# Patient Record
Sex: Female | Born: 1985 | Race: White | Hispanic: No | Marital: Married | Smoking: Current every day smoker
Health system: Southern US, Community
[De-identification: ages and names within clinical notes are randomized; demographics above are authoritative.]

## PROBLEM LIST (undated history)

## (undated) DIAGNOSIS — K589 Irritable bowel syndrome without diarrhea: Secondary | ICD-10-CM

## (undated) DIAGNOSIS — O4593 Premature separation of placenta, unspecified, third trimester: Secondary | ICD-10-CM

## (undated) DIAGNOSIS — O24419 Gestational diabetes mellitus in pregnancy, unspecified control: Secondary | ICD-10-CM

## (undated) DIAGNOSIS — O99345 Other mental disorders complicating the puerperium: Secondary | ICD-10-CM

## (undated) DIAGNOSIS — I4891 Unspecified atrial fibrillation: Secondary | ICD-10-CM

## (undated) DIAGNOSIS — R609 Edema, unspecified: Secondary | ICD-10-CM

## (undated) DIAGNOSIS — M779 Enthesopathy, unspecified: Secondary | ICD-10-CM

## (undated) DIAGNOSIS — S22009A Unspecified fracture of unspecified thoracic vertebra, initial encounter for closed fracture: Secondary | ICD-10-CM

## (undated) DIAGNOSIS — IMO0002 Reserved for concepts with insufficient information to code with codable children: Secondary | ICD-10-CM

## (undated) DIAGNOSIS — I83892 Varicose veins of left lower extremities with other complications: Secondary | ICD-10-CM

## (undated) DIAGNOSIS — O24410 Gestational diabetes mellitus in pregnancy, diet controlled: Secondary | ICD-10-CM

## (undated) DIAGNOSIS — F419 Anxiety disorder, unspecified: Secondary | ICD-10-CM

## (undated) DIAGNOSIS — G4730 Sleep apnea, unspecified: Secondary | ICD-10-CM

## (undated) DIAGNOSIS — F53 Postpartum depression: Secondary | ICD-10-CM

## (undated) DIAGNOSIS — F410 Panic disorder [episodic paroxysmal anxiety] without agoraphobia: Secondary | ICD-10-CM

## (undated) DIAGNOSIS — G8929 Other chronic pain: Secondary | ICD-10-CM

## (undated) DIAGNOSIS — D3500 Benign neoplasm of unspecified adrenal gland: Secondary | ICD-10-CM

## (undated) DIAGNOSIS — G5603 Carpal tunnel syndrome, bilateral upper limbs: Secondary | ICD-10-CM

## (undated) DIAGNOSIS — R079 Chest pain, unspecified: Secondary | ICD-10-CM

## (undated) DIAGNOSIS — G5600 Carpal tunnel syndrome, unspecified upper limb: Secondary | ICD-10-CM

## (undated) DIAGNOSIS — M549 Dorsalgia, unspecified: Secondary | ICD-10-CM

## (undated) DIAGNOSIS — M479 Spondylosis, unspecified: Secondary | ICD-10-CM

## (undated) DIAGNOSIS — I10 Essential (primary) hypertension: Secondary | ICD-10-CM

## (undated) HISTORY — DX: Other chronic pain: G89.29

## (undated) HISTORY — DX: Panic disorder (episodic paroxysmal anxiety): F41.0

## (undated) HISTORY — DX: Other mental disorders complicating the puerperium: O99.345

## (undated) HISTORY — DX: Unspecified atrial fibrillation: I48.91

## (undated) HISTORY — DX: Benign neoplasm of unspecified adrenal gland: D35.00

## (undated) HISTORY — DX: Sleep apnea, unspecified: G47.30

## (undated) HISTORY — DX: Gestational diabetes mellitus in pregnancy, unspecified control: O24.419

## (undated) HISTORY — DX: Irritable bowel syndrome, unspecified: K58.9

## (undated) HISTORY — PX: CHOLECYSTECTOMY: SHX55

## (undated) HISTORY — DX: Enthesopathy, unspecified: M77.9

## (undated) HISTORY — DX: Carpal tunnel syndrome, bilateral upper limbs: G56.03

## (undated) HISTORY — DX: Postpartum depression: F53.0

## (undated) HISTORY — DX: Carpal tunnel syndrome, unspecified upper limb: G56.00

## (undated) HISTORY — DX: Anxiety disorder, unspecified: F41.9

## (undated) HISTORY — DX: Dorsalgia, unspecified: M54.9

## (undated) HISTORY — DX: Chest pain, unspecified: R07.9

---
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
## 2006-12-27 ENCOUNTER — Emergency Department (HOSPITAL_COMMUNITY): Admission: EM | Admit: 2006-12-27 | Discharge: 2006-12-27 | Payer: Self-pay | Admitting: Emergency Medicine

## 2007-08-07 ENCOUNTER — Emergency Department (HOSPITAL_COMMUNITY): Admission: EM | Admit: 2007-08-07 | Discharge: 2007-08-07 | Payer: Self-pay | Admitting: Emergency Medicine

## 2007-10-11 ENCOUNTER — Emergency Department (HOSPITAL_COMMUNITY): Admission: EM | Admit: 2007-10-11 | Discharge: 2007-10-11 | Payer: Self-pay | Admitting: Emergency Medicine

## 2007-12-11 ENCOUNTER — Emergency Department (HOSPITAL_COMMUNITY): Admission: EM | Admit: 2007-12-11 | Discharge: 2007-12-11 | Payer: Self-pay | Admitting: Emergency Medicine

## 2008-05-02 ENCOUNTER — Emergency Department (HOSPITAL_COMMUNITY): Admission: EM | Admit: 2008-05-02 | Discharge: 2008-05-02 | Payer: Self-pay | Admitting: Emergency Medicine

## 2008-09-03 ENCOUNTER — Emergency Department (HOSPITAL_COMMUNITY): Admission: EM | Admit: 2008-09-03 | Discharge: 2008-09-03 | Payer: Self-pay | Admitting: Emergency Medicine

## 2009-07-08 ENCOUNTER — Emergency Department (HOSPITAL_COMMUNITY): Admission: EM | Admit: 2009-07-08 | Discharge: 2009-07-08 | Payer: Self-pay | Admitting: Emergency Medicine

## 2009-08-03 ENCOUNTER — Ambulatory Visit (HOSPITAL_COMMUNITY): Admission: RE | Admit: 2009-08-03 | Discharge: 2009-08-03 | Payer: Self-pay | Admitting: General Surgery

## 2009-08-03 ENCOUNTER — Encounter (INDEPENDENT_AMBULATORY_CARE_PROVIDER_SITE_OTHER): Payer: Self-pay | Admitting: General Surgery

## 2011-04-05 LAB — BASIC METABOLIC PANEL
BUN: 9 mg/dL (ref 6–23)
CO2: 30 mEq/L (ref 19–32)
GFR calc non Af Amer: >60 mL/min (ref >60)
Glucose, Bld: 96 mg/dL (ref 70–99)
Potassium: 4.2 mEq/L (ref 3.5–5.1)

## 2011-04-05 LAB — CBC
HCT: 38 % (ref 36.0–46.0)
Hemoglobin: 13 g/dL (ref 12.0–15.0)
MCHC: 34.3 g/dL (ref 30.0–36.0)
Platelets: 179 10*3/uL (ref 150–400)
RDW: 13.6 % (ref 11.5–15.5)

## 2011-04-06 LAB — URINALYSIS, ROUTINE W REFLEX MICROSCOPIC
Bilirubin Urine: NEGATIVE
Glucose, UA: NEGATIVE mg/dL
Hgb urine dipstick: NEGATIVE
Ketones, ur: NEGATIVE mg/dL
Protein, ur: NEGATIVE mg/dL
pH: 6 (ref 5.0–8.0)

## 2011-04-06 LAB — DIFFERENTIAL
Basophils Absolute: 0 10*3/uL (ref 0.0–0.1)
Basophils Relative: 1 % (ref 0–1)
Lymphocytes Relative: 25 % (ref 12–46)
Monocytes Absolute: 0.5 10*3/uL (ref 0.1–1.0)
Monocytes Relative: 5 % (ref 3–12)
Neutro Abs: 7.3 10*3/uL (ref 1.7–7.7)
Neutrophils Relative %: 67 % (ref 43–77)

## 2011-04-06 LAB — COMPREHENSIVE METABOLIC PANEL
Albumin: 3.6 g/dL (ref 3.5–5.2)
Alkaline Phosphatase: 76 U/L (ref 39–117)
BUN: 8 mg/dL (ref 6–23)
Creatinine, Ser: 0.67 mg/dL (ref 0.4–1.2)
Glucose, Bld: 91 mg/dL (ref 70–99)
Potassium: 4.3 mEq/L (ref 3.5–5.1)
Total Protein: 6.9 g/dL (ref 6.0–8.3)

## 2011-04-06 LAB — CBC
HCT: 38.7 % (ref 36.0–46.0)
Hemoglobin: 13.2 g/dL (ref 12.0–15.0)
MCHC: 34.2 g/dL (ref 30.0–36.0)
MCV: 85 fL (ref 78.0–100.0)
Platelets: 184 10*3/uL (ref 150–400)
RDW: 13.4 % (ref 11.5–15.5)

## 2011-05-13 NOTE — Op Note (Signed)
NAME:  Heidi Landry, Heidi Landry             ACCOUNT NO.:  [id number]   MEDICAL RECORD NO.:  [id number]          PATIENT TYPE:  AMB   LOCATION:  DAY                           FACILITY:  [facility]   PHYSICIAN:  Chelsea Primus, MD      DATE OF BIRTH:  December 20, 1986   DATE OF PROCEDURE:  08/03/2009  DATE OF DISCHARGE:                               OPERATIVE REPORT   PREOPERATIVE DIAGNOSIS:  Cholelithiasis.   POSTOPERATIVE DIAGNOSES:  Cholelithiasis.   PROCEDURE:  Laparoscopic cholecystectomy.   SURGEON:  Chelsea Primus, MD   ANESTHESIA:  General endotracheal local anesthetic 0.5% Sensorcaine  plain.   SPECIMEN:  Gallbladder.   ESTIMATED BLOOD LOSS:  Minimal.   INDICATIONS:  The patient is a 25 year old female who presents to my  office with history of epigastric abdominal pain.  She had pre-  evaluation workup with a right upper quadrant ultrasound demonstrating  cholelithiasis.  Her signs and symptoms were consistent with a biliary  etiology.  Risks, benefits, and alternatives of a laparoscopic possible  open cholecystectomy were discussed at length with the patient including  but not limited to the risk of bleeding, infection, bile leak, small  bowel injury, common bile duct injury, as well as the possibility of  intraoperative cardiac and pulmonary events.  The patient's questions  and concerns were addressed, and the patient consented for the planned  procedure.   OPERATION:  The patient was taken to the operating room, was placed in  supine position on the operating room table, at which time the general  anesthetic was administered.  Once the patient was asleep, she was  endotracheally intubated by Anesthesia.  At this point, her abdomen was  prepped with DuraPrep solution and draped in a standard fashion.  A stab  incision was created supraumbilically with an 63-FHLKT scalpel.  Additional dissection down to the subcuticular tissue was carried out  using a Kocher clamp which was utilized to  grasp the anterior abdominal  fascia and lifted this anteriorly.  A Veress needle was inserted.  Saline drop test was utilized to confirm intraperitoneal placement, and  then pneumoperitoneum was initiated.  Once sufficient pneumoperitoneum  was obtained, an 11-mm trocar was inserted over the laparoscope allowing  visualization of the trocar entering into the peritoneal cavity.  At  this time, the inner cannula removed.  The laparoscope was reinserted.  There was no evidence of any trocar or Veress needle placement injury.  At this time, the remaining trocars were placed with an 11-mm trocar in  the epigastrium, a 5-mm trocar in the midline between two 11-mm trocars  and a 5-mm trocar in the right lateral abdominal wall.  The patient was  placed into a reverse Trendelenburg left lateral decubitus position.  Fundus of the gallbladder was identified, grasped, and lifted up and  over the right lobe of the liver.  Peritoneal dissection was carried out  using a [place] to strip the peritoneum off the infundibulum  exposing the cystic duct and the cystic arteries and turning to the  infundibulum.  A window was created behind both structures.  Two  EndoClips were placed proximally on the cystic artery, one distally and  on the cystic duct.  Three EndoClips were placed proximally, one  distally in the cystic duct, and cystic artery divided between 2 most  distal clips.  At this time, electrocautery was utilized to dissect the  gallbladder free from the gallbladder fossa.  Hemostasis was obtained  during this process with the electrocautery.  The gallbladder was  dissected free using electrocautery.  During this dissection, a small  cholecystotomy was created with a small amount of bile spillage.  There  was no evidence of any stone spillage.  This was quickly controlled.  Once the gallbladder was free, it was placed into an Endo Catch bag.  The spilled bile was quickly aspirated with a  suction irrigator.  The  surgical field was irrigated with copious amount of sterile saline until  the returning aspirate was clear.  At this time, a piece of Surgicel was  placed into the gallbladder fossa and attention was turned to closure.   The patient was placed back into a supine position.  An Endoclose suture  passing device was utilized to pass a 2-0 Vicryl suture through both the  11-mm trocar sites.  With these sutures in place, the gallbladder was  grasped and was retrieved and was pulled through the umbilical trocar  site in an intact Endo Catch bag.  This was placed on the back table and  sent as a permanent specimen to Pathology.  At this time, the  pneumoperitoneum was evacuated.  The Vicryl sutures were secured.  Local  anesthetic was instilled.  A 4-0 Monocryl was utilized to reapproximate  the skin edges at all 4 trocar sites.  Skin was washed and dried with a  moistened dry towel.  Benzoin was applied around the incision.  Half-  inch Steri-Strips were placed.  Drapes were removed.  The patient was  allowed to come out of general aesthetic.  The patient was transferred  back to a regular hospital bed.  She was transferred to the  postanesthetic care unit in stable condition.  At the conclusion of the  procedure, all instrument, sponge, and needle counts were correct.  The  patient tolerated the procedure extremely well.      Chelsea Primus, MD  Electronically Signed     BZ/MEDQ  D:  08/03/2009  T:  08/03/2009  Job:  [id number]

## 2011-05-13 NOTE — H&P (Signed)
NAME:  Heidi Landry, Heidi Landry             ACCOUNT NO.:  [id number]   MEDICAL RECORD NO.:  [id number]         PATIENT TYPE:  PAMB   LOCATION:  DAY                           FACILITY:  [facility]   PHYSICIAN:  Chelsea Primus, MD      DATE OF BIRTH:  Feb 12, 1986   DATE OF ADMISSION:  DATE OF DISCHARGE:  LH                              HISTORY & PHYSICAL   CHIEF COMPLAINT:  Gallstones.   HISTORY OF PRESENT ILLNESS:  The patient is a 25 year old female who  presented to my office with history of right upper quadrant abdominal  pain.  He had no similar symptomatology in the past.  Pain now is  colicky in nature and is referred to the back.  She said to have some  increased pain with movement.  States improvement with symptomatology  with ambulation, however.  She said that she had a steak the night  before the episode as well as served with cheese, potatoes and increased  in her symptomatology.  She has had a history of indigestion.  She does  have a diet full of fatty greasy foods.  She does have a significant  history of bloating, feels gassy, and has flatus.  This has been  persistent over the last several months to years.  She denies any  current nausea and vomiting.  She has had no jaundice.  No changes in  bowel movements.  No melena.  No hematochezia.  No changes in hearing.  No vaginal discharge.   PAST MEDICAL HISTORY:  Obesity.   PAST SURGICAL HISTORY:  None.   MEDICATIONS:  None.   ALLERGIES:  No known drug allergies.   SOCIAL HISTORY:  The patient denies tobacco or alcohol.  The patient's  occupation is unemployed.   PERTINENT FAMILY HISTORY:  Positive biliary disease in the family.   REVIEW OF SYSTEMS:  CONSTITUTIONAL:  Unremarkable.  EYES:  Unremarkable.  EARS, NOSE, AND THROAT:  Occasional rhinorrhea.  RESPIRATORY:  Occasional cough.  CARDIOVASCULAR:  Unremarkable.  GASTROINTESTINAL:  Nausea occasional as well as abdominal pain as per HPI.  GENITOURINARY:  Unremarkable.   MUSCULOSKELETAL:  Arthralgias to the back.  Skin,  endocrine, and neurologic were all unremarkable.   PHYSICAL EXAMINATION:  GENERAL:  The patient is a morbidly obese female.  She is calm in her appearance.  She is not in acute distress.  She is  alert and oriented x3.  HEENT:  Scalp no deformities.  No masses.  Eyes:  Pupils are equal,  round, and reactive.  Extraocular movements were intact.  No scleral  icterus or conjunctival pallor.  Oral mucosa is pink.  Normal occlusion.  NECK:  Trachea is midline.  No cervical lymphadenopathy.  No thyroid  nodules or goiters noted.  PULMONARY:  Unlabored respiration.  She has no wheezing.  No crackles.  She is clear to auscultation in the left and right lungs fields.  CARDIOVASCULAR:  Regular rate and rhythm.  She has 2+ radial and femoral  pulses bilaterally, 2+ dorsalis pedis pulses bilaterally.  ABDOMEN:  Positive bowel sounds.  Abdomen is soft, nontender.  No  hernias.  No masses are noted, although the exam is somewhat limited due  to the patient's obesity.  SKIN:  Warm and dry.   PERTINENT LABORATORY AND RADIOGRAPHIC STUDIES:  Right upper quadrant  ultrasound obtained at [facility] did demonstrate positive  gallstones.  There is no gallbladder wall thickening.  No  pericholecystic fluid.   ASSESSMENT/PLAN:  Cholelithiasis.  At this time, I did discuss with the  patient the pathophysiology of cholelithiasis.  In addition, her  symptomatology clinically appear to be related to her gallbladder  disease.  Risks, benefits, and alternatives of laparoscopic possible  open cholecystectomy are discussed with the patient including, but not  limited risk of bleeding, infection, bile leak, small-bowel injury,  common bile duct injury as well as the possibility of intraoperative  cardiac and pulmonary events.  The patient's questions and concerns were  addressed and the patient will be consented for planned procedure at her  earliest  convenience.  In addition, I did discuss this with the patient  the possibility of some of the symptomatology persisting postoperatively  and in that case, we will proceed with upper endoscopy; however,  additional etiologies of her abdominal pain are very low in suspicion at  this point and would only proceed in the postoperative period should she  continue to have any symptomatology.  She does understand this and does  wish to proceed at this time with the planned cholecystectomy.      Chelsea Primus, MD  Electronically Signed     BZ/MEDQ  D:  08/02/2009  T:  08/03/2009  Job:  [id number]   cc:   Short-Stay Surgery

## 2011-10-06 LAB — URINALYSIS, ROUTINE W REFLEX MICROSCOPIC
Ketones, ur: NEGATIVE
Nitrite: NEGATIVE
Protein, ur: NEGATIVE
pH: 6.5

## 2011-10-06 LAB — CBC
Platelets: 222
RDW: 13.4
WBC: 11.1 — ABNORMAL HIGH

## 2011-10-06 LAB — PREGNANCY, URINE: Preg Test, Ur: NEGATIVE

## 2011-10-13 LAB — URINALYSIS, ROUTINE W REFLEX MICROSCOPIC
Nitrite: NEGATIVE
Protein, ur: NEGATIVE
Specific Gravity, Urine: 1.015
Urobilinogen, UA: 0.2

## 2012-02-20 ENCOUNTER — Emergency Department (HOSPITAL_COMMUNITY)
Admission: EM | Admit: 2012-02-20 | Discharge: 2012-02-20 | Disposition: A | Discharge status: Home / Self Care | Payer: Self-pay | Attending: Emergency Medicine | Admitting: Emergency Medicine

## 2012-02-20 ENCOUNTER — Encounter (HOSPITAL_COMMUNITY): Payer: Self-pay | Admitting: *Deleted

## 2012-02-20 ENCOUNTER — Emergency Department (HOSPITAL_COMMUNITY): Payer: Self-pay

## 2012-02-20 DIAGNOSIS — F17200 Nicotine dependence, unspecified, uncomplicated: Secondary | ICD-10-CM | POA: Insufficient documentation

## 2012-02-20 DIAGNOSIS — X58XXXA Exposure to other specified factors, initial encounter: Secondary | ICD-10-CM | POA: Insufficient documentation

## 2012-02-20 DIAGNOSIS — S86819A Strain of other muscle(s) and tendon(s) at lower leg level, unspecified leg, initial encounter: Secondary | ICD-10-CM | POA: Insufficient documentation

## 2012-02-20 DIAGNOSIS — T148XXA Other injury of unspecified body region, initial encounter: Secondary | ICD-10-CM

## 2012-02-20 DIAGNOSIS — S838X9A Sprain of other specified parts of unspecified knee, initial encounter: Secondary | ICD-10-CM | POA: Insufficient documentation

## 2012-02-20 DIAGNOSIS — I10 Essential (primary) hypertension: Secondary | ICD-10-CM | POA: Insufficient documentation

## 2012-02-20 HISTORY — DX: Essential (primary) hypertension: I10

## 2012-02-20 MED ORDER — IBUPROFEN 800 MG PO TABS
800.0000 mg | ORAL_TABLET | Freq: Once | ORAL | Status: AC
  Administered 2012-02-20: 800 mg via ORAL
  Filled 2012-02-20: qty 1

## 2012-02-20 NOTE — ED Provider Notes (Signed)
Medical screening examination/treatment/procedure(s) were performed by non-physician practitioner and as supervising physician I was immediately available for consultation/collaboration.   Carmin Muskrat, MD 02/20/12 2256

## 2012-02-20 NOTE — ED Notes (Signed)
Pain in left calf

## 2012-02-20 NOTE — ED Notes (Signed)
PT DC TO HOME WITH STEADY GAIT.

## 2012-02-20 NOTE — Discharge Instructions (Signed)
Cryotherapy Cryotherapy means treatment with cold. Ice or gel packs can be used to reduce both pain and swelling. Ice is the most helpful within the first 24 to 48 hours after an injury or flareup from overusing a muscle or joint. Sprains, strains, spasms, burning pain, shooting pain, and aches can all be eased with ice. Ice can also be used when recovering from surgery. Ice is effective, has very few side effects, and is safe for most people to use. PRECAUTIONS  Ice is not a safe treatment option for people with:  Raynaud's phenomenon. This is a condition affecting small blood vessels in the extremities. Exposure to cold may cause your problems to return.   Cold hypersensitivity. There are many forms of cold hypersensitivity, including:   Cold urticaria. Red, itchy hives appear on the skin when the tissues begin to warm after being iced.   Cold erythema. This is a red, itchy rash caused by exposure to cold.   Cold hemoglobinuria. Red blood cells break down when the tissues begin to warm after being iced. The hemoglobin that carry oxygen are passed into the urine because they cannot combine with blood proteins fast enough.   Numbness or altered sensitivity in the area being iced.  If you have any of the following conditions, do not use ice until you have discussed cryotherapy with your caregiver:  Heart conditions, such as arrhythmia, angina, or chronic heart disease.   High blood pressure.   Healing wounds or open skin in the area being iced.   Current infections.   Rheumatoid arthritis.   Poor circulation.   Diabetes.  Ice slows the blood flow in the region it is applied. This is beneficial when trying to stop inflamed tissues from spreading irritating chemicals to surrounding tissues. However, if you expose your skin to cold temperatures for too long or without the proper protection, you can damage your skin or nerves. Watch for signs of skin damage due to cold. HOME CARE  INSTRUCTIONS Follow these tips to use ice and cold packs safely.  Place a dry or damp towel between the ice and skin. A damp towel will cool the skin more quickly, so you may need to shorten the time that the ice is used.   For a more rapid response, add gentle compression to the ice.   Ice for no more than 10 to 20 minutes at a time. The bonier the area you are icing, the less time it will take to get the benefits of ice.   Check your skin after 5 minutes to make sure there are no signs of a poor response to cold or skin damage.   Rest 20 minutes or more in between uses.   Once your skin is numb, you can end your treatment. You can test numbness by very lightly touching your skin. The touch should be so light that you do not see the skin dimple from the pressure of your fingertip. When using ice, most people will feel these normal sensations in this order: cold, burning, aching, and numbness.   Do not use ice on someone who cannot communicate their responses to pain, such as small children or people with dementia.  HOW TO MAKE AN ICE PACK Ice packs are the most common way to use ice therapy. Other methods include ice massage, ice baths, and cryo-sprays. Muscle creams that cause a cold, tingly feeling do not offer the same benefits that ice offers and should not be used as a substitute  unless recommended by your caregiver. To make an ice pack, do one of the following:  Place crushed ice or a bag of frozen vegetables in a sealable plastic bag. Squeeze out the excess air. Place this bag inside another plastic bag. Slide the bag into a pillowcase or place a damp towel between your skin and the bag.   Mix 3 parts water with 1 part rubbing alcohol. Freeze the mixture in a sealable plastic bag. When you remove the mixture from the freezer, it will be slushy. Squeeze out the excess air. Place this bag inside another plastic bag. Slide the bag into a pillowcase or place a damp towel between your skin  and the bag.  SEEK MEDICAL CARE IF:  You develop white spots on your skin. This may give the skin a blotchy (mottled) appearance.   Your skin turns blue or pale.   Your skin becomes waxy or hard.   Your swelling gets worse.  MAKE SURE YOU:   Understand these instructions.   Will watch your condition.   Will get help right away if you are not doing well or get worse.  Document Released: 08/11/2011 Document Reviewed: 08/07/2011 [facility] Patient Information 2012 [place].   You most likely have a small muscle tear in the calf muscle.  Apply ice 10-15 min several times daily.  During the day use the ace wrap for comfort.  Take ibuprofen 800 mg every 8 hrs with food.  Follow up with one of the orthopedists as needed.

## 2012-02-20 NOTE — ED Provider Notes (Signed)
History     CSN: [id number]  Arrival date & time 02/20/12  1707   First MD Initiated Contact with Patient 02/20/12 1822      Chief Complaint  Patient presents with  . Leg Pain    (Consider location/radiation/quality/duration/timing/severity/associated sxs/prior treatment) HPI Comments: States she felt a "pop" in her L proximal calf while putting her socks on and has had pain since then. She also states she hasn't worked in a couple years and started a new job in January that requires her being on her feet most all day.  Patient is a 26 y.o. female presenting with leg pain. The history is provided by the patient. No language interpreter was used.  Leg Pain  Incident onset: this AM. The incident occurred at home. There was no injury mechanism. The pain is present in the left knee. The pain is moderate. The pain has been constant since onset. Associated symptoms include inability to bear weight. She reports no foreign bodies present. The symptoms are aggravated by bearing weight. She has tried nothing for the symptoms.    Past Medical History  Diagnosis Date  . Hypertension     Past Surgical History  Procedure Date  . Cholecystectomy     No family history on file.  History  Substance Use Topics  . Smoking status: Current Everyday Smoker -- 1.0 packs/day  . Smokeless tobacco: Not on file  . Alcohol Use: No    OB History    Grav Para Term Preterm Abortions TAB SAB Ect Mult Living                  Review of Systems  Musculoskeletal:       Leg pain   All other systems reviewed and are negative.    Allergies  Review of patient's allergies indicates no known allergies.  Home Medications   Current Outpatient Rx  Name Route Sig Dispense Refill  . HYDROCHLOROTHIAZIDE 25 MG PO TABS Oral Take 25 mg by mouth daily.    Marland Kitchen METOPROLOL SUCCINATE ER 25 MG PO TB24 Oral Take 25 mg by mouth 2 (two) times daily.      BP 129/60  Pulse 98  Temp(Src) 97.3 F (36.3 C) (Oral)   Resp 20  Ht 5' 2"  (1.575 m)  Wt 340 lb (154.223 kg)  BMI 62.19 kg/m2  SpO2 98%  LMP 01/25/2012  Physical Exam  Nursing note and vitals reviewed. Constitutional: She is oriented to person, place, and time. She appears well-developed and well-nourished. No distress.  HENT:  Head: Normocephalic and atraumatic.  Eyes: EOM are normal.  Neck: Normal range of motion.  Cardiovascular: Normal rate, regular rhythm and normal heart sounds.   Pulmonary/Chest: Effort normal and breath sounds normal.  Abdominal: Soft. She exhibits no distension. There is no tenderness.  Musculoskeletal: Normal range of motion.       Legs: Neurological: She is alert and oriented to person, place, and time.  Skin: Skin is warm and dry.  Psychiatric: She has a normal mood and affect. Judgment normal.    ED Course  Procedures (including critical care time)  Labs Reviewed - No data to display No results found.   No diagnosis found.    MDM     Results for orders placed during the hospital encounter of 09/32/35  BASIC METABOLIC PANEL      Component Value Range   Sodium 140  135 - 145 (mEq/L)   Potassium 4.2  3.5 - 5.1 (mEq/L)  Chloride 106  96 - 112 (mEq/L)   CO2 30  19 - 32 (mEq/L)   Glucose, Bld 96  70 - 99 (mg/dL)   BUN 9  6 - 23 (mg/dL)   Creatinine, Ser 0.67  0.4 - 1.2 (mg/dL)   Calcium 9.0  8.4 - 10.5 (mg/dL)   GFR calc non Af Amer >60  >60 (mL/min)   GFR calc Af Amer    >60 (mL/min)   Value: >60            The eGFR has been calculated     using the MDRD equation.     This calculation has not been     validated in all clinical     situations.     eGFR's persistently     <60 mL/min signify     possible Chronic Kidney Disease.  CBC      Component Value Range   WBC 9.8  4.0 - 10.5 (K/uL)   RBC 4.50  3.87 - 5.11 (MIL/uL)   Hemoglobin 13.0  12.0 - 15.0 (g/dL)   HCT 38.0  36.0 - 46.0 (%)   MCV 84.4  78.0 - 100.0 (fL)   MCHC 34.3  30.0 - 36.0 (g/dL)   RDW 13.6  11.5 - 15.5 (%)    Platelets 179  150 - 400 (K/uL)  HCG, QUANTITATIVE, PREGNANCY      Component Value Range   hCG, Beta Chain, Quant, S    <5 (mIU/mL)   Value: <2              GEST. AGE      CONC.  (mIU/mL)       <=1 WEEK        5 - 50         2 WEEKS       50 - 500         3 WEEKS       100 - 10,000         4 WEEKS     1,000 - 30,000         5 WEEKS     3,500 - 115,000       6-8 WEEKS     12,000 - 270,000        12 WEEKS     15,000 - 220,000                FEMALE AND NON-PREGNANT FEMALE:         LESS THAN 5 mIU/mL   Dg Knee Complete 4 Views Left  02/20/2012  *RADIOLOGY REPORT*  Clinical Data: Lateral and posterior knee pain and upper calf pain after audible pop earlier today.  LEFT KNEE - COMPLETE 4+ VIEW 1013:  Comparison: None.  Findings: No evidence of acute, subacute, or healed fractures. Well-preserved joint spaces.  No intrinsic osseous abnormalities. No evidence of a significant joint effusion.  IMPRESSION: Normal examination.  Original Report Authenticated By: Deniece Portela, M.D.        Duaine Dredge, PA 02/20/12 2015

## 2012-04-08 ENCOUNTER — Emergency Department (HOSPITAL_COMMUNITY): Payer: Self-pay

## 2012-04-08 ENCOUNTER — Encounter (HOSPITAL_COMMUNITY): Payer: Self-pay | Admitting: Emergency Medicine

## 2012-04-08 ENCOUNTER — Emergency Department (HOSPITAL_COMMUNITY)
Admission: EM | Admit: 2012-04-08 | Discharge: 2012-04-08 | Disposition: A | Discharge status: Home / Self Care | Payer: Self-pay | Attending: Emergency Medicine | Admitting: Emergency Medicine

## 2012-04-08 DIAGNOSIS — I10 Essential (primary) hypertension: Secondary | ICD-10-CM | POA: Insufficient documentation

## 2012-04-08 DIAGNOSIS — S22000A Wedge compression fracture of unspecified thoracic vertebra, initial encounter for closed fracture: Secondary | ICD-10-CM

## 2012-04-08 DIAGNOSIS — Z79899 Other long term (current) drug therapy: Secondary | ICD-10-CM | POA: Insufficient documentation

## 2012-04-08 DIAGNOSIS — M546 Pain in thoracic spine: Secondary | ICD-10-CM | POA: Insufficient documentation

## 2012-04-08 DIAGNOSIS — W1789XA Other fall from one level to another, initial encounter: Secondary | ICD-10-CM | POA: Insufficient documentation

## 2012-04-08 HISTORY — DX: Edema, unspecified: R60.9

## 2012-04-08 MED ORDER — HYDROCODONE-ACETAMINOPHEN 5-325 MG PO TABS
2.0000 | ORAL_TABLET | Freq: Once | ORAL | Status: AC
  Administered 2012-04-08: 2 via ORAL
  Filled 2012-04-08: qty 2

## 2012-04-08 MED ORDER — IBUPROFEN 800 MG PO TABS
800.0000 mg | ORAL_TABLET | Freq: Once | ORAL | Status: AC
  Administered 2012-04-08: 800 mg via ORAL
  Filled 2012-04-08: qty 1

## 2012-04-08 MED ORDER — ONDANSETRON 4 MG PO TBDP
4.0000 mg | ORAL_TABLET | Freq: Once | ORAL | Status: AC
  Administered 2012-04-08: 4 mg via ORAL
  Filled 2012-04-08: qty 1

## 2012-04-08 MED ORDER — SODIUM CHLORIDE 0.9 % IV SOLN
Freq: Once | INTRAVENOUS | Status: AC
  Administered 2012-04-08: 11:00:00 via INTRAVENOUS

## 2012-04-08 MED ORDER — HYDROCODONE-ACETAMINOPHEN 7.5-325 MG PO TABS: 1.0000 | ORAL_TABLET | ORAL | Status: DC | PRN

## 2012-04-08 MED ORDER — METHOCARBAMOL 500 MG PO TABS
1000.0000 mg | ORAL_TABLET | Freq: Once | ORAL | Status: AC
  Administered 2012-04-08: 1000 mg via ORAL
  Filled 2012-04-08: qty 2

## 2012-04-08 MED ORDER — DIAZEPAM 5 MG PO TABS
5.0000 mg | ORAL_TABLET | Freq: Once | ORAL | Status: AC
  Administered 2012-04-08: 5 mg via ORAL
  Filled 2012-04-08: qty 1

## 2012-04-08 MED ORDER — HYDROMORPHONE HCL PF 1 MG/ML IJ SOLN
1.0000 mg | Freq: Once | INTRAMUSCULAR | Status: AC
  Administered 2012-04-08: 1 mg via INTRAVENOUS
  Filled 2012-04-08: qty 1

## 2012-04-08 MED ORDER — BACLOFEN 10 MG PO TABS: 10.0000 mg | ORAL_TABLET | Freq: Three times a day (TID) | ORAL | Status: DC

## 2012-04-08 NOTE — ED Provider Notes (Signed)
History     CSN: [id number]  Arrival date & time 04/08/12  6578   First MD Initiated Contact with Patient 04/08/12 [phone]      Chief Complaint  Patient presents with  . Fall  . Back Pain    (Consider location/radiation/quality/duration/timing/severity/associated sxs/prior treatment) Patient is a 26 y.o. female presenting with fall and back pain. The history is provided by the patient.  Fall The accident occurred 1 to 2 hours ago.  Back Pain     Past Medical History  Diagnosis Date  . Hypertension   . Edema     Past Surgical History  Procedure Date  . Cholecystectomy     Family History  Problem Relation Age of Onset  . Cancer Other   . Hypertension Other   . Thyroid disease Other   . Hyperlipidemia Other     History  Substance Use Topics  . Smoking status: Current Everyday Smoker -- 0.5 packs/day for 4 years    Types: Cigarettes  . Smokeless tobacco: Never Used  . Alcohol Use: No    OB History    Grav Para Term Preterm Abortions TAB SAB Ect Mult Living            0      Review of Systems  Musculoskeletal: Positive for back pain.    Allergies  Review of patient's allergies indicates no known allergies.  Home Medications   Current Outpatient Rx  Name Route Sig Dispense Refill  . HYDROCHLOROTHIAZIDE 25 MG PO TABS Oral Take 25 mg by mouth daily.    Marland Kitchen METOPROLOL SUCCINATE ER 25 MG PO TB24 Oral Take 25 mg by mouth 2 (two) times daily.      BP 132/77  Pulse 103  Temp(Src) 98.1 F (36.7 C) (Oral)  Resp 22  Ht 5' 2"  (1.575 m)  Wt 350 lb (158.759 kg)  BMI 64.02 kg/m2  SpO2 94%  LMP 02/13/2012  Physical Exam  Constitutional: She appears well-developed and well-nourished.       Patient on long spine board  Neck:       C-spine immobilized, trachea is in the midline. No stridor noted.  Cardiovascular: Tachycardia present.   Pulmonary/Chest:       No right rib area deformity, bruising, or significant pain to palpation. No sternal tenderness.  Chaperone present during exam.  Musculoskeletal:       Right shoulder: She exhibits normal range of motion and no tenderness.       Right hip: She exhibits normal range of motion, no tenderness and no deformity.       Left hip: She exhibits normal range of motion, no tenderness and no deformity.       Thoracic back: She exhibits tenderness. She exhibits no deformity.       Back:    ED Course  Procedures (including critical care time)  Labs Reviewed - No data to display No results found.   No diagnosis found.    MDM  I have reviewed nursing notes, vital signs, and all appropriate lab and imaging results for this patient. Pt continues to have pain after returning from xray. Valium and norco given. C-Spine, chest, and pelvis xrays are negative to limited evaluation. There is a 25% T12 compression fracture present. Test results given to patient. Pt strongly advised to stop smoking, and to see Dr Aline Brochure for follow up of the compression fracture.       Lenox Ahr, [place] 04/08/12 1157

## 2012-04-08 NOTE — ED Notes (Addendum)
Patient brought in via EMS. Alert and oriented. Airway patent. Patient c/o mid to lower back pain with numbness. Per EMS patient fell from deck approx 5 feet. Patient landed on back. Denies hitting head or LOC. Per patient hurts to take deep breath. Patient placed on oxygen via [place] by EMS. Room air sat 82, O2 sat with 3 liters via [place] 94%. Patient given 32m dilaudid IM by EMS.

## 2012-04-08 NOTE — ED Notes (Signed)
Pt in xray at current.

## 2012-04-08 NOTE — Discharge Instructions (Signed)
Your test reveal a compression fracture of the #12 Thoracic spine. Please apply ice today and tonight, then apply heat. Use tylenol or motrin for mild pain use Norco for more severe pain. Baclofen daily for spasm around the affected area. These medications may cause drowsiness. Please see Dr Aline Brochure for additionalBack, Compression Fracture A compression fracture happens when a force is put upon the length of your spine. Slipping and falling on your bottom are examples of such a force. When this happens, sometimes the force is great enough to compress the building blocks (vertebral bodies) of your spine. Although this causes a lot of pain, this can usually be treated at home, unless your caregiver feels hospitalization is needed for pain control. Your backbone (spinal column) is made up of 24 main vertebral bodies in addition to the sacrum and coccyx (see illustration). These are held together by tough fibrous tissues (ligaments) and by support of your muscles. Nerve roots pass through the openings between the vertebrae. A sudden wrenching move, injury, or a fall may cause a compression fracture of one of the vertebral bodies. This may result in back pain or spread of pain into the belly (abdomen), the buttocks, and down the leg into the foot. Pain may also be created by muscle spasm alone. Large studies have been undertaken to determine the best possible course of action to help your back following injury and also to prevent future problems. The recommendations are as follows. FOLLOWING A COMPRESSION FRACTURE: Do the following only if advised by your caregiver.   If a back brace has been suggested or provided, wear it as directed.   DO NOT stop wearing the back brace unless instructed by your caregiver.   When allowed to return to regular activities, avoid a sedentary life style. Actively exercise. Sporadic weekend binges of tennis, racquetball, water skiing, may actually aggravate or create problems,  especially if you are not in condition for that activity.   Avoid sports requiring sudden body movements until you are in condition for them. Swimming and walking are safer activities.   Maintain good posture.   Avoid obesity.   If not already done, you should have a DEXA scan. Based on the results, be treated for osteoporosis.  FOLLOWING ACUTE (SUDDEN) INJURY:  Only take over-the-counter or prescription medicines for pain, discomfort, or fever as directed by your caregiver.   Use bed rest for only the most extreme acute episode. Prolonged bed rest may aggravate your condition. Ice used for acute conditions is effective. Use a large plastic bag filled with ice. Wrap it in a towel. This also provides excellent pain relief. This may be continuous. Or use it for 30 minutes every 2 hours during acute phase, then as needed. Heat for 30 minutes prior to activities is helpful.   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 Back injuries can cause potentially marked changes in lifestyle. So it is important to attack these problems aggressively.   See your caregiver for continued problems. He or she can help or refer you for appropriate exercises, physical therapy and work hardening if needed.   If you are given narcotic medications for your condition, for the next 24 hours DO NOT:   Drive   Operate machinery or power tools.   Sign legal documents.   DO NOT drink alcohol, take sleeping pills or  other medications that may interfere with treatment.  If your caregiver has given you a follow-up appointment, it is very important to keep that appointment. Not keeping the appointment could result in a chronic or permanent injury, pain, and disability. If there is any problem keeping the appointment, you must call back to this facility for assistance.  SEEK IMMEDIATE  MEDICAL CARE IF:  You develop numbness, tingling, weakness, or problems with the use of your arms or legs.   You develop severe back pain not relieved with medications.   You have changes in bowel or bladder control.   You have increasing pain in any areas of the body.  Document Released: 12/15/2005 Document Revised: 12/04/2011 Document Reviewed: 07/19/2008 [facility] Patient Information 2012 [place], [place]. evaluation of this fracture.

## 2012-04-13 ENCOUNTER — Encounter: Payer: Self-pay | Admitting: Orthopedic Surgery

## 2012-04-13 ENCOUNTER — Ambulatory Visit (INDEPENDENT_AMBULATORY_CARE_PROVIDER_SITE_OTHER): Payer: Self-pay | Admitting: Orthopedic Surgery

## 2012-04-13 VITALS — BP 104/62 | Ht 62.0 in | Wt 350.0 lb

## 2012-04-13 DIAGNOSIS — S22009A Unspecified fracture of unspecified thoracic vertebra, initial encounter for closed fracture: Secondary | ICD-10-CM

## 2012-04-13 HISTORY — DX: Unspecified fracture of unspecified thoracic vertebra, initial encounter for closed fracture: S22.009A

## 2012-04-13 MED ORDER — HYDROCODONE-ACETAMINOPHEN 7.5-325 MG PO TABS: 1.0000 | ORAL_TABLET | ORAL | Status: AC | PRN

## 2012-04-13 NOTE — Progress Notes (Signed)
  Subjective:    Heidi Landry is a 26 y.o. female with a chief complaint of crushed vertebrae and back  The patient was admitted on April 11. She fell off of a deck. She was evaluated in the emergency room and found to have a compression fracture of thoracic vertebrae. Vertebra, #12, has 25% age-indeterminate compression deformity in the area of tenderness.  Symptoms include sharp throbbing, burning, constant pain, morning and night, worse with standing, associated with some tingling and numbness in the LEFT upper extremity and she reports catching, though she didn't say where she reports that her back is swollen.  Review of systems she reports weight gain, chest pain, palpitations, shortness of breath, and pain on inspiration, worse since the injury. She also reports numbness and tingling in the LEFT upper extremity and swelling of her back.  Her past family, social history have been recorded and reviewed.  BP 104/62  Ht 5' 2"  (1.575 m)  Wt 350 lb (158.759 kg)  BMI 64.02 kg/m2  LMP 02/13/2012  Vital signs are stable as recorded  General appearance is normal, with this. She is definitely obese  The patient is alert and oriented x3  The patient's mood and affect are normal  Gait assessment: normal but labored The cardiovascular exam reveals normal pulses and temperature without edema swelling.  The lymphatic system is negative for palpable lymph nodes  The sensory exam is normal.  There are no pathologic reflexes.  Balance is normal.   Exam of the vertebrae shows tenderness in the state and the T12 region. There is kyphotic deformity. Overall, unrelated to the fracture. She has decreased range of motion in the lumbar spine.  Her upper extremities show normal range of motion, strength, and stability, and alignment Skin normal  X-rays were reviewed from the hospital include chest film and thoracic spine film. She has a thoracic compression fracture.  Plan she is not  brace able without custom-made brace, which she can afford. She is uninsured.  She will be treated with pain medication and recumbent. Activity. She is advised to go to the emergency room. She has any current trouble with raising. She is advised several times during the visit.

## 2012-04-13 NOTE — Patient Instructions (Signed)
Out of work 6 weeks   F/u appt 6 weeks   X-rays at the hospital day before   Go to ER if you are having breathing problems

## 2012-04-19 ENCOUNTER — Telehealth: Payer: Self-pay | Admitting: Orthopedic Surgery

## 2012-04-19 NOTE — Telephone Encounter (Signed)
Patient called to relay that she is hurting more, pain seems to be radiating from back around to chest.  States she has been moving a little more, and may have twisted a little to the side.  States her family members that have been helping her are now needing to help her grandmother, therefore, she has had to do a little more for herself.  She states  is getting some relief with her pain medication however is worried.  Her next scheduled appointment is 05/26/12.  If any recommendations other than limiting activity and following pain medication regimen, please advise.  Ph# is [phone].

## 2012-04-20 NOTE — Telephone Encounter (Signed)
Called back to patient, relayed.

## 2012-04-20 NOTE — Telephone Encounter (Signed)
IF CONCERNED RETURN TO er

## 2012-04-22 ENCOUNTER — Other Ambulatory Visit: Payer: Self-pay | Admitting: *Deleted

## 2012-04-22 MED ORDER — BACLOFEN 10 MG PO TABS: 10.0000 mg | ORAL_TABLET | Freq: Three times a day (TID) | ORAL | Status: AC

## 2012-04-24 NOTE — ED Provider Notes (Signed)
Evaluation and management procedures were performed by the PA/NP/resident physician under my supervision/collaboration.   Charlena Cross, MD 04/24/12 [phone]

## 2012-05-12 ENCOUNTER — Encounter (HOSPITAL_COMMUNITY): Payer: Self-pay | Admitting: *Deleted

## 2012-05-12 ENCOUNTER — Emergency Department (HOSPITAL_COMMUNITY)
Admission: EM | Admit: 2012-05-12 | Discharge: 2012-05-12 | Disposition: A | Discharge status: Home / Self Care | Payer: Self-pay | Attending: Emergency Medicine | Admitting: Emergency Medicine

## 2012-05-12 ENCOUNTER — Emergency Department (HOSPITAL_COMMUNITY): Payer: Self-pay

## 2012-05-12 DIAGNOSIS — F17200 Nicotine dependence, unspecified, uncomplicated: Secondary | ICD-10-CM | POA: Insufficient documentation

## 2012-05-12 DIAGNOSIS — W1789XA Other fall from one level to another, initial encounter: Secondary | ICD-10-CM | POA: Insufficient documentation

## 2012-05-12 DIAGNOSIS — S2232XA Fracture of one rib, left side, initial encounter for closed fracture: Secondary | ICD-10-CM

## 2012-05-12 DIAGNOSIS — S2239XA Fracture of one rib, unspecified side, initial encounter for closed fracture: Secondary | ICD-10-CM | POA: Insufficient documentation

## 2012-05-12 DIAGNOSIS — I10 Essential (primary) hypertension: Secondary | ICD-10-CM | POA: Insufficient documentation

## 2012-05-12 DIAGNOSIS — R609 Edema, unspecified: Secondary | ICD-10-CM | POA: Insufficient documentation

## 2012-05-12 DIAGNOSIS — R079 Chest pain, unspecified: Secondary | ICD-10-CM | POA: Insufficient documentation

## 2012-05-12 MED ORDER — OXYCODONE-ACETAMINOPHEN 7.5-325 MG PO TABS: 1.0000 | ORAL_TABLET | ORAL | Status: AC | PRN

## 2012-05-12 NOTE — ED Notes (Signed)
Pt c/o pain in her left ribs since April 16 th. States that it got better but has gotten worse again. C/o pain with deep breathing and coughing.

## 2012-05-12 NOTE — Discharge Instructions (Signed)
Rib Fracture Your caregiver has diagnosed you as having a rib fracture (a break). This can occur by a blow to the chest, by a fall against a hard object, or by violent coughing or sneezing. There may be one or many breaks. Rib fractures may heal on their own within 3 to 8 weeks. The longer healing period is usually associated with a continued cough or other aggravating activities. HOME CARE INSTRUCTIONS   Avoid strenuous activity. Be careful during activities and avoid bumping the injured rib. Activities that cause pain pull on the fracture site(s) and are best avoided if possible.   Eat a normal, well-balanced diet. Drink plenty of fluids to avoid constipation.   Take deep breaths several times a day to keep lungs free of infection. Try to cough several times a day, splinting the injured area with a pillow. This will help prevent pneumonia.   Do not wear a rib belt or binder. These restrict breathing which can lead to pneumonia.   Only take over-the-counter or prescription medicines for pain, discomfort, or fever as directed by your caregiver.  SEEK MEDICAL CARE IF:  You develop a continual cough, associated with thick or bloody sputum. SEEK IMMEDIATE MEDICAL CARE IF:   You have a fever.   You have difficulty breathing.   You have nausea (feeling sick to your stomach), vomiting, or abdominal (belly) pain.   You have worsening pain, not controlled with medications.  Document Released: 12/15/2005 Document Revised: 12/04/2011 Document Reviewed: 05/19/2007 [facility] Patient Information 2012 [place], [place].  Acetaminophen; Oxycodone tablets What is this medicine? ACETAMINOPHEN; OXYCODONE (a set a MEE noe fen; ox i KOE done) is a pain reliever. It is used to treat mild to moderate pain. This medicine may be used for other purposes; ask your health care provider or pharmacist if you have questions. What should I tell my health care provider before I take this medicine? They need to know if  you have any of these conditions: -brain tumor -Crohn's disease, inflammatory bowel disease, or ulcerative colitis -drink more than 3 alcohol containing drinks per day -drug abuse or addiction -head injury -heart or circulation problems -kidney disease or problems going to the bathroom -liver disease -lung disease, asthma, or breathing problems -an unusual or allergic reaction to acetaminophen, oxycodone, other opioid analgesics, other medicines, foods, dyes, or preservatives -pregnant or trying to get pregnant -breast-feeding How should I use this medicine? Take this medicine by mouth with a full glass of water. Follow the directions on the prescription label. Take your medicine at regular intervals. Do not take your medicine more often than directed. Talk to your pediatrician regarding the use of this medicine in children. Special care may be needed. Patients over 43 years old may have a stronger reaction and need a smaller dose. Overdosage: If you think you have taken too much of this medicine contact a poison control center or emergency room at once. NOTE: This medicine is only for you. Do not share this medicine with others. What if I miss a dose? If you miss a dose, take it as soon as you can. If it is almost time for your next dose, take only that dose. Do not take double or extra doses. What may interact with this medicine? -alcohol or medicines that contain alcohol -antihistamines -barbiturates like amobarbital, butalbital, butabarbital, methohexital, pentobarbital, phenobarbital, thiopental, and secobarbital -benztropine -drugs for bladder problems like solifenacin, trospium, oxybutynin, tolterodine, hyoscyamine, and methscopolamine -drugs for breathing problems like ipratropium and tiotropium -drugs for  certain stomach or intestine problems like propantheline, homatropine methylbromide, glycopyrrolate, atropine, belladonna, and dicyclomine -general anesthetics like etomidate,  ketamine, nitrous oxide, propofol, desflurane, enflurane, halothane, isoflurane, and sevoflurane -medicines for depression, anxiety, or psychotic disturbances -medicines for pain like codeine, morphine, pentazocine, buprenorphine, butorphanol, nalbuphine, tramadol, and propoxyphene -medicines for sleep -muscle relaxants -naltrexone -phenothiazines like perphenazine, thioridazine, chlorpromazine, mesoridazine, fluphenazine, prochlorperazine, promazine, and trifluoperazine -scopolamine -trihexyphenidyl This list may not describe all possible interactions. Give your health care provider a list of all the medicines, herbs, non-prescription drugs, or dietary supplements you use. Also tell them if you smoke, drink alcohol, or use illegal drugs. Some items may interact with your medicine. What should I watch for while using this medicine? Tell your doctor or health care professional if your pain does not go away, if it gets worse, or if you have new or a different type of pain. You may develop tolerance to the medicine. Tolerance means that you will need a higher dose of the medication for pain relief. Tolerance is normal and is expected if you take this medicine for a long time. Do not suddenly stop taking your medicine because you may develop a severe reaction. Your body becomes used to the medicine. This does NOT mean you are addicted. Addiction is a behavior related to getting and using a drug for a nonmedical reason. If you have pain, you have a medical reason to take pain medicine. Your doctor will tell you how much medicine to take. If your doctor wants you to stop the medicine, the dose will be slowly lowered over time to avoid any side effects. You may get drowsy or dizzy. Do not drive, use machinery, or do anything that needs mental alertness until you know how this medicine affects you. Do not stand or sit up quickly, especially if you are an older patient. This reduces the risk of dizzy or fainting  spells. Alcohol may interfere with the effect of this medicine. Avoid alcoholic drinks. The medicine will cause constipation. Try to have a bowel movement at least every 2 to 3 days. If you do not have a bowel movement for 3 days, call your doctor or health care professional. Do not take Tylenol (acetaminophen) or medicines that have acetaminophen with this medicine. Too much acetaminophen can be very dangerous. Many nonprescription medicines contain acetaminophen. Always read the labels carefully to avoid taking more acetaminophen. What side effects may I notice from receiving this medicine? Side effects that you should report to your doctor or health care professional as soon as possible: -allergic reactions like skin rash, itching or hives, swelling of the face, lips, or tongue -breathing difficulties, wheezing -confusion -light headedness or fainting spells -severe stomach pain -yellowing of the skin or the whites of the eyes Side effects that usually do not require medical attention (report to your doctor or health care professional if they continue or are bothersome): -dizziness -drowsiness -nausea -vomiting This list may not describe all possible side effects. Call your doctor for medical advice about side effects. You may report side effects to FDA at 1-800-FDA-1088. Where should I keep my medicine? Keep out of the reach of children. This medicine can be abused. Keep your medicine in a safe place to protect it from theft. Do not share this medicine with anyone. Selling or giving away this medicine is dangerous and against the law. Store at room temperature between 20 and 25 degrees C (68 and 77 degrees F). Keep container tightly closed. Protect from light. Flush  any unused medicines down the toilet. Do not use the medicine after the expiration date. NOTE: This sheet is a summary. It may not cover all possible information. If you have questions about this medicine, talk to your doctor,  pharmacist, or health care provider.  2012, [employer]. (11/13/2008 10:01:21 AM)

## 2012-05-12 NOTE — ED Provider Notes (Signed)
History   This chart was scribed for Delora Fuel, MD scribed by Mitchell Heir. The patient was seen in room APA09/APA09 seen at  19:04   CSN: [id number]  Arrival date & time 05/12/12  1839   First MD Initiated Contact with Patient 05/12/12 1854      Chief Complaint  Patient presents with  . Rib Injury    (Consider location/radiation/quality/duration/timing/severity/associated sxs/prior treatment) HPI Heidi Landry is a 26 y.o. female who presents to the Emergency Department complaining of throbbing, achy, and sharp left rib pain resulting from prior injury. She says she was seen one month ago after she fell off the porch. She states that she was given medication to treat her compression fracture. Says that the pain had subsided, but that the pain came back 2 weeks ago. Pt explains that pain is aggravated when she lays down on her right side, furthering that her "left side feels like it is sqooshed." Patient also says pain is currently a 6/10, and a 10/10 at its worse. She's reportedly taken Norco 7.5 with no relief and a 600 mg ibuprofen also with no relief. Denies fever, chills, and sweats. Patient states that she smokes a pack every two days. PCP: None, [facility]es [facility] Past Medical History  Diagnosis Date  . Hypertension   . Edema     Past Surgical History  Procedure Date  . Cholecystectomy     Family History  Problem Relation Age of Onset  . Cancer Other   . Hypertension Other   . Thyroid disease Other   . Hyperlipidemia Other   . Diabetes    . Lung disease    . Arthritis      History  Substance Use Topics  . Smoking status: Current Everyday Smoker -- 0.5 packs/day for 4 years    Types: Cigarettes  . Smokeless tobacco: Never Used  . Alcohol Use: No   Review of Systems  All other systems reviewed and are negative.   10 Systems reviewed and are negative for acute change except as noted in the HPI. Allergies  Review of patient's allergies indicates no  known allergies.  Home Medications   Current Outpatient Rx  Name Route Sig Dispense Refill  . BACLOFEN 10 MG PO TABS Oral Take 1 tablet (10 mg total) by mouth 3 (three) times daily. 30 each 0  . CETIRIZINE HCL 10 MG PO TABS Oral Take 10 mg by mouth daily.    Marland Kitchen HYDROCHLOROTHIAZIDE 25 MG PO TABS Oral Take 25 mg by mouth daily.    Marland Kitchen METOPROLOL SUCCINATE ER 50 MG PO TB24 Oral Take 50 mg by mouth daily. Take with or immediately following a meal.    . VITAMIN C 500 MG PO TABS Oral Take 500 mg by mouth daily.      BP 136/72  Pulse 91  Temp(Src) 98.1 F (36.7 C) (Oral)  Resp 20  Ht 5' 2"  (1.575 m)  Wt 354 lb (160.573 kg)  BMI 64.75 kg/m2  SpO2 97%  LMP 02/13/2012  Physical Exam  Nursing note and vitals reviewed. Constitutional: She is oriented to person, place, and time. She appears well-developed and well-nourished. No distress.       Obese  HENT:  Head: Normocephalic and atraumatic.  Eyes: EOM are normal. Pupils are equal, round, and reactive to light.  Neck: Neck supple. No tracheal deviation present.  Cardiovascular: Normal rate.   Pulmonary/Chest: Effort normal. No respiratory distress. She exhibits tenderness.  Mild tenderness to left posterior rib cage that is poorly localized   Abdominal: Soft. She exhibits no distension.  Musculoskeletal: Normal range of motion. She exhibits no edema.       2+ pitting edema  Neurological: She is alert and oriented to person, place, and time. No sensory deficit.  Skin: Skin is warm and dry.  Psychiatric: She has a normal mood and affect. Her behavior is normal.    ED Course  Procedures (including critical care time) DIAGNOSTIC STUDIES: Oxygen Saturation is 97% on room air, normal by my interpretation.    COORDINATION OF CARE:  Results for orders placed during the hospital encounter of 05/12/12  POCT PREGNANCY, URINE      Component Value Range   Preg Test, Ur NEGATIVE  NEGATIVE    Dg Ribs Unilateral W/chest Left  05/12/2012   *RADIOLOGY REPORT*  Clinical Data: Fall.  Left chest pain.  LEFT RIBS AND CHEST - 3+ VIEW  Comparison: 03/29/2012  Findings: Technical factors related to patient body habitus reduce diagnostic sensitivity and specificity.  No pneumothorax or pleural effusion noted.  Cardiomegaly is present.  Low lung volumes noted.  Equivocal nondisplaced fracture of the left sixth rib noted.  IMPRESSION:  1.  Equivocal appearance for nondisplaced fracture the left sixth rib. 2.  Cardiomegaly. 3.  Low lung volumes. 4.  No pneumothorax or pleural effusion observed.  Original Report Authenticated By: Carron Curie, M.D.     1. Left rib fracture       MDM  Persistent rib cage pain which may be related to a fracture. She will be sent for x-ray. Prior records were reviewed and she did have a compression fracture of T12 from the same fall.  X-ray does appear to show a nondisplaced rib fracture. She is to get relief with hydrocodone 7.5 mg. Will try switching to oxycodone. She's given a prescription for Percocet 7.5.  I personally performed the services described in this documentation, which was scribed in my presence. The recorded information has been reviewed and considered.           Delora Fuel, MD 12/50/87 1994

## 2012-05-13 ENCOUNTER — Telehealth: Payer: Self-pay | Admitting: Orthopedic Surgery

## 2012-05-13 NOTE — Telephone Encounter (Signed)
Heidi Landry called this afternoon, wanted you to know she had been to the ER and has fractured ribs.  I told her you do not treat this and she should follow-up with her PCP.  She is scheduled to see you 05/26/12 for a follow-up for her  Compression fracture of  Thoracic vertebrae.She just wanted you to know this.

## 2012-05-25 ENCOUNTER — Ambulatory Visit (HOSPITAL_COMMUNITY)
Admission: RE | Admit: 2012-05-25 | Discharge: 2012-05-25 | Disposition: A | Discharge status: Home / Self Care | Payer: Self-pay | Source: Ambulatory Visit | Attending: Orthopedic Surgery | Admitting: Orthopedic Surgery

## 2012-05-25 DIAGNOSIS — S22009A Unspecified fracture of unspecified thoracic vertebra, initial encounter for closed fracture: Secondary | ICD-10-CM

## 2012-05-25 DIAGNOSIS — X58XXXA Exposure to other specified factors, initial encounter: Secondary | ICD-10-CM | POA: Insufficient documentation

## 2012-05-26 ENCOUNTER — Ambulatory Visit (INDEPENDENT_AMBULATORY_CARE_PROVIDER_SITE_OTHER): Payer: Self-pay | Admitting: Orthopedic Surgery

## 2012-05-26 ENCOUNTER — Encounter: Payer: Self-pay | Admitting: Orthopedic Surgery

## 2012-05-26 VITALS — BP 106/62 | Ht 62.0 in | Wt 354.0 lb

## 2012-05-26 DIAGNOSIS — S22009A Unspecified fracture of unspecified thoracic vertebra, initial encounter for closed fracture: Secondary | ICD-10-CM

## 2012-05-26 NOTE — Patient Instructions (Signed)
You should expect complete recovery   Resume activities slowly   No heavy lifting until after July 11

## 2012-05-26 NOTE — Progress Notes (Signed)
Subjective:     Patient ID: Heidi Landry, female   DOB: 1986-01-28, 26 y.o.   MRN: [id number] Chief Complaint  Patient presents with  . Follow-up    6 week recheck back    HPI Heidi Landry is a 26 y.o. female with a chief complaint of crushed vertebrae and back  The patient was admitted on April 11. She fell off of a deck. She was evaluated in the emergency room and found to have a compression fracture of thoracic vertebrae. Vertebra, #12, has 25% age-indeterminate compression deformity in the area of tenderness   Review of Systems Reports some numbness and tingling in the vertebrae that is fractured as palpated    Objective:   Physical Exam Physical Exam(6) GENERAL: normal development   CDV: pulses are normal   Skin: normal  Psychiatric: awake, alert and oriented severe kyphosis of the thoracic spine, chronic, and also of the thoracic or lumbar spine, chronic T11-L1 area is tender.  Ambulation is normal    Assessment:     Xray no change in fracture ht suggests stable and healed   Healed th vert fracture     Plan:     F/u prn  OOW X THRU July 11

## 2012-06-24 NOTE — Telephone Encounter (Signed)
No note from doctor, was seen for an appointment 05/26/12

## 2012-06-30 ENCOUNTER — Other Ambulatory Visit: Payer: Self-pay | Admitting: Orthopedic Surgery

## 2012-07-07 ENCOUNTER — Telehealth: Payer: Self-pay | Admitting: Orthopedic Surgery

## 2012-07-07 NOTE — Telephone Encounter (Signed)
You last saw Heidi Landry 05/26/12 for follow-up for  fractured vertebrae.  She is requesting another appointment here because her back is still hurting, says she hurts so bad she cannot sleep at night.  Last time here she had an XR prior to her appointment at [facility].  Does she  Need to get an XR before coming here?

## 2012-07-07 NOTE — Telephone Encounter (Signed)
No

## 2012-07-13 ENCOUNTER — Ambulatory Visit (INDEPENDENT_AMBULATORY_CARE_PROVIDER_SITE_OTHER): Payer: Self-pay | Admitting: Orthopedic Surgery

## 2012-07-13 ENCOUNTER — Encounter: Payer: Self-pay | Admitting: Orthopedic Surgery

## 2012-07-13 VITALS — BP 130/84 | Ht 63.0 in | Wt 354.0 lb

## 2012-07-13 DIAGNOSIS — S22009A Unspecified fracture of unspecified thoracic vertebra, initial encounter for closed fracture: Secondary | ICD-10-CM

## 2012-07-13 NOTE — Progress Notes (Signed)
Patient ID: Heidi Landry, female   DOB: [birth year], 26 y.o.   MRN: [id number] Chief Complaint  Patient presents with  . Follow-up    Recheck on T-spine, still having pain.    Ht 5' 3"  (1.6 m)  Wt 354 lb (160.573 kg)  BMI 62.71 kg/m2  Compression fracture of thoracic vertebrae. Vertebra, #12, has 25% age-indeterminate compression deformity   ROS numbness around the trunk    The patient continues to complain of pain in the thoracic spine. She indicates she cannot do any lifting at work.  Recommend MRI to evaluate previous compression fracture T12.  RTW NO LIFTING

## 2012-07-13 NOTE — Patient Instructions (Addendum)
You have been scheduled for an MRI scan.  Your insurance company requires a precertification prior to scheduling the MRI.  If the MRI scan is not approved we will let you know and make further treatment recommendations according to your insurance's guidelines.  We will call you with the results  [place]   RTW WITH NO LIFTING RESTRICTIONS

## 2012-07-13 NOTE — Telephone Encounter (Signed)
Patient scheduled and seen in office 07-13-12

## 2012-07-20 ENCOUNTER — Telehealth: Payer: Self-pay | Admitting: Radiology

## 2012-07-20 NOTE — Telephone Encounter (Signed)
Patient is aware of her MRI appointment at [facility] on 07-21-12 at 11:45. Patient has the [facility] discount. Patient will come back for her results.

## 2012-07-21 ENCOUNTER — Other Ambulatory Visit: Payer: Self-pay | Admitting: *Deleted

## 2012-07-21 ENCOUNTER — Ambulatory Visit (HOSPITAL_COMMUNITY)
Admission: RE | Admit: 2012-07-21 | Discharge: 2012-07-21 | Disposition: A | Discharge status: Home / Self Care | Payer: Self-pay | Source: Ambulatory Visit | Attending: Orthopedic Surgery | Admitting: Orthopedic Surgery

## 2012-07-21 DIAGNOSIS — S22009A Unspecified fracture of unspecified thoracic vertebra, initial encounter for closed fracture: Secondary | ICD-10-CM

## 2012-07-31 ENCOUNTER — Ambulatory Visit
Admission: RE | Admit: 2012-07-31 | Discharge: 2012-07-31 | Disposition: A | Discharge status: Home / Self Care | Payer: No Typology Code available for payment source | Source: Ambulatory Visit | Attending: Orthopedic Surgery | Admitting: Orthopedic Surgery

## 2012-07-31 DIAGNOSIS — S22009A Unspecified fracture of unspecified thoracic vertebra, initial encounter for closed fracture: Secondary | ICD-10-CM

## 2012-08-03 ENCOUNTER — Other Ambulatory Visit: Payer: Self-pay | Admitting: *Deleted

## 2012-08-03 ENCOUNTER — Telehealth: Payer: Self-pay | Admitting: *Deleted

## 2012-08-03 DIAGNOSIS — IMO0002 Reserved for concepts with insufficient information to code with codable children: Secondary | ICD-10-CM

## 2012-08-03 DIAGNOSIS — S22009A Unspecified fracture of unspecified thoracic vertebra, initial encounter for closed fracture: Secondary | ICD-10-CM

## 2012-08-12 ENCOUNTER — Ambulatory Visit: Payer: Self-pay | Admitting: Orthopedic Surgery

## 2012-08-12 ENCOUNTER — Telehealth: Payer: Self-pay | Admitting: *Deleted

## 2012-08-12 ENCOUNTER — Other Ambulatory Visit: Payer: Self-pay | Admitting: *Deleted

## 2012-08-12 DIAGNOSIS — IMO0002 Reserved for concepts with insufficient information to code with codable children: Secondary | ICD-10-CM

## 2012-08-12 NOTE — Telephone Encounter (Signed)
Will send referral to [facility] Neurosurgery

## 2012-08-13 ENCOUNTER — Telehealth: Payer: Self-pay | Admitting: Orthopedic Surgery

## 2012-08-13 NOTE — Telephone Encounter (Signed)
Received fax from [facility] Neurosurgery Department (fax [phone], ph [phone], stating that appointment has been declined; states not accepting new patient referrals at this time, and to check back after November 28, 2012 for further updates on the status of their clinic.  Also states that if you have a special circumstance or would like to discuss the matter with the program director, to contact Dr. Rozanna Box at [phone].

## 2012-08-16 ENCOUNTER — Encounter (HOSPITAL_COMMUNITY): Payer: Self-pay | Admitting: *Deleted

## 2012-08-16 ENCOUNTER — Inpatient Hospital Stay (HOSPITAL_COMMUNITY)
Admission: EM | Admit: 2012-08-16 | Discharge: 2012-08-18 | DRG: 603 | Disposition: A | Discharge status: Home / Self Care | Payer: MEDICAID | Attending: Internal Medicine | Admitting: Internal Medicine

## 2012-08-16 ENCOUNTER — Emergency Department (HOSPITAL_COMMUNITY): Payer: Self-pay

## 2012-08-16 ENCOUNTER — Other Ambulatory Visit: Payer: Self-pay | Admitting: *Deleted

## 2012-08-16 DIAGNOSIS — N83209 Unspecified ovarian cyst, unspecified side: Secondary | ICD-10-CM | POA: Diagnosis present

## 2012-08-16 DIAGNOSIS — R109 Unspecified abdominal pain: Secondary | ICD-10-CM

## 2012-08-16 DIAGNOSIS — L03311 Cellulitis of abdominal wall: Secondary | ICD-10-CM | POA: Diagnosis present

## 2012-08-16 DIAGNOSIS — Z79899 Other long term (current) drug therapy: Secondary | ICD-10-CM

## 2012-08-16 DIAGNOSIS — I10 Essential (primary) hypertension: Secondary | ICD-10-CM | POA: Diagnosis present

## 2012-08-16 DIAGNOSIS — S22009A Unspecified fracture of unspecified thoracic vertebra, initial encounter for closed fracture: Secondary | ICD-10-CM

## 2012-08-16 DIAGNOSIS — N83202 Unspecified ovarian cyst, left side: Secondary | ICD-10-CM | POA: Diagnosis present

## 2012-08-16 DIAGNOSIS — F17200 Nicotine dependence, unspecified, uncomplicated: Secondary | ICD-10-CM | POA: Diagnosis present

## 2012-08-16 DIAGNOSIS — L0390 Cellulitis, unspecified: Secondary | ICD-10-CM

## 2012-08-16 DIAGNOSIS — Z6841 Body Mass Index (BMI) 40.0 and over, adult: Secondary | ICD-10-CM

## 2012-08-16 DIAGNOSIS — L02219 Cutaneous abscess of trunk, unspecified: Principal | ICD-10-CM | POA: Diagnosis present

## 2012-08-16 DIAGNOSIS — IMO0002 Reserved for concepts with insufficient information to code with codable children: Secondary | ICD-10-CM

## 2012-08-16 DIAGNOSIS — E669 Obesity, unspecified: Secondary | ICD-10-CM

## 2012-08-16 DIAGNOSIS — Z23 Encounter for immunization: Secondary | ICD-10-CM

## 2012-08-16 DIAGNOSIS — R609 Edema, unspecified: Secondary | ICD-10-CM | POA: Diagnosis present

## 2012-08-16 LAB — PREGNANCY, URINE: Preg Test, Ur: NEGATIVE

## 2012-08-16 LAB — URINALYSIS, ROUTINE W REFLEX MICROSCOPIC
Nitrite: NEGATIVE
Specific Gravity, Urine: 1.015 (ref 1.005–1.030)
Urobilinogen, UA: 0.2 mg/dL (ref 0.0–1.0)
pH: 6 (ref 5.0–8.0)

## 2012-08-16 MED ORDER — VANCOMYCIN HCL IN DEXTROSE 1-5 GM/200ML-% IV SOLN
1000.0000 mg | Freq: Once | INTRAVENOUS | Status: AC
  Administered 2012-08-17: 1000 mg via INTRAVENOUS
  Filled 2012-08-16: qty 200

## 2012-08-16 MED ORDER — HYDROCODONE-ACETAMINOPHEN 5-325 MG PO TABS: 1.0000 | ORAL_TABLET | Freq: Four times a day (QID) | ORAL | Status: AC | PRN

## 2012-08-16 MED ORDER — SODIUM CHLORIDE 0.9 % IV SOLN
Freq: Once | INTRAVENOUS | Status: AC
  Administered 2012-08-17: 20 mL/h via INTRAVENOUS

## 2012-08-16 MED ORDER — CEFTRIAXONE SODIUM 1 G IJ SOLR: 1.0000 g | Freq: Once | INTRAMUSCULAR | Status: DC

## 2012-08-16 MED ORDER — KETOROLAC TROMETHAMINE 30 MG/ML IJ SOLN
30.0000 mg | Freq: Once | INTRAMUSCULAR | Status: AC
  Administered 2012-08-17: 30 mg via INTRAVENOUS
  Filled 2012-08-16: qty 1

## 2012-08-16 MED ORDER — HYDROMORPHONE HCL PF 1 MG/ML IJ SOLN
1.0000 mg | Freq: Once | INTRAMUSCULAR | Status: AC
  Administered 2012-08-17: 1 mg via INTRAVENOUS
  Filled 2012-08-16: qty 1

## 2012-08-16 MED ORDER — ONDANSETRON HCL 4 MG/2ML IJ SOLN
4.0000 mg | Freq: Once | INTRAMUSCULAR | Status: AC
  Administered 2012-08-17: 4 mg via INTRAVENOUS
  Filled 2012-08-16: qty 2

## 2012-08-16 NOTE — ED Notes (Signed)
Lt flank pain,for 2 days,  Pain , redness low abd. For 3-4 mos  No NVD

## 2012-08-16 NOTE — Telephone Encounter (Signed)
Noted will try [facility]

## 2012-08-16 NOTE — ED Provider Notes (Cosign Needed)
History     CSN: [id number]  Arrival date & time 08/16/12  2005   First MD Initiated Contact with Patient 08/16/12 2043      Chief Complaint  Patient presents with  . Abdominal Pain    (Consider location/radiation/quality/duration/timing/severity/associated sxs/prior treatment) Patient is a 26 y.o. female presenting with abdominal pain. The history is provided by the patient (pt complains of llq pain). No language interpreter was used.  Abdominal Pain The primary symptoms of the illness include abdominal pain. The primary symptoms of the illness do not include fever, fatigue or diarrhea. The current episode started more than 2 days ago. The onset of the illness was gradual. The problem has not changed since onset. Associated with: nothing. The patient states that she believes she is currently not pregnant. The patient has not had a change in bowel habit. Symptoms associated with the illness do not include chills, hematuria, frequency or back pain. Significant associated medical issues do not include PUD.    Past Medical History  Diagnosis Date  . Hypertension   . Edema     Past Surgical History  Procedure Date  . Cholecystectomy     Family History  Problem Relation Age of Onset  . Cancer Other   . Hypertension Other   . Thyroid disease Other   . Hyperlipidemia Other   . Diabetes    . Lung disease    . Arthritis      History  Substance Use Topics  . Smoking status: Current Everyday Smoker -- 0.5 packs/day for 4 years    Types: Cigarettes  . Smokeless tobacco: Never Used  . Alcohol Use: No    OB History    Grav Para Term Preterm Abortions TAB SAB Ect Mult Living            0      Review of Systems  Constitutional: Negative for fever, chills and fatigue.  HENT: Negative for congestion, sinus pressure and ear discharge.   Eyes: Negative for discharge.  Respiratory: Negative for cough.   Cardiovascular: Negative for chest pain.  Gastrointestinal: Positive for  abdominal pain. Negative for diarrhea.  Genitourinary: Negative for frequency and hematuria.  Musculoskeletal: Negative for back pain.  Skin: Negative for rash.  Neurological: Negative for seizures and headaches.  Hematological: Negative.   Psychiatric/Behavioral: Negative for hallucinations.    Allergies  Review of patient's allergies indicates no known allergies.  Home Medications   Current Outpatient Rx  Name Route Sig Dispense Refill  . CETIRIZINE HCL 10 MG PO TABS Oral Take 10 mg by mouth daily.    Marland Kitchen DOCUSATE SODIUM 100 MG PO CAPS Oral Take 100 mg by mouth every other day.    . FUROSEMIDE 40 MG PO TABS Oral Take 40 mg by mouth every morning.     Marland Kitchen METOPROLOL TARTRATE 50 MG PO TABS Oral Take 50 mg by mouth 2 (two) times daily.    Marland Kitchen VITAMIN C 500 MG PO TABS Oral Take 500 mg by mouth daily.      BP 164/78  Pulse 102  Temp 98.2 F (36.8 C) (Oral)  Resp 20  Ht 5' 2"  (1.575 m)  Wt 370 lb (167.831 kg)  BMI 67.67 kg/m2  SpO2 97%  LMP 01/17/2012  Physical Exam  Constitutional: She is oriented to person, place, and time. She appears well-developed.  HENT:  Head: Normocephalic and atraumatic.  Eyes: Conjunctivae and EOM are normal. No scleral icterus.  Neck: Neck supple. No thyromegaly present.  Cardiovascular: Normal rate and regular rhythm.  Exam reveals no gallop and no friction rub.   No murmur heard. Pulmonary/Chest: No stridor. She has no wheezes. She has no rales. She exhibits no tenderness.  Abdominal: There is tenderness. There is no rebound.       Mild tenderness throughout abd with edema  Musculoskeletal: Normal range of motion. She exhibits no edema.  Lymphadenopathy:    She has no cervical adenopathy.  Neurological: She is oriented to person, place, and time. Coordination normal.  Skin: No rash noted. No erythema.  Psychiatric: She has a normal mood and affect. Her behavior is normal.    ED Course  Procedures (including critical care time)  Labs Reviewed   URINALYSIS, ROUTINE W REFLEX MICROSCOPIC - Abnormal; Notable for the following:    Color, Urine AMBER (*)  BIOCHEMICALS MAY BE AFFECTED BY COLOR   APPearance HAZY (*)     All other components within normal limits  PREGNANCY, URINE   No results found.   No diagnosis found.    MDM          Maudry Diego, MD 08/20/12 [phone]

## 2012-08-16 NOTE — ED Provider Notes (Signed)
History  This chart was scribed for [employer]. Olin Hauser, MD by Kevan Rosebush. This patient was seen in room APA06/APA06 and the patient's care was started at 2313  CSN: [id number]  Arrival date & time 08/16/12  2005   First MD Initiated Contact with Patient 08/16/12 2315    Chief Complaint  Patient presents with  . Abdominal Pain    (Consider location/radiation/quality/duration/timing/severity/associated sxs/prior treatment) HPI Heidi Landry is a 26 y.o. female who presents to the Emergency Department complaining of left flank pain and erythema below the abdomen for the past 2 days. Pt denies any nausea, emesis, and diarrhea.  Abdominal Pain  The primary symptoms of the illness include abdominal pain. The primary symptoms of the illness do not include fever, fatigue or diarrhea. The current episode started more than 2 days ago. The onset of the illness was gradual. The problem has not changed since onset.  Associated with: nothing. The patient states that she believes she is currently not pregnant. The patient has not had a change in bowel habit. Symptoms associated with the illness do not include chills, hematuria, frequency or back pain. Significant associated medical issues do not include PUD.   Pt has NKDA.   Past Medical History  Diagnosis Date  . Hypertension   . Edema     Past Surgical History  Procedure Date  . Cholecystectomy     Family History  Problem Relation Age of Onset  . Cancer Other   . Hypertension Other   . Thyroid disease Other   . Hyperlipidemia Other   . Diabetes    . Lung disease    . Arthritis      History  Substance Use Topics  . Smoking status: Current Everyday Smoker -- 0.5 packs/day for 4 years    Types: Cigarettes  . Smokeless tobacco: Never Used  . Alcohol Use: No    OB History    Grav Para Term Preterm Abortions TAB SAB Ect Mult Living            0      Review of Systems  Constitutional: Negative for fever.       10 Systems  reviewed and are negative for acute change except as noted in the HPI.  HENT: Negative for congestion.   Eyes: Negative for discharge and redness.  Respiratory: Negative for cough and shortness of breath.   Cardiovascular: Negative for chest pain.  Gastrointestinal: Negative for vomiting and abdominal pain.  Genitourinary: Positive for flank pain.  Musculoskeletal: Negative for back pain.  Skin: Negative for rash.       Skin redness to lower abdomen  Neurological: Negative for syncope, numbness and headaches.  Psychiatric/Behavioral:       No behavior change.     Allergies  Review of patient's allergies indicates no known allergies.  Home Medications   Current Outpatient Rx  Name Route Sig Dispense Refill  . CETIRIZINE HCL 10 MG PO TABS Oral Take 10 mg by mouth daily.    Marland Kitchen DOCUSATE SODIUM 100 MG PO CAPS Oral Take 100 mg by mouth every other day.    . FUROSEMIDE 40 MG PO TABS Oral Take 40 mg by mouth every morning.     Marland Kitchen METOPROLOL TARTRATE 50 MG PO TABS Oral Take 50 mg by mouth 2 (two) times daily.    Marland Kitchen VITAMIN C 500 MG PO TABS Oral Take 500 mg by mouth daily.    Marland Kitchen HYDROCODONE-ACETAMINOPHEN 5-325 MG PO TABS Oral Take 1  tablet by mouth every 6 (six) hours as needed for pain. 10 tablet 0    Triage Vitals: BP 164/78  Pulse 102  Temp 98.2 F (36.8 C) (Oral)  Resp 20  Ht 5' 2"  (1.575 m)  Wt 370 lb (167.831 kg)  BMI 67.67 kg/m2  SpO2 97%  LMP 01/17/2012  Physical Exam  Nursing note and vitals reviewed. Constitutional: She is oriented to person, place, and time. She appears well-developed and well-nourished.       Morbidly obese.  HENT:  Head: Normocephalic and atraumatic.  Eyes: EOM are normal.  Neck: Neck supple. No tracheal deviation present.  Cardiovascular: Normal rate.   Pulmonary/Chest: Effort normal. No respiratory distress.  Abdominal: Soft. She exhibits no distension.       No CVA tenderness to percussion. Diffuse pan panicular cellulitis that extends across  the entire lower abdomen and mons but not to the upper thigh with a peau d'orange appearance to the lower abdomen.    Musculoskeletal: Normal range of motion. She exhibits no edema.  Neurological: She is alert and oriented to person, place, and time.  Skin: Skin is warm and dry.  Psychiatric: She has a normal mood and affect.    ED Course  Procedures (including critical care time) DIAGNOSTIC STUDIES: Oxygen Saturation is 97% on room air, adequate by my interpretation.    COORDINATION OF CARE:  2313 Patient was seen by me. I was not aware she had been seen and evaluated previously tonight by Dr. Roderic Palau. Examined patient, initiated treatment, reviewed CT scan, will arrange for admission to the hospital. 23:33--I evaluated the patient and we discussed a treatment plan including blood work, pain medication, nausea medication, and admission to which the pt agreed.  I informed the pt of the details of her infection, abdominal cellulitis.     Labs Reviewed  URINALYSIS, ROUTINE W REFLEX MICROSCOPIC - Abnormal; Notable for the following:    Color, Urine AMBER (*)  BIOCHEMICALS MAY BE AFFECTED BY COLOR   APPearance HAZY (*)     All other components within normal limits  PREGNANCY, URINE   Ct Abdomen Pelvis Wo Contrast  08/16/2012  *RADIOLOGY REPORT*  Clinical Data: Right lower quadrant and left flank pain.  CT ABDOMEN AND PELVIS WITHOUT CONTRAST  Technique:  Multidetector CT imaging of the abdomen and pelvis was performed following the standard protocol without intravenous contrast.  Comparison: None.  Findings: The lung bases are clear.  The kidneys appear symmetrical in size and shape.  No pyelocaliectasis or ureterectasis.  No renal, ureteral, or bladder stones identified.  The bladder is decompressed and cannot be evaluated for wall thickness.  The surgical absence of the gallbladder.  Low attenuation change in the liver consistent with diffuse fatty infiltration.  The unenhanced appearance of  the liver, spleen, pancreas, adrenal glands, abdominal aorta, and retroperitoneal lymph nodes is otherwise unremarkable.  Prominent visceral adipose tissues.  No free fluid or free air in the abdomen.  The stomach, small bowel, and colon are not abnormally distended.  Abdominal wall musculature appears intact.  Pelvis:  There is a hypodense left adnexal mass measuring 6.5 cm diameter.  Density measurements suggest a cystic lesion.  If clinically indicated, this could be further characterized at ultrasound.  No right adnexal masses.  Uterus is not enlarged.  No free or loculated pelvic fluid collections.  No significant pelvic lymphadenopathy.  The appendix is normal.  No evidence of diverticulitis.  Degenerative changes in the thoracolumbar spine.  IMPRESSION: No renal  or ureteral stone or obstruction.  No inflammatory changes demonstrated.  Diffuse fatty infiltration of the liver.  Probable cystic left ovarian mass measuring 6.5 cm diameter.   Original Report Authenticated By: Neale Burly, M.D.     2:17 AM:  T/C to Dr. Shanon Brow, hospitalist, case discussed, including:  HPI, pertinent PM/SHx, VS/PE, dx testing, ED course and treatment.  Agreeable to admission.  Requests to write temporary orders, med surg bed to team 1      MDM  Patient with LLQ pain, flank pain and erythema and swelling to the abdominal wall. CT scan with an ovarian cystic mass in the left ovary. Abdominal wall with a cellulitis. Initiated antibiotic therapy. Spoke with Dr. Shanon Brow, hospitalist who will admit the patient.Pt stable in ED with no significant deterioration in condition.The patient appears reasonably stabilized for admission considering the current resources, flow, and capabilities available in the ED at this time, and I doubt any other [facility] requiring further screening and/or treatment in the ED prior to admission.  I personally performed the services described in this documentation, which was scribed in my presence. The  recorded information has been reviewed and considered.  MDM Reviewed: nursing note and vitals Interpretation: labs and CT scan         Gypsy Balsam. Olin Hauser, MD 08/17/12 5747

## 2012-08-17 ENCOUNTER — Encounter (HOSPITAL_COMMUNITY): Payer: Self-pay | Admitting: Intensive Care

## 2012-08-17 DIAGNOSIS — R609 Edema, unspecified: Secondary | ICD-10-CM | POA: Diagnosis present

## 2012-08-17 DIAGNOSIS — L03311 Cellulitis of abdominal wall: Secondary | ICD-10-CM | POA: Diagnosis present

## 2012-08-17 DIAGNOSIS — E669 Obesity, unspecified: Secondary | ICD-10-CM

## 2012-08-17 DIAGNOSIS — L0390 Cellulitis, unspecified: Secondary | ICD-10-CM

## 2012-08-17 DIAGNOSIS — N83202 Unspecified ovarian cyst, left side: Secondary | ICD-10-CM | POA: Diagnosis present

## 2012-08-17 DIAGNOSIS — N83209 Unspecified ovarian cyst, unspecified side: Secondary | ICD-10-CM

## 2012-08-17 DIAGNOSIS — R109 Unspecified abdominal pain: Secondary | ICD-10-CM

## 2012-08-17 DIAGNOSIS — I10 Essential (primary) hypertension: Secondary | ICD-10-CM | POA: Diagnosis present

## 2012-08-17 LAB — CBC WITH DIFFERENTIAL/PLATELET
Basophils Absolute: 0 10*3/uL (ref 0.0–0.1)
Lymphocytes Relative: 28 % (ref 12–46)
Lymphs Abs: 3.4 10*3/uL (ref 0.7–4.0)
Neutro Abs: 7.8 10*3/uL — ABNORMAL HIGH (ref 1.7–7.7)
Neutrophils Relative %: 65 % (ref 43–77)
Platelets: 167 10*3/uL (ref 150–400)
RBC: 4.95 MIL/uL (ref 3.87–5.11)
RDW: 15.6 % — ABNORMAL HIGH (ref 11.5–15.5)
WBC: 12.1 10*3/uL — ABNORMAL HIGH (ref 4.0–10.5)

## 2012-08-17 LAB — COMPREHENSIVE METABOLIC PANEL
ALT: 80 U/L — ABNORMAL HIGH (ref 0–35)
AST: 47 U/L — ABNORMAL HIGH (ref 0–37)
Alkaline Phosphatase: 100 U/L (ref 39–117)
CO2: 31 mEq/L (ref 19–32)
Calcium: 9.7 mg/dL (ref 8.4–10.5)
Chloride: 97 mEq/L (ref 96–112)
GFR calc non Af Amer: >90 mL/min (ref >90)
Potassium: 3.5 mEq/L (ref 3.5–5.1)
Sodium: 138 mEq/L (ref 135–145)

## 2012-08-17 LAB — MRSA PCR SCREENING: MRSA by PCR: NEGATIVE

## 2012-08-17 MED ORDER — VANCOMYCIN HCL 1000 MG IV SOLR
1500.0000 mg | Freq: Two times a day (BID) | INTRAVENOUS | Status: DC
  Administered 2012-08-17 – 2012-08-18 (×2): 1500 mg via INTRAVENOUS
  Filled 2012-08-17 (×2): qty 1500

## 2012-08-17 MED ORDER — METOPROLOL TARTRATE 50 MG PO TABS
50.0000 mg | ORAL_TABLET | Freq: Two times a day (BID) | ORAL | Status: DC
  Administered 2012-08-17 – 2012-08-18 (×3): 50 mg via ORAL
  Filled 2012-08-17 (×3): qty 1

## 2012-08-17 MED ORDER — PNEUMOCOCCAL VAC POLYVALENT 25 MCG/0.5ML IJ INJ
0.5000 mL | INJECTION | INTRAMUSCULAR | Status: AC
  Administered 2012-08-18: 0.5 mL via INTRAMUSCULAR
  Filled 2012-08-17: qty 0.5

## 2012-08-17 MED ORDER — SODIUM CHLORIDE 0.9 % IJ SOLN
3.0000 mL | Freq: Two times a day (BID) | INTRAMUSCULAR | Status: DC
  Administered 2012-08-17 (×2): 3 mL via INTRAVENOUS
  Filled 2012-08-17 (×2): qty 3

## 2012-08-17 MED ORDER — DEXTROSE 5 % IV SOLN
1.0000 g | Freq: Once | INTRAVENOUS | Status: AC
  Administered 2012-08-17: 1 g via INTRAVENOUS
  Filled 2012-08-17: qty 10

## 2012-08-17 MED ORDER — ACETAMINOPHEN 325 MG PO TABS
ORAL_TABLET | ORAL | Status: AC
  Filled 2012-08-17: qty 2

## 2012-08-17 MED ORDER — VANCOMYCIN HCL IN DEXTROSE 1-5 GM/200ML-% IV SOLN
1000.0000 mg | Freq: Once | INTRAVENOUS | Status: AC
  Administered 2012-08-17: 1000 mg via INTRAVENOUS

## 2012-08-17 MED ORDER — SODIUM CHLORIDE 0.9 % IV SOLN: INTRAVENOUS | Status: DC

## 2012-08-17 MED ORDER — VANCOMYCIN HCL IN DEXTROSE 1-5 GM/200ML-% IV SOLN
INTRAVENOUS | Status: AC
  Filled 2012-08-17: qty 200

## 2012-08-17 MED ORDER — SODIUM CHLORIDE 0.9 % IV SOLN
250.0000 mL | INTRAVENOUS | Status: DC | PRN
  Administered 2012-08-17 (×2): 250 mL via INTRAVENOUS

## 2012-08-17 MED ORDER — FUROSEMIDE 40 MG PO TABS
40.0000 mg | ORAL_TABLET | Freq: Two times a day (BID) | ORAL | Status: DC
  Administered 2012-08-17 – 2012-08-18 (×2): 40 mg via ORAL
  Filled 2012-08-17 (×2): qty 1

## 2012-08-17 MED ORDER — FUROSEMIDE 40 MG PO TABS
40.0000 mg | ORAL_TABLET | ORAL | Status: DC
  Administered 2012-08-17: 40 mg via ORAL
  Filled 2012-08-17: qty 1

## 2012-08-17 MED ORDER — ACETAMINOPHEN 325 MG PO TABS
650.0000 mg | ORAL_TABLET | Freq: Four times a day (QID) | ORAL | Status: DC | PRN
  Administered 2012-08-17 – 2012-08-18 (×2): 650 mg via ORAL
  Filled 2012-08-17: qty 2

## 2012-08-17 MED ORDER — SODIUM CHLORIDE 0.9 % IJ SOLN
3.0000 mL | INTRAMUSCULAR | Status: DC | PRN
  Administered 2012-08-18: 3 mL via INTRAVENOUS
  Filled 2012-08-17 (×2): qty 3

## 2012-08-17 NOTE — Progress Notes (Signed)
Patient admitted after midnight. Chart reviewed. Patient examined. Would benefit from increasing diuretics. Continue vancomycin. Outpatient gynecology followup. Likely has sleep apnea.

## 2012-08-17 NOTE — Progress Notes (Signed)
Utilization Review Complete  

## 2012-08-17 NOTE — Progress Notes (Signed)
ANTIBIOTIC CONSULT NOTE - INITIAL  Pharmacy Consult for vancomycin Indication: abdominal wall cellulitis  No Known Allergies  Patient Measurements: Height: 5' 2"  (157.5 cm) Weight: 368 lb 13.3 oz (167.3 kg) IBW/kg (Calculated) : 50.1   Vital Signs: Temp: 98.3 F (36.8 C) (08/20 0800) Temp src: Oral (08/20 0800) BP: 108/69 mmHg (08/20 0800) Pulse Rate: 83  (08/20 0800) Intake/Output from previous day: 08/19 0701 - 08/20 0700 In: 200 [IV Piggyback:200] Out: -  Intake/Output from this shift:    Labs:  Basename 08/16/12 2358  WBC 12.1*  HGB 14.0  PLT 167  LABCREA --  CREATININE 0.86   Estimated Creatinine Clearance: 150.5 ml/min (by C-G formula based on Cr of 0.86).   Microbiology: Recent Results (from the past 720 hour(s))  MRSA PCR SCREENING     Status: Normal   Collection Time   08/17/12  5:53 AM      Component Value Range Status Comment   MRSA by PCR NEGATIVE  NEGATIVE Final     Medical History: Past Medical History  Diagnosis Date  . Hypertension   . Edema     Medications:  Scheduled:     . sodium chloride   Intravenous Once  . acetaminophen      . cefTRIAXone (ROCEPHIN) IVPB 1 gram/50 mL D5W  1 g Intravenous Once  . furosemide  40 mg Oral BH-q7a  .  HYDROmorphone (DILAUDID) injection  1 mg Intravenous Once  . ketorolac  30 mg Intravenous Once  . metoprolol  50 mg Oral BID  . ondansetron  4 mg Intravenous Once  . pneumococcal 23 valent vaccine  0.5 mL Intramuscular Tomorrow-1000  . sodium chloride  3 mL Intravenous Q12H  . vancomycin  1,000 mg Intravenous Once  . vancomycin  1,000 mg Intravenous Once  . DISCONTD: sodium chloride   Intravenous STAT  . DISCONTD: cefTRIAXone  1 g Intramuscular Once   Assessment: Morbidly obese pt with abd wall cellulitis. Received Vancomycin 2gm load this morning.  Renal function at baseline.   Goal of Therapy:  Vancomcyin trough 10-54mg/ml  Plan:  1.  Vancomycin 15026mIV Q12h 2. Check Vancomycin trough  at steady state 3. Monitor renal function and cx data   LiBiagio Borg/20/2013,9:59 AM

## 2012-08-17 NOTE — ED Notes (Signed)
Up to bathroom with assistance.  Requesting additional water to drink.

## 2012-08-17 NOTE — ED Notes (Signed)
Dr. Shanon Brow in to speak with patient

## 2012-08-17 NOTE — Progress Notes (Signed)
INITIAL ADULT NUTRITION ASSESSMENT Date: 08/17/2012   Time: 3:25 PM Reason for Assessment: Consult due to morbid obesity  ASSESSMENT: Female 26 y.o.  Dx: Cellulitis of abdominal wall   Past Medical History  Diagnosis Date  . Hypertension   . Edema     Scheduled Meds:   . sodium chloride   Intravenous Once  . acetaminophen      . cefTRIAXone (ROCEPHIN) IVPB 1 gram/50 mL D5W  1 g Intravenous Once  . furosemide  40 mg Oral BID  .  HYDROmorphone (DILAUDID) injection  1 mg Intravenous Once  . ketorolac  30 mg Intravenous Once  . metoprolol  50 mg Oral BID  . ondansetron  4 mg Intravenous Once  . pneumococcal 23 valent vaccine  0.5 mL Intramuscular Tomorrow-1000  . sodium chloride  3 mL Intravenous Q12H  . vancomycin  1,500 mg Intravenous Q12H  . vancomycin  1,000 mg Intravenous Once  . vancomycin  1,000 mg Intravenous Once  . DISCONTD: sodium chloride   Intravenous STAT  . DISCONTD: cefTRIAXone  1 g Intramuscular Once  . DISCONTD: furosemide  40 mg Oral BH-q7a   Continuous Infusions:  PRN Meds:.sodium chloride, acetaminophen, sodium chloride  Ht: 5' 2"  (157.5 cm)  Wt: 368 lb 13.3 oz (167.3 kg)--daily wt monitoring  Ideal Wt: 50.1 kg  % Ideal Wt: 335%  Usual Wt: unknown   Body mass index is 67.46 kg/(m^2).Meets criteria for Obesity Class III  Food/Nutrition Related EK:CMKLKJ to arouse pt to provide diet hx. Chart reviewed. Left education materials from [employer] r/t General Healthful diet. Will return tomorrow to attempt edu and see if pt has questions.   CMP     Component Value Date/Time   NA 138 08/16/2012 2358   K 3.5 08/16/2012 2358   CL 97 08/16/2012 2358   CO2 31 08/16/2012 2358   GLUCOSE 107* 08/16/2012 2358   BUN 14 08/16/2012 2358   CREATININE 0.86 08/16/2012 2358   CALCIUM 9.7 08/16/2012 2358   PROT 7.8 08/16/2012 2358   ALBUMIN 3.7 08/16/2012 2358   AST 47* 08/16/2012 2358   ALT 80* 08/16/2012 2358   ALKPHOS 100 08/16/2012 2358   BILITOT 0.7 08/16/2012 2358   GFRNONAA >90 08/16/2012 2358   GFRAA >90 08/16/2012 2358    Intake/Output Summary (Last 24 hours) at 08/17/12 1530 Last data filed at 08/17/12 1300  Gross per 24 hour  Intake    203 ml  Output    450 ml  Net   -247 ml     Diet Order: Cardiac  Supplements/Tube Feeding:none at this time  IVF:    Estimated Nutritional Needs:   Kcal:1400-1600 kcal daily Protein:85-95 gr protein Fluid:>1500 ml/day  NUTRITION DIAGNOSIS: -Food and nutrition related knowledge deficit (NB-1.1).  Status: Ongoing  RELATED ZP:HXTAVWP Healthful diet  AS EVIDENCE BY: BMI=67  MONITORING/EVALUATION(Goals): Monitor po intake, labs and wt changes  EDUCATION NEEDS: -Education needs being addressed. Pt provided edu materials as noted above will return to review and answer pt questions.  INTERVENTION: -Provide edu for General Healthful diet  -Rec pt be follow-up by outpatient RD for additional nutrition counseling and diet modification monitoring and support if pt is agreeable.  Dietitian [phone]  DOCUMENTATION CODES Per approved criteria  -Morbid Obesity    Frederik Schmidt 08/17/2012, 3:25 PM

## 2012-08-17 NOTE — Plan of Care (Signed)
Problem: Food- and Nutrition-Related Knowledge Deficit (NB-1.1) Goal: Nutrition education Formal process to instruct or train a patient/client in a skill or to impart knowledge to help patients/clients voluntarily manage or modify food choices and eating behavior to maintain or improve health.  Outcome: Adequate for Discharge Nutrition dx:  Nutrition-related knowledge deficit r/t diet therapy AEB MD/nursing request  Assessment: Returned to pt this afternoon and completed diet edu. Very pleasant young lady. Review of diet hx shows excessive energy intake.  Receptive to education and follow-up with RD after d/c but sites a number of barriers such as caring for sick grandmother and dad also has illness.   Intervention:  Brief education; General Healthful diet  Provided.  Goals of nutrition therapy discussed.  Understanding confirmed.  RD contact information provided. Expect fair compliance.  Monitoring:  Knowledge; for questions.  Please consult RD if new questions present.  Pager:# [phone]

## 2012-08-17 NOTE — Progress Notes (Signed)
ANTIBIOTIC CONSULT NOTE - INITIAL  Pharmacy Consult for vancomycin Indication: abdominal wall cellulitis  No Known Allergies  Patient Measurements: Height: 5' 2"  (157.5 cm) Weight: 370 lb (167.831 kg) IBW/kg (Calculated) : 50.1  Adjusted Body Weight: 89.4 kg  Vital Signs: Temp: 98.2 F (36.8 C) (08/19 2021) Temp src: Oral (08/19 2021) BP: 109/45 mmHg (08/20 0314) Pulse Rate: 90  (08/20 0314) Intake/Output from previous day:   Intake/Output from this shift:    Labs:  Basename 08/16/12 2358  WBC 12.1*  HGB 14.0  PLT 167  LABCREA --  CREATININE 0.86   Estimated Creatinine Clearance: 150.8 ml/min (by C-G formula based on Cr of 0.86).   Microbiology: No results found for this or any previous visit (from the past 720 hour(s)).  Medical History: Past Medical History  Diagnosis Date  . Hypertension   . Edema     Medications:  Scheduled:    . sodium chloride   Intravenous Once  . furosemide  40 mg Oral BH-q7a  .  HYDROmorphone (DILAUDID) injection  1 mg Intravenous Once  . ketorolac  30 mg Intravenous Once  . metoprolol  50 mg Oral BID  . ondansetron  4 mg Intravenous Once  . sodium chloride  3 mL Intravenous Q12H  . DISCONTD: sodium chloride   Intravenous STAT  . DISCONTD: cefTRIAXone  1 g Intramuscular Once   Assessment:    Morbidly obese pt with abd wall cellulitis, on diuretic for edema, with hx of yeast infection to pannus tx'd with nystatin, but no recent use of antibiotics.  Goal of Therapy:  Protocol for obese patients calls for 2gm loading dose vancomycin followed by 1000 - 1500 mg q8h with CrCl >100 ml/min  Plan:  1.  Will give an additional dose of 1gm IV vancomycin (already given previous 1gm dose) 2.  Clinical pharmacist on day shift to assess for maintenance regimen this morning  Donya Tomaro, Nada Boozer 08/17/2012,4:51 AM

## 2012-08-17 NOTE — Care Management Note (Unsigned)
    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 Currently her Elenor Legato is there with G'mother. Pt admitted with cellulitis of the abdomin and on IV antibiotics     Action/Plan:   Spoke to pt at bedside. She anticipates being in the hospital until Thursday getting IV meds and then DC on PO antibiotics. No HH needs identified.   Anticipated DC Date:  08/19/2012   Anticipated DC Plan:  [place]  CM consult      Choice offered to / List presented to:             Status of service:  In process, will continue to follow Medicare Important Message given?   (If response is "NO", the following Medicare IM given date fields will be blank) Date Medicare IM given:   Date Additional Medicare IM given:    Discharge Disposition:    Per UR Regulation:    If discussed at Long Length of Stay Meetings, dates discussed:    Comments:  08/17/12 [facility]

## 2012-08-17 NOTE — H&P (Signed)
PCP:   Rory Percy, MD   Chief Complaint:  abd pain  HPI: 26 yo female comes in with several days of llq abd pain sharp in nature no n/v/fevers/diarrhea.  Has h/o ovarian cyst in past.  Found to have cellulitis on abd wall.  She has had redness to pannus for 2 months has seen health dept several times dx with yeast infection tx with nystatin and has gotten much better with itch and rash to pannus but the erythema persists.  Also recently started on lasix which has really helped with the dependent edema.  Pt has gained a lot of wt the last year due to stressors with family illnesses.  Now 370lbs.  No recent abx use.  Review of Systems:  O/w neg  Past Medical History: Past Medical History  Diagnosis Date  . Hypertension   . Edema    Past Surgical History  Procedure Date  . Cholecystectomy     Medications: Prior to Admission medications   Medication Sig Start Date End Date Taking? Authorizing Provider  cetirizine (ZYRTEC) 10 MG tablet Take 10 mg by mouth daily.   Yes Historical Provider, MD  docusate sodium (STOOL SOFTENER) 100 MG capsule Take 100 mg by mouth every other day.   Yes Historical Provider, MD  furosemide (LASIX) 40 MG tablet Take 40 mg by mouth every morning.    Yes Historical Provider, MD  metoprolol (LOPRESSOR) 50 MG tablet Take 50 mg by mouth 2 (two) times daily.   Yes Historical Provider, MD  vitamin C (ASCORBIC ACID) 500 MG tablet Take 500 mg by mouth daily.   Yes Historical Provider, MD  HYDROcodone-acetaminophen (NORCO/VICODIN) 5-325 MG per tablet Take 1 tablet by mouth every 6 (six) hours as needed for pain. 08/16/12 08/26/12  Maudry Diego, MD    Allergies:  No Known Allergies  Social History:  reports that she has been smoking Cigarettes.  She has a 2 pack-year smoking history. She has never used smokeless tobacco. She reports that she does not drink alcohol or use illicit drugs.  Family History: Family History  Problem Relation Age of Onset  .  Cancer Other   . Hypertension Other   . Thyroid disease Other   . Hyperlipidemia Other   . Diabetes    . Lung disease    . Arthritis      Physical Exam: Filed Vitals:   08/16/12 2021  BP: 164/78  Pulse: 102  Temp: 98.2 F (36.8 C)  TempSrc: Oral  Resp: 20  Height: 5' 2"  (1.575 m)  Weight: 167.831 kg (370 lb)  SpO2: 97%   General appearance: alert, cooperative and no distress Lungs: clear to auscultation bilaterally Heart: regular rate and rhythm, S1, S2 normal, no murmur, click, rub or gallop Abdomen: soft, non-tender; bowel sounds normal; no masses,  no organomegaly pannus with edema and erythema across abd wall c/w cellulitis no absess noted no drainage, pubic mons no absesses Extremities: extremities normal, atraumatic, no cyanosis or edema Pulses: 2+ and symmetric Skin: Skin color, texture, turgor normal. No rashes or lesions Neurologic: Grossly normal    Labs on Admission:   [facility] 08/16/12 2358  NA 138  K 3.5  CL 97  CO2 31  GLUCOSE 107*  BUN 14  CREATININE 0.86  CALCIUM 9.7  MG --  PHOS --    Basename 08/16/12 2358  AST 47*  ALT 80*  ALKPHOS 100  BILITOT 0.7  PROT 7.8  ALBUMIN 3.7    Basename 08/16/12 2358  WBC 12.1*  NEUTROABS 7.8*  HGB 14.0  HCT 42.8  MCV 86.5  PLT 167   Radiological Exams on Admission: Ct Abdomen Pelvis Wo Contrast  08/16/2012  *RADIOLOGY REPORT*  Clinical Data: Right lower quadrant and left flank pain.  CT ABDOMEN AND PELVIS WITHOUT CONTRAST  Technique:  Multidetector CT imaging of the abdomen and pelvis was performed following the standard protocol without intravenous contrast.  Comparison: None.  Findings: The lung bases are clear.  The kidneys appear symmetrical in size and shape.  No pyelocaliectasis or ureterectasis.  No renal, ureteral, or bladder stones identified.  The bladder is decompressed and cannot be evaluated for wall thickness.  The surgical absence of the gallbladder.  Low attenuation change in the liver  consistent with diffuse fatty infiltration.  The unenhanced appearance of the liver, spleen, pancreas, adrenal glands, abdominal aorta, and retroperitoneal lymph nodes is otherwise unremarkable.  Prominent visceral adipose tissues.  No free fluid or free air in the abdomen.  The stomach, small bowel, and colon are not abnormally distended.  Abdominal wall musculature appears intact.  Pelvis:  There is a hypodense left adnexal mass measuring 6.5 cm diameter.  Density measurements suggest a cystic lesion.  If clinically indicated, this could be further characterized at ultrasound.  No right adnexal masses.  Uterus is not enlarged.  No free or loculated pelvic fluid collections.  No significant pelvic lymphadenopathy.  The appendix is normal.  No evidence of diverticulitis.  Degenerative changes in the thoracolumbar spine.  IMPRESSION: No renal or ureteral stone or obstruction.  No inflammatory changes demonstrated.  Diffuse fatty infiltration of the liver.  Probable cystic left ovarian mass measuring 6.5 cm diameter.   Original Report Authenticated By: Neale Burly, M.D.      Assessment/Plan Present on Admission:  26 yo female with ovarian cyst and abd wall cellulitis .Abdominal pain .Cellulitis of abdominal wall .Ovarian cyst, left .Obese .Edema .Hypertension  vanco for cellulitis, marked out.  Ovarian cyst needs outpt gyn f/u.  Likely switch to po abx in the next 24 to 48 hours.  Cont nystatin for underlying yeast infxn.  Yahye Siebert [phone] 08/17/2012, 2:21 AM

## 2012-08-18 DIAGNOSIS — L02219 Cutaneous abscess of trunk, unspecified: Principal | ICD-10-CM

## 2012-08-18 MED ORDER — FUROSEMIDE 40 MG PO TABS: 40.0000 mg | ORAL_TABLET | Freq: Two times a day (BID) | ORAL | Status: DC

## 2012-08-18 MED ORDER — DOXYCYCLINE HYCLATE 100 MG PO TABS: 100.0000 mg | ORAL_TABLET | Freq: Two times a day (BID) | ORAL | Status: AC

## 2012-08-18 MED ORDER — HYDROCODONE-ACETAMINOPHEN 5-325 MG PO TABS: 1.0000 | ORAL_TABLET | Freq: Three times a day (TID) | ORAL | Status: AC | PRN

## 2012-08-18 MED ORDER — POTASSIUM CHLORIDE ER 10 MEQ PO TBCR: 20.0000 meq | EXTENDED_RELEASE_TABLET | Freq: Every day | ORAL | Status: DC

## 2012-08-18 MED ORDER — DOXYCYCLINE HYCLATE 100 MG PO TABS
100.0000 mg | ORAL_TABLET | Freq: Once | ORAL | Status: AC
  Administered 2012-08-18: 100 mg via ORAL
  Filled 2012-08-18: qty 1

## 2012-08-18 NOTE — Discharge Summary (Signed)
Physician Discharge Summary  Patient ID: Heidi Landry MRN: [id number] DOB/AGE: 1986-11-29 26 y.o.  Admit date: 08/16/2012 Discharge date: 08/18/2012  Discharge Diagnoses:  Principal Problem:  *Cellulitis of abdominal wall Active Problems:  Hypertension  Edema  Ovarian cyst, left  Morbid obesity   Medication List  As of 08/18/2012  9:14 AM   TAKE these medications         cetirizine 10 MG tablet   Commonly known as: ZYRTEC   Take 10 mg by mouth daily.      doxycycline 100 MG tablet   Commonly known as: VIBRA-TABS   Take 1 tablet (100 mg total) by mouth 2 (two) times daily.      furosemide 40 MG tablet   Commonly known as: LASIX   Take 1 tablet (40 mg total) by mouth 2 (two) times daily.      HYDROcodone-acetaminophen 5-325 MG per tablet   Commonly known as: NORCO/VICODIN   Take 1 tablet by mouth every 6 (six) hours as needed for pain.      HYDROcodone-acetaminophen 5-325 MG per tablet   Commonly known as: NORCO/VICODIN   Take 1 tablet by mouth every 8 (eight) hours as needed (severe pain).      metoprolol 50 MG tablet   Commonly known as: LOPRESSOR   Take 50 mg by mouth 2 (two) times daily.      potassium chloride 10 MEQ tablet   Commonly known as: K-DUR   Take 2 tablets (20 mEq total) by mouth daily.      STOOL SOFTENER 100 MG capsule   Generic drug: docusate sodium   Take 100 mg by mouth every other day.      vitamin C 500 MG tablet   Commonly known as: ASCORBIC ACID   Take 500 mg by mouth daily.            Discharge Orders    Future Appointments: Provider: Department: Dept Phone: Center:   08/24/2012 10:45 AM Carole Civil, MD [facility] [phone] [facility]     Future Orders Please Complete By Expires   Activity as tolerated - No restrictions      Discharge instructions      Comments:   Eat low calorie, small portion meals and snacks.  Increase aerobic activity (exercise/walk) to a goal of at least 20 minutes 5 times per week       Follow-up Information    Follow up with [facility]. (for ovarian cyst)    Contact information:   [place] [phone]          Disposition: 01-Home or Self Care  Discharged Condition: stable  Consults:  Dietician  Labs:   Results for orders placed during the hospital encounter of 08/16/12 (from the past 48 hour(s))  URINALYSIS, ROUTINE W REFLEX MICROSCOPIC     Status: Abnormal   Collection Time   08/16/12  9:09 PM      Component Value Range Comment   Color, Urine AMBER (*) YELLOW BIOCHEMICALS MAY BE AFFECTED BY COLOR   APPearance HAZY (*) CLEAR    Specific Gravity, Urine 1.015  1.005 - 1.030    pH 6.0  5.0 - 8.0    Glucose, UA NEGATIVE  NEGATIVE mg/dL    Hgb urine dipstick NEGATIVE  NEGATIVE    Bilirubin Urine NEGATIVE  NEGATIVE    Ketones, ur NEGATIVE  NEGATIVE mg/dL    Protein, ur NEGATIVE  NEGATIVE mg/dL  Urobilinogen, UA 0.2  0.0 - 1.0 mg/dL    Nitrite NEGATIVE  NEGATIVE    Leukocytes, UA NEGATIVE  NEGATIVE MICROSCOPIC NOT DONE ON URINES WITH NEGATIVE PROTEIN, BLOOD, LEUKOCYTES, NITRITE, OR GLUCOSE <1000 mg/dL.  PREGNANCY, URINE     Status: Normal   Collection Time   08/16/12 10:08 PM      Component Value Range Comment   Preg Test, Ur NEGATIVE  NEGATIVE   CBC WITH DIFFERENTIAL     Status: Abnormal   Collection Time   08/16/12 11:58 PM      Component Value Range Comment   WBC 12.1 (*) 4.0 - 10.5 K/uL    RBC 4.95  3.87 - 5.11 MIL/uL    Hemoglobin 14.0  12.0 - 15.0 g/dL    HCT 42.8  36.0 - 46.0 %    MCV 86.5  78.0 - 100.0 fL    MCH 28.3  26.0 - 34.0 pg    MCHC 32.7  30.0 - 36.0 g/dL    RDW 15.6 (*) 11.5 - 15.5 %    Platelets 167  150 - 400 K/uL    Neutrophils Relative 65  43 - 77 %    Neutro Abs 7.8 (*) 1.7 - 7.7 K/uL    Lymphocytes Relative 28  12 - 46 %    Lymphs Abs 3.4  0.7 - 4.0 K/uL    Monocytes Relative 5  3 - 12 %    Monocytes Absolute 0.6  0.1 - 1.0 K/uL    Eosinophils Relative 2  0 - 5 %     Eosinophils Absolute 0.3  0.0 - 0.7 K/uL    Basophils Relative 0  0 - 1 %    Basophils Absolute 0.0  0.0 - 0.1 K/uL   COMPREHENSIVE METABOLIC PANEL     Status: Abnormal   Collection Time   08/16/12 11:58 PM      Component Value Range Comment   Sodium 138  135 - 145 mEq/L    Potassium 3.5  3.5 - 5.1 mEq/L    Chloride 97  96 - 112 mEq/L    CO2 31  19 - 32 mEq/L    Glucose, Bld 107 (*) 70 - 99 mg/dL    BUN 14  6 - 23 mg/dL    Creatinine, Ser 0.86  0.50 - 1.10 mg/dL    Calcium 9.7  8.4 - 10.5 mg/dL    Total Protein 7.8  6.0 - 8.3 g/dL    Albumin 3.7  3.5 - 5.2 g/dL    AST 47 (*) 0 - 37 U/L    ALT 80 (*) 0 - 35 U/L    Alkaline Phosphatase 100  39 - 117 U/L    Total Bilirubin 0.7  0.3 - 1.2 mg/dL    GFR calc non Af Amer >90  >90 mL/min    GFR calc Af Amer >90  >90 mL/min   MRSA PCR SCREENING     Status: Normal   Collection Time   08/17/12  5:53 AM      Component Value Range Comment   MRSA by PCR NEGATIVE  NEGATIVE     Diagnostics:  Ct Abdomen Pelvis Wo Contrast  08/16/2012  *RADIOLOGY REPORT*  Clinical Data: Right lower quadrant and left flank pain.  CT ABDOMEN AND PELVIS WITHOUT CONTRAST  Technique:  Multidetector CT imaging of the abdomen and pelvis was performed following the standard protocol without intravenous contrast.  Comparison: None.  Findings: The lung bases are clear.  The kidneys appear symmetrical in size and shape.  No pyelocaliectasis or ureterectasis.  No renal, ureteral, or bladder stones identified.  The bladder is decompressed and cannot be evaluated for wall thickness.  The surgical absence of the gallbladder.  Low attenuation change in the liver consistent with diffuse fatty infiltration.  The unenhanced appearance of the liver, spleen, pancreas, adrenal glands, abdominal aorta, and retroperitoneal lymph nodes is otherwise unremarkable.  Prominent visceral adipose tissues.  No free fluid or free air in the abdomen.  The stomach, small bowel, and colon are not abnormally  distended.  Abdominal wall musculature appears intact.  Pelvis:  There is a hypodense left adnexal mass measuring 6.5 cm diameter.  Density measurements suggest a cystic lesion.  If clinically indicated, this could be further characterized at ultrasound.  No right adnexal masses.  Uterus is not enlarged.  No free or loculated pelvic fluid collections.  No significant pelvic lymphadenopathy.  The appendix is normal.  No evidence of diverticulitis.  Degenerative changes in the thoracolumbar spine.  IMPRESSION: No renal or ureteral stone or obstruction.  No inflammatory changes demonstrated.  Diffuse fatty infiltration of the liver.  Probable cystic left ovarian mass measuring 6.5 cm diameter.   Original Report Authenticated By: Neale Burly, M.D.    Mr Thoracic Spine Wo Contrast  07/31/2012  *RADIOLOGY REPORT*  Clinical Data:  Right-sided back pain.  Fall.  Crushed vertebra 4 months ago.  MRI THORACIC AND LUMBAR SPINE WITHOUT CONTRAST  Technique:  Multiplanar and multiecho pulse sequences of the thoracic and lumbar spine were obtained without intravenous contrast.  Comparison:  05/25/2012.  MRI THORACIC SPINE  Findings: There is a dextroconvex thoracic curvature with the apex at T7.  Paraspinal soft tissues demonstrate mediastinal lipomatosis.  Epidural lipomatosis is also present without thoracic cord flattening.  There is no bone marrow edema.  Benign vertebral body hemangioma present at C7.  The chronic T11 and T12 compression fractures are present with 25% loss of anterior vertebral body height at both levels.  Otherwise vertebral body height is preserved.  Thoracic intervertebral discs show mild age advanced degenerative change.  Small T8-T9 right paracentral disc protrusion is present without significant stenosis or cord deformity. Protrusion just contacts the ventral aspect of the thoracic cord on the right.  Neural foramina appear patent.  Tiny left T5 paracentral to posterolateral disc protrusion  without stenosis.  On inversion recovery images, there is mild paraspinal edema at T8- T9 and T9-T10.  On the axial images, there are changes compatible with disc and osteophyte formation in the left anterolateral region.  These changes may be secondary to traumatic disc protrusions or developing thoracic spine DISH.  IMPRESSION: 1.  Chronic T11 and T12 compression fractures with 25% loss of vertebral body height.  No bone marrow edema to suggest acute or subacute injury. 2.  Mild paravertebral phlegmon at T8-T9 and T9-T10, suggestive of developing DISH or small anterolateral protrusions. 3.  No significant thoracic spinal or foraminal stenosis.  MRI LUMBAR SPINE  Findings: Lumbar spinal alignment is anatomic.  Spinal cord terminates posterior to T12-L1.  Lumbar vertebral body height is preserved.  L1-L2:  Negative.  L2-L3:  Negative.  L3-L4:  Negative.  L4-L5:  Age advanced disc degeneration with disc desiccation. Central canal on lateral recesses patent.  There is a small left foraminal and extraforaminal protrusion that potentially irritates the exited left L4 nerve.  Left foramen is adequately patent.  L5-S1:  Negative.  In the anatomic pelvis, the urinary  bladder is distended.  There is a cystic lesion that is partially visualized in the left anatomic pelvis.  Follow-up pelvic ultrasound recommended.  IMPRESSION: 1.  L4-L5 age advanced degenerative disc disease with disc desiccation and shallow left foraminal and extraforaminal protrusion that potentially irritates the exiting left L4 nerve. 2.  Cystic lesion partially visualized in the left anatomic pelvis which appears distinct from the bladder.  Follow-up pelvic ultrasound recommended for further assessment. 3.  No lumbar spine compression fractures or bone marrow edema.  Original Report Authenticated By: Dereck Ligas, M.D.   Mr Lumbar Spine Wo Contrast  08/02/2012  *RADIOLOGY REPORT*  Clinical Data:  Right-sided back pain.  Fall.  Crushed vertebra 4  months ago.  MRI THORACIC AND LUMBAR SPINE WITHOUT CONTRAST  Technique:  Multiplanar and multiecho pulse sequences of the thoracic and lumbar spine were obtained without intravenous contrast.  Comparison:  05/25/2012.  MRI THORACIC SPINE  Findings: There is a dextroconvex thoracic curvature with the apex at T7.  Paraspinal soft tissues demonstrate mediastinal lipomatosis.  Epidural lipomatosis is also present without thoracic cord flattening.  There is no bone marrow edema.  Benign vertebral body hemangioma present at C7.  The chronic T11 and T12 compression fractures are present with 25% loss of anterior vertebral body height at both levels.  Otherwise vertebral body height is preserved.  Thoracic intervertebral discs show mild age advanced degenerative change.  Small T8-T9 right paracentral disc protrusion is present without significant stenosis or cord deformity. Protrusion just contacts the ventral aspect of the thoracic cord on the right.  Neural foramina appear patent.  Tiny left T5 paracentral to posterolateral disc protrusion without stenosis.  On inversion recovery images, there is mild paraspinal edema at T8- T9 and T9-T10.  On the axial images, there are changes compatible with disc and osteophyte formation in the left anterolateral region.  These changes may be secondary to traumatic disc protrusions or developing thoracic spine DISH.  IMPRESSION: 1.  Chronic T11 and T12 compression fractures with 25% loss of vertebral body height.  No bone marrow edema to suggest acute or subacute injury. 2.  Mild paravertebral phlegmon at T8-T9 and T9-T10, suggestive of developing DISH or small anterolateral protrusions. 3.  No significant thoracic spinal or foraminal stenosis.  MRI LUMBAR SPINE  Findings: Lumbar spinal alignment is anatomic.  Spinal cord terminates posterior to T12-L1.  Lumbar vertebral body height is preserved.  L1-L2:  Negative.  L2-L3:  Negative.  L3-L4:  Negative.  L4-L5:  Age advanced disc  degeneration with disc desiccation. Central canal on lateral recesses patent.  There is a small left foraminal and extraforaminal protrusion that potentially irritates the exited left L4 nerve.  Left foramen is adequately patent.  L5-S1:  Negative.  In the anatomic pelvis, the urinary bladder is distended.  There is a cystic lesion that is partially visualized in the left anatomic pelvis.  Follow-up pelvic ultrasound recommended.  IMPRESSION: 1.  L4-L5 age advanced degenerative disc disease with disc desiccation and shallow left foraminal and extraforaminal protrusion that potentially irritates the exiting left L4 nerve. 2.  Cystic lesion partially visualized in the left anatomic pelvis which appears distinct from the bladder.  Follow-up pelvic ultrasound recommended for further assessment. 3.  No lumbar spine compression fractures or bone marrow edema.  Original Report Authenticated By: Dereck Ligas, M.D.   Full Code   Hospital Course: See H&P for complete admission details. The patient is a 26 year old morbidly obese white female weighing over her 370 pounds  who presented with left sided flank and abdominal pain. Also lower abdominal wall pain. She has a history of painful ovarian cysts. CT of the abdomen and pelvis showed cystic lesion of the left anatomic pelvis. Also degenerative disc disease. She also had cellulitis involving her pannus on physical examination. Patient was started on IV vancomycin. She takes Lasix once a day as an outpatient. It showed a lot of induration and swelling of the area. Her Lasix was increased to twice daily. I suspect she has undiagnosed sleep apnea, and may have right heart strain from this as well as morbid obesity. Her cellulitis has improved but not completely resolved. She is stable to complete oral antibiotic therapy at home. She has not had a pelvic exam for several years. She may followup with gynecology as an outpatient for her ovarian cyst. She was counseled on  weight loss strategies. I also recommended she have an outpatient polysomnogram which can hopefully be set up by the health department. Because she is uninsured, she has difficulty having tests done in sitting specialist due to cost.  Discharge Exam:  Blood pressure 101/66, pulse 79, temperature 98 F (36.7 C), temperature source Oral, resp. rate 18, height 5' 2"  (1.575 m), weight 171.686 kg (378 lb 8 oz), last menstrual period 01/17/2012, SpO2 99.00%.  Pannus with less peau d'orange, less edema, less erythema  Signed: Khandi Kernes L 08/18/2012, 9:14 AM

## 2012-08-18 NOTE — Progress Notes (Addendum)
Patient discharged home with family on PO antibiotics.  Patient instructed on new medications and changes  To previous ones.  In[facility]ed [facility] and has appt scheduled to foolow up with OB/GYN in place.  Instructed to double Lasix dose for next 3-4 days.  Instructed to exercise with walking 20 minutes daily and to eat 6 lower calorie small meals throughout the day.  Patient has no questions at this time. Stable for discharge.

## 2012-08-24 ENCOUNTER — Ambulatory Visit: Payer: Self-pay | Admitting: Orthopedic Surgery

## 2012-09-06 ENCOUNTER — Telehealth: Payer: Self-pay | Admitting: Orthopedic Surgery

## 2012-09-06 ENCOUNTER — Encounter: Payer: Self-pay | Admitting: Orthopedic Surgery

## 2012-09-06 NOTE — Telephone Encounter (Signed)
Patient called to check on status of referral to [facility], (due to first 2 specialists' offices declining appointment.)   Also, patient requests updated work note -- note has been entered, printed, and patient aware for pick up.

## 2012-09-07 NOTE — Telephone Encounter (Signed)
Patient is aware that [facility] will call when opening is available

## 2012-09-23 ENCOUNTER — Telehealth: Payer: Self-pay | Admitting: Orthopedic Surgery

## 2012-09-23 NOTE — Telephone Encounter (Signed)
New phone # for Heidi Landry.  She has not heard from referral to [facility]. Her new # [phone]

## 2012-09-27 NOTE — Telephone Encounter (Signed)
Called patient, left message.

## 2012-09-28 NOTE — Telephone Encounter (Signed)
Spoke with patient and advised she is on waiting list, gave her contact # to call and follow up and provide # change

## 2012-10-29 ENCOUNTER — Encounter: Payer: Self-pay | Admitting: Orthopedic Surgery

## 2012-12-24 ENCOUNTER — Other Ambulatory Visit: Payer: Self-pay | Admitting: Obstetrics and Gynecology

## 2012-12-24 DIAGNOSIS — N83202 Unspecified ovarian cyst, left side: Secondary | ICD-10-CM

## 2013-01-14 ENCOUNTER — Ambulatory Visit (HOSPITAL_COMMUNITY): Payer: Self-pay

## 2013-01-14 ENCOUNTER — Ambulatory Visit (HOSPITAL_COMMUNITY)
Admission: RE | Admit: 2013-01-14 | Discharge: 2013-01-14 | Disposition: A | Discharge status: Home / Self Care | Payer: Self-pay | Source: Ambulatory Visit | Attending: Obstetrics and Gynecology | Admitting: Obstetrics and Gynecology

## 2013-01-14 DIAGNOSIS — R9389 Abnormal findings on diagnostic imaging of other specified body structures: Secondary | ICD-10-CM | POA: Insufficient documentation

## 2013-01-14 DIAGNOSIS — N83202 Unspecified ovarian cyst, left side: Secondary | ICD-10-CM

## 2013-01-14 DIAGNOSIS — R1032 Left lower quadrant pain: Secondary | ICD-10-CM | POA: Insufficient documentation

## 2013-08-22 ENCOUNTER — Other Ambulatory Visit: Payer: Self-pay | Admitting: Obstetrics and Gynecology

## 2013-08-22 ENCOUNTER — Encounter: Payer: Self-pay | Admitting: *Deleted

## 2014-03-19 ENCOUNTER — Emergency Department (HOSPITAL_COMMUNITY)
Admission: EM | Admit: 2014-03-19 | Discharge: 2014-03-19 | Disposition: A | Discharge status: Home / Self Care | Payer: Self-pay | Attending: Emergency Medicine | Admitting: Emergency Medicine

## 2014-03-19 ENCOUNTER — Encounter (HOSPITAL_COMMUNITY): Payer: Self-pay | Admitting: Emergency Medicine

## 2014-03-19 DIAGNOSIS — IMO0002 Reserved for concepts with insufficient information to code with codable children: Secondary | ICD-10-CM | POA: Insufficient documentation

## 2014-03-19 DIAGNOSIS — Z791 Long term (current) use of non-steroidal anti-inflammatories (NSAID): Secondary | ICD-10-CM | POA: Insufficient documentation

## 2014-03-19 DIAGNOSIS — R209 Unspecified disturbances of skin sensation: Secondary | ICD-10-CM | POA: Insufficient documentation

## 2014-03-19 DIAGNOSIS — I10 Essential (primary) hypertension: Secondary | ICD-10-CM | POA: Insufficient documentation

## 2014-03-19 DIAGNOSIS — E669 Obesity, unspecified: Secondary | ICD-10-CM | POA: Insufficient documentation

## 2014-03-19 DIAGNOSIS — M479 Spondylosis, unspecified: Secondary | ICD-10-CM | POA: Insufficient documentation

## 2014-03-19 DIAGNOSIS — Z79899 Other long term (current) drug therapy: Secondary | ICD-10-CM | POA: Insufficient documentation

## 2014-03-19 DIAGNOSIS — Z87828 Personal history of other (healed) physical injury and trauma: Secondary | ICD-10-CM | POA: Insufficient documentation

## 2014-03-19 DIAGNOSIS — G8929 Other chronic pain: Secondary | ICD-10-CM | POA: Insufficient documentation

## 2014-03-19 DIAGNOSIS — F17200 Nicotine dependence, unspecified, uncomplicated: Secondary | ICD-10-CM | POA: Insufficient documentation

## 2014-03-19 DIAGNOSIS — R202 Paresthesia of skin: Secondary | ICD-10-CM

## 2014-03-19 DIAGNOSIS — M549 Dorsalgia, unspecified: Secondary | ICD-10-CM | POA: Insufficient documentation

## 2014-03-19 HISTORY — DX: Spondylosis, unspecified: M47.9

## 2014-03-19 HISTORY — DX: Reserved for concepts with insufficient information to code with codable children: IMO0002

## 2014-03-19 MED ORDER — BACLOFEN 10 MG PO TABS: 10.0000 mg | ORAL_TABLET | Freq: Three times a day (TID) | ORAL | Status: AC

## 2014-03-19 MED ORDER — DICLOFENAC SODIUM 75 MG PO TBEC: 75.0000 mg | DELAYED_RELEASE_TABLET | Freq: Two times a day (BID) | ORAL | Status: DC

## 2014-03-19 MED ORDER — KETOROLAC TROMETHAMINE 10 MG PO TABS
10.0000 mg | ORAL_TABLET | Freq: Once | ORAL | Status: AC
  Administered 2014-03-19: 10 mg via ORAL
  Filled 2014-03-19: qty 1

## 2014-03-19 MED ORDER — DIAZEPAM 5 MG PO TABS
5.0000 mg | ORAL_TABLET | Freq: Once | ORAL | Status: AC
  Administered 2014-03-19: 5 mg via ORAL
  Filled 2014-03-19: qty 1
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
## 2014-03-19 NOTE — Discharge Instructions (Signed)
Please call the hospital to speak with the medical social worker on tomorrow. It is important that you speak with an orthopedist or spine specialist concerning her condition. Please apply heat to your back. Please use baclofen and diclofenac for the discomfort. Baclofen may cause drowsiness, please use with caution. Paresthesia Paresthesia is an abnormal burning or prickling sensation. This sensation is generally felt in the hands, arms, legs, or feet. However, it may occur in any part of the body. It is usually not painful. The feeling may be described as:  Tingling or numbness.  "Pins and needles."  Skin crawling.  Buzzing.  Limbs "falling asleep."  Itching. Most people experience temporary (transient) paresthesia at some time in their lives. CAUSES  Paresthesia may occur when you breathe too quickly (hyperventilation). It can also occur without any apparent cause. Commonly, paresthesia occurs when pressure is placed on a nerve. The feeling quickly goes away once the pressure is removed. For some people, however, paresthesia is a long-lasting (chronic) condition caused by an underlying disorder. The underlying disorder may be:  A traumatic, direct injury to nerves. Examples include a:  Broken (fractured) neck.  Fractured skull.  A disorder affecting the brain and spinal cord (central nervous system). Examples include:  Transverse myelitis.  Encephalitis.  Transient ischemic attack.  Multiple sclerosis.  Stroke.  Tumor or blood vessel problems, such as an arteriovenous malformation pressing against the brain or spinal cord.  A condition that damages the peripheral nerves (peripheral neuropathy). Peripheral nerves are not part of the brain and spinal cord. These conditions include:  Diabetes.  Peripheral vascular disease.  Nerve entrapment syndromes, such as carpal tunnel syndrome.  Shingles.  Hypothyroidism.  Vitamin B12 deficiencies.  Alcoholism.  Heavy metal  poisoning (lead, arsenic).  Rheumatoid arthritis.  Systemic lupus erythematosus. DIAGNOSIS  Your caregiver will attempt to find the underlying cause of your paresthesia. Your caregiver may:  Take your medical history.  Perform a physical exam.  Order various lab tests.  Order imaging tests. TREATMENT  Treatment for paresthesia depends on the underlying cause. HOME CARE INSTRUCTIONS  Avoid drinking alcohol.  You may consider massage or acupuncture to help relieve your symptoms.  Keep all follow-up appointments as directed by your caregiver. SEEK IMMEDIATE MEDICAL CARE IF:   You feel weak.  You have trouble walking or moving.  You have problems with speech or vision.  You feel confused.  You cannot control your bladder or bowel movements.  You feel numbness after an injury.  You faint.  Your burning or prickling feeling gets worse when walking.  You have pain, cramps, or dizziness.  You develop a rash. MAKE SURE YOU:  Understand these instructions.  Will watch your condition.  Will get help right away if you are not doing well or get worse. Document Released: 12/05/2002 Document Revised: 03/08/2012 Document Reviewed: 09/05/2011 [facility] Patient Information 2014 [place].

## 2014-03-19 NOTE — ED Notes (Signed)
Patient complaining of back pain with bilateral upper extremity numbness x 2 weeks. Has history of chronic back pain.

## 2014-03-20 NOTE — ED Provider Notes (Signed)
Medical screening examination/treatment/procedure(s) were performed by non-physician practitioner and as supervising physician I was immediately available for consultation/collaboration.   EKG Interpretation None        Merryl Hacker, MD 03/20/14 1438

## 2014-07-11 ENCOUNTER — Encounter (HOSPITAL_COMMUNITY): Payer: Self-pay | Admitting: Emergency Medicine

## 2014-07-11 ENCOUNTER — Emergency Department (HOSPITAL_COMMUNITY): Payer: Worker's Compensation

## 2014-07-11 ENCOUNTER — Emergency Department (HOSPITAL_COMMUNITY)
Admission: EM | Admit: 2014-07-11 | Discharge: 2014-07-11 | Disposition: A | Discharge status: Home / Self Care | Payer: Worker's Compensation | Attending: Emergency Medicine | Admitting: Emergency Medicine

## 2014-07-11 DIAGNOSIS — F17200 Nicotine dependence, unspecified, uncomplicated: Secondary | ICD-10-CM | POA: Diagnosis not present

## 2014-07-11 DIAGNOSIS — M65849 Other synovitis and tenosynovitis, unspecified hand: Secondary | ICD-10-CM | POA: Diagnosis not present

## 2014-07-11 DIAGNOSIS — Z79899 Other long term (current) drug therapy: Secondary | ICD-10-CM | POA: Diagnosis not present

## 2014-07-11 DIAGNOSIS — M65839 Other synovitis and tenosynovitis, unspecified forearm: Secondary | ICD-10-CM | POA: Diagnosis not present

## 2014-07-11 DIAGNOSIS — IMO0002 Reserved for concepts with insufficient information to code with codable children: Secondary | ICD-10-CM | POA: Diagnosis not present

## 2014-07-11 DIAGNOSIS — I10 Essential (primary) hypertension: Secondary | ICD-10-CM | POA: Insufficient documentation

## 2014-07-11 DIAGNOSIS — Z791 Long term (current) use of non-steroidal anti-inflammatories (NSAID): Secondary | ICD-10-CM | POA: Diagnosis not present

## 2014-07-11 DIAGNOSIS — M25539 Pain in unspecified wrist: Secondary | ICD-10-CM | POA: Diagnosis present

## 2014-07-11 DIAGNOSIS — M779 Enthesopathy, unspecified: Secondary | ICD-10-CM

## 2014-07-11 MED ORDER — HYDROCODONE-ACETAMINOPHEN 5-325 MG PO TABS
2.0000 | ORAL_TABLET | Freq: Once | ORAL | Status: AC
  Administered 2014-07-11: 2 via ORAL
  Filled 2014-07-11: qty 2

## 2014-07-11 MED ORDER — TRAMADOL HCL 50 MG PO TABS: 50.0000 mg | ORAL_TABLET | Freq: Four times a day (QID) | ORAL | Status: DC | PRN

## 2014-07-11 NOTE — ED Notes (Signed)
Patient c/o left wrist pain into thumb. Patient states "i can feel nerves running" talking about the pain in her wrist. Patient has + radial pulse and capillary refill is brisk to affected extremity.

## 2014-07-11 NOTE — ED Notes (Signed)
Patient in a position of comfort. Family member at bedside. Soda given to patient and family member at request. No other needs voiced at this time.

## 2014-07-11 NOTE — Discharge Instructions (Signed)
De Quervain's Disease Harriet Pho disease is a condition often seen in racquet sports where there is a soreness (inflammation) in the cord like structures (tendons) which attach muscle to bone on the thumb side of the wrist. There may be a tightening of the tissuesaround the tendons. This condition is often helped by giving up or modifying the activity which caused it. When conservative treatment does not help, surgery may be required. Conservative treatment could include changes in the activity which brought about the problem or made it worse. Anti-inflammatory medications and injections may be used to help decrease the inflammation and help with pain control. Your caregiver will help you determine which is best for you. DIAGNOSIS  Often the diagnosis (learning what is wrong) can be made by examination. Sometimes x-rays are required. HOME CARE INSTRUCTIONS   Apply ice to the sore area for 15-20 minutes, 03-04 times per day while awake. Put the ice in a plastic bag and place a towel between the bag of ice and your skin. This is especially helpful if it can be done after all activities involving the sore wrist.  Temporary splinting may help.  Only take over-the-counter or prescription medicines for pain, discomfort or fever as directed by your caregiver. SEEK MEDICAL CARE IF:   Pain relief is not obtained with medications, or if you have increasing pain and seem to be getting worse rather than better. MAKE SURE YOU:   Understand these instructions.  Will watch your condition.  Will get help right away if you are not doing well or get worse. Document Released: 09/09/2001 Document Revised: 03/08/2012 Document Reviewed: 12/15/2005 [facility] Patient Information 2015 [place], [place]. This information is not intended to replace advice given to you by your health care provider. Make sure you discuss any questions you have with your health care provider.

## 2014-07-11 NOTE — ED Provider Notes (Signed)
CSN: [id number]     Arrival date & time 07/11/14  2132 History  This chart was scribed for Orpah Greek, * by Jeanell Sparrow, ED Scribe. This patient was seen in room APA04/APA04 and the patient's care was started at 9:49 PM.   Chief Complaint  Patient presents with  . Wrist Pain   HPI HPI Comments: Heidi Landry is a 28 y.o. female who presents to the Emergency Department complaining of constant moderate left wrist pain that started two days ago. She states that she attempted to pick up a heavy trash bag and felt a pain onset. She states that the pain radiates to her forearm and thumb. She reports that ice relieves the pain and movement exacerbates it.   Past Medical History  Diagnosis Date  . Hypertension   . Edema   . Degenerative disc disease   . Spondylosis    Past Surgical History  Procedure Laterality Date  . Cholecystectomy     Family History  Problem Relation Age of Onset  . Cancer Other   . Hypertension Other   . Thyroid disease Other   . Hyperlipidemia Other   . Diabetes    . Lung disease    . Arthritis     History  Substance Use Topics  . Smoking status: Current Every Day Smoker -- 1.00 packs/day for 4 years    Types: Cigarettes  . Smokeless tobacco: Never Used  . Alcohol Use: No   OB History   Grav Para Term Preterm Abortions TAB SAB Ect Mult Living            0     Review of Systems  Musculoskeletal: Positive for arthralgias.    Allergies  Review of patient's allergies indicates no known allergies.  Home Medications   Prior to Admission medications   Medication Sig Start Date End Date Taking? Authorizing Provider  fluconazole (DIFLUCAN) 150 MG tablet Take 150 mg by mouth daily.   Yes Historical Provider, MD  ibuprofen (ADVIL,MOTRIN) 200 MG tablet Take 800 mg by mouth every 6 (six) hours as needed.   Yes Historical Provider, MD  naproxen (NAPROSYN) 500 MG tablet Take 500 mg by mouth 2 (two) times daily with a meal.   Yes Historical  Provider, MD  potassium chloride (K-DUR) 10 MEQ tablet Take 2 tablets (20 mEq total) by mouth daily. 08/18/12 08/18/13  Delfina Redwood, MD   BP 153/87  Pulse 93  Temp(Src) 97.9 F (36.6 C) (Oral)  Resp 18  Ht 5' 2"  (1.575 m)  Wt 290 lb (131.543 kg)  BMI 53.03 kg/m2  SpO2 98%  LMP 06/27/2014 Physical Exam  Constitutional: She is oriented to person, place, and time.  Musculoskeletal: She exhibits tenderness.       Right wrist: She exhibits decreased range of motion, tenderness and swelling. She exhibits no deformity.       Arms: Positive Finkelstein test  Neurological: She is alert and oriented to person, place, and time. No cranial nerve deficit.  Skin: Skin is warm and dry. No rash noted.    ED Course  Procedures (including critical care time) DIAGNOSTIC STUDIES: Oxygen Saturation is 98% on RA, normal by my interpretation.    COORDINATION OF CARE: 9:53 PM- Pt advised of plan for treatment which includes radiology and pt agrees.  Labs Review Labs Reviewed - No data to display  Imaging Review Dg Wrist Complete Left  07/11/2014   CLINICAL DATA:  Wrist pain  EXAM: LEFT WRIST -  COMPLETE 3+ VIEW  COMPARISON:  None.  FINDINGS: There is no evidence of fracture or dislocation. There is no evidence of arthropathy or other focal bone abnormality. Soft tissues are unremarkable.  IMPRESSION: Negative.   Electronically Signed   By: Curlene Dolphin M.D.   On: 07/11/2014 22:31     EKG Interpretation None      MDM   Final diagnoses:  None   tendinitis  Presents to ER for evaluation of pain in the right wrist area after picking up a heavy object. X-ray is negative. Examination reveals soft tissue tenderness over the radial aspect of the wrist and a positive Finkelstein test. This is consistent with tendinitis. Treated with rest and analgesia, and sweats.  I personally performed the services described in this documentation, which was scribed in my presence. The recorded information  has been reviewed and is accurate.     Orpah Greek, MD 07/11/14 2308

## 2014-07-11 NOTE — ED Notes (Signed)
Patient verbalizes understanding of discharge instructions, prescription medications, splint care and follow up information. Patient ambulatory out of department at this time with family member

## 2014-07-11 NOTE — ED Notes (Signed)
Pt now complaining of  Intermittent numbness to arm.

## 2014-07-11 NOTE — ED Notes (Signed)
On Saturday lifted a large bag of trash at work, since then have pain in left wrist. When to [facility] yesterday, put in arm brace, did not xray. Pain is no better

## 2014-10-12 ENCOUNTER — Emergency Department (HOSPITAL_COMMUNITY)
Admission: EM | Admit: 2014-10-12 | Discharge: 2014-10-12 | Disposition: A | Discharge status: Home / Self Care | Payer: Self-pay | Attending: Emergency Medicine | Admitting: Emergency Medicine

## 2014-10-12 ENCOUNTER — Encounter (HOSPITAL_COMMUNITY): Payer: Self-pay | Admitting: Emergency Medicine

## 2014-10-12 ENCOUNTER — Emergency Department (HOSPITAL_COMMUNITY): Payer: Self-pay

## 2014-10-12 DIAGNOSIS — Z79899 Other long term (current) drug therapy: Secondary | ICD-10-CM | POA: Insufficient documentation

## 2014-10-12 DIAGNOSIS — I10 Essential (primary) hypertension: Secondary | ICD-10-CM | POA: Insufficient documentation

## 2014-10-12 DIAGNOSIS — R0789 Other chest pain: Secondary | ICD-10-CM | POA: Insufficient documentation

## 2014-10-12 DIAGNOSIS — J9801 Acute bronchospasm: Secondary | ICD-10-CM | POA: Insufficient documentation

## 2014-10-12 DIAGNOSIS — Z792 Long term (current) use of antibiotics: Secondary | ICD-10-CM | POA: Insufficient documentation

## 2014-10-12 DIAGNOSIS — Z8739 Personal history of other diseases of the musculoskeletal system and connective tissue: Secondary | ICD-10-CM | POA: Insufficient documentation

## 2014-10-12 DIAGNOSIS — Z7952 Long term (current) use of systemic steroids: Secondary | ICD-10-CM | POA: Insufficient documentation

## 2014-10-12 MED ORDER — PREDNISONE 50 MG PO TABS
60.0000 mg | ORAL_TABLET | Freq: Once | ORAL | Status: AC
  Administered 2014-10-12: 60 mg via ORAL
  Filled 2014-10-12 (×2): qty 1

## 2014-10-12 MED ORDER — IPRATROPIUM-ALBUTEROL 0.5-2.5 (3) MG/3ML IN SOLN
3.0000 mL | Freq: Once | RESPIRATORY_TRACT | Status: AC
  Administered 2014-10-12: 3 mL via RESPIRATORY_TRACT
  Filled 2014-10-12: qty 3

## 2014-10-12 MED ORDER — AZITHROMYCIN 250 MG PO TABS: 250.0000 mg | ORAL_TABLET | Freq: Every day | ORAL | Status: DC

## 2014-10-12 MED ORDER — ALBUTEROL SULFATE (2.5 MG/3ML) 0.083% IN NEBU
2.5000 mg | INHALATION_SOLUTION | Freq: Once | RESPIRATORY_TRACT | Status: AC
  Administered 2014-10-12: 2.5 mg via RESPIRATORY_TRACT
  Filled 2014-10-12: qty 3

## 2014-10-12 MED ORDER — ALBUTEROL SULFATE HFA 108 (90 BASE) MCG/ACT IN AERS
2.0000 | INHALATION_SPRAY | Freq: Once | RESPIRATORY_TRACT | Status: AC
  Administered 2014-10-12: 2 via RESPIRATORY_TRACT
  Filled 2014-10-12: qty 6.7

## 2014-10-12 MED ORDER — PREDNISONE 10 MG PO TABS: ORAL_TABLET | ORAL | Status: DC

## 2014-10-12 MED ORDER — GUAIFENESIN-CODEINE 100-10 MG/5ML PO SYRP: 10.0000 mL | ORAL_SOLUTION | Freq: Three times a day (TID) | ORAL | Status: DC | PRN

## 2014-10-12 NOTE — ED Notes (Addendum)
Cough, nonproductive. Back and chest "sore"  wheeze

## 2014-10-12 NOTE — Discharge Instructions (Signed)
Bronchospasm A bronchospasm is when the tubes that carry air in and out of your lungs (airways) spasm or tighten. During a bronchospasm it is hard to breathe. This is because the airways get smaller. A bronchospasm can be triggered by:  Allergies. These may be to animals, pollen, food, or mold.  Infection. This is a common cause of bronchospasm.  Exercise.  Irritants. These include pollution, cigarette smoke, strong odors, aerosol sprays, and paint fumes.  Weather changes.  Stress.  Being emotional. HOME CARE   Always have a plan for getting help. Know when to call your doctor and local emergency services (911 in the [place] Know where you can get emergency care.  Only take medicines as told by your doctor.  If you were prescribed an inhaler or nebulizer machine, ask your doctor how to use it correctly. Always use a spacer with your inhaler if you were given one.  Stay calm during an attack. Try to relax and breathe more slowly.  Control your home environment:  Change your heating and air conditioning filter at least once a month.  Limit your use of fireplaces and wood stoves.  Do not  smoke. Do not  allow smoking in your home.  Avoid perfumes and fragrances.  Get rid of pests (such as roaches and mice) and their droppings.  Throw away plants if you see mold on them.  Keep your house clean and dust free.  Replace carpet with wood, tile, or vinyl flooring. Carpet can trap dander and dust.  Use allergy-proof pillows, mattress covers, and box spring covers.  Wash bed sheets and blankets every week in hot water. Dry them in a dryer.  Use blankets that are made of polyester or cotton.  Wash hands frequently. GET HELP IF:  You have muscle aches.  You have chest pain.  The thick spit you spit or cough up (sputum) changes from clear or white to yellow, green, gray, or bloody.  The thick spit you spit or cough up gets thicker.  There are problems that may be related  to the medicine you are given such as:  A rash.  Itching.  Swelling.  Trouble breathing. GET HELP RIGHT AWAY IF:  You feel you cannot breathe or catch your breath.  You cannot stop coughing.  Your treatment is not helping you breathe better.  You have very bad chest pain. MAKE SURE YOU:   Understand these instructions.  Will watch your condition.  Will get help right away if you are not doing well or get worse. Document Released: 10/12/2009 Document Revised: 12/20/2013 Document Reviewed: 06/07/2013 [employer] Patient Information 2015 [employer]. This information is not intended to replace advice given to you by your health care provider. Make sure you discuss any questions you have with your health care provider.  

## 2014-10-14 NOTE — ED Provider Notes (Signed)
CSN: [id number]     Arrival date & time 10/12/14  1846 History   First MD Initiated Contact with Patient 10/12/14 2009     Chief Complaint  Patient presents with  . Cough     (Consider location/radiation/quality/duration/timing/severity/associated sxs/prior Treatment) HPI Heidi Landry is a 28 y.o. female who presents to the Emergency Department complaining of cough and chest congestion for one week.  States the cough has been productive at times and she also notes wheezing and "soreness" to her upper chest and back. She denies fever, vomiting,  Shortness of breath or abdominal pain.  She has tried OTC cold medications w/o relief.   Past Medical History  Diagnosis Date  . Hypertension   . Edema   . Degenerative disc disease   . Spondylosis    Past Surgical History  Procedure Laterality Date  . Cholecystectomy     Family History  Problem Relation Age of Onset  . Cancer Other   . Hypertension Other   . Thyroid disease Other   . Hyperlipidemia Other   . Diabetes    . Lung disease    . Arthritis     History  Substance Use Topics  . Smoking status: Current Every Day Smoker -- 1.00 packs/day for 4 years    Types: Cigarettes  . Smokeless tobacco: Never Used  . Alcohol Use: No   OB History   Grav Para Term Preterm Abortions TAB SAB Ect Mult Living            0     Review of Systems  Constitutional: Negative for fever, chills, activity change and appetite change.  HENT: Positive for congestion, rhinorrhea and sore throat. Negative for facial swelling and trouble swallowing.   Eyes: Negative for visual disturbance.  Respiratory: Positive for cough, chest tightness and wheezing. Negative for shortness of breath and stridor.   Gastrointestinal: Negative for nausea, vomiting and abdominal pain.  Genitourinary: Negative for flank pain.  Musculoskeletal: Negative for neck pain and neck stiffness.  Skin: Negative.  Negative for rash.  Neurological: Negative for dizziness,  weakness, numbness and headaches.  Hematological: Negative for adenopathy.  Psychiatric/Behavioral: Negative for confusion.  All other systems reviewed and are negative.     Allergies  Review of patient's allergies indicates no known allergies.  Home Medications   Prior to Admission medications   Medication Sig Start Date End Date Taking? Authorizing Provider  DM-Phenylephrine-Acetaminophen (ALKA-SELTZER PLS SINUS & COUGH PO) Take 2 tablets by mouth every 6 (six) hours as needed (sinus sx).   Yes Historical Provider, MD  ibuprofen (ADVIL,MOTRIN) 200 MG tablet Take 800 mg by mouth every 6 (six) hours as needed (pain).    Yes Historical Provider, MD  traMADol (ULTRAM) 50 MG tablet Take 50 mg by mouth every 6 (six) hours as needed (pain).   Yes Historical Provider, MD  azithromycin (ZITHROMAX) 250 MG tablet Take 1 tablet (250 mg total) by mouth daily. Take first 2 tablets together, then 1 every day until finished. 10/12/14   Janaiya Beauchesne L. Yasmine Kilbourne, PA-C  guaiFENesin-codeine (ROBITUSSIN AC) 100-10 MG/5ML syrup Take 10 mLs by mouth 3 (three) times daily as needed. 10/12/14   Shaaron Golliday L. Lai Hendriks, PA-C  potassium chloride (K-DUR) 10 MEQ tablet Take 2 tablets (20 mEq total) by mouth daily. 08/18/12 08/18/13  Delfina Redwood, MD  predniSONE (DELTASONE) 10 MG tablet Take 6 tablets day one, 5 tablets day two, 4 tablets day three, 3 tablets day four, 2 tablets day five, then 1  tablet day six 10/12/14   Alea Ryer L. Janelis Stelzer, PA-C   BP 133/62  Pulse 82  Temp(Src) 98.2 F (36.8 C) (Oral)  Resp 20  Ht 5' 2"  (1.575 m)  Wt 304 lb (137.893 kg)  BMI 55.59 kg/m2  SpO2 95%  LMP 09/16/2014 Physical Exam  Nursing note and vitals reviewed. Constitutional: She is oriented to person, place, and time. She appears well-developed and well-nourished. No distress.  HENT:  Head: Normocephalic and atraumatic.  Right Ear: Tympanic membrane and ear canal normal.  Left Ear: Tympanic membrane and ear canal normal.  Nose:  Mucosal edema present.  Mouth/Throat: Uvula is midline and mucous membranes are normal. Posterior oropharyngeal erythema present. No oropharyngeal exudate or posterior oropharyngeal edema.  Eyes: EOM are normal. Pupils are equal, round, and reactive to light.  Neck: Normal range of motion, full passive range of motion without pain and phonation normal. Neck supple.  Cardiovascular: Normal rate, regular rhythm, normal heart sounds and intact distal pulses.   No murmur heard. Pulmonary/Chest: Effort normal. No stridor. No respiratory distress. She has wheezes. She has no rales. She exhibits no tenderness.  Coarse lungs sounds bilaterally.  Inspiratory and expiratory wheezes.    Abdominal: Soft. She exhibits no distension. There is no tenderness. There is no rebound and no guarding.  Musculoskeletal: She exhibits no edema.  Lymphadenopathy:    She has no cervical adenopathy.  Neurological: She is alert and oriented to person, place, and time. She exhibits normal muscle tone. Coordination normal.  Skin: Skin is warm and dry.    ED Course  Procedures (including critical care time) Labs Review Labs Reviewed - No data to display  Imaging Review Dg Chest 2 View  10/12/2014   CLINICAL DATA:  Cough, wheezing, shortness of breath  EXAM: CHEST  2 VIEW  COMPARISON:  05/12/2012  FINDINGS: Lungs are clear.  No pleural effusion or pneumothorax.  Cardiomegaly. Stable prominence of the upper mediastinum/ right paratracheal stripe, chronic.  Mild degenerative changes of the visualized thoracolumbar spine.  IMPRESSION: No evidence of acute cardiopulmonary disease.   Electronically Signed   By: Julian Hy M.D.   On: 10/12/2014 20:37     EKG Interpretation None      MDM   Final diagnoses:  Cough due to bronchospasm    Pt is feeling better after nebulizer treatment.  Pt is non-toxic appearing VSS.  clinical suspicion for PE is low.  Albuterol MDI dispensed, pt agrees to zpack, prednisone taper  and robitussin AC .  She also agrees to return her if needed.      Hayli Milligan L. Vanessa [place], PA-C 10/14/14 2107

## 2014-10-15 NOTE — ED Provider Notes (Signed)
Medical screening examination/treatment/procedure(s) were performed by non-physician practitioner and as supervising physician I was immediately available for consultation/collaboration.   EKG Interpretation None        Maudry Diego, MD 10/15/14 [phone]

## 2014-10-16 ENCOUNTER — Emergency Department (HOSPITAL_COMMUNITY)
Admission: EM | Admit: 2014-10-16 | Discharge: 2014-10-16 | Disposition: A | Discharge status: Home / Self Care | Payer: Self-pay | Attending: Emergency Medicine | Admitting: Emergency Medicine

## 2014-10-16 ENCOUNTER — Emergency Department (HOSPITAL_COMMUNITY): Payer: Self-pay

## 2014-10-16 ENCOUNTER — Encounter (HOSPITAL_COMMUNITY): Payer: Self-pay | Admitting: Emergency Medicine

## 2014-10-16 DIAGNOSIS — Z720 Tobacco use: Secondary | ICD-10-CM | POA: Insufficient documentation

## 2014-10-16 DIAGNOSIS — Z7952 Long term (current) use of systemic steroids: Secondary | ICD-10-CM | POA: Insufficient documentation

## 2014-10-16 DIAGNOSIS — I10 Essential (primary) hypertension: Secondary | ICD-10-CM | POA: Insufficient documentation

## 2014-10-16 DIAGNOSIS — R42 Dizziness and giddiness: Secondary | ICD-10-CM | POA: Insufficient documentation

## 2014-10-16 DIAGNOSIS — Z79899 Other long term (current) drug therapy: Secondary | ICD-10-CM | POA: Insufficient documentation

## 2014-10-16 DIAGNOSIS — Z3202 Encounter for pregnancy test, result negative: Secondary | ICD-10-CM | POA: Insufficient documentation

## 2014-10-16 DIAGNOSIS — I517 Cardiomegaly: Secondary | ICD-10-CM | POA: Insufficient documentation

## 2014-10-16 DIAGNOSIS — Z792 Long term (current) use of antibiotics: Secondary | ICD-10-CM | POA: Insufficient documentation

## 2014-10-16 LAB — BASIC METABOLIC PANEL
Anion gap: 10 (ref 5–15)
BUN: 14 mg/dL (ref 6–23)
CALCIUM: 8.6 mg/dL (ref 8.4–10.5)
CO2: 31 meq/L (ref 19–32)
Chloride: 102 mEq/L (ref 96–112)
Creatinine, Ser: 0.74 mg/dL (ref 0.50–1.10)
GFR calc Af Amer: >90 mL/min (ref >90)
GFR calc non Af Amer: >90 mL/min (ref >90)
GLUCOSE: 99 mg/dL (ref 70–99)
Potassium: 3.4 mEq/L — ABNORMAL LOW (ref 3.7–5.3)
SODIUM: 143 meq/L (ref 137–147)

## 2014-10-16 LAB — CBC
HEMATOCRIT: 39.9 % (ref 36.0–46.0)
HEMOGLOBIN: 13 g/dL (ref 12.0–15.0)
MCH: 29 pg (ref 26.0–34.0)
MCHC: 32.6 g/dL (ref 30.0–36.0)
MCV: 89.1 fL (ref 78.0–100.0)
Platelets: 210 10*3/uL (ref 150–400)
RBC: 4.48 MIL/uL (ref 3.87–5.11)
RDW: 13.5 % (ref 11.5–15.5)
WBC: 16.7 10*3/uL — ABNORMAL HIGH (ref 4.0–10.5)

## 2014-10-16 LAB — URINALYSIS, ROUTINE W REFLEX MICROSCOPIC
Bilirubin Urine: NEGATIVE
GLUCOSE, UA: NEGATIVE mg/dL
KETONES UR: NEGATIVE mg/dL
Leukocytes, UA: NEGATIVE
Nitrite: NEGATIVE
PH: 6 (ref 5.0–8.0)
Protein, ur: NEGATIVE mg/dL
Specific Gravity, Urine: 1.025 (ref 1.005–1.030)
Urobilinogen, UA: 0.2 mg/dL (ref 0.0–1.0)

## 2014-10-16 LAB — URINE MICROSCOPIC-ADD ON

## 2014-10-16 LAB — PREGNANCY, URINE: PREG TEST UR: NEGATIVE

## 2014-10-16 LAB — PRO B NATRIURETIC PEPTIDE: Pro B Natriuretic peptide (BNP): 153 pg/mL — ABNORMAL HIGH (ref 0–125)

## 2014-10-16 LAB — D-DIMER, QUANTITATIVE: D-Dimer, Quant: 0.4 ug/mL-FEU (ref 0.00–0.48)

## 2014-10-16 NOTE — Discharge Instructions (Signed)
You were testing was all normal except for a very tiny amount of fluid on your lungs, your heart appeared to be on the large side, these tests need to be repeated in the next month after your symptoms totally resolved. Please stop taking prednisone, followup with your family doctor in the next 2 days.  Stay out of work for 2 days, drink plenty of fluids.  Please call your doctor for a followup appointment within 24-48 hours. When you talk to your doctor please let them know that you were seen in the emergency department and have them acquire all of your records so that they can discuss the findings with you and formulate a treatment plan to fully care for your new and ongoing problems.

## 2014-10-16 NOTE — ED Provider Notes (Signed)
CSN: [id number]     Arrival date & time 10/16/14  1041 History   First MD Initiated Contact with Patient 10/16/14 1254     Chief Complaint  Patient presents with  . Dizziness     (Consider location/radiation/quality/duration/timing/severity/associated sxs/prior Treatment) HPI Comments: Pt is a 28 y/o female recently seen for respiratory illness, presents with light headedness for several weeks - feels this only when she stands or gets up quickly - never happens when supine or sitting and resting - she has had normal appetite, no n/v/d/ and no dysuria, no f/c and normal vision.  No numbn / weakness.  She has had recent abx use with Z pak and prednisone but has no filled her cough med rx.  Her cough is gradually improving though she feels she is still wehezing.  Sx are intermittent, better with sitting.  On menses at this time.    Patient is a 28 y.o. female presenting with dizziness. The history is provided by the patient and medical records.  Dizziness   Past Medical History  Diagnosis Date  . Hypertension   . Edema   . Degenerative disc disease   . Spondylosis    Past Surgical History  Procedure Laterality Date  . Cholecystectomy     Family History  Problem Relation Age of Onset  . Cancer Other   . Hypertension Other   . Thyroid disease Other   . Hyperlipidemia Other   . Diabetes    . Lung disease    . Arthritis     History  Substance Use Topics  . Smoking status: Current Every Day Smoker -- 1.00 packs/day for 4 years    Types: Cigarettes  . Smokeless tobacco: Never Used  . Alcohol Use: No   OB History   Grav Para Term Preterm Abortions TAB SAB Ect Mult Living            0     Review of Systems  Neurological: Positive for dizziness.  All other systems reviewed and are negative.     Allergies  Review of patient's allergies indicates no known allergies.  Home Medications   Prior to Admission medications   Medication Sig Start Date End Date Taking?  Authorizing Provider  azithromycin (ZITHROMAX) 250 MG tablet Take 1 tablet (250 mg total) by mouth daily. Take first 2 tablets together, then 1 every day until finished. 10/12/14  Yes Tammy L. Triplett, PA-C  guaiFENesin-codeine (ROBITUSSIN AC) 100-10 MG/5ML syrup Take 10 mLs by mouth 3 (three) times daily as needed. 10/12/14  Yes Tammy L. Triplett, PA-C  ibuprofen (ADVIL,MOTRIN) 200 MG tablet Take 800 mg by mouth every 6 (six) hours as needed (pain).    Yes Historical Provider, MD  predniSONE (DELTASONE) 10 MG tablet Take 6 tablets day one, 5 tablets day two, 4 tablets day three, 3 tablets day four, 2 tablets day five, then 1 tablet day six 10/12/14  Yes Tammy L. Triplett, PA-C  potassium chloride (K-DUR) 10 MEQ tablet Take 2 tablets (20 mEq total) by mouth daily. 08/18/12 08/18/13  Delfina Redwood, MD   BP 115/70  Pulse 84  Temp(Src) 98 F (36.7 C) (Oral)  Resp 20  Ht 5' 2"  (1.575 m)  Wt 305 lb (138.347 kg)  BMI 55.77 kg/m2  SpO2 90%  LMP 10/14/2014 Physical Exam  Nursing note and vitals reviewed. Constitutional: She appears well-developed and well-nourished. No distress.  HENT:  Head: Normocephalic and atraumatic.  Mouth/Throat: Oropharynx is clear and moist. No oropharyngeal exudate.  Eyes: Conjunctivae and EOM are normal. Pupils are equal, round, and reactive to light. Right eye exhibits no discharge. Left eye exhibits no discharge. No scleral icterus.  Neck: Normal range of motion. Neck supple. No JVD present. No thyromegaly present.  Cardiovascular: Normal rate, regular rhythm, normal heart sounds and intact distal pulses.  Exam reveals no gallop and no friction rub.   No murmur heard. Pulmonary/Chest: Effort normal. No respiratory distress. She has wheezes. She has no rales.  No distress, speaks in full sentences, mild exp wheezing in all lung fields, ronchi scattered.  Abdominal: Soft. Bowel sounds are normal. She exhibits no distension and no mass. There is no tenderness.   Soft, NT and morbidly obese.  Musculoskeletal: Normal range of motion. She exhibits no edema and no tenderness.  Lymphadenopathy:    She has no cervical adenopathy.  Neurological: She is alert. Coordination normal.  Skin: Skin is warm and dry. No rash noted. No erythema.  Psychiatric: She has a normal mood and affect. Her behavior is normal.    ED Course  Procedures (including critical care time) Labs Review Labs Reviewed  CBC - Abnormal; Notable for the following:    WBC 16.7 (*)    All other components within normal limits  BASIC METABOLIC PANEL - Abnormal; Notable for the following:    Potassium 3.4 (*)    All other components within normal limits  URINALYSIS, ROUTINE W REFLEX MICROSCOPIC - Abnormal; Notable for the following:    Hgb urine dipstick MODERATE (*)    All other components within normal limits  URINE MICROSCOPIC-ADD ON - Abnormal; Notable for the following:    Squamous Epithelial / LPF FEW (*)    All other components within normal limits  PRO B NATRIURETIC PEPTIDE - Abnormal; Notable for the following:    Pro B Natriuretic peptide (BNP) 153.0 (*)    All other components within normal limits  PREGNANCY, URINE  D-DIMER, QUANTITATIVE    Imaging Review Dg Chest 2 View  10/16/2014   CLINICAL DATA:  Subsequent encounter for 2-3 week history of dizziness and palpitations. New onset chest pain last night.  EXAM: CHEST  2 VIEW  COMPARISON:  10/12/2014.  09/03/08  FINDINGS: The lungs are clear without focal infiltrate, edema, pneumothorax or pleural effusion. There is pulmonary vascular congestion without overt pulmonary edema. Prominence of the right atrium is stable since 2009. Imaged bony structures of the thorax are intact. Apparent inferior subluxation of the left humeral head is probably projectional. Telemetry leads overlie the chest.  IMPRESSION: Cardiomegaly with vascular congestion. No acute cardiopulmonary findings.   Electronically Signed   By: Misty Stanley M.D.    On: 10/16/2014 14:02     EKG Interpretation   Date/Time:  Monday October 16 2014 11:26:30 EDT Ventricular Rate:  91 PR Interval:  141 QRS Duration: 115 QT Interval:  373 QTC Calculation: 459 R Axis:   99 Text Interpretation:  Sinus rhythm Incomplete right bundle branch block  Borderline ECG Since last tracing QRS has widened slightly, no other  changes Confirmed by Oneta Sigman  MD, Irvine Glorioso ([id number]) on 10/16/2014 12:55:53 PM      MDM   Final diagnoses:  Light headedness  Cardiomegaly    Pt is in no distress, she has some orthostatic change with standing, leukocytosis may be related to prednisones.  Labs, orthostatics, d dimer.  Labs are unremarkable, chest x-ray shows mild cardiomegaly and pulmonary venous congestion, this was discussed with the patient, her BNP is normal, no change  in orthostatic vital signs, patient appears stable for discharge and has been informed of her findings. She will followup as outpatient.  Johnna Acosta, MD 10/16/14 [phone]

## 2014-10-16 NOTE — ED Notes (Signed)
Patient complaining of dizziness x 2-3 weeks. Patient also complaining of palpitations x 3 weeks ago and chest pain starting last night.

## 2014-12-10 ENCOUNTER — Emergency Department (HOSPITAL_COMMUNITY)
Admission: EM | Admit: 2014-12-10 | Discharge: 2014-12-11 | Disposition: A | Discharge status: Home / Self Care | Payer: Self-pay | Attending: Emergency Medicine | Admitting: Emergency Medicine

## 2014-12-10 ENCOUNTER — Encounter (HOSPITAL_COMMUNITY): Payer: Self-pay | Admitting: *Deleted

## 2014-12-10 DIAGNOSIS — Z9049 Acquired absence of other specified parts of digestive tract: Secondary | ICD-10-CM | POA: Insufficient documentation

## 2014-12-10 DIAGNOSIS — Z792 Long term (current) use of antibiotics: Secondary | ICD-10-CM | POA: Insufficient documentation

## 2014-12-10 DIAGNOSIS — Z3202 Encounter for pregnancy test, result negative: Secondary | ICD-10-CM | POA: Insufficient documentation

## 2014-12-10 DIAGNOSIS — N760 Acute vaginitis: Secondary | ICD-10-CM

## 2014-12-10 DIAGNOSIS — Z7952 Long term (current) use of systemic steroids: Secondary | ICD-10-CM | POA: Insufficient documentation

## 2014-12-10 DIAGNOSIS — Z8739 Personal history of other diseases of the musculoskeletal system and connective tissue: Secondary | ICD-10-CM | POA: Insufficient documentation

## 2014-12-10 DIAGNOSIS — Z720 Tobacco use: Secondary | ICD-10-CM | POA: Insufficient documentation

## 2014-12-10 DIAGNOSIS — R609 Edema, unspecified: Secondary | ICD-10-CM | POA: Insufficient documentation

## 2014-12-10 DIAGNOSIS — B9689 Other specified bacterial agents as the cause of diseases classified elsewhere: Secondary | ICD-10-CM

## 2014-12-10 DIAGNOSIS — Z79899 Other long term (current) drug therapy: Secondary | ICD-10-CM | POA: Insufficient documentation

## 2014-12-10 DIAGNOSIS — I10 Essential (primary) hypertension: Secondary | ICD-10-CM | POA: Insufficient documentation

## 2014-12-10 DIAGNOSIS — N938 Other specified abnormal uterine and vaginal bleeding: Secondary | ICD-10-CM

## 2014-12-10 NOTE — ED Notes (Addendum)
Pt c/o light vaginal bleeding x 3-4 weeks. Pt states today it got heavier. Periods have been abnormal. Pt states she has a "gooey, pink tissue stuff" coming out of her vagina. Pt also c/o right sided hip pain that radiates down right leg.

## 2014-12-10 NOTE — ED Provider Notes (Signed)
CSN: [id number]     Arrival date & time 12/10/14  2257 History  This chart was scribed for Delora Fuel, MD by Lowella Petties, ED Scribe. The patient was seen in room APA03/AP[facility]03. Patient's care was started at 11:47 PM.     Chief Complaint  Patient presents with  . Vaginal Bleeding   The history is provided by the patient. No language interpreter was used.   HPI Comments: Heidi Landry is a 28 y.o. female who presents to the Emergency Department complaining of intermittent, light, vaginal bleeding lasting 2-3 days per episode, for the past 3-4 weeks. She had a significantly heavier episode earlier tonight which prompted her to come into the ED. She reports that the blood has been light brown. She reports associated nausea, mild abdominal pain, and menstrual cramping today which she rates as a 5-6/10. She states that she has not been taking Birth Control for the past 6 months. She denies vomiting. She denies any history of pregnancy, miscarriages, or abortions. She reports a history of genital warts. She reports taking negative pregnancy tests at home. She smokes 1 PPD.   PCP: health clinic   Past Medical History  Diagnosis Date  . Hypertension   . Edema   . Degenerative disc disease   . Spondylosis    Past Surgical History  Procedure Laterality Date  . Cholecystectomy     Family History  Problem Relation Age of Onset  . Cancer Other   . Hypertension Other   . Thyroid disease Other   . Hyperlipidemia Other   . Diabetes    . Lung disease    . Arthritis     History  Substance Use Topics  . Smoking status: Current Every Day Smoker -- 1.00 packs/day for 4 years    Types: Cigarettes  . Smokeless tobacco: Never Used  . Alcohol Use: No   OB History    Gravida Para Term Preterm AB TAB SAB Ectopic Multiple Living            0     Review of Systems  Gastrointestinal: Positive for nausea and abdominal pain (mild).  Genitourinary: Positive for vaginal bleeding.  All other  systems reviewed and are negative.  Allergies  Review of patient's allergies indicates no known allergies.  Home Medications   Prior to Admission medications   Medication Sig Start Date End Date Taking? Authorizing Provider  azithromycin (ZITHROMAX) 250 MG tablet Take 1 tablet (250 mg total) by mouth daily. Take first 2 tablets together, then 1 every day until finished. 10/12/14   Tammy L. Triplett, PA-C  guaiFENesin-codeine (ROBITUSSIN AC) 100-10 MG/5ML syrup Take 10 mLs by mouth 3 (three) times daily as needed. 10/12/14   Tammy L. Triplett, PA-C  ibuprofen (ADVIL,MOTRIN) 200 MG tablet Take 800 mg by mouth every 6 (six) hours as needed (pain).     Historical Provider, MD  potassium chloride (K-DUR) 10 MEQ tablet Take 2 tablets (20 mEq total) by mouth daily. 08/18/12 08/18/13  Delfina Redwood, MD  predniSONE (DELTASONE) 10 MG tablet Take 6 tablets day one, 5 tablets day two, 4 tablets day three, 3 tablets day four, 2 tablets day five, then 1 tablet day six 10/12/14   Tammy L. Triplett, PA-C   Triage Vitals: BP 123/46 mmHg  Pulse 87  Temp(Src) 98.7 F (37.1 C) (Oral)  Resp 16  Ht 5' 2"  (1.575 m)  Wt 314 lb 8 oz (142.656 kg)  BMI 57.51 kg/m2  SpO2 100% Physical  Exam  Constitutional: She is oriented to person, place, and time. She appears well-developed and well-nourished. No distress.  Morbidly obese.   HENT:  Head: Normocephalic and atraumatic.  Eyes: Conjunctivae and EOM are normal. Pupils are equal, round, and reactive to light.  Neck: Normal range of motion. Neck supple. No JVD present. No tracheal deviation present.  Cardiovascular: Normal rate, regular rhythm and normal heart sounds.   No murmur heard. Pulmonary/Chest: Effort normal and breath sounds normal. No respiratory distress. She has no wheezes. She has no rales.  Abdominal: Soft. Bowel sounds are normal. She exhibits no mass. There is tenderness. There is no rebound and no guarding.  Mild suprapubic tenderness.   Genitourinary:  Normal external female genitalia. Small amount of blood present in the vaginal vault but cervix is closed. No adnexal masses or tenderness. Fundal size is difficult to appreciate because of body habitus.  Musculoskeletal: Normal range of motion. She exhibits edema.  Trace edema.  Lymphadenopathy:    She has no cervical adenopathy.  Neurological: She is alert and oriented to person, place, and time. She has normal reflexes. No cranial nerve deficit. Coordination normal.  Skin: Skin is warm and dry. No rash noted.  Psychiatric: She has a normal mood and affect. Her behavior is normal. Thought content normal.  Nursing note and vitals reviewed.   ED Course  Procedures (including critical care time) DIAGNOSTIC STUDIES: Oxygen Saturation is 100% on room air, normal by my interpretation.    COORDINATION OF CARE: 11:54 PM-Discussed treatment plan which includes UA, pelvic exam, and pregnancy test with pt at bedside and pt agreed to plan.   Labs Review Results for orders placed or performed during the hospital encounter of 12/10/14  Wet prep, genital  Result Value Ref Range   Yeast Wet Prep HPF POC NONE SEEN NONE SEEN   Trich, Wet Prep NONE SEEN NONE SEEN   Clue Cells Wet Prep HPF POC MANY (A) NONE SEEN   WBC, Wet Prep HPF POC MANY (A) NONE SEEN  Pregnancy, urine  Result Value Ref Range   Preg Test, Ur NEGATIVE NEGATIVE  Urinalysis, Routine w reflex microscopic  Result Value Ref Range   Color, Urine YELLOW YELLOW   APPearance CLEAR CLEAR   Specific Gravity, Urine >1.030 (H) 1.005 - 1.030   pH 5.5 5.0 - 8.0   Glucose, UA NEGATIVE NEGATIVE mg/dL   Hgb urine dipstick LARGE (A) NEGATIVE   Bilirubin Urine NEGATIVE NEGATIVE   Ketones, ur NEGATIVE NEGATIVE mg/dL   Protein, ur NEGATIVE NEGATIVE mg/dL   Urobilinogen, UA 0.2 0.0 - 1.0 mg/dL   Nitrite NEGATIVE NEGATIVE   Leukocytes, UA NEGATIVE NEGATIVE  Urine microscopic-add on  Result Value Ref Range   Squamous  Epithelial / LPF RARE RARE   WBC, UA 0-2 <3 WBC/hpf   RBC / HPF 0-2 <3 RBC/hpf   MDM   Final diagnoses:  Dysfunctional uterine bleeding  BV (bacterial vaginosis)    Abnormal bleeding. Pregnancy test is obtained and is negative. Wet prep is significant for large number of clue cells. I doubt this is related to her bleeding but probably should be treated. She is discharged with prescriptions for medroxyprogesterone for 5 days and metronidazole for 7 days. She is referred back to gynecology for follow-up.   I personally performed the services described in this documentation, which was scribed in my presence. The recorded information has been reviewed and is accurate.      Delora Fuel, MD 93/57/01 [id number]

## 2014-12-11 LAB — URINALYSIS, ROUTINE W REFLEX MICROSCOPIC
Bilirubin Urine: NEGATIVE
GLUCOSE, UA: NEGATIVE mg/dL
Ketones, ur: NEGATIVE mg/dL
Leukocytes, UA: NEGATIVE
Nitrite: NEGATIVE
PROTEIN: NEGATIVE mg/dL
Specific Gravity, Urine: >1.03 — ABNORMAL HIGH (ref 1.005–1.030)
Urobilinogen, UA: 0.2 mg/dL (ref 0.0–1.0)
pH: 5.5 (ref 5.0–8.0)

## 2014-12-11 LAB — HIV ANTIBODY (ROUTINE TESTING W REFLEX): HIV: NONREACTIVE

## 2014-12-11 LAB — WET PREP, GENITAL
Trich, Wet Prep: NONE SEEN
Yeast Wet Prep HPF POC: NONE SEEN

## 2014-12-11 LAB — URINE MICROSCOPIC-ADD ON

## 2014-12-11 LAB — RPR

## 2014-12-11 LAB — PREGNANCY, URINE: Preg Test, Ur: NEGATIVE

## 2014-12-11 MED ORDER — MEDROXYPROGESTERONE ACETATE 5 MG PO TABS: 5.0000 mg | ORAL_TABLET | Freq: Every day | ORAL | Status: DC

## 2014-12-11 MED ORDER — METRONIDAZOLE 500 MG PO TABS: 500.0000 mg | ORAL_TABLET | Freq: Three times a day (TID) | ORAL | Status: DC

## 2014-12-11 NOTE — Discharge Instructions (Signed)
Abnormal Uterine Bleeding Abnormal uterine bleeding can affect women at various stages in life, including teenagers, women in their reproductive years, pregnant women, and women who have reached menopause. Several kinds of uterine bleeding are considered abnormal, including:  Bleeding or spotting between periods.   Bleeding after sexual intercourse.   Bleeding that is heavier or more than normal.   Periods that last longer than usual.  Bleeding after menopause.  Many cases of abnormal uterine bleeding are minor and simple to treat, while others are more serious. Any type of abnormal bleeding should be evaluated by your health care provider. Treatment will depend on the cause of the bleeding. HOME CARE INSTRUCTIONS Monitor your condition for any changes. The following actions may help to alleviate any discomfort you are experiencing:  Avoid the use of tampons and douches as directed by your health care provider.  Change your pads frequently. You should get regular pelvic exams and Pap tests. Keep all follow-up appointments for diagnostic tests as directed by your health care provider.  SEEK MEDICAL CARE IF:   Your bleeding lasts more than 1 week.   You feel dizzy at times.  SEEK IMMEDIATE MEDICAL CARE IF:   You pass out.   You are changing pads every 15 to 30 minutes.   You have abdominal pain.  You have a fever.   You become sweaty or weak.   You are passing large blood clots from the vagina.   You start to feel nauseous and vomit. MAKE SURE YOU:   Understand these instructions.  Will watch your condition.  Will get help right away if you are not doing well or get worse. Document Released: 12/15/2005 Document Revised: 12/20/2013 Document Reviewed: 07/14/2013 [facility] Patient Information 2015 [place], [place]. This information is not intended to replace advice given to you by your health care provider. Make sure you discuss any questions you have with your  health care provider.  Bacterial Vaginosis Bacterial vaginosis is a vaginal infection that occurs when the normal balance of bacteria in the vagina is disrupted. It results from an overgrowth of certain bacteria. This is the most common vaginal infection in women of childbearing age. Treatment is important to prevent complications, especially in pregnant women, as it can cause a premature delivery. CAUSES  Bacterial vaginosis is caused by an increase in harmful bacteria that are normally present in smaller amounts in the vagina. Several different kinds of bacteria can cause bacterial vaginosis. However, the reason that the condition develops is not fully understood. RISK FACTORS Certain activities or behaviors can put you at an increased risk of developing bacterial vaginosis, including:  Having a new sex partner or multiple sex partners.  Douching.  Using an intrauterine device (IUD) for contraception. Women do not get bacterial vaginosis from toilet seats, bedding, swimming pools, or contact with objects around them. SIGNS AND SYMPTOMS  Some women with bacterial vaginosis have no signs or symptoms. Common symptoms include:  Grey vaginal discharge.  A fishlike odor with discharge, especially after sexual intercourse.  Itching or burning of the vagina and vulva.  Burning or pain with urination. DIAGNOSIS  Your health care provider will take a medical history and examine the vagina for signs of bacterial vaginosis. A sample of vaginal fluid may be taken. Your health care provider will look at this sample under a microscope to check for bacteria and abnormal cells. A vaginal pH test may also be done.  TREATMENT  Bacterial vaginosis may be treated with antibiotic medicines. These  may be given in the form of a pill or a vaginal cream. A second round of antibiotics may be prescribed if the condition comes back after treatment.  HOME CARE INSTRUCTIONS   Only take over-the-counter or  prescription medicines as directed by your health care provider.  If antibiotic medicine was prescribed, take it as directed. Make sure you finish it even if you start to feel better.  Do not have sex until treatment is completed.  Tell all sexual partners that you have a vaginal infection. They should see their health care provider and be treated if they have problems, such as a mild rash or itching.  Practice safe sex by using condoms and only having one sex partner. SEEK MEDICAL CARE IF:   Your symptoms are not improving after 3 days of treatment.  You have increased discharge or pain.  You have a fever. MAKE SURE YOU:   Understand these instructions.  Will watch your condition.  Will get help right away if you are not doing well or get worse. FOR MORE INFORMATION  Centers for Disease Control and Prevention, Division of STD Prevention: [employer] American Sexual Health Association (ASHA): www.ashastd.org  Document Released: 12/15/2005 Document Revised: 10/05/2013 Document Reviewed: 07/27/2013 [facility] Patient Information 2015 [place], [place]. This information is not intended to replace advice given to you by your health care provider. Make sure you discuss any questions you have with your health care provider.  Medroxyprogesterone tablets What is this medicine? MEDROXYPROGESTERONE (me DROX ee proe JES te rone) is a hormone in a class called progestins. It is commonly used to prevent the uterine lining from overgrowth in women taking an estrogen after menopause. It is also used to treat irregular menstrual bleeding or a lack of menstrual bleeding in women. This medicine may be used for other purposes; ask your health care provider or pharmacist if you have questions. COMMON BRAND NAME(S): Amen, Provera What should I tell my health care provider before I take this medicine? They need to know if you have any of these conditions: -blood vessel disease or a history of a blood  clot in the lungs or legs -breast, cervical or vaginal cancer -heart disease -kidney disease -liver disease -migraine -recent miscarriage or abortion -mental depression -migraine -seizures (convulsions) -stroke -vaginal bleeding that has not been evaluated -an unusual or allergic reaction to medroxyprogesterone, other medicines, foods, dyes, or preservatives -pregnant or trying to get pregnant -breast-feeding How should I use this medicine? Take this medicine by mouth with a glass of water. Follow the directions on the prescription label. Take your doses at regular intervals. Do not take your medicine more often than directed. Talk to your pediatrician regarding the use of this medicine in children. Special care may be needed. While this drug may be prescribed for children as young as 13 years for selected conditions, precautions do apply. Overdosage: If you think you have taken too much of this medicine contact a poison control center or emergency room at once. NOTE: This medicine is only for you. Do not share this medicine with others. What if I miss a dose? If you miss a dose, take it as soon as you can. If it is almost time for your next dose, take only that dose. Do not take double or extra doses. What may interact with this medicine? -barbiturate medicines for inducing sleep or treating seizures (convulsions) -bosentan -carbamazepine -phenytoin -rifampin -St. John's Wort This list may not describe all possible interactions. Give your health care provider  a list of all the medicines, herbs, non-prescription drugs, or dietary supplements you use. Also tell them if you smoke, drink alcohol, or use illegal drugs. Some items may interact with your medicine. What should I watch for while using this medicine? Visit your health care professional for regular checks on your progress. You will need a regular breast and pelvic exam. If you have any reason to think you are pregnant, stop  taking this medicine at once and contact your doctor or health care professional. What side effects may I notice from receiving this medicine? Side effects that you should report to your doctor or health care professional as soon as possible: -breast tenderness or discharge -changes in mood or emotions, such as depression -changes in vision or speech -pain in the abdomen, chest, groin, or leg -severe headache -skin rash, itching, or hives -sudden shortness of breath -unusually weak or tired -yellowing of skin or eyes Side effects that usually do not require medical attention (report to your doctor or health care professional if they continue or are bothersome): -acne -change in menstrual bleeding pattern or flow -changes in sexual desire -facial hair growth -fluid retention and swelling -headache -upset stomach -weight gain or loss This list may not describe all possible side effects. Call your doctor for medical advice about side effects. You may report side effects to FDA at 1-800-FDA-1088. Where should I keep my medicine? Keep out of the reach of children. Store at room temperature between 20 and 25 degrees C (68 and 77 degrees F). Throw away any unused medicine after the expiration date. NOTE: This sheet is a summary. It may not cover all possible information. If you have questions about this medicine, talk to your doctor, pharmacist, or health care provider.  2015, Elsevier/Gold Standard. (2008-12-14 11:26:12)  Metronidazole tablets or capsules What is this medicine? METRONIDAZOLE (me troe NI da zole) is an antiinfective. It is used to treat certain kinds of bacterial and protozoal infections. It will not work for colds, flu, or other viral infections. This medicine may be used for other purposes; ask your health care provider or pharmacist if you have questions. COMMON BRAND NAME(S): Flagyl What should I tell my health care provider before I take this medicine? They need to  know if you have any of these conditions: -anemia or other blood disorders -disease of the nervous system -fungal or yeast infection -if you drink alcohol containing drinks -liver disease -seizures -an unusual or allergic reaction to metronidazole, or other medicines, foods, dyes, or preservatives -pregnant or trying to get pregnant -breast-feeding How should I use this medicine? Take this medicine by mouth with a full glass of water. Follow the directions on the prescription label. Take your medicine at regular intervals. Do not take your medicine more often than directed. Take all of your medicine as directed even if you think you are better. Do not skip doses or stop your medicine early. Talk to your pediatrician regarding the use of this medicine in children. Special care may be needed. Overdosage: If you think you have taken too much of this medicine contact a poison control center or emergency room at once. NOTE: This medicine is only for you. Do not share this medicine with others. What if I miss a dose? If you miss a dose, take it as soon as you can. If it is almost time for your next dose, take only that dose. Do not take double or extra doses. What may interact with this medicine? Do  not take this medicine with any of the following medications: -alcohol or any product that contains alcohol -amprenavir oral solution -cisapride -disulfiram -dofetilide -dronedarone -paclitaxel injection -pimozide -ritonavir oral solution -sertraline oral solution -sulfamethoxazole-trimethoprim injection -thioridazine -ziprasidone This medicine may also interact with the following medications: -birth control pills -cimetidine -lithium -other medicines that prolong the QT interval (cause an abnormal heart rhythm) -phenobarbital -phenytoin -warfarin This list may not describe all possible interactions. Give your health care provider a list of all the medicines, herbs, non-prescription  drugs, or dietary supplements you use. Also tell them if you smoke, drink alcohol, or use illegal drugs. Some items may interact with your medicine. What should I watch for while using this medicine? Tell your doctor or health care professional if your symptoms do not improve or if they get worse. You may get drowsy or dizzy. Do not drive, use machinery, or do anything that needs mental alertness until you know how this medicine affects you. Do not stand or sit up quickly, especially if you are an older patient. This reduces the risk of dizzy or fainting spells. Avoid alcoholic drinks while you are taking this medicine and for three days afterward. Alcohol may make you feel dizzy, sick, or flushed. If you are being treated for a sexually transmitted disease, avoid sexual contact until you have finished your treatment. Your sexual partner may also need treatment. What side effects may I notice from receiving this medicine? Side effects that you should report to your doctor or health care professional as soon as possible: -allergic reactions like skin rash or hives, swelling of the face, lips, or tongue -confusion, clumsiness -difficulty speaking -discolored or sore mouth -dizziness -fever, infection -numbness, tingling, pain or weakness in the hands or feet -trouble passing urine or change in the amount of urine -redness, blistering, peeling or loosening of the skin, including inside the mouth -seizures -unusually weak or tired -vaginal irritation, dryness, or discharge Side effects that usually do not require medical attention (report to your doctor or health care professional if they continue or are bothersome): -diarrhea -headache -irritability -metallic taste -nausea -stomach pain or cramps -trouble sleeping This list may not describe all possible side effects. Call your doctor for medical advice about side effects. You may report side effects to FDA at 1-800-FDA-1088. Where should I  keep my medicine? Keep out of the reach of children. Store at room temperature below 25 degrees C (77 degrees F). Protect from light. Keep container tightly closed. Throw away any unused medicine after the expiration date. NOTE: This sheet is a summary. It may not cover all possible information. If you have questions about this medicine, talk to your doctor, pharmacist, or health care provider.  2015, Elsevier/Gold Standard. (2013-07-22 14:08:39)

## 2014-12-12 LAB — GC/CHLAMYDIA PROBE AMP
CT PROBE, AMP APTIMA: NEGATIVE
GC Probe RNA: NEGATIVE

## 2014-12-13 ENCOUNTER — Telehealth (HOSPITAL_COMMUNITY): Payer: Self-pay

## 2014-12-20 ENCOUNTER — Ambulatory Visit: Payer: Self-pay | Admitting: Obstetrics and Gynecology

## 2014-12-25 ENCOUNTER — Encounter: Payer: Self-pay | Admitting: Obstetrics and Gynecology

## 2014-12-25 ENCOUNTER — Ambulatory Visit (INDEPENDENT_AMBULATORY_CARE_PROVIDER_SITE_OTHER): Payer: Self-pay | Admitting: Obstetrics and Gynecology

## 2014-12-25 VITALS — BP 138/76 | Ht 61.0 in | Wt 311.6 lb

## 2014-12-25 DIAGNOSIS — A499 Bacterial infection, unspecified: Secondary | ICD-10-CM

## 2014-12-25 DIAGNOSIS — N760 Acute vaginitis: Secondary | ICD-10-CM

## 2014-12-25 DIAGNOSIS — Z6841 Body Mass Index (BMI) 40.0 and over, adult: Secondary | ICD-10-CM

## 2014-12-25 DIAGNOSIS — N912 Amenorrhea, unspecified: Secondary | ICD-10-CM

## 2014-12-25 DIAGNOSIS — B9689 Other specified bacterial agents as the cause of diseases classified elsewhere: Secondary | ICD-10-CM | POA: Insufficient documentation

## 2014-12-25 MED ORDER — METRONIDAZOLE 500 MG PO TABS: 500.0000 mg | ORAL_TABLET | Freq: Two times a day (BID) | ORAL | Status: DC

## 2014-12-25 NOTE — Progress Notes (Signed)
Patient ID: [name], female   DOB: [birth year], 28 y.o.   MRN: [id number]   [facility] Visit  Patient name: Heidi Landry MRN [id number]  Date of birth: 27-Jun-1986  CC & HPI:  Heidi Landry is a 28 y.o. female presenting today for irreg menses. X last month, no menses this month. Seen in ED placed on provera.  BC off x 6 months.   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  Pertinent History Reviewed:   Reviewed: Significant for morbid obesity weight loss x 80 l  wiith return of wt. Medical         Past Medical History  Diagnosis Date  . Hypertension   . Edema   . Degenerative disc disease   . Spondylosis                               Surgical Hx:    Past Surgical History  Procedure Laterality Date  . Cholecystectomy     Medications: Reviewed & Updated - see associated section                      Current outpatient prescriptions: cetirizine (ZYRTEC) 10 MG tablet, Take 10 mg by mouth as needed for allergies., Disp: , Rfl: ;  ibuprofen (ADVIL,MOTRIN) 200 MG tablet, Take 800 mg by mouth every 6 (six) hours as needed (pain). , Disp: , Rfl:  azithromycin (ZITHROMAX) 250 MG tablet, Take 1 tablet (250 mg total) by mouth daily. Take first 2 tablets together, then 1 every day until finished. (Patient not taking: Reported on 12/25/2014), Disp: 6 tablet, Rfl: 0;  guaiFENesin-codeine (ROBITUSSIN AC) 100-10 MG/5ML syrup, Take 10 mLs by mouth 3 (three) times daily as needed. (Patient not taking: Reported on 12/25/2014), Disp: 120 mL, Rfl: 0 medroxyPROGESTERone (PROVERA) 5 MG tablet, Take 1 tablet (5 mg total) by mouth daily. (Patient not taking: Reported on 12/25/2014), Disp: 5 tablet, Rfl: 0;  metroNIDAZOLE (FLAGYL) 500 MG tablet, Take 1 tablet (500 mg total) by mouth 3 (three) times daily. (Patient not taking: Reported on 12/25/2014), Disp: 30 tablet, Rfl: 0;  potassium chloride (K-DUR) 10 MEQ tablet, Take 2 tablets (20 mEq total) by mouth daily., Disp: 60  tablet, Rfl: 0 predniSONE (DELTASONE) 10 MG tablet, Take 6 tablets day one, 5 tablets day two, 4 tablets day three, 3 tablets day four, 2 tablets day five, then 1 tablet day six (Patient not taking: Reported on 12/25/2014), Disp: 21 tablet, Rfl: 0   Social History: Reviewed -  reports that she has been smoking Cigarettes.  She has a 4 pack-year smoking history. She has never used smokeless tobacco.  Objective Findings:  Vitals: Blood pressure 138/76, height 5' 1"  (1.549 m), weight 141.341 kg (311 lb 9.6 oz), last menstrual period 11/19/2014. Body mass index is 58.91 kg/(m^2).   Physical Examination: General appearance - alert, well appearing, and in no distress and overweight Abdomen - soft, nontender, nondistended, no masses or organomegaly Pelvic - normal external genitalia, vulva, vagina, cervix, uterus and adnexa, VULVA: normal appearing vulva with no masses, tenderness or lesions, CERVIX: normal appearing cervix without discharge or lesions, UTERUS: uterus is normal size, shape, consistency and nontender   Assessment & Plan:   A:  1. AUB 2  Anovulation 3. Morbid obesity 4. BV recurrent  P:  1. Weight watchers 2. Pt to consider resuming ocp's  3 refil metronidazole

## 2014-12-27 ENCOUNTER — Telehealth: Payer: Self-pay | Admitting: Obstetrics and Gynecology

## 2014-12-27 NOTE — Telephone Encounter (Signed)
I spoke with Dr. Glo Herring and he advised, stop medication for 24 hours and see [place] tomorrow afternoon.

## 2014-12-27 NOTE — Telephone Encounter (Signed)
Pt states that she has already done the one day monistat treatment. Pt states that flagyl always gives her a yeast infection. Pt was advised that DR.Glo Herring wanted to see the discharge before giving her the medication. Pt verbalized understanding and the call was switched to the front office and an appointment was made.

## 2014-12-28 ENCOUNTER — Ambulatory Visit: Payer: Self-pay | Admitting: Obstetrics and Gynecology

## 2015-01-04 ENCOUNTER — Telehealth: Payer: Self-pay | Admitting: Obstetrics and Gynecology

## 2015-01-21 NOTE — Telephone Encounter (Signed)
Pt called , and asked to reschedule due to a conflict with cesarean section on 1/25

## 2015-01-22 ENCOUNTER — Ambulatory Visit: Payer: Self-pay | Admitting: Obstetrics and Gynecology

## 2015-02-26 ENCOUNTER — Emergency Department (HOSPITAL_COMMUNITY): Payer: Self-pay

## 2015-02-26 ENCOUNTER — Emergency Department (HOSPITAL_COMMUNITY)
Admission: EM | Admit: 2015-02-26 | Discharge: 2015-02-26 | Disposition: A | Discharge status: Home / Self Care | Payer: Self-pay | Attending: Emergency Medicine | Admitting: Emergency Medicine

## 2015-02-26 ENCOUNTER — Encounter (HOSPITAL_COMMUNITY): Payer: Self-pay

## 2015-02-26 DIAGNOSIS — R42 Dizziness and giddiness: Secondary | ICD-10-CM | POA: Insufficient documentation

## 2015-02-26 DIAGNOSIS — Z792 Long term (current) use of antibiotics: Secondary | ICD-10-CM | POA: Insufficient documentation

## 2015-02-26 DIAGNOSIS — Z7952 Long term (current) use of systemic steroids: Secondary | ICD-10-CM | POA: Insufficient documentation

## 2015-02-26 DIAGNOSIS — Z720 Tobacco use: Secondary | ICD-10-CM | POA: Insufficient documentation

## 2015-02-26 DIAGNOSIS — Z8739 Personal history of other diseases of the musculoskeletal system and connective tissue: Secondary | ICD-10-CM | POA: Insufficient documentation

## 2015-02-26 DIAGNOSIS — Z3202 Encounter for pregnancy test, result negative: Secondary | ICD-10-CM | POA: Insufficient documentation

## 2015-02-26 DIAGNOSIS — Z79899 Other long term (current) drug therapy: Secondary | ICD-10-CM | POA: Insufficient documentation

## 2015-02-26 DIAGNOSIS — I10 Essential (primary) hypertension: Secondary | ICD-10-CM | POA: Insufficient documentation

## 2015-02-26 LAB — CBC WITH DIFFERENTIAL/PLATELET
BASOS ABS: 0 10*3/uL (ref 0.0–0.1)
BASOS PCT: 0 % (ref 0–1)
EOS PCT: 2 % (ref 0–5)
Eosinophils Absolute: 0.3 10*3/uL (ref 0.0–0.7)
HCT: 45 % (ref 36.0–46.0)
Hemoglobin: 14.8 g/dL (ref 12.0–15.0)
LYMPHS PCT: 27 % (ref 12–46)
Lymphs Abs: 4.2 10*3/uL — ABNORMAL HIGH (ref 0.7–4.0)
MCH: 29.4 pg (ref 26.0–34.0)
MCHC: 32.9 g/dL (ref 30.0–36.0)
MCV: 89.5 fL (ref 78.0–100.0)
Monocytes Absolute: 0.8 10*3/uL (ref 0.1–1.0)
Monocytes Relative: 5 % (ref 3–12)
NEUTROS ABS: 10.1 10*3/uL — AB (ref 1.7–7.7)
Neutrophils Relative %: 66 % (ref 43–77)
PLATELETS: 215 10*3/uL (ref 150–400)
RBC: 5.03 MIL/uL (ref 3.87–5.11)
RDW: 13.5 % (ref 11.5–15.5)
WBC: 15.4 10*3/uL — AB (ref 4.0–10.5)

## 2015-02-26 LAB — BASIC METABOLIC PANEL
ANION GAP: 5 (ref 5–15)
BUN: 9 mg/dL (ref 6–23)
CALCIUM: 8.8 mg/dL (ref 8.4–10.5)
CHLORIDE: 103 mmol/L (ref 96–112)
CO2: 30 mmol/L (ref 19–32)
CREATININE: 0.68 mg/dL (ref 0.50–1.10)
GFR calc non Af Amer: >90 mL/min (ref >90)
Glucose, Bld: 96 mg/dL (ref 70–99)
Potassium: 4.6 mmol/L (ref 3.5–5.1)
Sodium: 138 mmol/L (ref 135–145)

## 2015-02-26 LAB — URINALYSIS, ROUTINE W REFLEX MICROSCOPIC
BILIRUBIN URINE: NEGATIVE
Glucose, UA: NEGATIVE mg/dL
Hgb urine dipstick: NEGATIVE
KETONES UR: NEGATIVE mg/dL
Leukocytes, UA: NEGATIVE
NITRITE: NEGATIVE
PH: 6 (ref 5.0–8.0)
PROTEIN: NEGATIVE mg/dL
Specific Gravity, Urine: 1.02 (ref 1.005–1.030)
Urobilinogen, UA: 0.2 mg/dL (ref 0.0–1.0)

## 2015-02-26 LAB — PREGNANCY, URINE: Preg Test, Ur: NEGATIVE

## 2015-02-26 MED ORDER — SODIUM CHLORIDE 0.9 % IV BOLUS (SEPSIS)
1000.0000 mL | Freq: Once | INTRAVENOUS | Status: AC
  Administered 2015-02-26: 1000 mL via INTRAVENOUS

## 2015-02-26 NOTE — ED Notes (Signed)
Pt reports has history of htn but came off of her bp medications after she lost weight.  Reports has put her weight back on recently and bp has been high again.  C/O feeling dizzy and heart fluttering.  Pt says has started a low NA diet a few days ago.

## 2015-02-26 NOTE — ED Notes (Signed)
Transported to xray 

## 2015-02-26 NOTE — ED Provider Notes (Signed)
CSN: [id number]     Arrival date & time 02/26/15  1114 History  This chart was scribed for Tanna Furry, MD by Stephania Fragmin, ED Scribe. This patient was seen in room APA10/APA10 and the patient's care was started at 1:25 PM.    Chief Complaint  Patient presents with  . Hypertension   The history is provided by the patient. No language interpreter was used.     HPI Comments: Heidi Landry is a 29 y.o. female who presents to the Emergency Department complaining of a multiple symptoms that she attributes to hypertension, with onset today. She states that, 3 weeks ago, she started getting severe headaches in her frontal and occipital head, palpitations that felt like her heart was pounding fast and hard, and room-spinning dizziness; these symptoms would happen every day for the past 3 weeks. She states that she feels these symptoms now while in the room. She reports that last night, she had a severe headache that was not alleviated by ibuprofen. Sitting alleviates her dizziness, but doesn't affect her palpitations.   She reports in the past that she had similar headaches when she had high blood pressure. Patient used to taken hypertension medication, but she had lost about 100 pounds and was taken off hypertension medication for 5-6 years; however, she recently gained a large amount of it back.  Patient had gone to the health department and had a systolic pressure reading of 140. She was recommended a low sodium diet, which she has been on for 4-5 days because patient was trying to avoid going back on hypertension medication. She also cut back on her soda intake, and notes some decreased fluid intake as a result. However, patient's symptoms had appeared before she changed her diet. Patient was also started on 300 mg neurontin BID for her back pain when she went to the health department.   Patient reports normal menstrual periods, with LMP last week. She denies heavy bleeding or anemia.  She also denies  sinus pressure, tinnitus, hearing changes, or decreased urine output.  She notes her friend had taken her blood sugar at home, which was in the normal range.    Past Medical History  Diagnosis Date  . Hypertension   . Edema   . Degenerative disc disease   . Spondylosis    Past Surgical History  Procedure Laterality Date  . Cholecystectomy     Family History  Problem Relation Age of Onset  . Cancer Other   . Hypertension Other   . Thyroid disease Other   . Hyperlipidemia Other   . Diabetes    . Lung disease    . Arthritis     History  Substance Use Topics  . Smoking status: Current Every Day Smoker -- 1.00 packs/day for 4 years    Types: Cigarettes  . Smokeless tobacco: Never Used  . Alcohol Use: No   OB History    Gravida Para Term Preterm AB TAB SAB Ectopic Multiple Living            0     Review of Systems  Constitutional: Negative for fever, chills, diaphoresis, appetite change and fatigue.  HENT: Negative for mouth sores, sinus pressure, sore throat and trouble swallowing.   Eyes: Negative for visual disturbance.  Respiratory: Negative for cough, chest tightness, shortness of breath and wheezing.   Cardiovascular: Negative for chest pain.  Gastrointestinal: Negative for nausea, vomiting, abdominal pain, diarrhea and abdominal distention.  Endocrine: Negative for polydipsia, polyphagia and polyuria.  Genitourinary: Negative for dysuria, frequency, hematuria, decreased urine volume and menstrual problem.  Musculoskeletal: Negative for gait problem.  Skin: Negative for color change, pallor and rash.  Neurological: Negative for dizziness, syncope, light-headedness and headaches.  Hematological: Does not bruise/bleed easily.  Psychiatric/Behavioral: Negative for behavioral problems and confusion.      Allergies  Review of patient's allergies indicates no known allergies.  Home Medications   Prior to Admission medications   Medication Sig Start Date End  Date Taking? Authorizing Provider  gabapentin (NEURONTIN) 300 MG capsule Take 300 mg by mouth 2 (two) times daily.   Yes Historical Provider, MD  ibuprofen (ADVIL,MOTRIN) 200 MG tablet Take 800 mg by mouth every 6 (six) hours as needed (pain).    Yes Historical Provider, MD  azithromycin (ZITHROMAX) 250 MG tablet Take 1 tablet (250 mg total) by mouth daily. Take first 2 tablets together, then 1 every day until finished. Patient not taking: Reported on 12/25/2014 10/12/14   Tammy L. Triplett, PA-C  guaiFENesin-codeine (ROBITUSSIN AC) 100-10 MG/5ML syrup Take 10 mLs by mouth 3 (three) times daily as needed. Patient not taking: Reported on 12/25/2014 10/12/14   Tammy L. Triplett, PA-C  medroxyPROGESTERone (PROVERA) 5 MG tablet Take 1 tablet (5 mg total) by mouth daily. Patient not taking: Reported on 12/25/2014 69/79/48   Delora Fuel, MD  metroNIDAZOLE (FLAGYL) 500 MG tablet Take 1 tablet (500 mg total) by mouth 2 (two) times daily. Patient not taking: Reported on 02/26/2015 12/25/14   Jonnie Kind, MD  potassium chloride (K-DUR) 10 MEQ tablet Take 2 tablets (20 mEq total) by mouth daily. 08/18/12 08/18/13  Delfina Redwood, MD  predniSONE (DELTASONE) 10 MG tablet Take 6 tablets day one, 5 tablets day two, 4 tablets day three, 3 tablets day four, 2 tablets day five, then 1 tablet day six Patient not taking: Reported on 12/25/2014 10/12/14   Tammy L. Triplett, PA-C   BP 109/51 mmHg  Pulse 88  Temp(Src) 98 F (36.7 C) (Oral)  Resp 24  Ht 5' 2"  (1.575 m)  Wt 314 lb (142.429 kg)  BMI 57.42 kg/m2  SpO2 93%  LMP 02/12/2015 Physical Exam  Constitutional: She is oriented to person, place, and time. She appears well-developed and well-nourished. No distress.  NAD. Morbid obesity.  HENT:  Head: Normocephalic.  Tender in left ear, but with normal appearance.  Eyes: Conjunctivae are normal. Pupils are equal, round, and reactive to light. No scleral icterus.  No nystagmus.   Neck: Normal range of  motion. Neck supple. No thyromegaly present.  Cardiovascular: Normal rate and regular rhythm.  Exam reveals no gallop and no friction rub.   No murmur heard. Pulmonary/Chest: Effort normal and breath sounds normal. No respiratory distress. She has no wheezes. She has no rales.  Heart rate increased 86 to 110 standing. Minimally symptomatic.  Abdominal: Soft. Bowel sounds are normal. She exhibits no distension. There is no tenderness. There is no rebound.  Musculoskeletal: Normal range of motion.  Neurological: She is alert and oriented to person, place, and time.  Skin: Skin is warm and dry. No rash noted.  Psychiatric: She has a normal mood and affect. Her behavior is normal.  Nursing note and vitals reviewed.   ED Course  Procedures (including critical care time)  DIAGNOSTIC STUDIES: Oxygen Saturation is 92% on room air, adequate by my interpretation.    COORDINATION OF CARE: 1:35 PM - Discussed treatment plan with pt at bedside which includes blood tests and CT scan, and  pt agreed to plan.   Labs Review Labs Reviewed  CBC WITH DIFFERENTIAL/PLATELET - Abnormal; Notable for the following:    WBC 15.4 (*)    Neutro Abs 10.1 (*)    Lymphs Abs 4.2 (*)    All other components within normal limits  BASIC METABOLIC PANEL  URINALYSIS, ROUTINE W REFLEX MICROSCOPIC  PREGNANCY, URINE    Imaging Review Dg Chest 2 View  02/26/2015   CLINICAL DATA:  Hypertension  EXAM: CHEST  2 VIEW  COMPARISON:  10/16/2014  FINDINGS: Cardiomediastinal silhouette is stable. No acute infiltrate or pleural effusion. No pulmonary edema. Mild degenerative changes lower thoracic spine.  IMPRESSION: No active cardiopulmonary disease. Mild degenerative changes lower thoracic spine.   Electronically Signed   By: Lahoma Crocker M.D.   On: 02/26/2015 16:16     EKG Interpretation None      MDM   Final diagnoses:  Dizzy    She hydrated here. Essentially asymptomatic. No vertigo. No frank episodes of  hypotension. However, she was low as 147 systolic. I asked her not to limit her salt or fluid intake. Given her cardiology regarding follow-up secondary to her palpitations, and erratic blood pressures. No cardiac megaly on chest x-ray. Leukocytosis of 15,000. However, no left shift, and no fever here.   Tanna Furry, MD 02/26/15 [phone]

## 2015-02-26 NOTE — Discharge Instructions (Signed)
Do not limit your salt intake.  Stay hydrated, 4 L of water per day.  Cardiology follow-up. Call the number above to arrange an outpatient appointment

## 2015-02-28 ENCOUNTER — Other Ambulatory Visit: Payer: Self-pay | Admitting: *Deleted

## 2015-02-28 ENCOUNTER — Encounter: Payer: Self-pay | Admitting: *Deleted

## 2015-02-28 ENCOUNTER — Encounter: Payer: Self-pay | Admitting: Cardiology

## 2015-02-28 ENCOUNTER — Ambulatory Visit (INDEPENDENT_AMBULATORY_CARE_PROVIDER_SITE_OTHER): Payer: Self-pay | Admitting: Cardiology

## 2015-02-28 VITALS — BP 152/81 | HR 89 | Ht 61.0 in | Wt 309.0 lb

## 2015-02-28 DIAGNOSIS — R002 Palpitations: Secondary | ICD-10-CM

## 2015-02-28 NOTE — Progress Notes (Signed)
Clinical Summary [name] is a 29 y.o.female seen today as a new patient for the following medical problems.  1. Palpitations - started approx 2-3 months ago. Feeling of heart racing. Can have some headaches, can feel lightheaded. Can have SOB. - occurs daily, typically approx 3 times a day. Can last up to 2 hours.  - drinks coffee occasionally, no tea, cut out sodas. No Energy drinks, no EtoH.   2. HTN - on low sodium diet, not currently on meds   Past Medical History  Diagnosis Date  . Hypertension   . Edema   . Degenerative disc disease   . Spondylosis      No Known Allergies   Current Outpatient Prescriptions  Medication Sig Dispense Refill  . azithromycin (ZITHROMAX) 250 MG tablet Take 1 tablet (250 mg total) by mouth daily. Take first 2 tablets together, then 1 every day until finished. (Patient not taking: Reported on 12/25/2014) 6 tablet 0  . gabapentin (NEURONTIN) 300 MG capsule Take 300 mg by mouth 2 (two) times daily.    Marland Kitchen guaiFENesin-codeine (ROBITUSSIN AC) 100-10 MG/5ML syrup Take 10 mLs by mouth 3 (three) times daily as needed. (Patient not taking: Reported on 12/25/2014) 120 mL 0  . ibuprofen (ADVIL,MOTRIN) 200 MG tablet Take 800 mg by mouth every 6 (six) hours as needed (pain).     . medroxyPROGESTERone (PROVERA) 5 MG tablet Take 1 tablet (5 mg total) by mouth daily. (Patient not taking: Reported on 12/25/2014) 5 tablet 0  . metroNIDAZOLE (FLAGYL) 500 MG tablet Take 1 tablet (500 mg total) by mouth 2 (two) times daily. (Patient not taking: Reported on 02/26/2015) 14 tablet 5  . potassium chloride (K-DUR) 10 MEQ tablet Take 2 tablets (20 mEq total) by mouth daily. 60 tablet 0  . predniSONE (DELTASONE) 10 MG tablet Take 6 tablets day one, 5 tablets day two, 4 tablets day three, 3 tablets day four, 2 tablets day five, then 1 tablet day six (Patient not taking: Reported on 12/25/2014) 21 tablet 0   No current facility-administered medications for this visit.      Past Surgical History  Procedure Laterality Date  . Cholecystectomy       No Known Allergies    Family History  Problem Relation Age of Onset  . Cancer Other   . Hypertension Other   . Thyroid disease Other   . Hyperlipidemia Other   . Diabetes    . Lung disease    . Arthritis       Social History [name] reports that she has been smoking Cigarettes.  She has a 4 pack-year smoking history. She has never used smokeless tobacco. [name] reports that she does not drink alcohol.   Review of Systems CONSTITUTIONAL: No weight loss, fever, chills, weakness or fatigue.  HEENT: Eyes: No visual loss, blurred vision, double vision or yellow sclerae.No hearing loss, sneezing, congestion, runny nose or sore throat.  SKIN: No rash or itching.  CARDIOVASCULAR: per HPI RESPIRATORY: No shortness of breath, cough or sputum.  GASTROINTESTINAL: No anorexia, nausea, vomiting or diarrhea. No abdominal pain or blood.  GENITOURINARY: No burning on urination, no polyuria NEUROLOGICAL: No headache, dizziness, syncope, paralysis, ataxia, numbness or tingling in the extremities. No change in bowel or bladder control.  MUSCULOSKELETAL: No muscle, back pain, joint pain or stiffness.  LYMPHATICS: No enlarged nodes. No history of splenectomy.  PSYCHIATRIC: No history of depression or anxiety.  ENDOCRINOLOGIC: No reports of sweating, cold or heat  intolerance. No polyuria or polydipsia.  Marland Kitchen   Physical Examination p 89 bp 152/81 p 89 Wt 309 BMI 58 Gen: resting comfortably, no acute distress HEENT: no scleral icterus, pupils equal round and reactive, no palptable cervical adenopathy,  CV: RRR, no m/r/g, no JVD, no carotid bruits Resp: Clear to auscultation bilaterally GI: abdomen is soft, non-tender, non-distended, normal bowel sounds, no hepatosplenomegaly MSK: extremities are warm, no edema.  Skin: warm, no rash Neuro:  no focal deficits Psych: appropriate  affect     Assessment and Plan   1. Palpitations - will obtain 48 hr holter monitor - counseled to cut down on caffeine intake - work note provided for the this week and weekend - add TSH on previous labs in ER   F/u 3 weeks     Arnoldo Lenis, M.D.

## 2015-02-28 NOTE — Patient Instructions (Signed)
Your physician recommends that you schedule a follow-up appointment in: 3 weeks with Dr. Harl Bowie  Your physician has recommended that you wear a holter monitor for 48 hours. Holter monitors are medical devices that record the heart's electrical activity. Doctors most often use these monitors to diagnose arrhythmias. Arrhythmias are problems with the speed or rhythm of the heartbeat. The monitor is a small, portable device. You can wear one while you do your normal daily activities. This is usually used to diagnose what is causing palpitations/syncope (passing out).  Your physician has requested that you regularly monitor and record your blood pressure readings at home. Please use the same machine at the same time of day to check your readings and record them to bring to your follow-up visit.  WE WILL ADD ON TO YOUR RECENT LABS TSH/MAG  WE HAVE GIVEN YOU A WORK EXCUSE LETTER THROUGH THE WEEKEND  Your physician recommends that you continue on your current medications as directed. Please refer to the Current Medication list given to you today.  Thank you for choosing [facility]!!

## 2015-03-01 DIAGNOSIS — R002 Palpitations: Secondary | ICD-10-CM

## 2015-03-05 ENCOUNTER — Telehealth: Payer: Self-pay | Admitting: Cardiology

## 2015-03-05 NOTE — Telephone Encounter (Signed)
Will forward to Dr. Branch. 

## 2015-03-05 NOTE — Telephone Encounter (Signed)
Has she returned he holter, do we have the print out? Can you send the bp's to me   Zandra Abts MD

## 2015-03-05 NOTE — Telephone Encounter (Signed)
[name] comes into today with BP Readings.. She has been out of work since 02/22/15. She needs to know if she can return to work this Wednesday 03/07/15.

## 2015-03-05 NOTE — Telephone Encounter (Signed)
Pt returned monitor today will be available tomorrow with results. Will fax BP log to [facility]

## 2015-03-06 ENCOUNTER — Telehealth: Payer: Self-pay | Admitting: *Deleted

## 2015-03-06 ENCOUNTER — Encounter: Payer: Self-pay | Admitting: Cardiology

## 2015-03-06 NOTE — Telephone Encounter (Signed)
-----   Message from Arnoldo Lenis, MD sent at 03/06/2015 12:42 PM EST ----- She can return Wednesday if she is feeling up to it. Her blood pressures looked good, we will have the monitor results soon as well.  Zandra Abts MD ----- Message -----    From: Massie Maroon, CMA    Sent: 03/06/2015   8:59 AM      To: Arnoldo Lenis, MD  Can pt return to work on Wednesday or after you review monitor results when they are uploaded?  Francess Mullen ----- Message -----    From: Arnoldo Lenis, MD    Sent: 03/05/2015   2:30 PM      To: Jhania Etherington T Talaysia Pinheiro, CMA  Blood pressures look good. Any updates on her heart monitor?   Zandra Abts MD

## 2015-03-06 NOTE — Telephone Encounter (Signed)
Pt made aware. Will return to work tomorrow.

## 2015-03-07 ENCOUNTER — Telehealth: Payer: Self-pay | Admitting: *Deleted

## 2015-03-07 NOTE — Telephone Encounter (Signed)
Await Holter monitor upload

## 2015-03-12 ENCOUNTER — Telehealth: Payer: Self-pay | Admitting: *Deleted

## 2015-03-12 NOTE — Telephone Encounter (Signed)
-----   Message from Arnoldo Lenis, MD sent at 03/12/2015  4:18 PM EDT ----- Thanks, please let her know that her holter results were normal. There were no abnormal rhythms  Zandra Abts MD ----- Message -----    From: Massie Maroon, CMA    Sent: 03/12/2015   2:05 PM      To: Arnoldo Lenis, MD  Faxed Holter results to [facility].  Takeela Peil

## 2015-03-12 NOTE — Telephone Encounter (Signed)
Pt made aware. Will forward to pcp

## 2015-03-23 ENCOUNTER — Ambulatory Visit (INDEPENDENT_AMBULATORY_CARE_PROVIDER_SITE_OTHER): Payer: Self-pay | Admitting: Cardiology

## 2015-03-23 ENCOUNTER — Encounter: Payer: Self-pay | Admitting: Cardiology

## 2015-03-23 VITALS — BP 128/83 | HR 87 | Ht 61.0 in | Wt 319.0 lb

## 2015-03-23 DIAGNOSIS — I10 Essential (primary) hypertension: Secondary | ICD-10-CM

## 2015-03-23 DIAGNOSIS — R002 Palpitations: Secondary | ICD-10-CM

## 2015-03-23 NOTE — Patient Instructions (Signed)
Your physician wants you to follow-up in: 6 months with Dr. Bryna Colander will receive a reminder letter in the mail two months in advance. If you don't receive a letter, please call our office to schedule the follow-up appointment.  Your physician recommends that you continue on your current medications as directed. Please refer to the Current Medication list given to you today.  Your physician recommends that you return for lab work [facility]  Your physician has requested that you regularly monitor and record your blood pressure readings at home [place] [facility]. Please use the same machine at the same time of day to check your readings and record them to bring to your follow-up visit.  Thank you for choosing [facility]!!

## 2015-03-23 NOTE — Progress Notes (Signed)
Clinical Summary [name] is a 29 y.o.female seen today for follow up of the following medical problems.   1. Palpitations - started approx 2-3 months ago.  - 48 hr holter with no symptoms reported and no arrhythmias - started on lopressor 12.77m bid, doing well. Symptoms have resolved.   2. HTN - on low sodium diet, not currently on meds  3. OSA screen - + apneic episodes, + snoring, some daytime somnolence   Past Medical History  Diagnosis Date  . Hypertension   . Edema   . Degenerative disc disease   . Spondylosis      No Known Allergies   Current Outpatient Prescriptions  Medication Sig Dispense Refill  . gabapentin (NEURONTIN) 300 MG capsule Take 300 mg by mouth 2 (two) times daily.    .Marland Kitchenibuprofen (ADVIL,MOTRIN) 200 MG tablet Take 800 mg by mouth every 6 (six) hours as needed (pain).     . metroNIDAZOLE (FLAGYL) 500 MG tablet Take 500 mg by mouth 2 (two) times daily as needed.     No current facility-administered medications for this visit.     Past Surgical History  Procedure Laterality Date  . Cholecystectomy       No Known Allergies    Family History  Problem Relation Age of Onset  . Cancer Other   . Hypertension Other   . Thyroid disease Other   . Hyperlipidemia Other   . Diabetes    . Lung disease    . Arthritis       Social History Ms. Heidi Sillsreports that she has been smoking Cigarettes.  She started smoking about 8 years ago. She has a 9 pack-year smoking history. She has never used smokeless tobacco. Ms. Heidi Sillsreports that she does not drink alcohol.   Review of Systems CONSTITUTIONAL: No weight loss, fever, chills, weakness or fatigue.  HEENT: Eyes: No visual loss, blurred vision, double vision or yellow sclerae.No hearing loss, sneezing, congestion, runny nose or sore throat.  SKIN: No rash or itching.  CARDIOVASCULAR: per HPI RESPIRATORY: No shortness of breath, cough or sputum.  GASTROINTESTINAL: No anorexia,  nausea, vomiting or diarrhea. No abdominal pain or blood.  GENITOURINARY: No burning on urination, no polyuria NEUROLOGICAL: No headache, dizziness, syncope, paralysis, ataxia, numbness or tingling in the extremities. No change in bowel or bladder control.  MUSCULOSKELETAL: No muscle, back pain, joint pain or stiffness.  LYMPHATICS: No enlarged nodes. No history of splenectomy.  PSYCHIATRIC: No history of depression or anxiety.  ENDOCRINOLOGIC: No reports of sweating, cold or heat intolerance. No polyuria or polydipsia.  .Marland Kitchen  Physical Examination p 87 bp 128/83 Wt 319 lbs BMI 60 Gen: resting comfortably, no acute distress HEENT: no scleral icterus, pupils equal round and reactive, no palptable cervical adenopathy,  CV: RRR, no m/r/g, no JVD, no carotid bruits Resp: Clear to auscultation bilaterally GI: abdomen is soft, non-tender, non-distended, normal bowel sounds, no hepatosplenomegaly MSK: extremities are warm, no edema.  Skin: warm, no rash Neuro:  no focal deficits Psych: appropriate affect    Assessment and Plan  1. Palpitations - no significant arrhythmias on holter - symptoms improved on low dose lopressor - will check TSH. Continue current meds.  2. HTN - at goal, continue lopressor and dietary modifcations - she will keep bp log until her next visit  3. OSA screen - several signs and symptoms, she is not interested in sleep testing at this time    F/u 6 months  Arnoldo Lenis, M.D.

## 2015-04-04 ENCOUNTER — Telehealth: Payer: Self-pay | Admitting: *Deleted

## 2015-04-04 NOTE — Telephone Encounter (Signed)
Pt wanting Dr. Harl Bowie to know that she will get labs TSH at her upcoming appt on April 19th at the health clinic in [place]. Will forward to Dr. Harl Bowie as Juluis Rainier

## 2015-04-24 ENCOUNTER — Emergency Department (HOSPITAL_COMMUNITY)
Admission: EM | Admit: 2015-04-24 | Discharge: 2015-04-24 | Disposition: A | Discharge status: Home / Self Care | Payer: Self-pay | Attending: Emergency Medicine | Admitting: Emergency Medicine

## 2015-04-24 ENCOUNTER — Encounter (HOSPITAL_COMMUNITY): Payer: Self-pay | Admitting: Cardiology

## 2015-04-24 DIAGNOSIS — I10 Essential (primary) hypertension: Secondary | ICD-10-CM | POA: Insufficient documentation

## 2015-04-24 DIAGNOSIS — Z720 Tobacco use: Secondary | ICD-10-CM | POA: Insufficient documentation

## 2015-04-24 DIAGNOSIS — M5136 Other intervertebral disc degeneration, lumbar region: Secondary | ICD-10-CM | POA: Insufficient documentation

## 2015-04-24 DIAGNOSIS — Z79899 Other long term (current) drug therapy: Secondary | ICD-10-CM | POA: Insufficient documentation

## 2015-04-24 DIAGNOSIS — M5442 Lumbago with sciatica, left side: Secondary | ICD-10-CM | POA: Insufficient documentation

## 2015-04-24 MED ORDER — DEXAMETHASONE 4 MG PO TABS: 4.0000 mg | ORAL_TABLET | Freq: Two times a day (BID) | ORAL | Status: DC

## 2015-04-24 MED ORDER — DIAZEPAM 5 MG PO TABS
5.0000 mg | ORAL_TABLET | Freq: Once | ORAL | Status: AC
  Administered 2015-04-24: 5 mg via ORAL
  Filled 2015-04-24: qty 1

## 2015-04-24 MED ORDER — BACLOFEN 10 MG PO TABS: 10.0000 mg | ORAL_TABLET | Freq: Three times a day (TID) | ORAL | Status: AC

## 2015-04-24 MED ORDER — DICLOFENAC SODIUM 75 MG PO TBEC: 75.0000 mg | DELAYED_RELEASE_TABLET | Freq: Two times a day (BID) | ORAL | Status: DC

## 2015-04-24 MED ORDER — KETOROLAC TROMETHAMINE 10 MG PO TABS
10.0000 mg | ORAL_TABLET | Freq: Once | ORAL | Status: AC
  Administered 2015-04-24: 10 mg via ORAL
  Filled 2015-04-24: qty 1

## 2015-04-24 MED ORDER — PREDNISONE 50 MG PO TABS
60.0000 mg | ORAL_TABLET | Freq: Once | ORAL | Status: AC
  Administered 2015-04-24: 60 mg via ORAL
  Filled 2015-04-24 (×2): qty 1

## 2015-04-24 NOTE — Discharge Instructions (Signed)
Please schedule an appointment with Dr. Edwinna Areola as soon as possible.  You may want to discuss pain management at that time. Please use diclofenac, Decadron, and baclofen daily. Baclofen may cause drowsiness, please use with caution. Back Pain, Adult Back pain is very common. The pain often gets better over time. The cause of back pain is usually not dangerous. Most people can learn to manage their back pain on their own.  HOME CARE   Stay active. Start with short walks on flat ground if you can. Try to walk farther each day.  Do not sit, drive, or stand in one place for more than 30 minutes. Do not stay in bed.  Do not avoid exercise or work. Activity can help your back heal faster.  Be careful when you bend or lift an object. Bend at your knees, keep the object close to you, and do not twist.  Sleep on a firm mattress. Lie on your side, and bend your knees. If you lie on your back, put a pillow under your knees.  Only take medicines as told by your doctor.  Put ice on the injured area.  Put ice in a plastic bag.  Place a towel between your skin and the bag.  Leave the ice on for 15-20 minutes, 03-04 times a day for the first 2 to 3 days. After that, you can switch between ice and heat packs.  Ask your doctor about back exercises or massage.  Avoid feeling anxious or stressed. Find good ways to deal with stress, such as exercise. GET HELP RIGHT AWAY IF:   Your pain does not go away with rest or medicine.  Your pain does not go away in 1 week.  You have new problems.  You do not feel well.  The pain spreads into your legs.  You cannot control when you poop (bowel movement) or pee (urinate).  Your arms or legs feel weak or lose feeling (numbness).  You feel sick to your stomach (nauseous) or throw up (vomit).  You have belly (abdominal) pain.  You feel like you may pass out (faint). MAKE SURE YOU:   Understand these instructions.  Will watch your condition.  Will  get help right away if you are not doing well or get worse. Document Released: 06/02/2008 Document Revised: 03/08/2012 Document Reviewed: 04/18/2014 [facility] Patient Information 2015 [place], [place]. This information is not intended to replace advice given to you by your health care provider. Make sure you discuss any questions you have with your health care provider.

## 2015-04-24 NOTE — ED Notes (Signed)
MD at bedside. 

## 2015-04-24 NOTE — ED Notes (Signed)
Chronic back pain that is getting worse.

## 2015-04-24 NOTE — ED Provider Notes (Signed)
CSN: [id number]     Arrival date & time 04/24/15  1009 History   First MD Initiated Contact with Patient 04/24/15 1035     Chief Complaint  Patient presents with  . Back Pain     (Consider location/radiation/quality/duration/timing/severity/associated sxs/prior Treatment) Patient is a 29 y.o. female presenting with back pain. The history is provided by the patient.  Back Pain Location:  Lumbar spine Quality:  Aching Pain severity:  Moderate Pain is:  Same all the time Onset quality:  Gradual Timing:  Intermittent Progression:  Worsening Chronicity:  Chronic Context: not jumping from heights, not lifting heavy objects and not recent injury   Context comment:  DDD, chronic back pain Relieved by:  Nothing Worsened by:  Movement Ineffective treatments:  None tried Associated symptoms: no bladder incontinence, no bowel incontinence, no paresthesias and no perianal numbness     Past Medical History  Diagnosis Date  . Hypertension   . Edema   . Degenerative disc disease   . Spondylosis    Past Surgical History  Procedure Laterality Date  . Cholecystectomy     Family History  Problem Relation Age of Onset  . Cancer Other   . Hypertension Other   . Thyroid disease Other   . Hyperlipidemia Other   . Diabetes    . Lung disease    . Arthritis     History  Substance Use Topics  . Smoking status: Current Every Day Smoker -- 1.00 packs/day for 9 years    Types: Cigarettes    Start date: 05/25/2006  . Smokeless tobacco: Never Used  . Alcohol Use: No   OB History    Gravida Para Term Preterm AB TAB SAB Ectopic Multiple Living            0     Review of Systems  Gastrointestinal: Negative for bowel incontinence.  Genitourinary: Negative for bladder incontinence.  Musculoskeletal: Positive for back pain.  Neurological: Negative for paresthesias.  All other systems reviewed and are negative.     Allergies  Review of patient's allergies indicates no known  allergies.  Home Medications   Prior to Admission medications   Medication Sig Start Date End Date Taking? Authorizing Provider  cetirizine (ZYRTEC) 10 MG tablet Take 10 mg by mouth daily.   Yes Historical Provider, MD  gabapentin (NEURONTIN) 300 MG capsule Take 300 mg by mouth at bedtime.    Yes Historical Provider, MD  ibuprofen (ADVIL,MOTRIN) 200 MG tablet Take 800 mg by mouth every 6 (six) hours as needed (pain).    Yes Historical Provider, MD  metoprolol tartrate (LOPRESSOR) 25 MG tablet Take 12.5 mg by mouth 2 (two) times daily.   Yes Historical Provider, MD   BP 130/53 mmHg  Pulse 92  Temp(Src) 98.3 F (36.8 C) (Oral)  Resp 18  Ht 5' 2"  (1.575 m)  Wt 326 lb (147.873 kg)  BMI 59.61 kg/m2  SpO2 95%  LMP 04/17/2015 Physical Exam  Constitutional: She is oriented to person, place, and time. She appears well-developed and well-nourished.  Non-toxic appearance.  HENT:  Head: Normocephalic.  Right Ear: Tympanic membrane and external ear normal.  Left Ear: Tympanic membrane and external ear normal.  Eyes: EOM and lids are normal. Pupils are equal, round, and reactive to light.  Neck: Normal range of motion. Neck supple. Carotid bruit is not present.  Cardiovascular: Normal rate, regular rhythm, normal heart sounds, intact distal pulses and normal pulses.   Pulmonary/Chest: Breath sounds normal. No respiratory distress.  Abdominal: Soft. Bowel sounds are normal. There is no tenderness. There is no guarding.  Musculoskeletal:       Lumbar back: She exhibits decreased range of motion, pain and spasm.       Back:  Lymphadenopathy:       Head (right side): No submandibular adenopathy present.       Head (left side): No submandibular adenopathy present.    She has no cervical adenopathy.  Neurological: She is alert and oriented to person, place, and time. She has normal strength. No cranial nerve deficit or sensory deficit.  Skin: Skin is warm and dry.  Psychiatric: She has a normal  mood and affect. Her speech is normal.  Depressed affect. Tearful. Denies suicidal or homicidal ideations.  Nursing note and vitals reviewed.   ED Course  Procedures (including critical care time) Labs Review Labs Reviewed - No data to display  Imaging Review No results found.   EKG Interpretation None      MDM  Vital signs are well within normal limits. No gross neurologic deficits appreciated. No evidence for cauda equina noted..  The examination supports an exacerbation of chronic back pain. Patient is treated with baclofen, diclofenac, and Decadron. Patient is strongly encouraged to see her primary physician for additional evaluation and management.    Final diagnoses:  Left-sided low back pain with left-sided sciatica  DDD (degenerative disc disease), lumbar    **I have reviewed nursing notes, vital signs, and all appropriate lab and imaging results for this patient.Lily Kocher, PA-C 04/25/15 1594  Elnora Morrison, MD 04/25/15 [phone]

## 2015-06-19 ENCOUNTER — Ambulatory Visit (INDEPENDENT_AMBULATORY_CARE_PROVIDER_SITE_OTHER): Payer: Medicaid Other | Admitting: Obstetrics and Gynecology

## 2015-06-19 ENCOUNTER — Encounter: Payer: Self-pay | Admitting: Obstetrics and Gynecology

## 2015-06-19 VITALS — BP 120/70 | Ht 62.0 in | Wt 338.0 lb

## 2015-06-19 DIAGNOSIS — A630 Anogenital (venereal) warts: Secondary | ICD-10-CM | POA: Diagnosis not present

## 2015-06-19 MED ORDER — PODOFILOX 0.5 % EX GEL: Freq: Two times a day (BID) | CUTANEOUS | Status: DC

## 2015-06-19 MED ORDER — TRICHLOROACETIC ACID 80 % EX LIQD: 1.0000 | Freq: Once | CUTANEOUS | Status: DC

## 2015-06-19 NOTE — Progress Notes (Signed)
Patient ID: Heidi Landry, female   DOB: [birth year], 29 y.o.   MRN: [id number]   [facility] Visit  Patient name: Heidi Landry MRN [id number]  Date of birth: November 21, 1986  CC & HPI:  Heidi Landry is a 29 y.o. female presenting today for diagnosis and planning for internal condyloma. Has concerns if vagina affected.   ROS:  Partner has several warts, will tx himself  Pertinent History Reviewed:   Reviewed: Significant for morbid obesity Medical         Past Medical History  Diagnosis Date  . Hypertension   . Edema   . Degenerative disc disease   . Spondylosis   . Chronic back pain   . Tendonitis   . IBS (irritable bowel syndrome)                               Surgical Hx:    Past Surgical History  Procedure Laterality Date  . Cholecystectomy     Medications: Reviewed & Updated - see associated section                       Current outpatient prescriptions:  .  cetirizine (ZYRTEC) 10 MG tablet, Take 10 mg by mouth daily., Disp: , Rfl:  .  gabapentin (NEURONTIN) 300 MG capsule, Take 300 mg by mouth at bedtime. , Disp: , Rfl:  .  ibuprofen (ADVIL,MOTRIN) 200 MG tablet, Take 800 mg by mouth every 6 (six) hours as needed (pain). , Disp: , Rfl:  .  metoprolol tartrate (LOPRESSOR) 25 MG tablet, Take 12.5 mg by mouth 2 (two) times daily., Disp: , Rfl:  .  podofilox (CONDYLOX) 0.5 % gel, Apply topically 2 (two) times daily. To accessible warts., Disp: 3.5 g, Rfl: 0 .  trichloroacetic acid 80 % LIQD, Apply 1 application topically once. Bring to office for application at appointment, Disp: 15 mL, Rfl: 0   Social History: Reviewed -  reports that she has been smoking Cigarettes.  She started smoking about 9 years ago. She has a 13.5 pack-year smoking history. She has never used smokeless tobacco.  Objective Findings:  Vitals: Blood pressure 120/70, height 5' 2"  (1.575 m), weight 338 lb (153.316 kg), last menstrual period 05/16/2015.  Physical Examination: General  appearance - alert, well appearing, and in no distress, oriented to person, place, and time and overweight Mental status - alert, oriented to person, place, and time, normal mood, behavior, speech, dress, motor activity, and thought processes Abdomen - soft, nontender, nondistended, no masses or organomegaly Massive obesity BMI > 60 Pelvic - VULVA: normal appearing vulva with no masses, tenderness or lesions, EXCEPT  for 1 cm cluster of warts just outside hymen at 7 oclock normal appearing vagina with normal color and discharge, no lesions, CERVIX: normal appearing cervix without discharge or lesions, difficult to see due to obesity , exam chaperoned by AMT   Assessment & Plan:   A:  1. RIGHT LAbial condyloma 2 partner with warts  P:  1. Tx TCA, order condylox for external use. (pt partner not seeking tx)

## 2015-06-19 NOTE — Progress Notes (Signed)
Patient ID: Heidi Landry, female   DOB: 1986/06/06, 29 y.o.   MRN: [id number] Pt here today for an internal vaginal wart. Pt also states that her fiance has this as well. Pt states that her fiance has a number of warts and she would like to discuss this with Dr. Glo Herring. Pt wants to know if she could use the cream. Pt states that she has also had some cramping for about 3 days, periods are usually regular, and cramping/pain starts in the middle of her stomach and radiates to the right side.

## 2015-06-26 ENCOUNTER — Ambulatory Visit: Payer: Self-pay | Admitting: Adult Health

## 2015-06-27 ENCOUNTER — Ambulatory Visit (INDEPENDENT_AMBULATORY_CARE_PROVIDER_SITE_OTHER): Payer: Medicaid Other | Admitting: Obstetrics & Gynecology

## 2015-06-27 ENCOUNTER — Encounter: Payer: Self-pay | Admitting: Obstetrics & Gynecology

## 2015-06-27 VITALS — BP 130/70 | HR 96 | Ht 62.0 in | Wt 333.4 lb

## 2015-06-27 DIAGNOSIS — O0991 Supervision of high risk pregnancy, unspecified, first trimester: Secondary | ICD-10-CM

## 2015-06-27 DIAGNOSIS — O0992 Supervision of high risk pregnancy, unspecified, second trimester: Secondary | ICD-10-CM

## 2015-06-27 DIAGNOSIS — O10912 Unspecified pre-existing hypertension complicating pregnancy, second trimester: Secondary | ICD-10-CM

## 2015-06-27 MED ORDER — LABETALOL HCL 200 MG PO TABS: ORAL_TABLET | ORAL | Status: DC

## 2015-06-27 NOTE — Progress Notes (Signed)
Patient ID: Heidi Landry, female   DOB: [birth year], 29 y.o.   MRN: [id number] Chief Complaint  Patient presents with  . Referral    [redacted] week pregnant/ bp check, sleep apnea.stop taking b/p medicine on Saturday.   Early pregnant Was on ACE, switch to labetalol 100 BID and titrate from there  Needs to use CPAP  Follow up as scheduled for her sonogram evaluation     Face to face time:  10 minutes  Greater than 50% of the visit time was spent in counseling and coordination of care with the patient.  The summary and outline of the counseling and care coordination is summarized in the note above.   All questions were answered.

## 2015-06-28 ENCOUNTER — Telehealth: Payer: Self-pay | Admitting: *Deleted

## 2015-06-28 NOTE — Telephone Encounter (Signed)
Pt states was started on Labetalol 100 mg yesterday, took first dose this am. C/o tightness in chest and discomfort, concerned may be related to Labetalol. Pt informed to go to ER for evaluation. Pt verbalized understanding.

## 2015-07-11 ENCOUNTER — Other Ambulatory Visit: Payer: Self-pay | Admitting: Obstetrics & Gynecology

## 2015-07-11 ENCOUNTER — Ambulatory Visit: Payer: Self-pay | Admitting: Obstetrics and Gynecology

## 2015-07-11 DIAGNOSIS — O3680X0 Pregnancy with inconclusive fetal viability, not applicable or unspecified: Secondary | ICD-10-CM

## 2015-07-12 ENCOUNTER — Ambulatory Visit (INDEPENDENT_AMBULATORY_CARE_PROVIDER_SITE_OTHER): Payer: Medicaid Other

## 2015-07-12 DIAGNOSIS — O3680X0 Pregnancy with inconclusive fetal viability, not applicable or unspecified: Secondary | ICD-10-CM | POA: Diagnosis not present

## 2015-07-12 NOTE — Progress Notes (Signed)
US TV:  42w5dsingle IUP w/ys,pos FHT 176bpm,normal lt ov,unable to see rt ov,crl 13.860m EDD 023/22/2017,limited us[place] of pt body habitus

## 2015-07-17 ENCOUNTER — Encounter: Payer: Self-pay | Admitting: Women's Health

## 2015-07-17 ENCOUNTER — Ambulatory Visit (INDEPENDENT_AMBULATORY_CARE_PROVIDER_SITE_OTHER): Payer: Medicaid Other | Admitting: Women's Health

## 2015-07-17 VITALS — BP 130/68 | HR 84 | Wt 330.0 lb

## 2015-07-17 DIAGNOSIS — O10919 Unspecified pre-existing hypertension complicating pregnancy, unspecified trimester: Secondary | ICD-10-CM | POA: Insufficient documentation

## 2015-07-17 DIAGNOSIS — O99210 Obesity complicating pregnancy, unspecified trimester: Secondary | ICD-10-CM

## 2015-07-17 DIAGNOSIS — Z3A08 8 weeks gestation of pregnancy: Secondary | ICD-10-CM

## 2015-07-17 DIAGNOSIS — B3731 Acute candidiasis of vulva and vagina: Secondary | ICD-10-CM

## 2015-07-17 DIAGNOSIS — O26891 Other specified pregnancy related conditions, first trimester: Secondary | ICD-10-CM

## 2015-07-17 DIAGNOSIS — O09891 Supervision of other high risk pregnancies, first trimester: Secondary | ICD-10-CM | POA: Diagnosis not present

## 2015-07-17 DIAGNOSIS — O09899 Supervision of other high risk pregnancies, unspecified trimester: Secondary | ICD-10-CM | POA: Insufficient documentation

## 2015-07-17 DIAGNOSIS — O99211 Obesity complicating pregnancy, first trimester: Secondary | ICD-10-CM | POA: Diagnosis not present

## 2015-07-17 DIAGNOSIS — F17200 Nicotine dependence, unspecified, uncomplicated: Secondary | ICD-10-CM | POA: Diagnosis not present

## 2015-07-17 DIAGNOSIS — O99331 Smoking (tobacco) complicating pregnancy, first trimester: Secondary | ICD-10-CM | POA: Diagnosis not present

## 2015-07-17 DIAGNOSIS — N898 Other specified noninflammatory disorders of vagina: Secondary | ICD-10-CM

## 2015-07-17 DIAGNOSIS — Z3682 Encounter for antenatal screening for nuchal translucency: Secondary | ICD-10-CM

## 2015-07-17 DIAGNOSIS — Z1389 Encounter for screening for other disorder: Secondary | ICD-10-CM

## 2015-07-17 DIAGNOSIS — O10912 Unspecified pre-existing hypertension complicating pregnancy, second trimester: Secondary | ICD-10-CM

## 2015-07-17 DIAGNOSIS — Z0283 Encounter for blood-alcohol and blood-drug test: Secondary | ICD-10-CM

## 2015-07-17 DIAGNOSIS — Z331 Pregnant state, incidental: Secondary | ICD-10-CM

## 2015-07-17 DIAGNOSIS — O23591 Infection of other part of genital tract in pregnancy, first trimester: Secondary | ICD-10-CM

## 2015-07-17 DIAGNOSIS — B373 Candidiasis of vulva and vagina: Secondary | ICD-10-CM

## 2015-07-17 DIAGNOSIS — Z369 Encounter for antenatal screening, unspecified: Secondary | ICD-10-CM

## 2015-07-17 LAB — POCT URINALYSIS DIPSTICK
Blood, UA: NEGATIVE
Glucose, UA: NEGATIVE
Ketones, UA: NEGATIVE
LEUKOCYTES UA: NEGATIVE
NITRITE UA: NEGATIVE
Protein, UA: NEGATIVE

## 2015-07-17 LAB — POCT WET PREP (WET MOUNT): Clue Cells Wet Prep Whiff POC: NEGATIVE

## 2015-07-17 NOTE — Progress Notes (Signed)
Subjective:  Heidi Landry is a 29 y.o. G40P0 Caucasian female at 34w6dby LMP c/w 7wk u/s, being seen today for her first obstetrical visit.  Her obstetrical history is significant for primigravida, morbid obesity w/ CHTN, smoker: 1.5ppd- wants to quit. Had been on metroprolol prior to pregnancy, [facility] switched her to labetalol 2027mbid- states it dropped bp to 70s/50s- so she decreased herself to 10047m am and bp has been well controlled- she has bp cuff at home.  Pregnancy history fully reviewed.  Patient reports thick vag d/c w/ itching- started using otc monistat and is helping. constipation. . Denies vb, cramping, uti s/s, abnormal/malodorous vag d/c, or vulvovaginal itching/irritation.  BP 130/68 mmHg  Pulse 84  Wt 330 lb (149.687 kg)  LMP 05/16/2015 (Exact Date)  HISTORY: OB History  Gravida Para Term Preterm AB SAB TAB Ectopic Multiple Living  1         0    # Outcome Date GA Lbr Len/2nd Weight Sex Delivery Anes PTL Lv  1 Current              Past Medical History  Diagnosis Date  . Hypertension   . Edema   . Chronic back pain   . Tendonitis   . IBS (irritable bowel syndrome)   . Degenerative disc disease   . Spondylosis    Past Surgical History  Procedure Laterality Date  . Cholecystectomy     Family History  Problem Relation Age of Onset  . Fibromyalgia Mother   . Cancer Father     lung, colon   . Thyroid disease Paternal Aunt   . Diabetes Maternal Grandmother   . Hypertension Maternal Grandmother     Exam   System:     General: Well developed & nourished, no acute distress   Skin: Warm & dry, normal coloration and turgor, no rashes   Neurologic: Alert & oriented, normal mood   Cardiovascular: Regular rate & rhythm   Respiratory: Effort & rate normal, LCTAB, acyanotic   Abdomen: Soft, non tender   Extremities: normal strength, tone   Pelvic Exam:    Perineum: Normal perineum   Vulva: Normal, no lesions   Vagina:  Normal mucosa, thick white d/c c/w  yeast   Cervix: Normal, bulbous, appears closed   Uterus: Normal size/shape/contour for GA   [facility]   Assessment:   Pregnancy: G1P0 Patient Active Problem List   Diagnosis Date Noted  . Supervision of other high-risk pregnancy 07/17/2015    Priority: High  . Chronic hypertension during pregnancy, antepartum 07/17/2015    Priority: High  . Anovulatory amenorrhea 12/25/2014  . Morbid obesity with BMI of 50.0-59.9, adult 12/25/2014  . BV (bacterial vaginosis) 12/25/2014  . Cellulitis of abdominal wall 08/17/2012  . Ovarian cyst, left 08/17/2012  . Morbid obesity 08/17/2012  . Hypertension   . Edema   . Thoracic spine fracture 04/13/2012   Results for orders placed or performed in visit on 07/17/15 (from the past 24 hour(s))  POCT urinalysis dipstick     Status: None   Collection Time: 07/17/15 10:55 AM  Result Value Ref Range   Color, UA     Clarity, UA     Glucose, UA neg    Bilirubin, UA     Ketones, UA neg    Spec Grav, UA     Blood, UA neg    pH, UA  Protein, UA neg    Urobilinogen, UA     Nitrite, UA neg    Leukocytes, UA Negative Negative  POCT Wet Prep [facility] [place])     Status: Abnormal   Collection Time: 07/17/15 11:39 AM  Result Value Ref Range   Source Wet Prep POC vaginal    WBC, Wet Prep HPF POC none    Bacteria Wet Prep HPF POC None None, Few   BACTERIA WET PREP MORPHOLOGY POC     Clue Cells Wet Prep HPF POC None None   Clue Cells Wet Prep Whiff POC Negative Whiff    Yeast Wet Prep HPF POC Few    KOH Wet Prep POC     Trichomonas Wet Prep HPF POC none     55w6dG1P0 New OB visit Smoker Morbid obesity CHTN Vulvovaginal candida Constipation  Plan:  Initial labs drawn including 24hr urine protein Continue prenatal vitamins Problem list reviewed and updated Reviewed n/v relief measures and warning s/s to report Reviewed recommended weight gain based on pre-gravid BMI- ok not to gain any weight.  Has started walking, drinking water, eating better and has already lost a few lbs Encouraged well-balanced diet Genetic Screening discussed Integrated Screen: requested Cystic fibrosis screening discussed requested Ultrasound discussed; fetal survey: requested Follow up in 3 weeks for 1st it/nt, early 2hr gtt, [place] Continue otc mon[place]at 7 Smokes 1.5pp/day, advised cessation, discussed risks to fetus while pregnant, to infant pp, and to herself. Offered [place], accepted, referral sent.    Continue labetalol 1089mq am, if bp creeps up at night, at 10052m pm Baby ASA daily, already taking per visit w/ [facility]  BooTawnya CrookM, WHNPinnacle Cataract And Laser Institute LLC19/2016 11:18 AM

## 2015-07-17 NOTE — Patient Instructions (Addendum)
You will have your sugar test next visit.  Please do not eat or drink anything after midnight the night before you come, not even water.  You will be here for at least two hours.     Nausea & Vomiting  Have saltine crackers or pretzels by your bed and eat a few bites before you raise your head out of bed in the morning  Eat small frequent meals throughout the day instead of large meals  Drink plenty of fluids throughout the day to stay hydrated, just don't drink a lot of fluids with your meals.  This can make your stomach fill up faster making you feel sick  Do not brush your teeth right after you eat  Products with real ginger are good for nausea, like ginger ale and ginger hard candy Make sure it says made with real ginger!  Sucking on sour candy like lemon heads is also good for nausea  If your prenatal vitamins make you nauseated, take them at night so you will sleep through the nausea  Sea Bands  If you feel like you need medicine for the nausea & vomiting please let us know  If you are unable to keep any fluids or food down please let us know   Constipation  Drink plenty of fluid, preferably water, throughout the day  Eat foods high in fiber such as fruits, vegetables, and grains  Exercise, such as walking, is a good way to keep your bowels regular  Drink warm fluids, especially warm prune juice, or decaf coffee  Eat a 1/2 cup of real oatmeal (not instant), 1/2 cup applesauce, and 1/2-1 cup warm prune juice every day  If needed, you may take Colace (docusate sodium) stool softener once or twice a day to help keep the stool soft. If you are pregnant, wait until you are out of your first trimester (12-14 weeks of pregnancy)  If you still are having problems with constipation, you may take Miralax once daily as needed to help keep your bowels regular.  If you are pregnant, wait until you are out of your first trimester (12-14 weeks of pregnancy)   First Trimester of  Pregnancy The first trimester of pregnancy is from week 1 until the end of week 12 (months 1 through 3). A week after a sperm fertilizes an egg, the egg will implant on the wall of the uterus. This embryo will begin to develop into a baby. Genes from you and your partner are forming the baby. The female genes determine whether the baby is a boy or a girl. At 6-8 weeks, the eyes and face are formed, and the heartbeat can be seen on ultrasound. At the end of 12 weeks, all the baby's organs are formed.  Now that you are pregnant, you will want to do everything you can to have a healthy baby. Two of the most important things are to get good prenatal care and to follow your health care provider's instructions. Prenatal care is all the medical care you receive before the baby's birth. This care will help prevent, find, and treat any problems during the pregnancy and childbirth. BODY CHANGES Your body goes through many changes during pregnancy. The changes vary from woman to woman.   You may gain or lose a couple of pounds at first.  You may feel sick to your stomach (nauseous) and throw up (vomit). If the vomiting is uncontrollable, call your health care provider.  You may tire easily.  You may  develop headaches that can be relieved by medicines approved by your health care provider.  You may urinate more often. Painful urination may mean you have a bladder infection.  You may develop heartburn as a result of your pregnancy.  You may develop constipation because certain hormones are causing the muscles that push waste through your intestines to slow down.  You may develop hemorrhoids or swollen, bulging veins (varicose veins).  Your breasts may begin to grow larger and become tender. Your nipples may stick out more, and the tissue that surrounds them (areola) may become darker.  Your gums may bleed and may be sensitive to brushing and flossing.  Dark spots or blotches (chloasma, mask of pregnancy)  may develop on your face. This will likely fade after the baby is born.  Your menstrual periods will stop.  You may have a loss of appetite.  You may develop cravings for certain kinds of food.  You may have changes in your emotions from day to day, such as being excited to be pregnant or being concerned that something may go wrong with the pregnancy and baby.  You may have more vivid and strange dreams.  You may have changes in your hair. These can include thickening of your hair, rapid growth, and changes in texture. Some women also have hair loss during or after pregnancy, or hair that feels dry or thin. Your hair will most likely return to normal after your baby is born. WHAT TO EXPECT AT YOUR PRENATAL VISITS During a routine prenatal visit:  You will be weighed to make sure you and the baby are growing normally.  Your blood pressure will be taken.  Your abdomen will be measured to track your baby's growth.  The fetal heartbeat will be listened to starting around week 10 or 12 of your pregnancy.  Test results from any previous visits will be discussed. Your health care provider may ask you:  How you are feeling.  If you are feeling the baby move.  If you have had any abnormal symptoms, such as leaking fluid, bleeding, severe headaches, or abdominal cramping.  If you have any questions. Other tests that may be performed during your first trimester include:  Blood tests to find your blood type and to check for the presence of any previous infections. They will also be used to check for low iron levels (anemia) and Rh antibodies. Later in the pregnancy, blood tests for diabetes will be done along with other tests if problems develop.  Urine tests to check for infections, diabetes, or protein in the urine.  An ultrasound to confirm the proper growth and development of the baby.  An amniocentesis to check for possible genetic problems.  Fetal screens for spina bifida and  Down syndrome.  You may need other tests to make sure you and the baby are doing well. HOME CARE INSTRUCTIONS  Medicines  Follow your health care provider's instructions regarding medicine use. Specific medicines may be either safe or unsafe to take during pregnancy.  Take your prenatal vitamins as directed.  If you develop constipation, try taking a stool softener if your health care provider approves. Diet  Eat regular, well-balanced meals. Choose a variety of foods, such as meat or vegetable-based protein, fish, milk and low-fat dairy products, vegetables, fruits, and whole grain breads and cereals. Your health care provider will help you determine the amount of weight gain that is right for you.  Avoid raw meat and uncooked cheese. These carry germs  that can cause birth defects in the baby.  Eating four or five small meals rather than three large meals a day may help relieve nausea and vomiting. If you start to feel nauseous, eating a few soda crackers can be helpful. Drinking liquids between meals instead of during meals also seems to help nausea and vomiting.  If you develop constipation, eat more high-fiber foods, such as fresh vegetables or fruit and whole grains. Drink enough fluids to keep your urine clear or pale yellow. Activity and Exercise  Exercise only as directed by your health care provider. Exercising will help you:  Control your weight.  Stay in shape.  Be prepared for labor and delivery.  Experiencing pain or cramping in the lower abdomen or low back is a good sign that you should stop exercising. Check with your health care provider before continuing normal exercises.  Try to avoid standing for long periods of time. Move your legs often if you must stand in one place for a long time.  Avoid heavy lifting.  Wear low-heeled shoes, and practice good posture.  You may continue to have sex unless your health care provider directs you otherwise. Relief of Pain  or Discomfort  Wear a good support bra for breast tenderness.   Take warm sitz baths to soothe any pain or discomfort caused by hemorrhoids. Use hemorrhoid cream if your health care provider approves.   Rest with your legs elevated if you have leg cramps or low back pain.  If you develop varicose veins in your legs, wear support hose. Elevate your feet for 15 minutes, 3-4 times a day. Limit salt in your diet. Prenatal Care  Schedule your prenatal visits by the twelfth week of pregnancy. They are usually scheduled monthly at first, then more often in the last 2 months before delivery.  Write down your questions. Take them to your prenatal visits.  Keep all your prenatal visits as directed by your health care provider. Safety  Wear your seat belt at all times when driving.  Make a list of emergency phone numbers, including numbers for family, friends, the hospital, and police and fire departments. General Tips  Ask your health care provider for a referral to a local prenatal education class. Begin classes no later than at the beginning of month 6 of your pregnancy.  Ask for help if you have counseling or nutritional needs during pregnancy. Your health care provider can offer advice or refer you to specialists for help with various needs.  Do not use hot tubs, steam rooms, or saunas.  Do not douche or use tampons or scented sanitary pads.  Do not cross your legs for long periods of time.  Avoid cat litter boxes and soil used by cats. These carry germs that can cause birth defects in the baby and possibly loss of the fetus by miscarriage or stillbirth.  Avoid all smoking, herbs, alcohol, and medicines not prescribed by your health care provider. Chemicals in these affect the formation and growth of the baby.  Schedule a dentist appointment. At home, brush your teeth with a soft toothbrush and be gentle when you floss. SEEK MEDICAL CARE IF:   You have dizziness.  You have mild  pelvic cramps, pelvic pressure, or nagging pain in the abdominal area.  You have persistent nausea, vomiting, or diarrhea.  You have a bad smelling vaginal discharge.  You have pain with urination.  You notice increased swelling in your face, hands, legs, or ankles. [place]  CARE IF:   You have a fever.  You are leaking fluid from your vagina.  You have spotting or bleeding from your vagina.  You have severe abdominal cramping or pain.  You have rapid weight gain or loss.  You vomit blood or material that looks like coffee grounds.  You are exposed to [place] measles and have never had them.  You are exposed to fifth disease or chickenpox.  You develop a severe headache.  You have shortness of breath.  You have any kind of trauma, such as from a fall or a car accident. Document Released: 12/09/2001 Document Revised: 05/01/2014 Document Reviewed: 10/25/2013 [facility] Patient Information 2015 [place], [place]. This information is not intended to replace advice given to you by your health care provider. Make sure you discuss any questions you have with your health care provider.

## 2015-07-18 ENCOUNTER — Encounter: Payer: Self-pay | Admitting: Women's Health

## 2015-07-18 DIAGNOSIS — Z283 Underimmunization status: Secondary | ICD-10-CM | POA: Insufficient documentation

## 2015-07-18 DIAGNOSIS — O9989 Other specified diseases and conditions complicating pregnancy, childbirth and the puerperium: Secondary | ICD-10-CM

## 2015-07-18 DIAGNOSIS — O09899 Supervision of other high risk pregnancies, unspecified trimester: Secondary | ICD-10-CM | POA: Insufficient documentation

## 2015-07-18 LAB — GC/CHLAMYDIA PROBE AMP
Chlamydia trachomatis, NAA: NEGATIVE
NEISSERIA GONORRHOEAE BY PCR: NEGATIVE

## 2015-07-19 LAB — PMP SCREEN PROFILE (10S), URINE
AMPHETAMINE SCRN UR: NEGATIVE ng/mL
BARBITURATE SCRN UR: NEGATIVE ng/mL
Benzodiazepine Screen, Urine: NEGATIVE ng/mL
Cannabinoids Ur Ql Scn: NEGATIVE ng/mL
Cocaine(Metab.)Screen, Urine: NEGATIVE ng/mL
Creatinine(Crt), U: 11.2 mg/dL — ABNORMAL LOW (ref 20.0–300.0)
METHADONE SCREEN, URINE: NEGATIVE ng/mL
OPIATE SCRN UR: NEGATIVE ng/mL
Oxycodone+Oxymorphone Ur Ql Scn: NEGATIVE ng/mL
PCP Scrn, Ur: NEGATIVE ng/mL
Ph of Urine: 6.3 (ref 4.5–8.9)
Propoxyphene, Screen: NEGATIVE ng/mL

## 2015-07-19 LAB — CBC
HEMATOCRIT: 41.3 % (ref 34.0–46.6)
HEMOGLOBIN: 13.7 g/dL (ref 11.1–15.9)
MCH: 27.8 pg (ref 26.6–33.0)
MCHC: 33.2 g/dL (ref 31.5–35.7)
MCV: 84 fL (ref 79–97)
Platelets: 203 10*3/uL (ref 150–379)
RBC: 4.92 x10E6/uL (ref 3.77–5.28)
RDW: 14.9 % (ref 12.3–15.4)
WBC: 11.1 10*3/uL — AB (ref 3.4–10.8)

## 2015-07-19 LAB — SPECIFIC GRAVITY (REFLEXED): SPECIFIC GRAVITY: 1.0018

## 2015-07-19 LAB — URINALYSIS, ROUTINE W REFLEX MICROSCOPIC
Bilirubin, UA: NEGATIVE
Glucose, UA: NEGATIVE
KETONES UA: NEGATIVE
Leukocytes, UA: NEGATIVE
Nitrite, UA: NEGATIVE
PROTEIN UA: NEGATIVE
RBC, UA: NEGATIVE
Specific Gravity, UA: 1.005 (ref 1.005–1.030)
UUROB: 0.2 mg/dL (ref 0.2–1.0)
pH, UA: 7 (ref 5.0–7.5)

## 2015-07-19 LAB — RUBELLA SCREEN

## 2015-07-19 LAB — HIV ANTIBODY (ROUTINE TESTING W REFLEX): HIV Screen 4th Generation wRfx: NONREACTIVE

## 2015-07-19 LAB — URINE CULTURE: ORGANISM ID, BACTERIA: NO GROWTH

## 2015-07-19 LAB — SYPHILIS: RPR W/REFLEX TO RPR TITER AND TREPONEMAL ANTIBODIES, TRADITIONAL SCREENING AND DIAGNOSIS ALGORITHM: RPR Ser Ql: NONREACTIVE

## 2015-07-19 LAB — ABO/RH: Rh Factor: POSITIVE

## 2015-07-19 LAB — VARICELLA ZOSTER ANTIBODY, IGG: Varicella zoster IgG: 2159 index (ref >165)

## 2015-07-19 LAB — HEPATITIS B SURFACE ANTIGEN: Hepatitis B Surface Ag: NEGATIVE

## 2015-07-19 LAB — ANTIBODY SCREEN: Antibody Screen: NEGATIVE

## 2015-07-24 LAB — CYSTIC FIBROSIS MUTATION 97: GENE DIS ANAL CARRIER INTERP BLD/T-IMP: NOT DETECTED

## 2015-08-07 ENCOUNTER — Ambulatory Visit (INDEPENDENT_AMBULATORY_CARE_PROVIDER_SITE_OTHER): Payer: Medicaid Other

## 2015-08-07 ENCOUNTER — Encounter: Payer: Self-pay | Admitting: Obstetrics & Gynecology

## 2015-08-07 ENCOUNTER — Encounter: Payer: Medicaid Other | Admitting: Obstetrics & Gynecology

## 2015-08-07 ENCOUNTER — Ambulatory Visit (INDEPENDENT_AMBULATORY_CARE_PROVIDER_SITE_OTHER): Payer: Medicaid Other | Admitting: Obstetrics & Gynecology

## 2015-08-07 ENCOUNTER — Other Ambulatory Visit: Payer: Medicaid Other

## 2015-08-07 VITALS — BP 110/60 | HR 80 | Wt 326.0 lb

## 2015-08-07 DIAGNOSIS — Z36 Encounter for antenatal screening of mother: Secondary | ICD-10-CM | POA: Diagnosis not present

## 2015-08-07 DIAGNOSIS — O09891 Supervision of other high risk pregnancies, first trimester: Secondary | ICD-10-CM | POA: Diagnosis not present

## 2015-08-07 DIAGNOSIS — Z131 Encounter for screening for diabetes mellitus: Secondary | ICD-10-CM

## 2015-08-07 DIAGNOSIS — O99211 Obesity complicating pregnancy, first trimester: Secondary | ICD-10-CM | POA: Diagnosis not present

## 2015-08-07 DIAGNOSIS — Z1389 Encounter for screening for other disorder: Secondary | ICD-10-CM | POA: Diagnosis not present

## 2015-08-07 DIAGNOSIS — O10912 Unspecified pre-existing hypertension complicating pregnancy, second trimester: Secondary | ICD-10-CM

## 2015-08-07 DIAGNOSIS — Z331 Pregnant state, incidental: Secondary | ICD-10-CM | POA: Diagnosis not present

## 2015-08-07 DIAGNOSIS — F17200 Nicotine dependence, unspecified, uncomplicated: Secondary | ICD-10-CM

## 2015-08-07 DIAGNOSIS — O10919 Unspecified pre-existing hypertension complicating pregnancy, unspecified trimester: Secondary | ICD-10-CM

## 2015-08-07 DIAGNOSIS — Z3682 Encounter for antenatal screening for nuchal translucency: Secondary | ICD-10-CM

## 2015-08-07 LAB — POCT URINALYSIS DIPSTICK
Blood, UA: NEGATIVE
GLUCOSE UA: NEGATIVE
Ketones, UA: NEGATIVE
Leukocytes, UA: NEGATIVE
NITRITE UA: NEGATIVE

## 2015-08-07 MED ORDER — PRENATAL PLUS 27-1 MG PO TABS: 1.0000 | ORAL_TABLET | Freq: Every day | ORAL | Status: DC

## 2015-08-07 NOTE — Progress Notes (Signed)
[place] 11+6wks single IUP, pos fht 160bpm,crl 59.43m,measurement c/w dates,normal ov's bilat,unable to obtain NT because of pt body habitus

## 2015-08-07 NOTE — Addendum Note (Signed)
Addended by: Florian Buff on: 08/07/2015 02:38 PM   Modules accepted: Orders

## 2015-08-07 NOTE — Progress Notes (Signed)
Fetal Surveillance Testing today:  Sonogram for NT could not see   High Risk Pregnancy Diagnosis(es):   CHTN on labetalol  G1P0 70w6dEstimated Date of Delivery: 02/20/16  Blood pressure 110/60, pulse 80, weight 326 lb (147.873 kg), last menstrual period 05/16/2015.  Urinalysis: Negative   HPI: The patient is being seen today for ongoing management of CHTN . Today she reports no complaints   BP weight and urine results all reviewed and noted. Patient reports good fetal movement, denies any bleeding and no rupture of membranes symptoms or regular contractions.  Fundal Height:  na Fetal Heart rate:  160 Edema:  trace  Patient is without complaints other than noted in her HPI. All questions were answered.  All lab and sonogram results have been reviewed. Comments: normal   Assessment:  1.  Pregnancy at 139w6d Estimated Date of Delivery: 02/20/16 :                          2.  CHT                        3.  Morbid obesity  Medication(s) Plans:  Continue labetalol  Treatment Plan:  Quad screen 2 weeks  Follow up in 2 weeks for appointment for high risk OB care, quad

## 2015-08-07 NOTE — Addendum Note (Signed)
Addended by: Diona Fanti A on: 08/07/2015 10:30 AM   Modules accepted: Orders

## 2015-08-08 ENCOUNTER — Telehealth: Payer: Self-pay | Admitting: Women's Health

## 2015-08-08 ENCOUNTER — Encounter: Payer: Self-pay | Admitting: Women's Health

## 2015-08-08 DIAGNOSIS — O24410 Gestational diabetes mellitus in pregnancy, diet controlled: Secondary | ICD-10-CM

## 2015-08-08 HISTORY — DX: Gestational diabetes mellitus in pregnancy, diet controlled: O24.410

## 2015-08-08 LAB — PROTEIN, URINE, 24 HOUR
Protein, 24H Urine: 312 mg/24 hr — ABNORMAL HIGH (ref 30.0–150.0)
Protein, Ur: 5.2 mg/dL

## 2015-08-08 LAB — GLUCOSE TOLERANCE, 2 HOURS W/ 1HR
GLUCOSE, 1 HOUR: 186 mg/dL — AB (ref 65–179)
GLUCOSE, 2 HOUR: 117 mg/dL (ref 65–152)
Glucose, Fasting: 84 mg/dL (ref 65–91)

## 2015-08-08 NOTE — Telephone Encounter (Signed)
LM for pt to return call, need to notify of elevated early 2hr gtt, dx of A1/B DM, referral to dietician placed.  Roma Schanz, CNM, [facility] 08/08/2015 8:51 AM

## 2015-08-09 ENCOUNTER — Telehealth: Payer: Self-pay | Admitting: *Deleted

## 2015-08-09 NOTE — Telephone Encounter (Signed)
Pt informed of elevated GTT, Dx A1/B DM. Pt aware referral for Nutritionist complete will hear from them for appt date and time.   Pt requesting results from Protein urine 24 hour.

## 2015-08-16 NOTE — Telephone Encounter (Signed)
Pt informed of slightly elevated Protein 24 hour urine per Dr. Elonda Husky, related to undiagnosed diabetic for a period of time. Pt state does have her appt scheduled for next week with Nutritionist.

## 2015-08-16 NOTE — Telephone Encounter (Signed)
That is just a baseline The elevated (slightly elevated) is consistent with her being an undiagnosed diabetic for at least a period of time

## 2015-08-21 ENCOUNTER — Encounter: Payer: Self-pay | Admitting: Obstetrics & Gynecology

## 2015-08-21 ENCOUNTER — Ambulatory Visit (INDEPENDENT_AMBULATORY_CARE_PROVIDER_SITE_OTHER): Payer: Medicaid Other | Admitting: Obstetrics & Gynecology

## 2015-08-21 VITALS — BP 110/60 | HR 72 | Wt 327.4 lb

## 2015-08-21 DIAGNOSIS — O10912 Unspecified pre-existing hypertension complicating pregnancy, second trimester: Secondary | ICD-10-CM

## 2015-08-21 DIAGNOSIS — O10919 Unspecified pre-existing hypertension complicating pregnancy, unspecified trimester: Secondary | ICD-10-CM | POA: Insufficient documentation

## 2015-08-21 DIAGNOSIS — I10 Essential (primary) hypertension: Secondary | ICD-10-CM

## 2015-08-21 DIAGNOSIS — Z1389 Encounter for screening for other disorder: Secondary | ICD-10-CM | POA: Diagnosis not present

## 2015-08-21 DIAGNOSIS — O24410 Gestational diabetes mellitus in pregnancy, diet controlled: Secondary | ICD-10-CM | POA: Diagnosis not present

## 2015-08-21 DIAGNOSIS — O0992 Supervision of high risk pregnancy, unspecified, second trimester: Secondary | ICD-10-CM

## 2015-08-21 DIAGNOSIS — Z331 Pregnant state, incidental: Secondary | ICD-10-CM | POA: Diagnosis not present

## 2015-08-21 DIAGNOSIS — O0990 Supervision of high risk pregnancy, unspecified, unspecified trimester: Secondary | ICD-10-CM | POA: Insufficient documentation

## 2015-08-21 DIAGNOSIS — Z369 Encounter for antenatal screening, unspecified: Secondary | ICD-10-CM

## 2015-08-21 LAB — POCT URINALYSIS DIPSTICK
Blood, UA: NEGATIVE
GLUCOSE UA: NEGATIVE
KETONES UA: NEGATIVE
Leukocytes, UA: NEGATIVE
Nitrite, UA: NEGATIVE
Protein, UA: NEGATIVE

## 2015-08-21 MED ORDER — OMEPRAZOLE 20 MG PO CPDR: 20.0000 mg | DELAYED_RELEASE_CAPSULE | Freq: Every day | ORAL | Status: DC

## 2015-08-21 NOTE — Progress Notes (Signed)
Fetal Surveillance Testing today:  none   High Risk Pregnancy Diagnosis(es):   CHYN, gest diabetes  G1P0 38w6dEstimated Date of Delivery: 02/20/16  Blood pressure 110/60, pulse 72, weight 327 lb 6.4 oz (148.508 kg), last menstrual period 05/16/2015.  Urinalysis: Negative   HPI: The patient is being seen today for ongoing management of as above. Today she reports no problems, BP and CBG are doing well   BP weight and urine results all reviewed and noted. Patient reports good fetal movement, denies any bleeding and no rupture of membranes symptoms or regular contractions.  Fundal Height:   Fetal Heart rate:  160 Edema:    Patient is without complaints other than noted in her HPI. All questions were answered.  All lab and sonogram results have been reviewed. Comments: abnormal:    Assessment:  1.  Pregnancy at [redacted]w[redacted]d Estimated Date of Delivery: 02/20/16 :                          2.  Chronic Hypertension                        3.  A1 DM  Medication(s) Plans:  Labetalol 100 qd, baby ASA, prilosec  Treatment Plan:    Follow up in 3 weeks for appointment for high risk OB care, quad screen

## 2015-08-23 ENCOUNTER — Telehealth: Payer: Self-pay | Admitting: Advanced Practice Midwife

## 2015-08-23 NOTE — Telephone Encounter (Signed)
No it is positional tachycardia, should resolve after being up for a few minutes

## 2015-08-23 NOTE — Telephone Encounter (Signed)
Spoke with Heidi Landry letting her know she doesn't need to be seen and it's positional tachycardia and it should resolve after being up for a few minutes. Heidi Landry was concerned that a high pulse may harm her baby. I spoke with Dr. Glo Herring and he said it should not harm baby. He did advise checking a TSH at Heidi Landry's next appt. [place]

## 2015-08-23 NOTE — Telephone Encounter (Signed)
Spoke with pt. Pt is [redacted] weeks pregnant. BP is good. She is taking Labetalol 100 mg once daily. Pt states her pulse increases during sleep. She got up last night and felt like she was having palpitations. She checked her pulse and it was 119. Please advise. Thanks!! [place]

## 2015-08-29 ENCOUNTER — Encounter: Payer: Medicaid Other | Attending: Certified Nurse Midwife

## 2015-08-29 ENCOUNTER — Telehealth: Payer: Self-pay | Admitting: Women's Health

## 2015-08-29 VITALS — Ht 62.0 in | Wt 329.1 lb

## 2015-08-29 DIAGNOSIS — Z713 Dietary counseling and surveillance: Secondary | ICD-10-CM | POA: Insufficient documentation

## 2015-08-29 DIAGNOSIS — Z3A00 Weeks of gestation of pregnancy not specified: Secondary | ICD-10-CM | POA: Insufficient documentation

## 2015-08-29 DIAGNOSIS — O24410 Gestational diabetes mellitus in pregnancy, diet controlled: Secondary | ICD-10-CM | POA: Insufficient documentation

## 2015-08-29 DIAGNOSIS — O24419 Gestational diabetes mellitus in pregnancy, unspecified control: Secondary | ICD-10-CM

## 2015-08-29 NOTE — Telephone Encounter (Signed)
Pt states she is needing test strips and lancets for the Accu Check Aviva Connect monitor and the fast click lancing device.  Called prescriptions to [facility] in [place] for quantity sufficient for 4X day testing and PRN refills.  Pt informed.

## 2015-09-06 NOTE — Progress Notes (Signed)
  Patient was seen on 08/29/15 for Gestational Diabetes self-management . The following learning objectives were met by the patient :   States the definition of Gestational Diabetes  States why dietary management is important in controlling blood glucose  Describes the effects of carbohydrates on blood glucose levels  Demonstrates ability to create a balanced meal plan  Demonstrates carbohydrate counting   States when to check blood glucose levels  Demonstrates proper blood glucose monitoring techniques  States the effect of stress and exercise on blood glucose levels  States the importance of limiting caffeine and abstaining from alcohol and smoking  Plan:  Aim for 2 Carb Choices per meal (30 grams) +/- 1 either way for breakfast Aim for 3 Carb Choices per meal (45 grams) +/- 1 either way from lunch and dinner Aim for 1-2 Carbs per snack Begin reading food labels for Total Carbohydrate and sugar grams of foods Consider  increasing your activity level by walking daily as tolerated Begin checking BG before breakfast and 2 hours after first bit of breakfast, lunch and dinner after  as directed by MD  Take medication  as directed by MD  Blood glucose monitor given: Glucometer readings at home are running Accu-chek aviva connect Lot # [id number] Exp: 05/28/16 Blood glucose reading: 164m/dl  Patient instructed to monitor glucose levels: FBS: 60 - <90 2 hour: <120  Patient received the following handouts:  Nutrition Diabetes and Pregnancy  Carbohydrate Counting List  Meal Planning worksheet  Patient will be seen for follow-up as needed.

## 2015-09-11 ENCOUNTER — Encounter: Payer: Self-pay | Admitting: Obstetrics & Gynecology

## 2015-09-11 ENCOUNTER — Telehealth: Payer: Self-pay | Admitting: Obstetrics & Gynecology

## 2015-09-11 ENCOUNTER — Ambulatory Visit (INDEPENDENT_AMBULATORY_CARE_PROVIDER_SITE_OTHER): Payer: Medicaid Other | Admitting: Obstetrics & Gynecology

## 2015-09-11 ENCOUNTER — Other Ambulatory Visit: Payer: Medicaid Other

## 2015-09-11 VITALS — BP 110/64 | HR 84 | Temp 98.2°F | Wt 326.3 lb

## 2015-09-11 DIAGNOSIS — Z3201 Encounter for pregnancy test, result positive: Secondary | ICD-10-CM

## 2015-09-11 DIAGNOSIS — Z1389 Encounter for screening for other disorder: Secondary | ICD-10-CM | POA: Diagnosis not present

## 2015-09-11 DIAGNOSIS — Z331 Pregnant state, incidental: Secondary | ICD-10-CM | POA: Diagnosis not present

## 2015-09-11 DIAGNOSIS — O0992 Supervision of high risk pregnancy, unspecified, second trimester: Secondary | ICD-10-CM | POA: Diagnosis not present

## 2015-09-11 DIAGNOSIS — O24410 Gestational diabetes mellitus in pregnancy, diet controlled: Secondary | ICD-10-CM | POA: Diagnosis not present

## 2015-09-11 DIAGNOSIS — O10912 Unspecified pre-existing hypertension complicating pregnancy, second trimester: Secondary | ICD-10-CM | POA: Diagnosis not present

## 2015-09-11 LAB — POCT URINALYSIS DIPSTICK
Blood, UA: NEGATIVE
Glucose, UA: NEGATIVE
Ketones, UA: NEGATIVE
Leukocytes, UA: NEGATIVE
Nitrite, UA: NEGATIVE
Protein, UA: NEGATIVE

## 2015-09-11 MED ORDER — METRONIDAZOLE 500 MG PO TABS: 500.0000 mg | ORAL_TABLET | Freq: Two times a day (BID) | ORAL | Status: DC

## 2015-09-11 MED ORDER — PRENATAL PLUS 27-1 MG PO TABS: ORAL_TABLET | ORAL | Status: DC

## 2015-09-11 MED ORDER — DOCOSAHEXAENOIC ACID 300 MG PO CAPS: 1.0000 | ORAL_CAPSULE | Freq: Every day | ORAL | Status: DC

## 2015-09-11 NOTE — Telephone Encounter (Addendum)
Pt states [facility] did not have the PNV in stock and would be several weeks before they would get them. Spoke with Pharmacist at [facility] and was informed they did fill the Rx for today but it was with an alternative PNV they had in stock.

## 2015-09-11 NOTE — Progress Notes (Signed)
Fetal Surveillance Testing today:  FHR   High Risk Pregnancy Diagnosis(es):   Class A1 DM, chronic hypertension  G1P0 39w6dEstimated Date of Delivery: 02/20/16  Blood pressure 110/64, pulse 84, temperature 98.2 F (36.8 C), weight 326 lb 4.8 oz (148.009 kg), last menstrual period 05/16/2015.  Urinalysis: Negative   HPI: The patient is being seen today for ongoing management of Class A1 DM, chronic hypertension. Today she reports CBG are OK probably will need oral hypoglycemics eventually   BP weight and urine results all reviewed and noted. Patient reports good fetal movement, denies any bleeding and no rupture of membranes symptoms or regular contractions.  Fundal Height:  ? Fetal Heart rate:  163 Edema:  none  Patient is without complaints other than noted in her HPI. All questions were answered.  All lab and sonogram results have been reviewed. Comments:    Assessment:  1.  Pregnancy at 135w6d Estimated Date of Delivery: 02/20/16 :                          2.  Class A1 DM                        3.  Chronic hypertension  Medication(s) Plans:  No changes  Treatment Plan:  Labetalol 100  Return in about 3 weeks (around 10/02/2015). for appointment for high risk OB care  Meds ordered this encounter  Medications  . metroNIDAZOLE (FLAGYL) 500 MG tablet    Sig: Take 1 tablet (500 mg total) by mouth 2 (two) times daily.    Dispense:  14 tablet    Refill:  0  . Docosahexaenoic Acid 300 MG CAPS    Sig: Take 1 tablet by mouth daily.    Dispense:  100 capsule    Refill:  11   Orders Placed This Encounter  Procedures  . [place] OB COMP + 14 WK  . POCT urinalysis dipstick

## 2015-09-12 LAB — AFP TUMOR MARKER: AFP-Tumor Marker: 83.4 ng/mL — ABNORMAL HIGH (ref 0.0–8.3)

## 2015-09-13 ENCOUNTER — Encounter: Payer: Self-pay | Admitting: Cardiology

## 2015-09-13 ENCOUNTER — Ambulatory Visit (INDEPENDENT_AMBULATORY_CARE_PROVIDER_SITE_OTHER): Payer: Medicaid Other | Admitting: Cardiology

## 2015-09-13 VITALS — BP 114/64 | HR 97 | Ht 61.0 in | Wt 326.0 lb

## 2015-09-13 DIAGNOSIS — R002 Palpitations: Secondary | ICD-10-CM | POA: Diagnosis not present

## 2015-09-13 DIAGNOSIS — G4733 Obstructive sleep apnea (adult) (pediatric): Secondary | ICD-10-CM | POA: Diagnosis not present

## 2015-09-13 NOTE — Progress Notes (Signed)
Patient ID: Eltha Tingley, female   DOB: 06-27-1986, 29 y.o.   MRN: [id number]     Clinical Summary Ms. Ouida Sills is a 29 y.o.female seen today for follow up of the following medical problems.   1. Palpitations - 48 hr holter with no symptoms reported and no arrhythmias - started on lopressor 12.27m bid which resolved symptoms - beta blocker changed to labetalol due to her pregnancy by ob/gyn. She is on labetalol 1061mqday - notes some palpitations when drinking caffeine, but otherwise doing well. .    2. OSA screen - + apneic episodes, + snoring, some daytime somnolence - she was not interested in a sleep study but has changed her mind since last visit.    Past Medical History  Diagnosis Date  . Hypertension   . Edema   . Chronic back pain   . Tendonitis   . IBS (irritable bowel syndrome)   . Degenerative disc disease   . Spondylosis   . Gestational diabetes mellitus, antepartum      No Known Allergies   Current Outpatient Prescriptions  Medication Sig Dispense Refill  . acetaminophen (TYLENOL) 325 MG suppository Place 325 mg rectally every 4 (four) hours as needed.    . Marland Kitchenspirin 81 MG tablet Take 81 mg by mouth daily.    . cetirizine (ZYRTEC) 10 MG tablet Take 10 mg by mouth daily.    . Docosahexaenoic Acid 300 MG CAPS Take 1 tablet by mouth daily. 100 capsule 11  . gabapentin (NEURONTIN) 300 MG capsule Take 300 mg by mouth at bedtime.     . Marland Kitchenbuprofen (ADVIL,MOTRIN) 200 MG tablet Take 800 mg by mouth every 6 (six) hours as needed (pain).     . Marland Kitchenabetalol (NORMODYNE) 200 MG tablet Take 1 tablet twice daily (Patient taking differently: 100 mg once. Take 1 tablet twice daily) 60 tablet 3  . metoprolol tartrate (LOPRESSOR) 25 MG tablet Take 12.5 mg by mouth 2 (two) times daily.    . metroNIDAZOLE (FLAGYL) 500 MG tablet Take 1 tablet (500 mg total) by mouth 2 (two) times daily. 14 tablet 0  . Miconazole Nitrate (MONISTAT 7 COMBO PACK APP VA) Place vaginally.    . Marland Kitchenmeprazole  (PRILOSEC) 20 MG capsule Take 1 capsule (20 mg total) by mouth daily. 1 tablet a day 30 capsule 6  . podofilox (CONDYLOX) 0.5 % gel Apply topically 2 (two) times daily. To accessible warts. (Patient not taking: Reported on 09/11/2015) 3.5 g 0  . prenatal vitamin w/FE, FA (PRENATAL 1 + 1) 27-1 MG TABS tablet Take 1 tablet by mouth daily at 12 noon. 100 each 11  . prenatal vitamin w/FE, FA (PRENATAL 1 + 1) 27-1 MG TABS tablet One tablet daily 30 each 11  . trichloroacetic acid 80 % LIQD Apply 1 application topically once. Bring to office for application at appointment (Patient not taking: Reported on 09/11/2015) 15 mL 0   No current facility-administered medications for this visit.     Past Surgical History  Procedure Laterality Date  . Cholecystectomy       No Known Allergies    Family History  Problem Relation Age of Onset  . Fibromyalgia Mother   . Cancer Father     lung, colon   . Thyroid disease Paternal Aunt   . Diabetes Maternal Grandmother   . Hypertension Maternal Grandmother   . Cancer Paternal Uncle   . Brain cancer Paternal Uncle   . Throat cancer Paternal Uncle  Social History Ms. Ouida Sills reports that she has been smoking Cigarettes.  She started smoking about 9 years ago. She has a 13.5 pack-year smoking history. She has never used smokeless tobacco. Ms. Ouida Sills reports that she does not drink alcohol.   Review of Systems CONSTITUTIONAL: No weight loss, fever, chills, weakness or fatigue.  HEENT: Eyes: No visual loss, blurred vision, double vision or yellow sclerae.No hearing loss, sneezing, congestion, runny nose or sore throat.  SKIN: No rash or itching.  CARDIOVASCULAR: per HPI RESPIRATORY: No shortness of breath, cough or sputum.  GASTROINTESTINAL: No anorexia, nausea, vomiting or diarrhea. No abdominal pain or blood.  GENITOURINARY: No burning on urination, no polyuria NEUROLOGICAL: No headache, dizziness, syncope, paralysis, ataxia, numbness or  tingling in the extremities. No change in bowel or bladder control.  MUSCULOSKELETAL: No muscle, back pain, joint pain or stiffness.  LYMPHATICS: No enlarged nodes. No history of splenectomy.  PSYCHIATRIC: No history of depression or anxiety.  ENDOCRINOLOGIC: No reports of sweating, cold or heat intolerance. No polyuria or polydipsia.  Marland Kitchen   Physical Examination Filed Vitals:   09/13/15 1036  BP: 114/64  Pulse: 97   Filed Vitals:   09/13/15 1036  Height: 5' 1"  (1.549 m)  Weight: 326 lb (147.873 kg)    Gen: resting comfortably, no acute distress HEENT: no scleral icterus, pupils equal round and reactive, no palptable cervical adenopathy,  CV: RRR, no m/r/g, no JVD Resp: Clear to auscultation bilaterally GI: abdomen is soft, non-tender, non-distended, normal bowel sounds, no hepatosplenomegaly MSK: extremities are warm, no edema.  Skin: warm, no rash Neuro:  no focal deficits Psych: appropriate affect     Assessment and Plan  1. Palpitations - no significant arrhythmias on holter - symptoms improved on beta blocker, continue labetalol in setting of current pregnancy this is the preferred beta blocker.   2. OSA screen - several signs and symptoms, she is willing to have sleep study. Will refer her to Dr Luan Pulling.      F/u 6 months Arnoldo Lenis, M.D.

## 2015-09-13 NOTE — Patient Instructions (Signed)
   Referral to Dr. Luan Pulling for sleep apnea evaluation. Continue all current medications. Your physician wants you to follow up in: 6 months.  You will receive a reminder letter in the mail one-two months in advance.  If you don't receive a letter, please call our office to schedule the follow up appointment

## 2015-09-14 LAB — STATUS REPORT

## 2015-09-14 LAB — SPECIMEN STATUS REPORT

## 2015-09-24 ENCOUNTER — Encounter: Payer: Self-pay | Admitting: Obstetrics and Gynecology

## 2015-09-24 ENCOUNTER — Ambulatory Visit (INDEPENDENT_AMBULATORY_CARE_PROVIDER_SITE_OTHER): Payer: Medicaid Other | Admitting: Obstetrics and Gynecology

## 2015-09-24 ENCOUNTER — Telehealth: Payer: Self-pay | Admitting: *Deleted

## 2015-09-24 VITALS — BP 120/60 | HR 93 | Temp 98.3°F | Wt 330.0 lb

## 2015-09-24 DIAGNOSIS — O09891 Supervision of other high risk pregnancies, first trimester: Secondary | ICD-10-CM

## 2015-09-24 DIAGNOSIS — O10912 Unspecified pre-existing hypertension complicating pregnancy, second trimester: Secondary | ICD-10-CM | POA: Diagnosis not present

## 2015-09-24 DIAGNOSIS — Z1389 Encounter for screening for other disorder: Secondary | ICD-10-CM | POA: Diagnosis not present

## 2015-09-24 DIAGNOSIS — Z331 Pregnant state, incidental: Secondary | ICD-10-CM

## 2015-09-24 DIAGNOSIS — O0992 Supervision of high risk pregnancy, unspecified, second trimester: Secondary | ICD-10-CM

## 2015-09-24 DIAGNOSIS — O24410 Gestational diabetes mellitus in pregnancy, diet controlled: Secondary | ICD-10-CM

## 2015-09-24 DIAGNOSIS — O10919 Unspecified pre-existing hypertension complicating pregnancy, unspecified trimester: Secondary | ICD-10-CM

## 2015-09-24 LAB — POCT URINALYSIS DIPSTICK
Blood, UA: NEGATIVE
Glucose, UA: NEGATIVE
KETONES UA: NEGATIVE
LEUKOCYTES UA: NEGATIVE
Nitrite, UA: NEGATIVE
PROTEIN UA: NEGATIVE

## 2015-09-24 NOTE — Progress Notes (Signed)
Patient ID: Heidi Landry, female   DOB: [birth year], 29 y.o.   MRN: [id number]   High Risk Pregnancy Diagnosis(es):   Chronic HTN  G1P0 65w5dEstimated Date of Delivery: 02/20/16  Blood pressure 120/60, pulse 93, temperature 98.3 F (36.8 C), weight 330 lb (149.687 kg), last menstrual period 05/16/2015.  Urinalysis: Negative   HPI: Pt denies any complaints at this time. She notes fluid accumulation in her abdomen. Pt denies ankle swelling.   BP weight and urine results all reviewed and noted. Patient reports good fetal movement, denies any bleeding and no rupture of membranes symptoms or regular contractions.  Fundal Height:  U-2 Fetal Heart rate:  132 bpm Edema:  minimal  Patient is without complaints. All questions were answered.  Assessment:  152w5d  G1P0 Advised regular exercise, including water activity   Medication(s) Plans:  Labetalol-200 mg  Treatment Plan:  Pt to continue Labetalol as prescribed, increase activity-level  Follow up in 4 weeks for OB appt, routine care   This chart was scribed for JoJonnie KindMD by CaTula NakayamaMedical Scribe. This patient was seen in room 1 and the patient's care was started at 4:36 PM.   I personally performed the services described in this documentation, which was SCRIBED in my presence. The recorded information has been reviewed and considered accurate. It has been edited as necessary during review. FEJonnie KindMD

## 2015-09-24 NOTE — Telephone Encounter (Signed)
Pt states that about a week ago the bottom of her stomach, she has found a pocket of fluid. Pt denies any fever or redness but states that if she puts pressure there that it leaves an indention. Pt advised to be safe we would need to see her to just check things out. Pt verbalized understanding. Phone call was switched to front office and appointment was given.

## 2015-09-24 NOTE — Progress Notes (Signed)
Pt worked in today for swelling in the bottom of her stomach. Pt has a history cellulitis and states that she is afraid that the swelling is starting that again. Pt states that she has been having palpitations today and headaches as well.

## 2015-09-26 LAB — AFP, QUAD SCREEN
DIA VALUE (EIA): 289.5 pg/mL
DSR (By Age)    1 IN: 639
MATERNAL AGE AT EDD: 30.7 a
MSAFP: 57.5 ng/mL
MSHCG: 37187 m[IU]/mL
T18 (By Age): 1:2489 {titer}
uE3 Value: 0.62 ng/mL

## 2015-09-26 LAB — SPECIMEN STATUS REPORT

## 2015-10-02 ENCOUNTER — Ambulatory Visit (INDEPENDENT_AMBULATORY_CARE_PROVIDER_SITE_OTHER): Payer: Medicaid Other | Admitting: Obstetrics & Gynecology

## 2015-10-02 ENCOUNTER — Ambulatory Visit (INDEPENDENT_AMBULATORY_CARE_PROVIDER_SITE_OTHER): Payer: Medicaid Other

## 2015-10-02 VITALS — BP 108/50 | HR 80 | Wt 325.5 lb

## 2015-10-02 DIAGNOSIS — O10919 Unspecified pre-existing hypertension complicating pregnancy, unspecified trimester: Secondary | ICD-10-CM

## 2015-10-02 DIAGNOSIS — Z23 Encounter for immunization: Secondary | ICD-10-CM

## 2015-10-02 DIAGNOSIS — O0992 Supervision of high risk pregnancy, unspecified, second trimester: Secondary | ICD-10-CM

## 2015-10-02 DIAGNOSIS — O10912 Unspecified pre-existing hypertension complicating pregnancy, second trimester: Secondary | ICD-10-CM | POA: Diagnosis not present

## 2015-10-02 DIAGNOSIS — Z331 Pregnant state, incidental: Secondary | ICD-10-CM | POA: Diagnosis not present

## 2015-10-02 DIAGNOSIS — O24419 Gestational diabetes mellitus in pregnancy, unspecified control: Secondary | ICD-10-CM

## 2015-10-02 DIAGNOSIS — Z1389 Encounter for screening for other disorder: Secondary | ICD-10-CM

## 2015-10-02 DIAGNOSIS — O24410 Gestational diabetes mellitus in pregnancy, diet controlled: Secondary | ICD-10-CM | POA: Diagnosis not present

## 2015-10-02 DIAGNOSIS — O09892 Supervision of other high risk pregnancies, second trimester: Secondary | ICD-10-CM

## 2015-10-02 LAB — POCT URINALYSIS DIPSTICK
Blood, UA: NEGATIVE
Glucose, UA: NEGATIVE
Ketones, UA: NEGATIVE
Leukocytes, UA: NEGATIVE
Nitrite, UA: NEGATIVE
Protein, UA: NEGATIVE

## 2015-10-02 MED ORDER — GLYBURIDE 2.5 MG PO TABS: ORAL_TABLET | ORAL | Status: DC

## 2015-10-02 NOTE — Progress Notes (Signed)
[place] 19+6wks,measurements c/w dates,efw 373 g,breech,post pl gr 0, complete previa,cx 4.5cm(TV),fhr 145 bpm,limited view of head,face,heart,and CI, because of pt body habitus. Please have pt come back for additional images.

## 2015-10-02 NOTE — Progress Notes (Signed)
Fetal Surveillance Testing today:  sonogram   High Risk Pregnancy Diagnosis(es):   Chronic hypertension, Class A2/B DM, lymphedema of the abdominal wall  G1P0 29w6dEstimated Date of Delivery: 02/20/16  Blood pressure 108/50, pulse 80, weight 325 lb 8 oz (147.646 kg), last menstrual period 05/16/2015.  Urinalysis: Negative   HPI: The patient is being seen today for ongoing management of chronic hypertension, gest diabetes. Today she reports fluid in her belly.  The etiology of this is not third spacing or regular fluid, the reason she has this happening is due to her pannus disrupting lymphatic vessels and causing a peau d'orange type of skin change.  I informed the patient this is not due to fluid overload or third spacing or really related to any other reason except the obstruction of lymphatic vessles in the abdominal wall due to her panniculus.  She understands.  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 In the past, she had a staph infection of the abdominal wall requiring 1 week admission for antibiotics, vanc I am sure   BP weight and urine results all reviewed and noted. Patient reports good fetal movement, denies any bleeding and no rupture of membranes symptoms or regular contractions.  Fundal Height:  Sonogram groing well Fetal Heart rate:  145 Edema:  1+  Patient is without complaints other than noted in her HPI. All questions were answered.  All lab and sonogram results have been reviewed. Comments: abnormal:    Assessment:  1.  Pregnancy at 150w6d Estimated Date of Delivery: 02/20/16 :                          2.  Class A2/B diabetes, starting glyburide 2.5 mg qhs                        3.  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 for appointment for high risk OB care  Meds ordered this encounter  Medications  . glyBURIDE (DIABETA) 2.5 MG tablet    Sig: Take 1 tablet at bedtime    Dispense:  30 tablet    Refill:  3   Orders Placed This Encounter  Procedures  . Flu Vaccine QUAD 36+ mos IM  . POCT urinalysis dipstick

## 2015-10-16 ENCOUNTER — Ambulatory Visit (INDEPENDENT_AMBULATORY_CARE_PROVIDER_SITE_OTHER): Payer: Medicaid Other | Admitting: Obstetrics & Gynecology

## 2015-10-16 VITALS — BP 100/60 | HR 92 | Wt 323.0 lb

## 2015-10-16 DIAGNOSIS — Z331 Pregnant state, incidental: Secondary | ICD-10-CM | POA: Diagnosis not present

## 2015-10-16 DIAGNOSIS — O10912 Unspecified pre-existing hypertension complicating pregnancy, second trimester: Secondary | ICD-10-CM

## 2015-10-16 DIAGNOSIS — O24419 Gestational diabetes mellitus in pregnancy, unspecified control: Secondary | ICD-10-CM | POA: Diagnosis not present

## 2015-10-16 DIAGNOSIS — Z1389 Encounter for screening for other disorder: Secondary | ICD-10-CM

## 2015-10-16 DIAGNOSIS — O0992 Supervision of high risk pregnancy, unspecified, second trimester: Secondary | ICD-10-CM | POA: Diagnosis not present

## 2015-10-16 LAB — POCT URINALYSIS DIPSTICK
GLUCOSE UA: NEGATIVE
KETONES UA: NEGATIVE
Leukocytes, UA: NEGATIVE
Nitrite, UA: NEGATIVE
PROTEIN UA: NEGATIVE
RBC UA: NEGATIVE

## 2015-10-16 NOTE — Progress Notes (Signed)
Fetal Surveillance Testing today:  none   High Risk Pregnancy Diagnosis(es):   Chronic hypertension, Class A2/B DM, morbid obesity  G1P0 27w6dEstimated Date of Delivery: 02/20/16  Blood pressure 100/60, pulse 92, weight 323 lb (146.512 kg), last menstrual period 05/16/2015.  Urinalysis: Negative   HPI: The patient is being seen today for ongoing management of as above. Today she reports CBG are much better, no other complaints   BP weight and urine results all reviewed and noted. Patient reports good fetal movement, denies any bleeding and no rupture of membranes symptoms or regular contractions.  Fundal Height:  30, difficult with her abdominal  Fetal Heart rate:  145 Edema:  none  Patient is without complaints other than noted in her HPI. All questions were answered.  All lab and sonogram results have been reviewed. Comments: abnormal: 2 hour   Assessment:  1.  Pregnancy at 276w6d Estimated Date of Delivery: 02/20/16 :                          2.  CHTN                        3.  Class A2/B DM  Medication(s) Plans:  Continue labetalol and glyburide  Treatment Plan:  Per protocol and as clinically indicated  No Follow-up on file. for appointment for high risk OB care  No orders of the defined types were placed in this encounter.   Orders Placed This Encounter  Procedures  . POCT urinalysis dipstick

## 2015-11-06 ENCOUNTER — Encounter: Payer: Self-pay | Admitting: Obstetrics & Gynecology

## 2015-11-06 ENCOUNTER — Ambulatory Visit (INDEPENDENT_AMBULATORY_CARE_PROVIDER_SITE_OTHER): Payer: Medicaid Other | Admitting: Obstetrics & Gynecology

## 2015-11-06 VITALS — BP 120/80 | HR 72 | Wt 325.0 lb

## 2015-11-06 DIAGNOSIS — Z331 Pregnant state, incidental: Secondary | ICD-10-CM | POA: Diagnosis not present

## 2015-11-06 DIAGNOSIS — O24419 Gestational diabetes mellitus in pregnancy, unspecified control: Secondary | ICD-10-CM | POA: Diagnosis not present

## 2015-11-06 DIAGNOSIS — O0992 Supervision of high risk pregnancy, unspecified, second trimester: Secondary | ICD-10-CM

## 2015-11-06 DIAGNOSIS — Z1389 Encounter for screening for other disorder: Secondary | ICD-10-CM

## 2015-11-06 DIAGNOSIS — O10912 Unspecified pre-existing hypertension complicating pregnancy, second trimester: Secondary | ICD-10-CM

## 2015-11-06 LAB — POCT URINALYSIS DIPSTICK
Glucose, UA: NEGATIVE
KETONES UA: NEGATIVE
Leukocytes, UA: NEGATIVE
Nitrite, UA: NEGATIVE
PROTEIN UA: NEGATIVE
RBC UA: NEGATIVE

## 2015-11-06 NOTE — Progress Notes (Signed)
Fetal Surveillance Testing today:  FHR   High Risk Pregnancy Diagnosis(es):   Chronic hypertension, Class A2/B DM, morbid obesity  G1P0 39w6dEstimated Date of Delivery: 02/20/16  Blood pressure 120/80, pulse 72, weight 325 lb (147.419 kg), last menstrual period 05/16/2015.  Urinalysis: Negative   HPI: The patient is being seen today for ongoing management of as above. Today she reports mucousy discharge   BP weight and urine results all reviewed and noted. Patient reports good fetal movement, denies any bleeding and no rupture of membranes symptoms or regular contractions.  Fundal Height:  10 cm  Above = umbilicus + 10 Fetal Heart rate:  145 Edema:  stable  Patient is without complaints other than noted in her HPI. All questions were answered.  All lab and sonogram results have been reviewed. Comments: abnormal 2 hour  Assessment:  1.  Pregnancy at 264w6d Estimated Date of Delivery: 02/20/16 :                          2.  Class A2/B diabetes                        3.  Chronic hypertension, stable  Medication(s) Plans:  No changes labetalol 100 daily and glybr=uride 2.5 mg qhs  Treatment Plan:  Sonogram in 3 weeks  Return in about 2 weeks (around 11/20/2015) for HROB. for appointment for high risk OB care  No orders of the defined types were placed in this encounter.   Orders Placed This Encounter  Procedures  . POCT urinalysis dipstick

## 2015-11-12 ENCOUNTER — Other Ambulatory Visit (HOSPITAL_COMMUNITY): Payer: Self-pay | Admitting: Respiratory Therapy

## 2015-11-12 DIAGNOSIS — G4730 Sleep apnea, unspecified: Secondary | ICD-10-CM

## 2015-11-20 ENCOUNTER — Ambulatory Visit (INDEPENDENT_AMBULATORY_CARE_PROVIDER_SITE_OTHER): Payer: Medicaid Other

## 2015-11-20 ENCOUNTER — Other Ambulatory Visit: Payer: Self-pay | Admitting: Obstetrics & Gynecology

## 2015-11-20 ENCOUNTER — Ambulatory Visit (INDEPENDENT_AMBULATORY_CARE_PROVIDER_SITE_OTHER): Payer: Medicaid Other | Admitting: Obstetrics & Gynecology

## 2015-11-20 VITALS — BP 126/68 | HR 72 | Wt 328.0 lb

## 2015-11-20 DIAGNOSIS — Z331 Pregnant state, incidental: Secondary | ICD-10-CM | POA: Diagnosis not present

## 2015-11-20 DIAGNOSIS — O0992 Supervision of high risk pregnancy, unspecified, second trimester: Secondary | ICD-10-CM

## 2015-11-20 DIAGNOSIS — Z3A27 27 weeks gestation of pregnancy: Secondary | ICD-10-CM | POA: Diagnosis not present

## 2015-11-20 DIAGNOSIS — O24419 Gestational diabetes mellitus in pregnancy, unspecified control: Secondary | ICD-10-CM

## 2015-11-20 DIAGNOSIS — O10919 Unspecified pre-existing hypertension complicating pregnancy, unspecified trimester: Secondary | ICD-10-CM

## 2015-11-20 DIAGNOSIS — Z1389 Encounter for screening for other disorder: Secondary | ICD-10-CM

## 2015-11-20 DIAGNOSIS — O10912 Unspecified pre-existing hypertension complicating pregnancy, second trimester: Secondary | ICD-10-CM

## 2015-11-20 DIAGNOSIS — O09893 Supervision of other high risk pregnancies, third trimester: Secondary | ICD-10-CM | POA: Diagnosis not present

## 2015-11-20 LAB — POCT URINALYSIS DIPSTICK
Blood, UA: NEGATIVE
Glucose, UA: NEGATIVE
Ketones, UA: NEGATIVE
LEUKOCYTES UA: NEGATIVE
NITRITE UA: NEGATIVE
PROTEIN UA: NEGATIVE

## 2015-11-20 NOTE — Progress Notes (Signed)
[place] W/TV:  26+6wks,measurements c/w dates,efw 1096g 62%,afi 22cm,post pl gr 1,complete placenta previa,cx 3.7cm,bilat adnexa's wnl,breech pos,fhr 146 bpm,anatomy complete

## 2015-11-20 NOTE — Progress Notes (Signed)
Fetal Surveillance Testing today:  Sonogram reassuring see report   High Risk Pregnancy Diagnosis(es):   CHTN, A2 DM  G1P0 72w6dEstimated Date of Delivery: 02/20/16  Blood pressure 126/68, pulse 72, weight 328 lb (148.78 kg), last menstrual period 05/16/2015.  Urinalysis: Negative   HPI: The patient is being seen today for ongoing management of as above. Today she reports CBG excellent   BP weight and urine results all reviewed and noted. Patient reports good fetal movement, denies any bleeding and no rupture of membranes symptoms or regular contractions.  Fundal Height:  U+22cm Fetal Heart rate:  145 Edema:  3+  Patient is without complaints other than noted in her HPI. All questions were answered.  All lab and sonogram results have been reviewed. Comments: abnormal:    Assessment:  1.  Pregnancy at 278w6d Estimated Date of Delivery: 02/20/16 :                          2.  CHTN                        3.  A2 DM                        4.  Morbid obesity                          Medication(s) Plans:  No changes  Treatment Plan:  Per protocol  No Follow-up on file. for appointment for high risk OB care  No orders of the defined types were placed in this encounter.   Orders Placed This Encounter  Procedures  . POCT urinalysis dipstick

## 2015-11-26 ENCOUNTER — Ambulatory Visit: Payer: Medicaid Other | Attending: Pulmonary Disease | Admitting: Sleep Medicine

## 2015-11-26 DIAGNOSIS — G4730 Sleep apnea, unspecified: Secondary | ICD-10-CM

## 2015-11-26 DIAGNOSIS — G4733 Obstructive sleep apnea (adult) (pediatric): Secondary | ICD-10-CM | POA: Insufficient documentation

## 2015-11-30 ENCOUNTER — Inpatient Hospital Stay (HOSPITAL_COMMUNITY)
Admission: AD | Admit: 2015-11-30 | Discharge: 2015-12-02 | DRG: 781 | Disposition: A | Discharge status: Home / Self Care | Payer: Medicaid Other | Source: Ambulatory Visit | Attending: Obstetrics & Gynecology | Admitting: Obstetrics & Gynecology

## 2015-11-30 ENCOUNTER — Encounter (HOSPITAL_COMMUNITY): Payer: Self-pay

## 2015-11-30 ENCOUNTER — Inpatient Hospital Stay (HOSPITAL_COMMUNITY): Payer: Medicaid Other

## 2015-11-30 DIAGNOSIS — Z8249 Family history of ischemic heart disease and other diseases of the circulatory system: Secondary | ICD-10-CM | POA: Diagnosis not present

## 2015-11-30 DIAGNOSIS — O26893 Other specified pregnancy related conditions, third trimester: Principal | ICD-10-CM | POA: Diagnosis present

## 2015-11-30 DIAGNOSIS — O26873 Cervical shortening, third trimester: Secondary | ICD-10-CM

## 2015-11-30 DIAGNOSIS — F17210 Nicotine dependence, cigarettes, uncomplicated: Secondary | ICD-10-CM

## 2015-11-30 DIAGNOSIS — O4693 Antepartum hemorrhage, unspecified, third trimester: Secondary | ICD-10-CM

## 2015-11-30 DIAGNOSIS — O4413 Placenta previa with hemorrhage, third trimester: Secondary | ICD-10-CM

## 2015-11-30 DIAGNOSIS — N930 Postcoital and contact bleeding: Secondary | ICD-10-CM | POA: Diagnosis present

## 2015-11-30 DIAGNOSIS — Z6841 Body Mass Index (BMI) 40.0 and over, adult: Secondary | ICD-10-CM | POA: Diagnosis not present

## 2015-11-30 DIAGNOSIS — O99213 Obesity complicating pregnancy, third trimester: Secondary | ICD-10-CM | POA: Diagnosis present

## 2015-11-30 DIAGNOSIS — Z833 Family history of diabetes mellitus: Secondary | ICD-10-CM | POA: Diagnosis not present

## 2015-11-30 DIAGNOSIS — O24410 Gestational diabetes mellitus in pregnancy, diet controlled: Secondary | ICD-10-CM

## 2015-11-30 DIAGNOSIS — Z3A28 28 weeks gestation of pregnancy: Secondary | ICD-10-CM

## 2015-11-30 DIAGNOSIS — O4690 Antepartum hemorrhage, unspecified, unspecified trimester: Secondary | ICD-10-CM | POA: Diagnosis present

## 2015-11-30 DIAGNOSIS — O10913 Unspecified pre-existing hypertension complicating pregnancy, third trimester: Secondary | ICD-10-CM | POA: Diagnosis not present

## 2015-11-30 DIAGNOSIS — O10919 Unspecified pre-existing hypertension complicating pregnancy, unspecified trimester: Secondary | ICD-10-CM

## 2015-11-30 DIAGNOSIS — O99333 Smoking (tobacco) complicating pregnancy, third trimester: Secondary | ICD-10-CM | POA: Diagnosis present

## 2015-11-30 DIAGNOSIS — O24415 Gestational diabetes mellitus in pregnancy, controlled by oral hypoglycemic drugs: Secondary | ICD-10-CM | POA: Diagnosis present

## 2015-11-30 DIAGNOSIS — O368132 Decreased fetal movements, third trimester, fetus 2: Secondary | ICD-10-CM

## 2015-11-30 LAB — CBC
HEMATOCRIT: 38 % (ref 36.0–46.0)
Hemoglobin: 12.8 g/dL (ref 12.0–15.0)
MCH: 29.3 pg (ref 26.0–34.0)
MCHC: 33.7 g/dL (ref 30.0–36.0)
MCV: 87 fL (ref 78.0–100.0)
Platelets: 148 10*3/uL — ABNORMAL LOW (ref 150–400)
RBC: 4.37 MIL/uL (ref 3.87–5.11)
RDW: 14.5 % (ref 11.5–15.5)
WBC: 12.6 10*3/uL — ABNORMAL HIGH (ref 4.0–10.5)

## 2015-11-30 LAB — URINALYSIS, ROUTINE W REFLEX MICROSCOPIC
BILIRUBIN URINE: NEGATIVE
Glucose, UA: NEGATIVE mg/dL
KETONES UR: NEGATIVE mg/dL
Leukocytes, UA: NEGATIVE
Nitrite: NEGATIVE
PROTEIN: NEGATIVE mg/dL
Specific Gravity, Urine: 1.01 (ref 1.005–1.030)
pH: 6.5 (ref 5.0–8.0)

## 2015-11-30 LAB — URINE MICROSCOPIC-ADD ON: WBC, UA: NONE SEEN WBC/hpf (ref 0–5)

## 2015-11-30 LAB — TYPE AND SCREEN
ABO/RH(D): O POS
Antibody Screen: NEGATIVE

## 2015-11-30 LAB — ABO/RH: ABO/RH(D): O POS

## 2015-11-30 LAB — GLUCOSE, CAPILLARY
Glucose-Capillary: 86 mg/dL (ref 65–99)
Glucose-Capillary: 143 mg/dL — ABNORMAL HIGH (ref 65–99)
Glucose-Capillary: 123 mg/dL — ABNORMAL HIGH (ref 65–99)

## 2015-11-30 MED ORDER — LACTATED RINGERS IV SOLN
INTRAVENOUS | Status: DC
  Administered 2015-11-30 – 2015-12-01 (×2): via INTRAVENOUS

## 2015-11-30 MED ORDER — ASPIRIN 81 MG PO CHEW
81.0000 mg | CHEWABLE_TABLET | Freq: Every day | ORAL | Status: DC
  Administered 2015-11-30 – 2015-12-02 (×3): 81 mg via ORAL
  Filled 2015-11-30 (×4): qty 1

## 2015-11-30 MED ORDER — ASPIRIN 81 MG PO TABS
81.0000 mg | ORAL_TABLET | Freq: Every day | ORAL | Status: DC
  Filled 2015-11-30 (×2): qty 1

## 2015-11-30 MED ORDER — PRENATAL MULTIVITAMIN CH: 1.0000 | ORAL_TABLET | Freq: Every day | ORAL | Status: DC

## 2015-11-30 MED ORDER — ACETAMINOPHEN 325 MG PO TABS: 650.0000 mg | ORAL_TABLET | ORAL | Status: DC | PRN

## 2015-11-30 MED ORDER — BETAMETHASONE SOD PHOS & ACET 6 (3-3) MG/ML IJ SUSP
12.0000 mg | INTRAMUSCULAR | Status: AC
  Administered 2015-11-30 – 2015-12-01 (×2): 12 mg via INTRAMUSCULAR
  Filled 2015-11-30 (×2): qty 2

## 2015-11-30 MED ORDER — PANTOPRAZOLE SODIUM 40 MG PO TBEC
40.0000 mg | DELAYED_RELEASE_TABLET | Freq: Every day | ORAL | Status: DC
  Administered 2015-11-30 – 2015-12-02 (×3): 40 mg via ORAL
  Filled 2015-11-30 (×3): qty 1

## 2015-11-30 MED ORDER — LACTATED RINGERS IV BOLUS (SEPSIS)
1000.0000 mL | Freq: Once | INTRAVENOUS | Status: AC
  Administered 2015-11-30 (×2): 1000 mL via INTRAVENOUS

## 2015-11-30 MED ORDER — LABETALOL HCL 100 MG PO TABS
100.0000 mg | ORAL_TABLET | Freq: Every day | ORAL | Status: DC
Start: 2015-11-30 — End: 2015-12-02
  Administered 2015-11-30 – 2015-12-02 (×3): 100 mg via ORAL
  Filled 2015-11-30 (×3): qty 1

## 2015-11-30 MED ORDER — DOCUSATE SODIUM 100 MG PO CAPS
100.0000 mg | ORAL_CAPSULE | Freq: Every day | ORAL | Status: DC
  Administered 2015-11-30 – 2015-12-02 (×3): 100 mg via ORAL
  Filled 2015-11-30 (×3): qty 1

## 2015-11-30 MED ORDER — GLYBURIDE 2.5 MG PO TABS
2.5000 mg | ORAL_TABLET | Freq: Every day | ORAL | Status: DC
  Administered 2015-11-30 – 2015-12-01 (×2): 2.5 mg via ORAL
  Filled 2015-11-30 (×2): qty 1

## 2015-11-30 MED ORDER — ZOLPIDEM TARTRATE 5 MG PO TABS: 5.0000 mg | ORAL_TABLET | Freq: Every evening | ORAL | Status: DC | PRN

## 2015-11-30 MED ORDER — NIFEDIPINE 10 MG PO CAPS
10.0000 mg | ORAL_CAPSULE | ORAL | Status: AC
  Administered 2015-11-30 (×3): 10 mg via ORAL
  Filled 2015-11-30 (×3): qty 1

## 2015-11-30 MED ORDER — PRENATAL PLUS 27-1 MG PO TABS
1.0000 | ORAL_TABLET | Freq: Every day | ORAL | Status: DC
  Administered 2015-11-30 – 2015-12-02 (×3): 1 via ORAL
  Filled 2015-11-30 (×3): qty 1

## 2015-11-30 MED ORDER — CALCIUM CARBONATE ANTACID 500 MG PO CHEW: 2.0000 | CHEWABLE_TABLET | ORAL | Status: DC | PRN

## 2015-11-30 NOTE — MAU Provider Note (Signed)
History     CSN: [id number]  Arrival date and time: 11/30/15 5053   First Provider Initiated Contact with Patient 11/30/15 [phone]      Chief Complaint  Patient presents with  . Decreased Fetal Movement  . Vaginal Bleeding   HPI Comments: Heidi Landry is a 29 y.o. G1P0 at 28w2dwho presents today with vaginal bleeding. She states that when she woke up this morning she had a large spot of blood in her underwear. She states that she continued to have pink and brown spotting. She states that she has had intercourse in the last 2-3 days. She was not told that she could not have intercourse at this time. Noted to have complete previa on [place] 11/20/15. She is also having some intermittent cramping. She states that prior to arrival she was not feeling the baby move, but she the baby is moving normally now that she has been here.   She has GDM, and is on glyburide. She also has CHTN on labetalol 1049mQday.   Vaginal Bleeding The patient's primary symptoms include vaginal bleeding. This is a new problem. The current episode started today. The problem occurs intermittently. The problem has been unchanged. The pain is mild (1/10). The problem affects both sides. She is pregnant. The vaginal discharge was bloody and brown. The vaginal bleeding is spotting. She has not been passing clots. She has not been passing tissue. Nothing aggravates the symptoms. She has tried nothing for the symptoms. She is sexually active (last intercourse about 3 days ). She uses nothing for contraception.    Past Medical History  Diagnosis Date  . Hypertension   . Edema   . Chronic back pain   . Tendonitis   . IBS (irritable bowel syndrome)   . Degenerative disc disease   . Spondylosis   . Gestational diabetes mellitus, antepartum     Past Surgical History  Procedure Laterality Date  . Cholecystectomy      Family History  Problem Relation Age of Onset  . Fibromyalgia Mother   . Cancer Father     lung,  colon   . Thyroid disease Paternal Aunt   . Diabetes Maternal Grandmother   . Hypertension Maternal Grandmother   . Cancer Paternal Uncle   . Brain cancer Paternal Uncle   . Throat cancer Paternal Uncle     Social History  Substance Use Topics  . Smoking status: Current Every Day Smoker -- 1.50 packs/day for 9 years    Types: Cigarettes    Start date: 05/25/2006  . Smokeless tobacco: Never Used  . Alcohol Use: No    Allergies: No Known Allergies  Prescriptions prior to admission  Medication Sig Dispense Refill Last Dose  . acetaminophen (TYLENOL) 325 MG suppository Place 325 mg rectally every 4 (four) hours as needed.   11/29/2015 at 1300  . aspirin 81 MG tablet Take 81 mg by mouth daily.   11/29/2015 at 0900  . cetirizine (ZYRTEC) 10 MG tablet Take 10 mg by mouth daily.   11/29/2015 at 0900  . glyBURIDE (DIABETA) 2.5 MG tablet Take 1 tablet at bedtime 30 tablet 3 11/29/2015 at Unknown time  . labetalol (NORMODYNE) 200 MG tablet Take 100 mg by mouth daily.   11/29/2015 at 0900  . omeprazole (PRILOSEC) 20 MG capsule Take 1 capsule (20 mg total) by mouth daily. 1 tablet a day 30 capsule 6 11/29/2015 at Unknown time  . prenatal vitamin w/FE, FA (PRENATAL 1 + 1) 27-1 MG  TABS tablet Take 1 tablet by mouth daily at 12 noon. 100 each 11 11/29/2015 at Unknown time  . Docosahexaenoic Acid 300 MG CAPS Take 1 tablet by mouth daily. (Patient not taking: Reported on 10/02/2015) 100 capsule 11 Not Taking  . Miconazole Nitrate (MONISTAT 7 COMBO PACK APP VA) Place vaginally.   Taking  . podofilox (CONDYLOX) 0.5 % gel Apply topically 2 (two) times daily. To accessible warts. (Patient not taking: Reported on 10/02/2015) 3.5 g 0 Not Taking  . trichloroacetic acid 80 % LIQD Apply 1 application topically once. Bring to office for application at appointment (Patient not taking: Reported on 10/02/2015) 15 mL 0 Not Taking    Review of Systems  Genitourinary: Positive for vaginal bleeding.   Physical Exam    Blood pressure 109/51, pulse 107, temperature 98.3 F (36.8 C), temperature source Oral, resp. rate 20, height 5' 2"  (1.575 m), weight 148.871 kg (328 lb 3.2 oz), last menstrual period 05/16/2015, SpO2 98 %.  Physical Exam  Nursing note and vitals reviewed. Constitutional: She is oriented to person, place, and time. She appears well-developed and well-nourished. No distress.  HENT:  Head: Normocephalic.  Eyes: Pupils are equal, round, and reactive to light.  Cardiovascular: Normal rate.   Respiratory: Effort normal.  GI: Soft. There is no tenderness. There is no rebound.  Neurological: She is alert and oriented to person, place, and time.  Skin: Skin is warm and dry.  Psychiatric: She has a normal mood and affect.   FHT: 140, moderate, no accels, no decels. Difficult to trace due to body habitus  Toco: q 2-3 min contractions  MAU Course  Procedures  MDM LR bolus, procardia, [place] ordered  0800: Care turned over to [facility], CNM   Mathis Bud  11/30/2015 8:06 AM   Assessment and Plan   Contractions resolved after Procardia doses x 3.  BMZ injection first dose given.  Plan for admission related to bleeding with low-lying placenta/previa.  [place] today with indication that previa likely resolved.  Will evaluate for length of stay needed tomorrow but second dose of BMZ ordered in 24 hours.  1. Vaginal bleeding in pregnancy, third trimester   2. Decreased fetal movement in pregnancy, antepartum, third trimester, fetus 2   4. [redacted] weeks gestation of pregnancy   5. Obesity complicating pregnancy in third trimester   6. Hypertension in pregnancy, pre-existing   7. Tobacco use in pregnancy, third trimester   8. Diet controlled gestational diabetes mellitus in third trimester   9. Partial previa, third trimester    Admit to antepartum EFM Q shift IV at [facility] Diabetic diet BMZ tomorrow am for second dose See H&P  LEFTW[place]-KIRBY, Kaedin Hicklin, CNM 8:05 PM

## 2015-11-30 NOTE — MAU Note (Signed)
Pt woke up to go to bathroom and noticed pink blood on underwear. Noticed more when wiping. Known placenta previa. Denies pain. Also having DFM. Last felt movement yesterday.

## 2015-11-30 NOTE — H&P (Signed)
Heidi Landry is a 29 y.o. female G1P0 @[redacted]w[redacted]d  pt of [facility] presenting for vaginal bleeding in third trimester of pregnancy. She had recent US on 8/22 with concern for partial vs complete previa, but pt not aware of diagnosis or restrictions.  Pt had intercourse 2 days ago. This morning, she woke up with large spot of blood, ~ 4-5 inches in her underwear and through to her clothes. Since this first episode, she has seen pink when wiping only.  She also reports frequent yeast infections but denies current symptoms. She has hx significant for morbid obesity, A1/A2 GDM, and chronic HTN.  She is taking daily ASA, labatelol 100 mg daily, and glyburide 2.5 mg daily.     [place]  Initiated Care at  [place], [place], 2nd baby, married recently  Dating By LMP c/w 7wk u/s  Pap RCHD normal  GC/CT Initial:    -/-            36+wks:  Genetic Screen Nt/it: unable to get NT   AFP:  CF screen neg  Anatomic [place]   Flu vaccine   Tdap Recommended ~ 28wks  Glucose Screen  Early 2 hr abnormal: 84/186/117=A1/B DM  GBS   Feed Preference   Contraception   Circumcision   Childbirth Classes   Pediatrician      Maternal Medical History:  Reason for admission: Vaginal bleeding.  Nausea.  Contractions: Frequency: irregular.   Perceived severity is mild.    Fetal activity: Perceived fetal activity is normal.   Last perceived fetal movement was within the past hour.    Prenatal complications: PIH and placental abnormality.   Prenatal Complications - Diabetes: gestational. Diabetes is managed by oral agent (monotherapy).      OB History    Gravida Para Term Preterm AB TAB SAB Ectopic Multiple Living   1         0     Past Medical History  Diagnosis Date  . Hypertension   . Edema   . Chronic back pain   . Tendonitis   . IBS (irritable bowel syndrome)   . Degenerative disc disease   . Spondylosis   . Gestational diabetes mellitus, antepartum    Past Surgical History   Procedure Laterality Date  . Cholecystectomy     Family History: family history includes Brain cancer in her paternal uncle; Cancer in her father and paternal uncle; Diabetes in her maternal grandmother; Fibromyalgia in her mother; Hypertension in her maternal grandmother; Throat cancer in her paternal uncle; Thyroid disease in her paternal aunt. Social History:  reports that she has been smoking Cigarettes.  She started smoking about 9 years ago. She has a 13.5 pack-year smoking history. She has never used smokeless tobacco. She reports that she does not drink alcohol or use illicit drugs.   Prenatal Transfer Tool  Maternal Diabetes: Yes:  Diabetes Type:  Insulin/Medication controlled Genetic Screening: Abnormal:  Results: Other: Maternal Ultrasounds/Referrals: Abnormal:  Findings:   Other: Fetal Ultrasounds or other Referrals:  None Maternal Substance Abuse:  No Significant Maternal Medications:  Meds include: Protonix Other:  Significant Maternal Lab Results:  Lab values include: Other:  Other Comments:  Quad screen results pending; [place] with partial previa resolved 12/2; Protonix, ASA, glyburide, labetalol; GBS unknown  Review of Systems  Constitutional: Negative for fever, chills and malaise/fatigue.  Eyes: Negative for blurred vision.  Respiratory: Negative for cough and shortness of breath.   Cardiovascular: Negative for chest pain.  Gastrointestinal: Negative for heartburn, nausea, vomiting, abdominal pain, diarrhea and constipation.  Genitourinary: Negative for dysuria, urgency and frequency.  Musculoskeletal: Negative.   Neurological: Negative for dizziness and headaches.  Psychiatric/Behavioral: Negative for depression.  All other systems reviewed and are negative.     Blood pressure 143/58, pulse 107, temperature 98.3 F (36.8 C), temperature source Oral, resp. rate 20, height 5' 2"  (1.575 m), weight 328 lb 3.2 oz (148.871 kg), last menstrual period 05/16/2015, SpO2 98  %. Maternal Exam:  Uterine Assessment: Contraction strength is mild.  Contraction duration is 60 seconds. Contraction frequency is irregular.   Abdomen: Patient reports no abdominal tenderness. Fetal presentation: breech  Introitus: Not examined r/t hx previa     Fetal Exam Fetal Monitor Review: Mode: ultrasound.   Baseline rate: 145--Tracing inconsistent r/t body habitus, BPP 6/8, with absent breathing.  Variability: moderate (6-25 bpm).   Pattern: accelerations present and no decelerations.    Fetal State Assessment: Category I - tracings are normal.     Physical Exam  Nursing note and vitals reviewed. Constitutional: She is oriented to person, place, and time. She appears well-developed and well-nourished.  Neck: Normal range of motion.  Cardiovascular: Normal rate.   Respiratory: Effort normal.  GI: Soft.  Musculoskeletal: Normal range of motion.  Neurological: She is alert and oriented to person, place, and time.  Skin: Skin is warm and dry.  Psychiatric: She has a normal mood and affect. Her behavior is normal. Judgment and thought content normal.    Prenatal labs: ABO, Rh: --/--/O POS, O POS (12/02 0800) Antibody: NEG (12/02 0800) Rubella: <0.90 (07/19 1153) RPR: Non Reactive (07/19 1153)  HBsAg: Negative (07/19 1153)  HIV: Non Reactive (07/19 1153)  GBS:     Assessment/Plan: Vaginal bleeding in pregnancy, third trimester   Decreased fetal movement in pregnancy, antepartum, third trimester, fetus 2   [redacted] weeks gestation of pregnancy   Obesity complicating pregnancy in third trimester   Hypertension in pregnancy, pre-existing   Tobacco use in pregnancy, third trimester   Diet controlled gestational diabetes mellitus in third trimester   Partial previa, third trimester       Admit to antepartum Carb modified diet Pad counts Q shift monitoring IV fluids KVO BMZ first dose given, second dose ordered tomorrow am  LEFTWICH-KIRBY, LISA 11/30/2015, 12:09  PM

## 2015-12-01 ENCOUNTER — Encounter: Payer: Self-pay | Admitting: Neurology

## 2015-12-01 LAB — GLUCOSE, CAPILLARY
GLUCOSE-CAPILLARY: 115 mg/dL — AB (ref 65–99)
GLUCOSE-CAPILLARY: 82 mg/dL (ref 65–99)
GLUCOSE-CAPILLARY: 104 mg/dL — AB (ref 65–99)
Glucose-Capillary: 82 mg/dL (ref 65–99)
Glucose-Capillary: 132 mg/dL — ABNORMAL HIGH (ref 65–99)

## 2015-12-01 MED ORDER — NIFEDIPINE 10 MG PO CAPS
10.0000 mg | ORAL_CAPSULE | Freq: Once | ORAL | Status: AC
  Administered 2015-12-01: 10 mg via ORAL

## 2015-12-01 MED ORDER — NIFEDIPINE 10 MG PO CAPS
10.0000 mg | ORAL_CAPSULE | ORAL | Status: AC
  Filled 2015-12-01: qty 1

## 2015-12-01 MED ORDER — NIFEDIPINE ER OSMOTIC RELEASE 30 MG PO TB24
30.0000 mg | ORAL_TABLET | Freq: Two times a day (BID) | ORAL | Status: DC
  Administered 2015-12-01 – 2015-12-02 (×3): 30 mg via ORAL
  Filled 2015-12-01 (×3): qty 1

## 2015-12-01 MED ORDER — NIFEDIPINE 10 MG PO CAPS
10.0000 mg | ORAL_CAPSULE | ORAL | Status: AC
  Administered 2015-12-01 (×3): 10 mg via ORAL
  Filled 2015-12-01 (×3): qty 1

## 2015-12-01 NOTE — Sleep Study (Signed)
[place] A. Merlene Laughter, MD     [email or web address]             NOCTURNAL POLYSOMNOGRAPHY   LOCATION: [facility]  CLINICAL INFORMATION Sleep Study Type: Split Night CPAP Indication for sleep study: N/A Epworth Sleepiness Score: 14 SLEEP STUDY TECHNIQUE As per the AASM Manual for the Scoring of Sleep and Associated Events v2.3 (April 2016) with a hypopnea requiring 4% desaturations. The channels recorded and monitored were frontal, central and occipital EEG, electrooculogram (EOG), submentalis EMG (chin), nasal and oral airflow, thoracic and abdominal wall motion, anterior tibialis EMG, snore microphone, electrocardiogram, and pulse oximetry. Continuous positive airway pressure (CPAP) was initiated when the patient met split night criteria and was titrated according to treat sleep-disordered breathing. MEDICATIONS Medications taken by the patient : N/A Medications administered by patient during sleep study : No sleep medicine administered. Prior to Admission medications   Medication Sig Start Date End Date Taking? Authorizing Provider  acetaminophen (TYLENOL) 325 MG suppository Place 325 mg rectally every 4 (four) hours as needed.    Historical Provider, MD  aspirin 81 MG tablet Take 81 mg by mouth daily.    Historical Provider, MD  cetirizine (ZYRTEC) 10 MG tablet Take 10 mg by mouth daily.    Historical Provider, MD  Docosahexaenoic Acid 300 MG CAPS Take 1 tablet by mouth daily. Patient not taking: Reported on 10/02/2015 09/11/15   Florian Buff, MD  glyBURIDE (DIABETA) 2.5 MG tablet Take 1 tablet at bedtime 10/02/15   Florian Buff, MD  labetalol (NORMODYNE) 200 MG tablet Take 100 mg by mouth daily.    Historical Provider, MD  Miconazole Nitrate (MONISTAT 7 COMBO PACK APP VA) Place vaginally.    Historical Provider, MD  omeprazole (PRILOSEC) 20 MG capsule Take 1 capsule (20 mg total) by mouth daily. 1 tablet a day 08/21/15   Florian Buff, MD  podofilox (CONDYLOX) 0.5 %  gel Apply topically 2 (two) times daily. To accessible warts. Patient not taking: Reported on 10/02/2015 06/19/15   Jonnie Kind, MD  prenatal vitamin w/FE, FA (PRENATAL 1 + 1) 27-1 MG TABS tablet Take 1 tablet by mouth daily at 12 noon. 08/07/15   Florian Buff, MD  trichloroacetic acid 80 % LIQD Apply 1 application topically once. Bring to office for application at appointment Patient not taking: Reported on 10/02/2015 06/19/15   Jonnie Kind, MD     RESPIRATORY PARAMETERS Diagnostic Total AHI (/hr): 50.5 RDI (/hr): 50.5 OA Index (/hr): 1.3 CA Index (/hr): 0.4 REM AHI (/hr): 80.0 NREM AHI (/hr): 50.6 Supine AHI (/hr): 62.7 Non-supine AHI (/hr): 44.77 Min O2 Sat (%): 74.00 Mean O2 (%): 86.58 Time below 88% (min): 89.5   Titration Optimal Pressure (cm):  AHI at Optimal Pressure (/hr): N/A Min O2 at Optimal Pressure (%): 89.00 Supine % at Optimal (%): N/A Sleep % at Optimal (%): N/A   SLEEP ARCHITECTURE The recording time for the entire night was 295.3 minutes. During a baseline period of 179.9 minutes, the patient slept for 141.5 minutes in REM and nonREM, yielding a sleep efficiency of 78.6%. Sleep onset after lights out was 12.8 minutes with a REM latency of 160.5 minutes. The patient spent 4.24% of the night in stage N1 sleep, 65.72% in stage N2 sleep, 28.98% in stage N3 and 1.06% in REM. During the titration period of 103.4 minutes, the patient slept for 24.5 minutes in REM and nonREM, yielding a sleep efficiency of 23.7%. Sleep onset after CPAP  initiation was 70.9 minutes with a REM latency of N/A minutes. The patient spent 24.49% of the night in stage N1 sleep, 75.51% in stage N2 sleep, 0.00% in stage N3 and 0.00% in REM. CARDIAC DATA The 2 lead EKG demonstrated sinus rhythm. The mean heart rate was N/A beats per minute. Other EKG findings include: Ventricular Tachycardia. LEG MOVEMENT DATA The total Periodic Limb Movements of Sleep (PLMS) were 0. The PLMS index was  0.00.   IMPRESSIONS -Severe obstructive sleep apnea occurred during the diagnostic portion of the study (AHI = 50.5/hour). An optimal PAP pressure could not be selected for this patient based on the available study data. Trial of AutoPAP 10-20 is suggested.     Heidi Metz, MD Diplomate, American Board of Sleep Medicine.

## 2015-12-01 NOTE — Progress Notes (Addendum)
Patient ID: Heidi Landry, female   DOB: 01/05/1986, 29 y.o.   MRN: [id number] FACULTY PRACTICE ANTEPARTUM(COMPREHENSIVE) NOTE  CALLED TO SEE PT THIS AM DUE  TO PATIENT NOTICING CONTRACTIONS, WITH MINIMAL OLD BLOOD PER VAGINA.  Heidi Landry is a 29 y.o. G1P0 at [redacted]w[redacted]d who is admitted for POSTCOITAL BLEEDING, WITH history of LOW LYING PLACENTA/question of previa., AND MORBID OBESITY  Yesterdays u/s shows no previa, and a posterior placenta. Fetal presentation is cephalic. Length of Stay:  1  Days  Subjective: Pt with mild discomfort Patient reports the fetal movement as active. Patient reports uterine contraction  activity as regular, every 4 minutes. Patient reports  vaginal bleeding as scant staining. Patient describes fluid per vagina as None.  Vitals:  Blood pressure 137/72, pulse 91, temperature 98.5 F (36.9 C), temperature source Oral, resp. rate 18, height 5' 2"  (1.575 m), weight 148.871 kg (328 lb 3.2 oz), last menstrual period 05/16/2015, SpO2 98 %. Physical Examination:  General appearance - alert, well appearing, and in no distress and overweight Heart - normal rate and regular rhythm Abdomen - soft, nontender, nondistended Fundal Height:  size equals dates Cervical Exam: Not evaluated. and found to be not evaluated/ / Extremities: extremities normal, atraumatic, no cyanosis or edema and Homans sign is negative, no sign of DVT with DTRs 2+ bilaterally Membranes:intact  Fetal Monitoring:  Baseline: 155 bpm, Variability: Good {> 6 bpm), Accelerations: Reactive, Decelerations: Absent and contraction noted q 437m, procardia given  Labs:  Results for orders placed or performed during the hospital encounter of 11/30/15 (from the past 24 hour(s))  Urinalysis, Routine w reflex microscopic ([facility]  Collection Time: 11/30/15  6:46 AM  Result Value Ref Range   Color, Urine YELLOW YELLOW   APPearance CLEAR CLEAR   Specific Gravity, Urine 1.010 1.005 - 1.030   pH 6.5 5.0 -  8.0   Glucose, UA NEGATIVE NEGATIVE mg/dL   Hgb urine dipstick MODERATE (A) NEGATIVE   Bilirubin Urine NEGATIVE NEGATIVE   Ketones, ur NEGATIVE NEGATIVE mg/dL   Protein, ur NEGATIVE NEGATIVE mg/dL   Nitrite NEGATIVE NEGATIVE   Leukocytes, UA NEGATIVE NEGATIVE  Urine microscopic-add on   Collection Time: 11/30/15  6:46 AM  Result Value Ref Range   Squamous Epithelial / LPF 0-5 (A) NONE SEEN   WBC, UA NONE SEEN 0 - 5 WBC/hpf   RBC / HPF 0-5 0 - 5 RBC/hpf   Bacteria, UA RARE (A) NONE SEEN  CBC   Collection Time: 11/30/15  8:00 AM  Result Value Ref Range   WBC 12.6 (H) 4.0 - 10.5 K/uL   RBC 4.37 3.87 - 5.11 MIL/uL   Hemoglobin 12.8 12.0 - 15.0 g/dL   HCT 38.0 36.0 - 46.0 %   MCV 87.0 78.0 - 100.0 fL   MCH 29.3 26.0 - 34.0 pg   MCHC 33.7 30.0 - 36.0 g/dL   RDW 14.5 11.5 - 15.5 %   Platelets 148 (L) 150 - 400 K/uL  Type and screen [place] Collection Time: 11/30/15  8:00 AM  Result Value Ref Range   ABO/RH(D) O POS    Antibody Screen NEG    Sample Expiration 12/03/2015   ABO/Rh   Collection Time: 11/30/15  8:00 AM  Result Value Ref Range   ABO/RH(D) O POS   Glucose, capillary   Collection Time: 11/30/15  9:43 AM  Result Value Ref Range   Glucose-Capillary 86 65 - 99 mg/dL  Glucose, capillary  Collection Time: 11/30/15  2:20 PM  Result Value Ref Range   Glucose-Capillary 143 (H) 65 - 99 mg/dL  Glucose, capillary   Collection Time: 11/30/15  7:35 PM  Result Value Ref Range   Glucose-Capillary 123 (H) 65 - 99 mg/dL  Glucose, capillary   Collection Time: 12/01/15  1:26 AM  Result Value Ref Range   Glucose-Capillary 115 (H) 65 - 99 mg/dL    Imaging Studies:    Yesterday's u/s shows NO PREVIA, and placenta not considered low lying by trans vaginal u/s Medications:  Scheduled . aspirin  81 mg Oral Daily  . betamethasone acetate-betamethasone sodium phosphate  12 mg Intramuscular Q24 Hr x 2  . docusate sodium  100 mg Oral Daily  . glyBURIDE  2.5  mg Oral QHS  . labetalol  100 mg Oral Daily  . NIFEdipine  10 mg Oral Q1 Hr x 3  . NIFEdipine  10 mg Oral Once  . pantoprazole  40 mg Oral Daily  . prenatal vitamin w/FE, FA  1 tablet Oral Q1200   I have reviewed the patient's current medications.  ASSESSMENT: Patient Active Problem List   Diagnosis Date Noted  . Vaginal bleeding in pregnancy 11/30/2015  . Chronic hypertension complicating or reason for care during pregnancy 08/21/2015  . Gestational diabetes mellitus, class A1/B 08/08/2015  . Rubella non-immune status, antepartum 07/18/2015  . Supervision of other high-risk pregnancy 07/17/2015  . Chronic hypertension during pregnancy, antepartum 07/17/2015  . Maternal morbid obesity, antepartum ([place]) 07/17/2015  . Smoker 07/17/2015  . Morbid obesity with BMI of 50.0-59.9, adult ([place]) 12/25/2014  . Thoracic spine fracture ([place]) 04/13/2012    PLAN: 1. Procardia 20 now , repeat 10 in 30 mins 2  If contractions continue, switch to Mag Sulfate.  [place] Swoboda V 12/01/2015,4:57 AM  Patient has responded nicely to Procardia with resolution of contractions within 45 minutes of initiation of Procardia. Will begin Procardia XL 30 twice a day

## 2015-12-02 DIAGNOSIS — N930 Postcoital and contact bleeding: Secondary | ICD-10-CM

## 2015-12-02 DIAGNOSIS — Z3A28 28 weeks gestation of pregnancy: Secondary | ICD-10-CM

## 2015-12-02 DIAGNOSIS — O26893 Other specified pregnancy related conditions, third trimester: Principal | ICD-10-CM

## 2015-12-02 LAB — GLUCOSE, CAPILLARY: Glucose-Capillary: 71 mg/dL (ref 65–99)

## 2015-12-02 MED ORDER — NIFEDIPINE ER 30 MG PO TB24: 30.0000 mg | ORAL_TABLET | Freq: Two times a day (BID) | ORAL | Status: DC

## 2015-12-02 NOTE — Discharge Summary (Signed)
OB Discharge Summary     Patient Name: Heidi Landry DOB: 07-14-1986 MRN: [id number]  Date of admission: 11/30/2015 Delivering MD: This patient has no babies on file.  Date of discharge: 12/02/2015  Admitting diagnosis: 29 yo G1P0 female at  82 weeks with pot coital bleeding    Secondary diagnosis: RESOLVED placenta previa (see MFM scan from this hospitalization)    Additional problems: Morbid obesity, CHTN, GDM     Discharge diagnosis: Post coital bleeding                               Hospital course:  Pt was admitted on 11/30/15 with 4-5 inch blood stain in her underwear after intercourse.  Pt was thought to have placental previa.  Pt did not bleed during her hospital course.  Repeat US showed posterior placenta with no previa and cervical length >3 cm.. Pt had some contractions that resovled with procardia.  Pt also received course of betamethasone.  CBG stayed in control with diet and medications.  NST reactive on day of discharge.  Pt to see Dr. Elonda Husky next week and determine if procardia is still needed. PTL precautions given.    Physical exam  Filed Vitals:   12/01/15 1734 12/01/15 2058 12/02/15 0638 12/02/15 0729  BP: 143/72 142/69 129/69 141/86  Pulse: 85 80 98 90  Temp: 98.8 F (37.1 C) 98.4 F (36.9 C) 98 F (36.7 C) 97.9 F (36.6 C)  TempSrc: Oral Oral  Oral  Resp: 18 18 20 18   Height:      Weight:      SpO2:       General: alert, cooperative and no distress GU:  No bleeding Uterine Fundus: gravid, non tender DVT Evaluation: No evidence of DVT seen on physical exam. Negative Homan's sign. Labs: Lab Results  Component Value Date   WBC 12.6* 11/30/2015   HGB 12.8 11/30/2015   HCT 38.0 11/30/2015   MCV 87.0 11/30/2015   PLT 148* 11/30/2015   CMP Latest Ref Rng 02/26/2015  Glucose 70 - 99 mg/dL 96  BUN 6 - 23 mg/dL 9  Creatinine 0.50 - 1.10 mg/dL 0.68  Sodium 135 - 145 mmol/L 138  Potassium 3.5 - 5.1 mmol/L 4.6  Chloride 96 - 112 mmol/L 103  CO2 19 -  32 mmol/L 30  Calcium 8.4 - 10.5 mg/dL 8.8  Total Protein 6.0 - 8.3 g/dL -  Total Bilirubin 0.3 - 1.2 mg/dL -  Alkaline Phos 39 - 117 U/L -  AST 0 - 37 U/L -  ALT 0 - 35 U/L -    Discharge instruction: per After Visit Summary and "Baby and Me Booklet".  After visit meds:    Medication List    STOP taking these medications        Docosahexaenoic Acid 300 MG Caps     MONISTAT 7 COMBO PACK APP VA     podofilox 0.5 % gel  Commonly known as:  CONDYLOX     trichloroacetic acid 80 % Liqd      TAKE these medications        acetaminophen 325 MG suppository  Commonly known as:  TYLENOL  Place 325 mg rectally every 4 (four) hours as needed.     aspirin 81 MG tablet  Take 81 mg by mouth daily.     cetirizine 10 MG tablet  Commonly known as:  ZYRTEC  Take 10 mg by mouth daily.  glyBURIDE 2.5 MG tablet  Commonly known as:  DIABETA  Take 1 tablet at bedtime     labetalol 200 MG tablet  Commonly known as:  NORMODYNE  Take 100 mg by mouth daily.     NIFEdipine 30 MG 24 hr tablet  Commonly known as:  PROCARDIA-XL/ADALAT CC  Take 1 tablet (30 mg total) by mouth 2 (two) times daily.     omeprazole 20 MG capsule  Commonly known as:  PRILOSEC  Take 1 capsule (20 mg total) by mouth daily. 1 tablet a day     prenatal vitamin w/FE, FA 27-1 MG Tabs tablet  Take 1 tablet by mouth daily at 12 noon.        Diet: carb modified diet  Activity: Advance as tolerated.  Outpatient follow DD:[facility] appt with dr. Elonda Husky this week. Follow up Appt:Future Appointments Date Time Provider [place]  12/04/2015 9:00 AM [facility]  12/04/2015 9:30 AM Florian Buff, MD [facility]    12/02/2015 Guss Bunde., MD

## 2015-12-02 NOTE — Progress Notes (Signed)
Patient discharged per wheelchair. Teachback provided with patient stating an understanding.

## 2015-12-02 NOTE — Discharge Instructions (Signed)
Vaginal Rest until exam by Dr. Elonda Husky next week. Continue Procardia until appointment with Dr. Elonda Husky.

## 2015-12-03 ENCOUNTER — Ambulatory Visit (INDEPENDENT_AMBULATORY_CARE_PROVIDER_SITE_OTHER): Payer: Medicaid Other | Admitting: Obstetrics & Gynecology

## 2015-12-03 ENCOUNTER — Encounter: Payer: Self-pay | Admitting: Obstetrics & Gynecology

## 2015-12-03 VITALS — BP 160/90 | HR 92 | Wt 320.0 lb

## 2015-12-03 DIAGNOSIS — O10913 Unspecified pre-existing hypertension complicating pregnancy, third trimester: Secondary | ICD-10-CM

## 2015-12-03 DIAGNOSIS — O24419 Gestational diabetes mellitus in pregnancy, unspecified control: Secondary | ICD-10-CM

## 2015-12-03 DIAGNOSIS — Z1389 Encounter for screening for other disorder: Secondary | ICD-10-CM | POA: Diagnosis not present

## 2015-12-03 DIAGNOSIS — O0993 Supervision of high risk pregnancy, unspecified, third trimester: Secondary | ICD-10-CM | POA: Diagnosis not present

## 2015-12-03 DIAGNOSIS — O4693 Antepartum hemorrhage, unspecified, third trimester: Secondary | ICD-10-CM | POA: Diagnosis not present

## 2015-12-03 DIAGNOSIS — Z331 Pregnant state, incidental: Secondary | ICD-10-CM | POA: Diagnosis not present

## 2015-12-03 LAB — POCT URINALYSIS DIPSTICK
GLUCOSE UA: NEGATIVE
Ketones, UA: NEGATIVE
NITRITE UA: NEGATIVE
Protein, UA: NEGATIVE
RBC UA: 3

## 2015-12-03 NOTE — Progress Notes (Signed)
Fetal Surveillance Testing today:  FHR   High Risk Pregnancy Diagnosis(es):   Chronic hypertension, Class A2 DM, third trimester bleeding  G1P0 51w5dEstimated Date of Delivery: 02/20/16  Blood pressure 160/90, pulse 92, weight 320 lb (145.151 kg), last menstrual period 05/16/2015.  Urinalysis: Negative   HPI: The patient is being seen today for ongoing management of as above. Today she reports frequent cramping as in the hospital   BP weight and urine results all reviewed and noted. Patient reports good fetal movement, denies any bleeding and no rupture of membranes symptoms or regular contractions.  Fundal Height:  u+22 Fetal Heart rate:  155 Edema:  2+  Patient is without complaints other than noted in her HPI. All questions were answered.  All lab and sonogram results have been reviewed. Comments:    Assessment:  1.  Pregnancy at 276w5d Estimated Date of Delivery: 02/20/16 :                          2.  Chronic hypertension                        3.  Class A2 DM                        4.  Morbid obesity                         5.  Third trimester bleeding, resolved  Medication(s) Plans:  Continue current meds  Treatment Plan:  At 32 weeks twice weekly testing  No Follow-up on file. for appointment for high risk OB care  No orders of the defined types were placed in this encounter.   Orders Placed This Encounter  Procedures  . POCT urinalysis dipstick

## 2015-12-04 ENCOUNTER — Other Ambulatory Visit: Payer: Medicaid Other

## 2015-12-04 ENCOUNTER — Encounter: Payer: Medicaid Other | Admitting: Obstetrics & Gynecology

## 2015-12-06 ENCOUNTER — Inpatient Hospital Stay (HOSPITAL_COMMUNITY): Payer: Medicaid Other

## 2015-12-06 ENCOUNTER — Inpatient Hospital Stay (HOSPITAL_COMMUNITY)
Admission: AD | Admit: 2015-12-06 | Discharge: 2015-12-10 | DRG: 765 | Disposition: A | Discharge status: Home / Self Care | Payer: Medicaid Other | Source: Ambulatory Visit | Attending: Obstetrics and Gynecology | Admitting: Obstetrics and Gynecology

## 2015-12-06 ENCOUNTER — Encounter (HOSPITAL_COMMUNITY): Payer: Self-pay | Admitting: *Deleted

## 2015-12-06 DIAGNOSIS — Z6841 Body Mass Index (BMI) 40.0 and over, adult: Secondary | ICD-10-CM | POA: Diagnosis not present

## 2015-12-06 DIAGNOSIS — O09893 Supervision of other high risk pregnancies, third trimester: Secondary | ICD-10-CM

## 2015-12-06 DIAGNOSIS — O4592 Premature separation of placenta, unspecified, second trimester: Secondary | ICD-10-CM

## 2015-12-06 DIAGNOSIS — Z7984 Long term (current) use of oral hypoglycemic drugs: Secondary | ICD-10-CM | POA: Diagnosis not present

## 2015-12-06 DIAGNOSIS — O1092 Unspecified pre-existing hypertension complicating childbirth: Secondary | ICD-10-CM | POA: Diagnosis present

## 2015-12-06 DIAGNOSIS — F17210 Nicotine dependence, cigarettes, uncomplicated: Secondary | ICD-10-CM | POA: Diagnosis present

## 2015-12-06 DIAGNOSIS — O24410 Gestational diabetes mellitus in pregnancy, diet controlled: Secondary | ICD-10-CM

## 2015-12-06 DIAGNOSIS — Z3A29 29 weeks gestation of pregnancy: Secondary | ICD-10-CM | POA: Diagnosis not present

## 2015-12-06 DIAGNOSIS — O99333 Smoking (tobacco) complicating pregnancy, third trimester: Secondary | ICD-10-CM

## 2015-12-06 DIAGNOSIS — O4593 Premature separation of placenta, unspecified, third trimester: Secondary | ICD-10-CM | POA: Diagnosis present

## 2015-12-06 DIAGNOSIS — O24415 Gestational diabetes mellitus in pregnancy, controlled by oral hypoglycemic drugs: Secondary | ICD-10-CM | POA: Diagnosis present

## 2015-12-06 DIAGNOSIS — O99213 Obesity complicating pregnancy, third trimester: Secondary | ICD-10-CM

## 2015-12-06 DIAGNOSIS — O10013 Pre-existing essential hypertension complicating pregnancy, third trimester: Secondary | ICD-10-CM

## 2015-12-06 DIAGNOSIS — O322XX0 Maternal care for transverse and oblique lie, not applicable or unspecified: Secondary | ICD-10-CM | POA: Diagnosis present

## 2015-12-06 DIAGNOSIS — O34219 Maternal care for unspecified type scar from previous cesarean delivery: Secondary | ICD-10-CM

## 2015-12-06 DIAGNOSIS — O99210 Obesity complicating pregnancy, unspecified trimester: Secondary | ICD-10-CM

## 2015-12-06 DIAGNOSIS — O321XX0 Maternal care for breech presentation, not applicable or unspecified: Secondary | ICD-10-CM | POA: Diagnosis present

## 2015-12-06 DIAGNOSIS — O09899 Supervision of other high risk pregnancies, unspecified trimester: Secondary | ICD-10-CM

## 2015-12-06 DIAGNOSIS — O4693 Antepartum hemorrhage, unspecified, third trimester: Secondary | ICD-10-CM

## 2015-12-06 DIAGNOSIS — O99334 Smoking (tobacco) complicating childbirth: Secondary | ICD-10-CM | POA: Diagnosis present

## 2015-12-06 DIAGNOSIS — O99214 Obesity complicating childbirth: Secondary | ICD-10-CM | POA: Diagnosis present

## 2015-12-06 DIAGNOSIS — O10919 Unspecified pre-existing hypertension complicating pregnancy, unspecified trimester: Secondary | ICD-10-CM | POA: Diagnosis present

## 2015-12-06 LAB — URINALYSIS, ROUTINE W REFLEX MICROSCOPIC
BILIRUBIN URINE: NEGATIVE
GLUCOSE, UA: NEGATIVE mg/dL
Ketones, ur: NEGATIVE mg/dL
Nitrite: NEGATIVE
Protein, ur: NEGATIVE mg/dL
SPECIFIC GRAVITY, URINE: 1.01 (ref 1.005–1.030)
pH: 6 (ref 5.0–8.0)

## 2015-12-06 LAB — CBC
HCT: 38.3 % (ref 36.0–46.0)
HEMATOCRIT: 36.1 % (ref 36.0–46.0)
Hemoglobin: 12.7 g/dL (ref 12.0–15.0)
Hemoglobin: 11.8 g/dL — ABNORMAL LOW (ref 12.0–15.0)
MCH: 28.3 pg (ref 26.0–34.0)
MCH: 28 pg (ref 26.0–34.0)
MCHC: 33.2 g/dL (ref 30.0–36.0)
MCHC: 32.7 g/dL (ref 30.0–36.0)
MCV: 85.5 fL (ref 78.0–100.0)
MCV: 85.5 fL (ref 78.0–100.0)
PLATELETS: 165 10*3/uL (ref 150–400)
Platelets: 168 10*3/uL (ref 150–400)
RBC: 4.48 MIL/uL (ref 3.87–5.11)
RBC: 4.22 MIL/uL (ref 3.87–5.11)
RDW: 14.3 % (ref 11.5–15.5)
RDW: 14.4 % (ref 11.5–15.5)
WBC: 18.8 10*3/uL — AB (ref 4.0–10.5)
WBC: 20 10*3/uL — AB (ref 4.0–10.5)

## 2015-12-06 LAB — COMPREHENSIVE METABOLIC PANEL
ALK PHOS: 100 U/L (ref 38–126)
ALT: 18 U/L (ref 14–54)
ANION GAP: 10 (ref 5–15)
AST: 15 U/L (ref 15–41)
Albumin: 3 g/dL — ABNORMAL LOW (ref 3.5–5.0)
BILIRUBIN TOTAL: 0.8 mg/dL (ref 0.3–1.2)
BUN: 5 mg/dL — ABNORMAL LOW (ref 6–20)
CALCIUM: 8.1 mg/dL — AB (ref 8.9–10.3)
CO2: 25 mmol/L (ref 22–32)
CREATININE: 0.45 mg/dL (ref 0.44–1.00)
Chloride: 100 mmol/L — ABNORMAL LOW (ref 101–111)
Glucose, Bld: 98 mg/dL (ref 65–99)
Potassium: 3.8 mmol/L (ref 3.5–5.1)
Sodium: 135 mmol/L (ref 135–145)
TOTAL PROTEIN: 7 g/dL (ref 6.5–8.1)

## 2015-12-06 LAB — URINE MICROSCOPIC-ADD ON: RBC / HPF: NONE SEEN RBC/hpf (ref 0–5)

## 2015-12-06 LAB — GLUCOSE, CAPILLARY
GLUCOSE-CAPILLARY: 107 mg/dL — AB (ref 65–99)
GLUCOSE-CAPILLARY: 97 mg/dL (ref 65–99)
Glucose-Capillary: 89 mg/dL (ref 65–99)

## 2015-12-06 LAB — PREPARE RBC (CROSSMATCH)

## 2015-12-06 MED ORDER — DOCUSATE SODIUM 100 MG PO CAPS
100.0000 mg | ORAL_CAPSULE | Freq: Every day | ORAL | Status: DC
  Administered 2015-12-06 – 2015-12-10 (×3): 100 mg via ORAL
  Filled 2015-12-06 (×6): qty 1

## 2015-12-06 MED ORDER — GLYBURIDE 2.5 MG PO TABS
2.5000 mg | ORAL_TABLET | Freq: Every day | ORAL | Status: DC
Start: 2015-12-06 — End: 2015-12-08
  Administered 2015-12-06 – 2015-12-07 (×2): 2.5 mg via ORAL
  Filled 2015-12-06 (×3): qty 1

## 2015-12-06 MED ORDER — ACETAMINOPHEN 325 MG PO TABS
650.0000 mg | ORAL_TABLET | ORAL | Status: DC | PRN
  Administered 2015-12-06 – 2015-12-07 (×2): 650 mg via ORAL
  Filled 2015-12-06 (×2): qty 2

## 2015-12-06 MED ORDER — LABETALOL HCL 100 MG PO TABS
100.0000 mg | ORAL_TABLET | Freq: Every day | ORAL | Status: DC
Start: 2015-12-06 — End: 2015-12-08
  Administered 2015-12-06 – 2015-12-07 (×2): 100 mg via ORAL
  Filled 2015-12-06 (×4): qty 1

## 2015-12-06 MED ORDER — LACTATED RINGERS IV SOLN
INTRAVENOUS | Status: DC
  Administered 2015-12-06 – 2015-12-08 (×6): via INTRAVENOUS

## 2015-12-06 MED ORDER — SODIUM CHLORIDE 0.9 % IV SOLN: Freq: Once | INTRAVENOUS | Status: DC

## 2015-12-06 MED ORDER — CITRIC ACID-SODIUM CITRATE 334-500 MG/5ML PO SOLN
ORAL | Status: AC
  Filled 2015-12-06: qty 15

## 2015-12-06 MED ORDER — PANTOPRAZOLE SODIUM 40 MG PO TBEC
40.0000 mg | DELAYED_RELEASE_TABLET | Freq: Every day | ORAL | Status: DC
  Administered 2015-12-06 – 2015-12-10 (×4): 40 mg via ORAL
  Filled 2015-12-06 (×6): qty 1

## 2015-12-06 MED ORDER — PRENATAL MULTIVITAMIN CH
1.0000 | ORAL_TABLET | Freq: Every day | ORAL | Status: DC
  Administered 2015-12-06 – 2015-12-07 (×2): 1 via ORAL
  Filled 2015-12-06 (×5): qty 1

## 2015-12-06 MED ORDER — DEXTROSE 5 % IV SOLN
5.0000 10*6.[IU] | Freq: Once | INTRAVENOUS | Status: AC
  Administered 2015-12-06: 5 10*6.[IU] via INTRAVENOUS
  Filled 2015-12-06: qty 5

## 2015-12-06 MED ORDER — PENICILLIN G POTASSIUM 5000000 UNITS IJ SOLR
2.5000 10*6.[IU] | INTRAVENOUS | Status: DC
  Administered 2015-12-06 – 2015-12-08 (×11): 2.5 10*6.[IU] via INTRAVENOUS
  Filled 2015-12-06 (×13): qty 2.5

## 2015-12-06 MED ORDER — CALCIUM CARBONATE ANTACID 500 MG PO CHEW
2.0000 | CHEWABLE_TABLET | ORAL | Status: DC | PRN
  Administered 2015-12-08: 400 mg via ORAL
  Filled 2015-12-06 (×3): qty 2

## 2015-12-06 MED ORDER — MAGNESIUM SULFATE BOLUS VIA INFUSION
6.0000 g | Freq: Once | INTRAVENOUS | Status: DC
  Filled 2015-12-06: qty 500

## 2015-12-06 MED ORDER — MAGNESIUM SULFATE 50 % IJ SOLN
2.0000 g/h | INTRAVENOUS | Status: AC
  Administered 2015-12-06: 6 g/h via INTRAVENOUS
  Filled 2015-12-06: qty 80

## 2015-12-06 MED ORDER — LACTATED RINGERS IV BOLUS (SEPSIS)
1000.0000 mL | Freq: Once | INTRAVENOUS | Status: AC
  Administered 2015-12-06 (×2): 1000 mL via INTRAVENOUS

## 2015-12-06 MED ORDER — DOCUSATE SODIUM 100 MG PO CAPS
100.0000 mg | ORAL_CAPSULE | Freq: Every day | ORAL | Status: DC
  Filled 2015-12-06 (×3): qty 1

## 2015-12-06 MED ORDER — ZOLPIDEM TARTRATE 5 MG PO TABS
5.0000 mg | ORAL_TABLET | Freq: Every evening | ORAL | Status: DC | PRN
  Administered 2015-12-07 (×2): 5 mg via ORAL
  Filled 2015-12-06 (×2): qty 1

## 2015-12-06 NOTE — Progress Notes (Signed)
ANTEPARTUM PROGRESS NOTE  Heidi Landry is a 29 y.o. G1P0 at [redacted]w[redacted]d admitted for abruption.  Subjective: Significant vaginal bleeding on arrival to floor.  Objective: BP 125/65 mmHg  Pulse 112  Temp(Src) 98.6 F (37 C) (Oral)  Resp 18  Ht 5' 2"  (1.575 m)  Wt 325 lb 3.2 oz (147.51 kg)  BMI 59.46 kg/m2  LMP 05/16/2015 (Exact Date) or  Filed Vitals:   12/06/15 0727  BP: 125/65  Pulse: 112  Temp: 98.6 F (37 C)  TempSrc: Oral  Resp: 18  Height: 5' 2"  (1.575 m)  Weight: 325 lb 3.2 oz (147.51 kg)  FHT 140/mod/-a/-d Transverse on bedside u/s   Labs: Lab Results  Component Value Date   WBC 18.8* 12/06/2015   HGB 12.7 12/06/2015   HCT 38.3 12/06/2015   MCV 85.5 12/06/2015   PLT 165 12/06/2015    Patient Active Problem List   Diagnosis Date Noted  . Placental abruption 12/06/2015  . Vaginal bleeding in pregnancy 11/30/2015  . Chronic hypertension complicating or reason for care during pregnancy 08/21/2015  . Gestational diabetes mellitus, class A1/B 08/08/2015  . Rubella non-immune status, antepartum 07/18/2015  . Supervision of other high-risk pregnancy 07/17/2015  . Chronic hypertension during pregnancy, antepartum 07/17/2015  . Maternal morbid obesity, antepartum ([place] 07/17/2015  . Smoker 07/17/2015  . Morbid obesity with BMI of 50.0-59.9, adult ([place] 12/25/2014  . Thoracic spine fracture ([place] 04/13/2012    Assessment / Plan: 29y.o. G1P0 at 245w1dere for abruption. CHTn, A2/BDM, smoker, BMI 60. Now with significant vaginal bleeding. FHT is category 1. Will monitor CBC and hemodynamic status. Will continue to monitor fetal status and if remains reassuring mgmt will be expectant. Transverse lie, cervix 1 cm dilated on admission, so would need c/s in setting on NRFHTs. Mg for neuroprotection and penicillin for GBS unknown status have been started. Is s/p bmz x2 earlier this month so no indication for repeat. Will repeat CBC this PM.  NoDesma MaximMD 12/06/2015,  10:42 AM

## 2015-12-06 NOTE — MAU Note (Signed)
Report given to [employer] assignment of 160 received.

## 2015-12-06 NOTE — Progress Notes (Signed)
Dr Annia Friendly updated on patient status, FHR tracing, UC freq with palpation and per patient reporting of no UCs felt since starting Magnesium, Total for EBL of VB since admission with current amt of active bleeding, pt pain level, and previous MFM [place] report and information regarding previous admission. Provider to perform SVE. Meda Klinefelter, RNC at bedside to assist,

## 2015-12-06 NOTE — H&P (Signed)
Maragret Vanacker is a 29 y.o. G1P0 female at [place] 12w1ddetermined by LMP who is a current smoker and has a pregnancy complicated by obesity, chronic hypertension, gestational DM and posterior placenta w/ suspected placenta previa presenting for 6 hours of painless vaginal bleeding. Patient first noticed pink, thick mucous while wiping when she used the restroom at 6:00am. Since, she has noticed pink, thick mucous every time she has wiped. Bleeding was only observed when wiping while using the restroom. Pt denies the need to use a pad due to minimal amount of discharge. She went immediately to the MAU, where RN noticed several drops of bright red blood. A sterile speculum and cervical exam was performed by Dr. WSi Raiderin the MAU, which incited several bloody clots to be discharged. Patient denies fevers/chills, dysuria, frequency, hematuria, flank pain, vaginal odor or abnormal vaginal discharge. She reports having 5 contractions since waking up this morning and reports good fetal movement.  She has been spontanously urinating adequately with normal BM. She has been taking all of her meds, including procardia XL 35mBID consistently. She was teary during the interview and was very worried about baby's health.   [facility]. Transvaginal U/S was taken on 12/02 which normal with cervical length >3cm and posterior placenta without evidence of previa. Nonstress fetal BPP was 6/8 with -2 for fetal breathing. She received 2 doses of betamethazone 1232mn 12/03-04 and was started on procardia.  She was discharged on 12/04 with resolved bleeding and confirmed closed cervix on PE. She resumed her current medications of labetalol, glyburide, aspirin and protonix on discharge and received a new prescription for Procardia XL 73m67mD. [facility] EureElonda Husky12/05 and was reassuring.    Maternal Medical History:  Reason for  admission: Vaginal bleeding.   Contractions: Onset was 3-5 hours ago.   Frequency: irregular.   Perceived severity is mild.    Fetal activity: Perceived fetal activity is normal.   Last perceived fetal movement was within the past hour.    Prenatal Complications - Diabetes: gestational. Diabetes is managed by oral agent (monotherapy).       OB History    Gravida Para Term Preterm AB TAB SAB Ectopic Multiple Living   1         0     Past Medical History  Diagnosis Date  . Hypertension   . Edema   . Chronic back pain   . Tendonitis   . IBS (irritable bowel syndrome)   . Degenerative disc disease   . Spondylosis   . Gestational diabetes mellitus, antepartum    Past Surgical History  Procedure Laterality Date  . Cholecystectomy     Family History: family history includes Brain cancer in her paternal uncle; Cancer in her father and paternal uncle; Diabetes in her maternal grandmother; Fibromyalgia in her mother; Hernia in her father; Hypertension in her maternal grandmother; Throat cancer in her paternal uncle; Thyroid disease in her paternal aunt. Social History:  reports that she has been smoking Cigarettes.  She started smoking about 9 years ago. She has a 9 pack-year smoking history. She has never used smokeless tobacco. She reports that she does not drink alcohol or use illicit drugs.   Current facility-administered medications:  .  acetaminophen (TYLENOL) tablet 650 mg, 650 mg, Oral, Q4H PRN, KimbCaren Macadam .  calcium carbonate (TUMS - dosed in mg  elemental calcium) chewable tablet 400 mg of elemental calcium, 2 tablet, Oral, Q4H PRN, Caren Macadam, MD .  docusate sodium (COLACE) capsule 100 mg, 100 mg, Oral, Daily, Caren Macadam, MD .  docusate sodium (COLACE) capsule 100 mg, 100 mg, Oral, Daily, Caren Macadam, MD .  glyBURIDE (DIABETA) tablet 2.5 mg, 2.5 mg, Oral, QHS, Caren Macadam, MD .  labetalol (NORMODYNE) tablet 100 mg,  100 mg, Oral, Daily, Caren Macadam, MD .  magnesium bolus via infusion 6 g, 6 g, Intravenous, Once, Caren Macadam, MD .  magnesium sulfate 40 g in lactated ringers 500 mL (0.08 g/mL) OB infusion, 2 g/hr, Intravenous, Titrated, Caren Macadam, MD .  pantoprazole (PROTONIX) EC tablet 40 mg, 40 mg, Oral, Daily, Caren Macadam, MD .  prenatal multivitamin tablet 1 tablet, 1 tablet, Oral, Q1200, Caren Macadam, MD .  zolpidem (AMBIEN) tablet 5 mg, 5 mg, Oral, QHS PRN, Caren Macadam, MD   Prenatal Transfer Tool  Maternal Diabetes: Yes:  Diabetes Type: Gestational DM, glyburide Genetic Screening: Abnormal:  Results: Elevated AFP Maternal Ultrasounds/Referrals: [place] significant for possible partial previa, resolved on 12/4 Fetal Ultrasounds or other Referrals: None Maternal Substance Abuse: No Significant Maternal Medications: Meds include: Procardia 36m BID (Started 12/03), protonix, ASA, glyburide, labetalol Significant Maternal Lab Results: RPR index <0.9 (06/2015), AFP 83.4 (H) Other Comments: Quad screen results pending; GBS unknown  Review of Systems  Constitutional: Negative for fever, chills and malaise/fatigue.  Genitourinary: Negative for dysuria, urgency, frequency, hematuria and flank pain.       Positive for mild suprapubic tenderness and fullness per pt  Neurological: Negative for dizziness, tingling and headaches.   Obective: Blood pressure 125/65, pulse 112, temperature 98.6 F (37 C), temperature source Oral, resp. rate 18, height 5' 2"  (1.575 m), weight 147.51 kg (325 lb 3.2 oz), last menstrual period 05/16/2015.  Body mass index is 59.46 kg/(m^2).   Exam Physical Exam  Constitutional:  Ms AOuida Sillswas pleasant, in no distress but worried on exam. She was in good color, well hydrated.  GI:  Gravida, otherwise soft, nontender  Genitourinary:  Cervix was 1+, thick and high Uterus posterior  Skin:  No edema on bilateral LE      Prenatal labs: ABO, Rh: --/--/O POS, O POS (12/02 0800) Antibody: NEG (12/02 0800) Rubella: <0.90 (07/19 1153) RPR: Non Reactive (07/19 1153)  HBsAg: Negative (07/19 1153)  HIV: Non Reactive (07/19 1153)  GBS:   UNKNOWN  Assessment/Plan: Ms. AOuida Sillsis a G1P0 with GA 275w1determined by LMP who was admitted to the antenatal unit for partial placental abruption during third trimester of pregnancy. She was stable on admission with cervix 1+, thick and high per sterile PE by Dr. WoSi RaiderBleeding appears to be contained as for now after pt passed several clots after PE.   Patient Active Problem List   Diagnosis Date Noted  . Placental abruption 12/06/2015  . Vaginal bleeding in pregnancy 11/30/2015  . Chronic hypertension complicating or reason for care during pregnancy 08/21/2015  . Gestational diabetes mellitus, class A1/B 08/08/2015  . Rubella non-immune status, antepartum 07/18/2015  . Supervision of other high-risk pregnancy 07/17/2015  . Chronic hypertension during pregnancy, antepartum 07/17/2015  . Maternal morbid obesity, antepartum (HCDubberly07/19/2016  . Smoker 07/17/2015  . Morbid obesity with BMI of 50.0-59.9, adult (HCDawson12/28/2015  . Thoracic spine fracture ([place]04/16/2013   1. Placental Abruption, clinically suspicious - Order repeat transvaginal U/S  - Administer Magnesium  with 6g bolus and 2g/hr for 7m in LR  - Administer 5 million units pencillin G IV x1, with 2.5 million units penicillin G q4h. - Bedrest - Routine vitals  2. Gestational diabetes mellitus - Continue glyburide 2.5 mg qhs - Fasting CBG q24 before breakfast - 2 hour post-prandial CBG q24 after dinner  3. HTN - Continue labetalol 1011mdaily    Thanh-TamT  Le 12/06/2015, 10:05 AM  Agree with the assessment and plan formulated above. Briefly, Ms. Anderson presented to the MAU with increasingly worsening vaginal bleeding and regular contractions q5-50m41m She was admitted recently for similar  and was thought to have a placenta previa, but this was ruled out with imaging at that time. She was discharged when her bleeding resolved. However, patient is now experiencing increasing bright red blood, so she was admitted for clinically suspicious placental abruption. As patient was being wheeled to her room in Antepartum, she began to bleed much more heavily with trickling blood and large golfball sized clots. Baby's lie was found to be transverse on US [place] FHT remained reassuring. On exam, the patient's cervix was closed and she was mildly tender suprapubically. For now, we will keep her on bedrest with continuous fetal monitoring. She has already received BMZ x 2 from previous admission, so we will start her on Mag bolus and 2g/hr following this as well as PCN, given that she is possibly in early labor. 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 These will be continued and she will have her sugars checked qAM fasting and 2 hours post prandial. Should baby's FHT worsen or mom start to dilate her cervix, she will be prepped for C/S given baby's transverse lie. NICU has been made aware of this patient.   AmaStormy CardD 12/06/2015, 12:02 PM  OB FELLOW HISTORY AND PHYSICAL ATTESTATION  I have seen and examined this patient; I agree with above documentation in the resident's note. Abruption, significant. FHTs reassuring so holding on section. Transverse lie currently. Will monitor BP for chtn and monitor glucose for a2/bdm and continue home glyburide. S/p bmz x2 last week so no indication currently. Given cervical change will start magnesium and penicillin and reassess later today. F/u u/s   NoaDesma Maxim/07/2015, 12:44 PM

## 2015-12-06 NOTE — Progress Notes (Signed)
Dr Elly Modena notified of patient status including: Totals for updated EBL at 475cc, FHR tracing, and UC frequency. Discussed POC utilizing Magnesium Sulfate in active bleeding VS. Benefits of CP prophylaxis. Will plan to initiate Magnesium.

## 2015-12-06 NOTE — Progress Notes (Signed)
Patient arrived to BS rm 160 in wheelchair. Patient went to stand to go to bathroom and reported significant leaking from vagina. Assessed multiple clots and large amount of bright red bleeding noticed, EBL 500cc. Quickly encouraged patient to bed for placement of FHR monitors and paged Dr Jabier Gauss for further evaluation. FHR on application 272.

## 2015-12-06 NOTE — MAU Note (Signed)
Notified Dr. Delena Bali patient presents with vaginal bleeding, recent hospitalization at [facility] for preterm labor and vaginal bleeding, MD to evaluate in room 7 in MAU

## 2015-12-06 NOTE — Progress Notes (Deleted)
Dr Elly Modena notified of pt status, updated totals at 475 cc for VB EBL, FHr tracing, and Ucs. Also, discussed POC utilizing Magnesium Sulfate in active bleeding Vs. CP prophylaxis. Will plan to initiate Magnesium.

## 2015-12-06 NOTE — MAU Note (Signed)
Pt reports she was released form the hospital on SUnday for contractions and bleeding. On procardia. No contractions until this morning. Saw some pink discharge this morning. Ctx q 5 min.

## 2015-12-06 NOTE — MAU Note (Signed)
Quarter size amount of bright red blood noted on pad, no signs of active bleeding from vagina.

## 2015-12-06 NOTE — Progress Notes (Cosign Needed)
AntePartum Progress Note Heidi Landry is a 29 y.o. G1P0 at 91w1dpresented for significant vaginal bleeding, found to have a placental abruption.   S: Per patient, she has been having contractions q2-3 minutes that are not painful. They improved earlier this afternoon, but returned again just now. She says the bleeding has improved significantly, though she has not stood up and gotten out of bed yet. She no longer has large clots, but a trickle when she shifts in the bed.   O:  BP 142/62 mmHg  Pulse 99  Temp(Src) 98.4 F (36.9 C) (Oral)  Resp 18  Ht 5' 2"  (1.575 m)  Wt 147.51 kg (325 lb 3.2 oz)  BMI 59.46 kg/m2  SpO2 95%  LMP 05/16/2015 (Exact Date) EFM: Reassuring, moderate variability with small variable decels.   CVE: Presentation: Transverse (per bedside [place] Exam by:: Dr WAnnia Friendly Cervical Exam deferred Pads have been counted and weighed - nurse estimates 75cc per hour.  Dark red blood on the pad that trickles with shifting in the bed.    A&P: 29y.o. G1P0 255w1ddmitted for placental abruption.  Continue with expectant management - continuous monitoring, Magnesium gtt, and PCN. Will do a cervical exam later this evening to assess for onset of labor. Will also check CBC to assess for blood loss anemia.   AmStormy CardMD 4:10 PM

## 2015-12-07 DIAGNOSIS — O4593 Premature separation of placenta, unspecified, third trimester: Secondary | ICD-10-CM | POA: Diagnosis present

## 2015-12-07 HISTORY — DX: Premature separation of placenta, unspecified, third trimester: O45.93

## 2015-12-07 LAB — RPR: RPR Ser Ql: NONREACTIVE

## 2015-12-07 LAB — CBC
HEMATOCRIT: 36.5 % (ref 36.0–46.0)
Hemoglobin: 11.7 g/dL — ABNORMAL LOW (ref 12.0–15.0)
MCH: 27.7 pg (ref 26.0–34.0)
MCHC: 32.1 g/dL (ref 30.0–36.0)
MCV: 86.5 fL (ref 78.0–100.0)
PLATELETS: 171 10*3/uL (ref 150–400)
RBC: 4.22 MIL/uL (ref 3.87–5.11)
RDW: 14.5 % (ref 11.5–15.5)
WBC: 18 10*3/uL — AB (ref 4.0–10.5)

## 2015-12-07 LAB — GLUCOSE, CAPILLARY
GLUCOSE-CAPILLARY: 104 mg/dL — AB (ref 65–99)
GLUCOSE-CAPILLARY: 108 mg/dL — AB (ref 65–99)
GLUCOSE-CAPILLARY: 82 mg/dL (ref 65–99)
Glucose-Capillary: 102 mg/dL — ABNORMAL HIGH (ref 65–99)
Glucose-Capillary: 76 mg/dL (ref 65–99)
Glucose-Capillary: 100 mg/dL — ABNORMAL HIGH (ref 65–99)

## 2015-12-07 LAB — CULTURE, OB URINE

## 2015-12-07 MED ORDER — FENTANYL CITRATE (PF) 100 MCG/2ML IJ SOLN
INTRAMUSCULAR | Status: AC
  Filled 2015-12-07: qty 2

## 2015-12-07 MED ORDER — FENTANYL CITRATE (PF) 100 MCG/2ML IJ SOLN
50.0000 ug | INTRAMUSCULAR | Status: DC | PRN
  Administered 2015-12-07 – 2015-12-08 (×5): 100 ug via INTRAVENOUS
  Filled 2015-12-07 (×5): qty 2

## 2015-12-07 MED ORDER — NIFEDIPINE ER 30 MG PO TB24
30.0000 mg | ORAL_TABLET | Freq: Two times a day (BID) | ORAL | Status: DC
  Filled 2015-12-07: qty 1

## 2015-12-07 MED ORDER — SIMETHICONE 80 MG PO CHEW
160.0000 mg | CHEWABLE_TABLET | Freq: Once | ORAL | Status: AC
Start: 2015-12-07 — End: 2015-12-07
  Administered 2015-12-07: 160 mg via ORAL
  Filled 2015-12-07: qty 2

## 2015-12-07 NOTE — Progress Notes (Signed)
[facility] OB/[facility]N Attending Note  Subjective:  Called to evaluate patient with increased frequency of contractions.  No increased bleeding.  Patient is very anxious.  Desires Procardia.  FHR reassuring, no contractions, no LOF or vaginal bleeding. Good FM.   Admitted on 12/06/2015 for Placental abruption in third trimester.   Objective:  Blood pressure 118/53, pulse 95, temperature 98.1 F (36.7 C), temperature source Oral, resp. rate 18, height 5' 2"  (1.575 m), weight 325 lb 3.2 oz (147.51 kg), last menstrual period 05/16/2015, SpO2 98 %. FHT  Baseline 130bpm, moderate variability, +accelerations, no decelerations Toco: reported q2-3 minutes, not able to be traced due to habitus Gen: NAD HENT: Normocephalic, atraumatic Lungs: Normal respiratory effort Heart: Regular rate noted Abdomen: NT, morbidly obese, gravid fundus, soft Cervix: 1.5/50/ballotable, unable to feel any presenting part, small amount of BRB in vault and on pad. Ext: 2+ DTRs, no edema, no cyanosis, negative Homan's sign  Assessment & Plan:  29 y.o. G1P0 at 74w2dadmitted for Placental abruption in third trimester, with increased contractions. No increased bleeding, Category I FHR tracing, no cervical change.  Patient reassured; emphasized that tocolysis is not usually recommended with a known abruption.  However, her pain can be treated and Fentanyl was ordered as needed for pain.  Will continue close observation.   UVerita Schneiders MD, FNew HamiltonAttending [place]uff[place] [facility]

## 2015-12-07 NOTE — Progress Notes (Signed)
Patient ID: Heidi Landry, female   DOB: [birth year], 29 y.o.   MRN: [id number]  [place] COMPREHENSIVE PROGRESS NOTE  Heidi Landry is a 29 y.o. G1P0 at 26w2dwho is admitted for bleeding secondary to abruption.  Estimated Date of Delivery: 02/20/16 Fetal presentation is Transverse.  Length of Stay:  1 Days. Admitted 12/06/2015  Subjective: States feels contractions every few minutes. Not tracing on monitor. Some pain with them. Good FM. No bleeding currently. Patient reports good fetal movement.  She reports frequent mild uterine contractions, no bleeding and no loss of fluid per vagina.  Vitals:  Blood pressure 118/53, pulse 95, temperature 98.1 F (36.7 C), temperature source Oral, resp. rate 18, height 5' 2"  (1.575 m), weight 147.51 kg (325 lb 3.2 oz), last menstrual period 05/16/2015, SpO2 98 %. Physical Examination: CONSTITUTIONAL: Well-developed, well-nourished female in no acute distress.  HENT:  Normocephalic, atraumatic, External right and left ear normal. Oropharynx is clear and moist EYES: Conjunctivae and EOM are normal. Pupils are equal, round, and reactive to light. No scleral icterus.  NECK: Normal range of motion, supple, no masses SKIN: Skin is warm and dry. No rash noted. Not diaphoretic. No erythema. No pallor. [place] Alert and oriented to person, place, and time. Normal reflexes, muscle tone coordination. No cranial nerve deficit noted. PSYCHIATRIC: Normal mood and affect. Normal behavior. Normal judgment and thought content. CARDIOVASCULAR: Normal heart rate noted, regular rhythm RESPIRATORY: Effort and breath sounds normal, no problems with respiration noted MUSCULOSKELETAL: Normal range of motion. No edema and no tenderness. 2+ distal pulses. ABDOMEN: Soft, nontender, nondistended, gravid. CERVIX: Dilation: 1.5 Effacement (%): 50 Station: -3 Presentation: Transverse Exam by:: Dr WAnnia Friendly Fetal monitoring: FHR: 1140 bpm, Variability:  moderate, Accelerations: Present, Decelerations: Absent  Uterine activity: multiple contractions per hour, not tracing  Results for orders placed or performed during the hospital encounter of 12/06/15 (from the past 48 hour(s))  Urinalysis, Routine w reflex microscopic ([facility]     Status: Abnormal   Collection Time: 12/06/15  7:30 AM  Result Value Ref Range   Color, Urine YELLOW YELLOW   APPearance CLEAR CLEAR   Specific Gravity, Urine 1.010 1.005 - 1.030   pH 6.0 5.0 - 8.0   Glucose, UA NEGATIVE NEGATIVE mg/dL   Hgb urine dipstick LARGE (A) NEGATIVE   Bilirubin Urine NEGATIVE NEGATIVE   Ketones, ur NEGATIVE NEGATIVE mg/dL   Protein, ur NEGATIVE NEGATIVE mg/dL   Nitrite NEGATIVE NEGATIVE   Leukocytes, UA MODERATE (A) NEGATIVE  Urine microscopic-add on     Status: Abnormal   Collection Time: 12/06/15  7:30 AM  Result Value Ref Range   Squamous Epithelial / LPF 0-5 (A) NONE SEEN   WBC, UA 6-30 0 - 5 WBC/hpf   RBC / HPF NONE SEEN 0 - 5 RBC/hpf   Bacteria, UA RARE (A) NONE SEEN  OB Urine Culture     Status: None   Collection Time: 12/06/15  7:30 AM  Result Value Ref Range   Specimen Description URINE, CLEAN CATCH    Special Requests NONE    Culture      MULTIPLE SPECIES PRESENT, SUGGEST RECOLLECTION NO GROUP B STREP (S.AGALACTIAE) ISOLATED [facility]   Report Status 12/07/2015 FINAL   Type and screen [place]    Status: None (Preliminary result)   Collection Time: 12/06/15 10:10 AM  Result Value Ref Range   ABO/RH(D) O POS    Antibody Screen NEG  Sample Expiration 12/09/2015    Unit Number [id number]    Blood Component Type RED CELLS,LR    Unit division 00    Status of Unit ALLOCATED    Transfusion Status OK TO TRANSFUSE    Crossmatch Result Compatible    Unit Number [id number]    Blood Component Type RED CELLS,LR    Unit division 00    Status of Unit ALLOCATED    Transfusion Status OK TO TRANSFUSE     Crossmatch Result Compatible   CBC     Status: Abnormal   Collection Time: 12/06/15 10:10 AM  Result Value Ref Range   WBC 18.8 (H) 4.0 - 10.5 K/uL   RBC 4.48 3.87 - 5.11 MIL/uL   Hemoglobin 12.7 12.0 - 15.0 g/dL   HCT 38.3 36.0 - 46.0 %   MCV 85.5 78.0 - 100.0 fL   MCH 28.3 26.0 - 34.0 pg   MCHC 33.2 30.0 - 36.0 g/dL   RDW 14.3 11.5 - 15.5 %   Platelets 165 150 - 400 K/uL  RPR     Status: None   Collection Time: 12/06/15 10:10 AM  Result Value Ref Range   RPR Ser Ql Non Reactive Non Reactive    Comment: (NOTE) Performed At: [facility] [address] [place], [place] [id number] Lindon Romp MD VO:[id number]   Glucose, capillary     Status: Abnormal   Collection Time: 12/06/15 12:05 PM  Result Value Ref Range   Glucose-Capillary 107 (H) 65 - 99 mg/dL  Glucose, capillary     Status: None   Collection Time: 12/06/15  4:25 PM  Result Value Ref Range   Glucose-Capillary 97 65 - 99 mg/dL  Prepare RBC     Status: None   Collection Time: 12/06/15  5:00 PM  Result Value Ref Range   Order Confirmation ORDER PROCESSED BY BLOOD BANK   CBC     Status: Abnormal   Collection Time: 12/06/15  6:16 PM  Result Value Ref Range   WBC 20.0 (H) 4.0 - 10.5 K/uL   RBC 4.22 3.87 - 5.11 MIL/uL   Hemoglobin 11.8 (L) 12.0 - 15.0 g/dL   HCT 36.1 36.0 - 46.0 %   MCV 85.5 78.0 - 100.0 fL   MCH 28.0 26.0 - 34.0 pg   MCHC 32.7 30.0 - 36.0 g/dL   RDW 14.4 11.5 - 15.5 %   Platelets 168 150 - 400 K/uL  Comprehensive metabolic panel     Status: Abnormal   Collection Time: 12/06/15  6:16 PM  Result Value Ref Range   Sodium 135 135 - 145 mmol/L   Potassium 3.8 3.5 - 5.1 mmol/L   Chloride 100 (L) 101 - 111 mmol/L   CO2 25 22 - 32 mmol/L   Glucose, Bld 98 65 - 99 mg/dL   BUN 5 (L) 6 - 20 mg/dL   Creatinine, Ser 0.45 0.44 - 1.00 mg/dL   Calcium 8.1 (L) 8.9 - 10.3 mg/dL   Total Protein 7.0 6.5 - 8.1 g/dL   Albumin 3.0 (L) 3.5 - 5.0 g/dL   AST 15 15 - 41 U/L   ALT 18 14 - 54 U/L    Alkaline Phosphatase 100 38 - 126 U/L   Total Bilirubin 0.8 0.3 - 1.2 mg/dL   GFR calc non Af Amer >60 >60 mL/min   GFR calc Af Amer >60 >60 mL/min    Comment: (NOTE) The eGFR has been calculated using the CKD EPI equation. This calculation has  not been validated in all clinical situations. eGFR's persistently <60 mL/min signify possible Chronic Kidney Disease.    Anion gap 10 5 - 15  Glucose, capillary     Status: None   Collection Time: 12/06/15 10:08 PM  Result Value Ref Range   Glucose-Capillary 89 65 - 99 mg/dL  Glucose, capillary     Status: Abnormal   Collection Time: 12/07/15  2:54 AM  Result Value Ref Range   Glucose-Capillary 102 (H) 65 - 99 mg/dL  CBC     Status: Abnormal   Collection Time: 12/07/15  5:25 AM  Result Value Ref Range   WBC 18.0 (H) 4.0 - 10.5 K/uL   RBC 4.22 3.87 - 5.11 MIL/uL   Hemoglobin 11.7 (L) 12.0 - 15.0 g/dL   HCT 36.5 36.0 - 46.0 %   MCV 86.5 78.0 - 100.0 fL   MCH 27.7 26.0 - 34.0 pg   MCHC 32.1 30.0 - 36.0 g/dL   RDW 14.5 11.5 - 15.5 %   Platelets 171 150 - 400 K/uL  Glucose, capillary     Status: None   Collection Time: 12/07/15  6:52 AM  Result Value Ref Range   Glucose-Capillary 76 65 - 99 mg/dL  Glucose, capillary     Status: Abnormal   Collection Time: 12/07/15 11:03 AM  Result Value Ref Range   Glucose-Capillary 104 (H) 65 - 99 mg/dL   Comment 1 Notify RN    Comment 2 Document in Chart     [place] Mfm Ob Limited  12/06/2015  OBSTETRICAL ULTRASOUND: This exam was performed within a [place] Ultrasound Department. The OB US report was generated in the AS system, and faxed to the ordering physician.  This report is available in the [employer]. See the AS Obstetric US report via the Image Link.   Current scheduled medications . sodium chloride   Intravenous Once  . docusate sodium  100 mg Oral Daily  . glyBURIDE  2.5 mg Oral QHS  . labetalol  100 mg Oral Daily  . magnesium  6 g Intravenous Once  . NIFEdipine  30 mg Oral Q12H  .  pantoprazole  40 mg Oral Daily  . pencillin G potassium IV  2.5 Million Units Intravenous Q4H  . prenatal multivitamin  1 tablet Oral Q1200    I have reviewed the patient's current medications.  ASSESSMENT: Active Problems:   Placental abruption   PLAN:  Monitor contractions Continue to observe Bleeding precautions, pad count  Continue routine antenatal care.   Verita Schneiders, MD, [place] Attending [place], [facility]

## 2015-12-08 ENCOUNTER — Encounter (HOSPITAL_COMMUNITY): Payer: Self-pay

## 2015-12-08 ENCOUNTER — Inpatient Hospital Stay (HOSPITAL_COMMUNITY): Payer: Medicaid Other | Admitting: Anesthesiology

## 2015-12-08 ENCOUNTER — Encounter (HOSPITAL_COMMUNITY)
Admission: AD | Disposition: A | Discharge status: Home / Self Care | Payer: Self-pay | Source: Ambulatory Visit | Attending: Obstetrics and Gynecology

## 2015-12-08 DIAGNOSIS — O99214 Obesity complicating childbirth: Secondary | ICD-10-CM

## 2015-12-08 DIAGNOSIS — O322XX0 Maternal care for transverse and oblique lie, not applicable or unspecified: Secondary | ICD-10-CM

## 2015-12-08 DIAGNOSIS — O24415 Gestational diabetes mellitus in pregnancy, controlled by oral hypoglycemic drugs: Secondary | ICD-10-CM

## 2015-12-08 DIAGNOSIS — O4593 Premature separation of placenta, unspecified, third trimester: Secondary | ICD-10-CM

## 2015-12-08 DIAGNOSIS — F17210 Nicotine dependence, cigarettes, uncomplicated: Secondary | ICD-10-CM

## 2015-12-08 DIAGNOSIS — Z3A29 29 weeks gestation of pregnancy: Secondary | ICD-10-CM

## 2015-12-08 DIAGNOSIS — O1092 Unspecified pre-existing hypertension complicating childbirth: Secondary | ICD-10-CM

## 2015-12-08 DIAGNOSIS — O321XX0 Maternal care for breech presentation, not applicable or unspecified: Secondary | ICD-10-CM

## 2015-12-08 LAB — GLUCOSE, CAPILLARY
GLUCOSE-CAPILLARY: 104 mg/dL — AB (ref 65–99)
Glucose-Capillary: 86 mg/dL (ref 65–99)
Glucose-Capillary: 78 mg/dL (ref 65–99)

## 2015-12-08 LAB — CBC
HCT: 36.9 % (ref 36.0–46.0)
Hemoglobin: 11.8 g/dL — ABNORMAL LOW (ref 12.0–15.0)
MCH: 28 pg (ref 26.0–34.0)
MCHC: 32 g/dL (ref 30.0–36.0)
MCV: 87.4 fL (ref 78.0–100.0)
PLATELETS: 180 10*3/uL (ref 150–400)
RBC: 4.22 MIL/uL (ref 3.87–5.11)
RDW: 14.3 % (ref 11.5–15.5)
WBC: 17.8 10*3/uL — ABNORMAL HIGH (ref 4.0–10.5)

## 2015-12-08 LAB — CULTURE, BETA STREP (GROUP B ONLY)

## 2015-12-08 SURGERY — Surgical Case
Anesthesia: Spinal

## 2015-12-08 MED ORDER — ZOLPIDEM TARTRATE 5 MG PO TABS: 5.0000 mg | ORAL_TABLET | Freq: Every evening | ORAL | Status: DC | PRN

## 2015-12-08 MED ORDER — SODIUM CHLORIDE 0.9 % IJ SOLN: 3.0000 mL | INTRAMUSCULAR | Status: DC | PRN

## 2015-12-08 MED ORDER — AMLODIPINE BESYLATE 10 MG PO TABS
10.0000 mg | ORAL_TABLET | Freq: Every day | ORAL | Status: DC
  Filled 2015-12-08 (×4): qty 1

## 2015-12-08 MED ORDER — KETOROLAC TROMETHAMINE 30 MG/ML IJ SOLN
INTRAMUSCULAR | Status: AC
  Filled 2015-12-08: qty 1

## 2015-12-08 MED ORDER — NALBUPHINE HCL 10 MG/ML IJ SOLN: 5.0000 mg | INTRAMUSCULAR | Status: DC | PRN

## 2015-12-08 MED ORDER — SIMETHICONE 80 MG PO CHEW
80.0000 mg | CHEWABLE_TABLET | ORAL | Status: DC | PRN
  Administered 2015-12-08: 80 mg via ORAL
  Filled 2015-12-08: qty 1

## 2015-12-08 MED ORDER — NALBUPHINE HCL 10 MG/ML IJ SOLN: 5.0000 mg | Freq: Once | INTRAMUSCULAR | Status: DC | PRN

## 2015-12-08 MED ORDER — TERBUTALINE SULFATE 1 MG/ML IJ SOLN
INTRAMUSCULAR | Status: AC
  Administered 2015-12-08: 0.25 mg via SUBCUTANEOUS
  Filled 2015-12-08: qty 1

## 2015-12-08 MED ORDER — ACETAMINOPHEN 325 MG PO TABS: 650.0000 mg | ORAL_TABLET | ORAL | Status: DC | PRN

## 2015-12-08 MED ORDER — PHENYLEPHRINE 8 MG IN D5W 100 ML (0.08MG/ML) PREMIX OPTIME
INJECTION | INTRAVENOUS | Status: DC | PRN
  Administered 2015-12-08: 60 ug/min via INTRAVENOUS

## 2015-12-08 MED ORDER — TERBUTALINE SULFATE 1 MG/ML IJ SOLN
0.2500 mg | Freq: Once | INTRAMUSCULAR | Status: AC
Start: 2015-12-08 — End: 2015-12-08
  Administered 2015-12-08: 0.25 mg via SUBCUTANEOUS

## 2015-12-08 MED ORDER — NALOXONE HCL 0.4 MG/ML IJ SOLN: 0.4000 mg | INTRAMUSCULAR | Status: DC | PRN

## 2015-12-08 MED ORDER — BUPIVACAINE IN DEXTROSE 0.75-8.25 % IT SOLN
INTRATHECAL | Status: DC | PRN
  Administered 2015-12-08: 1.4 mL via INTRATHECAL

## 2015-12-08 MED ORDER — OXYTOCIN 40 UNITS IN LACTATED RINGERS INFUSION - SIMPLE MED: 62.5000 mL/h | INTRAVENOUS | Status: AC

## 2015-12-08 MED ORDER — SIMETHICONE 80 MG PO CHEW
80.0000 mg | CHEWABLE_TABLET | Freq: Three times a day (TID) | ORAL | Status: DC
  Administered 2015-12-09 – 2015-12-10 (×5): 80 mg via ORAL
  Filled 2015-12-08 (×5): qty 1

## 2015-12-08 MED ORDER — DEXAMETHASONE SODIUM PHOSPHATE 10 MG/ML IJ SOLN
INTRAMUSCULAR | Status: AC
  Filled 2015-12-08: qty 1

## 2015-12-08 MED ORDER — WITCH HAZEL-GLYCERIN EX PADS: 1.0000 "application " | MEDICATED_PAD | CUTANEOUS | Status: DC | PRN

## 2015-12-08 MED ORDER — MENTHOL 3 MG MT LOZG: 1.0000 | LOZENGE | OROMUCOSAL | Status: DC | PRN

## 2015-12-08 MED ORDER — MORPHINE SULFATE (PF) 0.5 MG/ML IJ SOLN
INTRAMUSCULAR | Status: DC | PRN
  Administered 2015-12-08: 4.85 mg via INTRAVENOUS
  Administered 2015-12-08: .15 mg via INTRATHECAL

## 2015-12-08 MED ORDER — SCOPOLAMINE 1 MG/3DAYS TD PT72
MEDICATED_PATCH | TRANSDERMAL | Status: AC
  Filled 2015-12-08: qty 1

## 2015-12-08 MED ORDER — CITRIC ACID-SODIUM CITRATE 334-500 MG/5ML PO SOLN
30.0000 mL | Freq: Once | ORAL | Status: AC
  Administered 2015-12-08: 30 mL via ORAL

## 2015-12-08 MED ORDER — CHLOROPROCAINE HCL (PF) 3 % IJ SOLN
INTRAMUSCULAR | Status: AC
  Filled 2015-12-08: qty 20

## 2015-12-08 MED ORDER — CHLOROPROCAINE HCL (PF) 2 % IJ SOLN
INTRAMUSCULAR | Status: DC | PRN
Start: 2015-12-08 — End: 2015-12-08
  Administered 2015-12-08 (×2): 20 mL

## 2015-12-08 MED ORDER — SODIUM CHLORIDE 0.9 % IJ SOLN
INTRAMUSCULAR | Status: AC
  Filled 2015-12-08: qty 10

## 2015-12-08 MED ORDER — OXYCODONE-ACETAMINOPHEN 5-325 MG PO TABS: 2.0000 | ORAL_TABLET | ORAL | Status: DC | PRN

## 2015-12-08 MED ORDER — DEXTROSE 5 % IV SOLN
3.0000 g | INTRAVENOUS | Status: DC | PRN
  Administered 2015-12-08: 3 g via INTRAVENOUS

## 2015-12-08 MED ORDER — SCOPOLAMINE 1 MG/3DAYS TD PT72
1.0000 | MEDICATED_PATCH | Freq: Once | TRANSDERMAL | Status: DC
  Filled 2015-12-08: qty 1

## 2015-12-08 MED ORDER — PRENATAL MULTIVITAMIN CH
1.0000 | ORAL_TABLET | Freq: Every day | ORAL | Status: DC
  Administered 2015-12-09 – 2015-12-10 (×2): 1 via ORAL
  Filled 2015-12-08 (×2): qty 1

## 2015-12-08 MED ORDER — DEXTROSE 5 % IV SOLN
INTRAVENOUS | Status: AC
  Filled 2015-12-08: qty 3000

## 2015-12-08 MED ORDER — FENTANYL CITRATE (PF) 100 MCG/2ML IJ SOLN: 25.0000 ug | INTRAMUSCULAR | Status: DC | PRN

## 2015-12-08 MED ORDER — PHENYLEPHRINE 8 MG IN D5W 100 ML (0.08MG/ML) PREMIX OPTIME
INJECTION | INTRAVENOUS | Status: AC
  Filled 2015-12-08: qty 100

## 2015-12-08 MED ORDER — OXYTOCIN 10 UNIT/ML IJ SOLN
40.0000 [IU] | INTRAMUSCULAR | Status: DC | PRN
  Administered 2015-12-08: 40 [IU] via INTRAVENOUS

## 2015-12-08 MED ORDER — LIDOCAINE-EPINEPHRINE (PF) 2 %-1:200000 IJ SOLN
INTRAMUSCULAR | Status: AC
  Filled 2015-12-08: qty 20

## 2015-12-08 MED ORDER — DIPHENHYDRAMINE HCL 25 MG PO CAPS: 25.0000 mg | ORAL_CAPSULE | ORAL | Status: DC | PRN

## 2015-12-08 MED ORDER — FENTANYL CITRATE (PF) 100 MCG/2ML IJ SOLN
INTRAMUSCULAR | Status: DC | PRN
  Administered 2015-12-08: 50 ug via INTRAVENOUS
  Administered 2015-12-08: 20 ug via INTRATHECAL
  Administered 2015-12-08: 30 ug via INTRAVENOUS

## 2015-12-08 MED ORDER — IBUPROFEN 600 MG PO TABS: 600.0000 mg | ORAL_TABLET | Freq: Four times a day (QID) | ORAL | Status: DC | PRN

## 2015-12-08 MED ORDER — DEXAMETHASONE SODIUM PHOSPHATE 4 MG/ML IJ SOLN
INTRAMUSCULAR | Status: DC | PRN
  Administered 2015-12-08: 4 mg via INTRAVENOUS

## 2015-12-08 MED ORDER — KETOROLAC TROMETHAMINE 30 MG/ML IJ SOLN: 30.0000 mg | Freq: Four times a day (QID) | INTRAMUSCULAR | Status: AC | PRN

## 2015-12-08 MED ORDER — LANOLIN HYDROUS EX OINT: 1.0000 "application " | TOPICAL_OINTMENT | CUTANEOUS | Status: DC | PRN

## 2015-12-08 MED ORDER — DIBUCAINE 1 % RE OINT: 1.0000 "application " | TOPICAL_OINTMENT | RECTAL | Status: DC | PRN

## 2015-12-08 MED ORDER — MORPHINE SULFATE (PF) 0.5 MG/ML IJ SOLN
INTRAMUSCULAR | Status: AC
  Filled 2015-12-08: qty 10

## 2015-12-08 MED ORDER — DIPHENHYDRAMINE HCL 50 MG/ML IJ SOLN
12.5000 mg | INTRAMUSCULAR | Status: DC | PRN
  Administered 2015-12-08: 12.5 mg via INTRAVENOUS
  Filled 2015-12-08: qty 1

## 2015-12-08 MED ORDER — LACTATED RINGERS IV SOLN
INTRAVENOUS | Status: DC
  Administered 2015-12-08: 22:00:00 via INTRAVENOUS

## 2015-12-08 MED ORDER — FENTANYL CITRATE (PF) 100 MCG/2ML IJ SOLN
INTRAMUSCULAR | Status: AC
  Filled 2015-12-08: qty 2

## 2015-12-08 MED ORDER — IBUPROFEN 600 MG PO TABS
600.0000 mg | ORAL_TABLET | Freq: Four times a day (QID) | ORAL | Status: DC
Start: 2015-12-08 — End: 2015-12-10
  Administered 2015-12-08 – 2015-12-10 (×7): 600 mg via ORAL
  Filled 2015-12-08 (×6): qty 1

## 2015-12-08 MED ORDER — ONDANSETRON HCL 4 MG/2ML IJ SOLN
INTRAMUSCULAR | Status: DC | PRN
  Administered 2015-12-08: 4 mg via INTRAVENOUS

## 2015-12-08 MED ORDER — NALOXONE HCL 2 MG/2ML IJ SOSY
1.0000 ug/kg/h | PREFILLED_SYRINGE | INTRAVENOUS | Status: DC | PRN
  Filled 2015-12-08: qty 2

## 2015-12-08 MED ORDER — DIPHENHYDRAMINE HCL 25 MG PO CAPS: 25.0000 mg | ORAL_CAPSULE | Freq: Four times a day (QID) | ORAL | Status: DC | PRN

## 2015-12-08 MED ORDER — BUPIVACAINE IN DEXTROSE 0.75-8.25 % IT SOLN
INTRATHECAL | Status: AC
  Filled 2015-12-08: qty 4

## 2015-12-08 MED ORDER — OXYCODONE-ACETAMINOPHEN 5-325 MG PO TABS
1.0000 | ORAL_TABLET | ORAL | Status: DC | PRN
  Administered 2015-12-09: 1 via ORAL
  Filled 2015-12-08: qty 1

## 2015-12-08 MED ORDER — MEPERIDINE HCL 25 MG/ML IJ SOLN: 6.2500 mg | INTRAMUSCULAR | Status: DC | PRN

## 2015-12-08 MED ORDER — LACTATED RINGERS IV SOLN
INTRAVENOUS | Status: DC | PRN
  Administered 2015-12-08: 12:00:00 via INTRAVENOUS

## 2015-12-08 MED ORDER — SIMETHICONE 80 MG PO CHEW
80.0000 mg | CHEWABLE_TABLET | ORAL | Status: DC
  Administered 2015-12-09: 80 mg via ORAL
  Filled 2015-12-08: qty 1

## 2015-12-08 MED ORDER — SCOPOLAMINE 1 MG/3DAYS TD PT72
MEDICATED_PATCH | TRANSDERMAL | Status: DC | PRN
  Administered 2015-12-08: 1 via TRANSDERMAL

## 2015-12-08 MED ORDER — BUPIVACAINE IN DEXTROSE 0.75-8.25 % IT SOLN
INTRATHECAL | Status: AC
  Filled 2015-12-08: qty 2

## 2015-12-08 MED ORDER — ONDANSETRON HCL 4 MG/2ML IJ SOLN: 4.0000 mg | Freq: Three times a day (TID) | INTRAMUSCULAR | Status: DC | PRN

## 2015-12-08 MED ORDER — TETANUS-DIPHTH-ACELL PERTUSSIS 5-2.5-18.5 LF-MCG/0.5 IM SUSP: 0.5000 mL | Freq: Once | INTRAMUSCULAR | Status: DC

## 2015-12-08 MED ORDER — SENNOSIDES-DOCUSATE SODIUM 8.6-50 MG PO TABS
2.0000 | ORAL_TABLET | ORAL | Status: DC
  Administered 2015-12-08 – 2015-12-09 (×2): 2 via ORAL
  Filled 2015-12-08 (×2): qty 2

## 2015-12-08 MED ORDER — ONDANSETRON HCL 4 MG/2ML IJ SOLN
INTRAMUSCULAR | Status: AC
  Filled 2015-12-08: qty 2

## 2015-12-08 MED ORDER — KETOROLAC TROMETHAMINE 30 MG/ML IJ SOLN
30.0000 mg | Freq: Four times a day (QID) | INTRAMUSCULAR | Status: AC | PRN
  Administered 2015-12-08: 30 mg via INTRAMUSCULAR

## 2015-12-08 MED ORDER — LIDOCAINE-EPINEPHRINE (PF) 2 %-1:200000 IJ SOLN
INTRAMUSCULAR | Status: DC | PRN
  Administered 2015-12-08: 4 mL via EPIDURAL
  Administered 2015-12-08: 5 mL via EPIDURAL
  Administered 2015-12-08: 3 mL via EPIDURAL

## 2015-12-08 MED ORDER — SODIUM BICARBONATE 8.4 % IV SOLN
INTRAVENOUS | Status: AC
  Filled 2015-12-08: qty 50

## 2015-12-08 SURGICAL SUPPLY — 45 items
APL SKNCLS STERI-STRIP NONHPOA (GAUZE/BANDAGES/DRESSINGS) ×1
BENZOIN TINCTURE PRP APPL 2/3 (GAUZE/BANDAGES/DRESSINGS) ×2 IMPLANT
CLAMP CORD UMBIL (MISCELLANEOUS) IMPLANT
CLOSURE WOUND 1/2 X4 (GAUZE/BANDAGES/DRESSINGS)
CLOTH BEACON ORANGE TIMEOUT ST (SAFETY) ×3 IMPLANT
DRAPE SHEET LG 3/4 BI-LAMINATE (DRAPES) IMPLANT
DRESSING DISP NPWT PICO 4X12 (MISCELLANEOUS) ×2 IMPLANT
DRSG OPSITE POSTOP 4X10 (GAUZE/BANDAGES/DRESSINGS) ×3 IMPLANT
DURAPREP 26ML APPLICATOR (WOUND CARE) ×3 IMPLANT
ELECT REM PT RETURN 9FT ADLT (ELECTROSURGICAL) ×3
ELECTRODE REM PT RTRN 9FT ADLT (ELECTROSURGICAL) ×1 IMPLANT
EXTRACTOR VACUUM KIWI (MISCELLANEOUS) IMPLANT
GLOVE BIO SURGEON ST LM GN SZ9 (GLOVE) ×3 IMPLANT
GLOVE BIOGEL PI IND STRL 7.0 (GLOVE) ×1 IMPLANT
GLOVE BIOGEL PI IND STRL 9 (GLOVE) ×1 IMPLANT
GLOVE BIOGEL PI INDICATOR 7.0 (GLOVE) ×2
GLOVE BIOGEL PI INDICATOR 9 (GLOVE) ×2
GOWN STRL REUS W/TWL 2XL LVL3 (GOWN DISPOSABLE) ×3 IMPLANT
GOWN STRL REUS W/TWL LRG LVL3 (GOWN DISPOSABLE) ×3 IMPLANT
NDL HYPO 25X1 1.5 SAFETY (NEEDLE) IMPLANT
NDL HYPO 25X5/8 SAFETYGLIDE (NEEDLE) IMPLANT
NEEDLE HYPO 25X1 1.5 SAFETY (NEEDLE) ×3 IMPLANT
NEEDLE HYPO 25X5/8 SAFETYGLIDE (NEEDLE) IMPLANT
NS IRRIG 1000ML POUR BTL (IV SOLUTION) ×3 IMPLANT
PACK C SECTION WH (CUSTOM PROCEDURE TRAY) ×3 IMPLANT
PAD OB MATERNITY 4.3X12.25 (PERSONAL CARE ITEMS) ×3 IMPLANT
PENCIL SMOKE EVAC W/HOLSTER (ELECTROSURGICAL) ×3 IMPLANT
RETAINER VISCERAL (MISCELLANEOUS) ×2 IMPLANT
RTRCTR C-SECT PINK 25CM LRG (MISCELLANEOUS) IMPLANT
RTRCTR C-SECT PINK 34CM XLRG (MISCELLANEOUS) IMPLANT
SPONGE LAP 18X18 X RAY DECT (DISPOSABLE) ×2 IMPLANT
STRIP CLOSURE SKIN 1/2X4 (GAUZE/BANDAGES/DRESSINGS) IMPLANT
SUT MNCRL 0 VIOLET CTX 36 (SUTURE) ×2 IMPLANT
SUT MONOCRYL 0 CTX 36 (SUTURE) ×4
SUT VIC AB 0 CT1 27 (SUTURE) ×3
SUT VIC AB 0 CT1 27XBRD ANBCTR (SUTURE) ×1 IMPLANT
SUT VIC AB 0 CT1 36 (SUTURE) ×4 IMPLANT
SUT VIC AB 2-0 CT1 27 (SUTURE) ×6
SUT VIC AB 2-0 CT1 TAPERPNT 27 (SUTURE) ×1 IMPLANT
SUT VIC AB 4-0 KS 27 (SUTURE) ×3 IMPLANT
SYR BULB IRRIGATION 50ML (SYRINGE) IMPLANT
SYRINGE 20CC LL (MISCELLANEOUS) ×2 IMPLANT
TAPE STRIPS DRAPE STRL (GAUZE/BANDAGES/DRESSINGS) ×2 IMPLANT
TOWEL OR 17X24 6PK STRL BLUE (TOWEL DISPOSABLE) ×3 IMPLANT
TRAY FOLEY CATH SILVER 14FR (SET/KITS/TRAYS/PACK) ×3 IMPLANT

## 2015-12-08 NOTE — Progress Notes (Signed)
Patient does not wish to use CPAP at this time.  Patient stated that she just had sleep study done 3 weeks ago and that she has not gotten her home machine nor has she had anyone call to give her the sleep study results.  She also stated that the one that she wore once before made her feel very scared and like she could not breathe.

## 2015-12-08 NOTE — Progress Notes (Signed)
Patient ID: Heidi Landry, female   DOB: 04/10/1986, 29 y.o.   MRN: [id number] [place]) NOTE  Heidi Landry is a 29 y.o. G1P0 at 50w3dwith third trimester bleeding, suspected marginal placental abruption, with persistent uterine contractions, who is admitted for monitoring. U/s on 12/8 shows a transverse presentation, Placenta has been described as posterior, no previa. No ROM.  Pt contractions are uncomfortable to pt..   Fetal presentation is transverse at 12/8 u/s. Length of Stay:  2  Days  Subjective: Pt having mild regular contractions q 4 min, no bleeding at present. Had one pad and one small old dark clot in last 12 hour shift Patient reports the fetal movement as active. Patient reports uterine contraction  activity as regular, every 4 minutes. Patient reports  vaginal bleeding as scant staining. Patient describes fluid per vagina as None.  Vitals:  Blood pressure 118/61, pulse 94, temperature 98.3 F (36.8 C), temperature source Oral, resp. rate 20, height 5' 2"  (1.575 m), weight 325 lb 3.2 oz (147.51 kg), last menstrual period 05/16/2015, SpO2 92 %. Physical Examination:  General appearance - anxious and mild discomfort with contractions. Heart - normal rate and regular rhythm Abdomen - soft, nontender, nondistended Fundal Height:  size equals dates Cervical Exam: Not evaluated. and found to be 1.5 cm on yesterdays exam Extremities: extremities normal, atraumatic, no cyanosis or edema and Homans sign is negative, no sign of DVT with DTRs 2+ bilaterally Membranes:intact  Fetal Monitoring:  Baseline: 145 bpm, Variability: Good {> 6 bpm), Accelerations: Reactive and Decelerations: Absent  Labs:  Results for orders placed or performed during the hospital encounter of 12/06/15 (from the past 24 hour(s))  Glucose, capillary   Collection Time: 12/07/15 11:03 AM  Result Value Ref Range   Glucose-Capillary 104 (H) 65 - 99 mg/dL   Comment 1 Notify  RN    Comment 2 Document in Chart   Glucose, capillary   Collection Time: 12/07/15  3:03 PM  Result Value Ref Range   Glucose-Capillary 108 (H) 65 - 99 mg/dL   Comment 1 Notify RN    Comment 2 Document in Chart   Glucose, capillary   Collection Time: 12/07/15  7:08 PM  Result Value Ref Range   Glucose-Capillary 100 (H) 65 - 99 mg/dL  Glucose, capillary   Collection Time: 12/07/15  8:39 PM  Result Value Ref Range   Glucose-Capillary 82 65 - 99 mg/dL  Glucose, capillary   Collection Time: 12/08/15 12:48 AM  Result Value Ref Range   Glucose-Capillary 104 (H) 65 - 99 mg/dL  Glucose, capillary   Collection Time: 12/08/15  4:59 AM  Result Value Ref Range   Glucose-Capillary 86 65 - 99 mg/dL  CBC   Collection Time: 12/08/15  5:25 AM  Result Value Ref Range   WBC 17.8 (H) 4.0 - 10.5 K/uL   RBC 4.22 3.87 - 5.11 MIL/uL   Hemoglobin 11.8 (L) 12.0 - 15.0 g/dL   HCT 36.9 36.0 - 46.0 %   MCV 87.4 78.0 - 100.0 fL   MCH 28.0 26.0 - 34.0 pg   MCHC 32.0 30.0 - 36.0 g/dL   RDW 14.3 11.5 - 15.5 %   Platelets 180 150 - 400 K/uL    Imaging Studies:    U/s done 12/8 Currently EPIC will not allow sonographic studies to automatically populate into notes.  In the meantime, copy and paste results into note or free text.  Medications:  Scheduled . sodium chloride   Intravenous Once  .  docusate sodium  100 mg Oral Daily  . glyBURIDE  2.5 mg Oral QHS  . labetalol  100 mg Oral Daily  . magnesium  6 g Intravenous Once  . pantoprazole  40 mg Oral Daily  . pencillin G potassium IV  2.5 Million Units Intravenous Q4H  . prenatal multivitamin  1 tablet Oral Q1200   I have reviewed the patient's current medications.  ASSESSMENT: Patient Active Problem List   Diagnosis Date Noted  . Placental abruption in third trimester 12/07/2015  . Gestational diabetes mellitus, class A2/B 08/08/2015  . Rubella non-immune status, antepartum 07/18/2015  . Supervision of other high-risk pregnancy 07/17/2015  .  Chronic hypertension during pregnancy, antepartum 07/17/2015  . Maternal morbid obesity, antepartum ([place]) 07/17/2015  . Smoker 07/17/2015  . Morbid obesity with BMI of 50.0-59.9, adult ([place]) 12/25/2014  . Thoracic spine fracture ([place]) 04/13/2012    PLAN: Continuous monitoring,  U/s by me later this a.m to check presentation Cervical exam to follow. Probable cesarean delivery if bleeding recurs, pt aware T&C x 2 in place.  Rydell Wiegel V 12/08/2015,7:48 AM

## 2015-12-08 NOTE — Transfer of Care (Signed)
Immediate Anesthesia Transfer of Care Note  Patient: Heidi Landry   Procedure(s) Performed: Procedure(s): CESAREAN SECTION (N/A)  Patient Location: PACU  Anesthesia Type:Spinal and Epidural  Level of Consciousness: awake, alert  and oriented  Airway & Oxygen Therapy: Patient Spontanous Breathing  Post-op Assessment: Report given to RN and Post -op Vital signs reviewed and stable  Post vital signs: Reviewed and stable  Last Vitals:  Filed Vitals:   12/08/15 1022 12/08/15 1052  BP: 128/57   Pulse: 99   Temp: 36.7 C   Resp: 22 20    Complications: No apparent anesthesia complications

## 2015-12-08 NOTE — Progress Notes (Signed)
Dr foster at bs for signout---respiratory to consult pt for sleep apnea monitoring.

## 2015-12-08 NOTE — Anesthesia Preprocedure Evaluation (Addendum)
Anesthesia Evaluation  Patient identified by MRN, date of birth, ID band Patient awake    Reviewed: Allergy & Precautions, NPO status , Patient's Chart, lab work & pertinent test results, reviewed documented beta blocker date and time   Airway Mallampati: III  TM Distance: >3 FB Neck ROM: Full    Dental no notable dental hx. (+) Teeth Intact   Pulmonary sleep apnea , Current Smoker,  Had sleep study 11/25/2014 Pt. Has severe OSA which will require titration of CPAP Will use 10-20cm CPAP post op if she goes to surgery.   Pulmonary exam normal breath sounds clear to auscultation       Cardiovascular hypertension, Pt. on medications and Pt. on home beta blockers Normal cardiovascular exam Rhythm:Regular Rate:Normal     Neuro/Psych negative neurological ROS  negative psych ROS   GI/Hepatic Neg liver ROS, GERD  Medicated and Controlled,  Endo/Other  diabetes, Well Controlled, Gestational, Oral Hypoglycemic AgentsMorbid obesity  Renal/GU negative Renal ROS  negative genitourinary   Musculoskeletal  (+) Arthritis ,   Abdominal (+) + obese,   Peds  Hematology negative hematology ROS (+)   Anesthesia Other Findings   Reproductive/Obstetrics (+) Pregnancy IUP- 29 3/7 weeks Placental abruption                            Anesthesia Physical Anesthesia Plan  ASA: III  Anesthesia Plan: Spinal and Combined Spinal and Epidural   Post-op Pain Management:    Induction:   Airway Management Planned: Natural Airway and Nasal Cannula  Additional Equipment:   Intra-op Plan:   Post-operative Plan:   Informed Consent: I have reviewed the patients History and Physical, chart, labs and discussed the procedure including the risks, benefits and alternatives for the proposed anesthesia with the patient or authorized representative who has indicated his/her understanding and acceptance.   Dental advisory  given  Plan Discussed with: CRNA, Anesthesiologist and Surgeon  Anesthesia Plan Comments:         Anesthesia Quick Evaluation

## 2015-12-08 NOTE — Progress Notes (Signed)
Heidi Landry is a 29 y.o. G1P0 at 79w3dby  admitted for bleeding due to marginal abruption with contraction frequency and intensity increasing.   Subjective:Pt continues to contract regularly, despite analgesics.  Pt will not benefit from further efforts to prolong pregnancy. Case discussed with Anesthesia, Will proceed with cesarean delivery for  Preterm labor secondary to marginal abruption, not currently bleeding.    Objective: BP 119/58 mmHg  Pulse 91  Temp(Src) 97.5 F (36.4 C) (Oral)  Resp 20  Ht 5' 2"  (1.575 m)  Wt 325 lb 3.2 oz (147.51 kg)  BMI 59.46 kg/m2  SpO2 93%  LMP 05/16/2015 (Exact Date) I/O last 3 completed shifts: In: 760 [P.O.:60; I.V.:600; IV Piggyback:100] Out: 2050 [Urine:2050] Total I/O In: -  Out: 400 [Urine:400]  FHT:  FHR: 145 bpm, variability: moderate,  accelerations:  Present,  decelerations:  Absent UC:   regular, every 4 minutes SVE:   Dilation: 2 Effacement (%): 50 Station: -3 Exam by:: dr fGlo Herring Labs: Lab Results  Component Value Date   WBC 17.8* 12/08/2015   HGB 11.8* 12/08/2015   HCT 36.9 12/08/2015   MCV 87.4 12/08/2015   PLT 180 12/08/2015    Assessment / Plan: Will proceed with cesarean delivery for  Preterm labor secondary to marginal abruption, not currently bleeding.  Labor: preterm labor Preeclampsia:  none Fetal Wellbeing:  Category I Pain Control:  Fentanyl I/D:  cesarean Anticipated MOD:  cesarean  Valera Vallas V 12/08/2015, 10:19 AM

## 2015-12-08 NOTE — Op Note (Signed)
Cesarean Section Operative Report  Heidi Landry  12/06/2015 - 12/08/2015  Indications: abruption, breech presentation  Pre-operative Diagnosis: abruption, breech presentation, a1gdm, chtn, morbid obesity, maternal tobacco abuse  Post-operative Diagnosis: Same   Surgeon: Surgeon(s) and Role:    * Jonnie Kind, MD - Primary    * Gwynne Edinger, MD - Assisting   Attending Attestation: I was present and scrubbed for the entire procedure.   Assistants: none  Anesthesia: spinal, chloroprocaine topical   Estimated Blood Loss: 700 ml  Total IV Fluids: 2400 ml LR  Urine Output:: 300 ml clear yellow urine  Specimens: placenta to pathology  Findings: Viable female infant in breech presentation; Apgars 6/7; weight 1420 g; arterial cord pH 7.352; clear amniotic fluid; intact placenta with three vessel cord; normal uterus, fallopian tubes and ovaries bilaterally. Dextrorotated uterus.  Baby condition / location:  NICU   Complications: no complications  Indications: Heidi Landry is a 29 y.o. G1P0101 with an IUP [redacted]w[redacted]d pregnancy complicated by morbid obesity (BMI 60), maternal smoking, A1GDM, and chronic hypertension, who presented 12/06/2015 with placental abruption. Patient was managed expectantly; category 1 fetal heart tracing throughout. This morning she began experiencing frequent contractions, with cervical change noted on exam. Given breech presentation and onset of labor, decision made to proceed to cesarean section.  The risks, benefits, complications, treatment options, and expected outcomes were discussed with the patient . The patient concurred with the proposed plan, giving informed consent. identified as Heidi Groomsand the procedure verified as C-Section Delivery.  Procedure Details:  The patient was taken back to the operative suite where spinal anesthesia was placed.  A time out was held and the above information confirmed.   After induction of anesthesia,  the patient was draped and prepped in the usual sterile manner and placed in a dorsal supine position with a leftward tilt. TRAXI panniculus retractor was placed prior to to prep and draping. A transverse incision was made approximately 4 cm above the symphysisis pubis to avoid placing the incision at the patient's pannicular fold, and this was carried down through the subcutaneous tissue to the fascia. Fascial incision was made and sharply extended transversely. The fascia was separated from the underlying rectus tissue superiorly and inferiorly. The peritoneum was identified and bluntly entered and extended longitudinally. The uterus was noted to be significantly dextrorotated. Alexis retractor was placed with 4 moist laparotomy sponges. The lower uterine segment was noted to be well developed. A low transverse uterine incision was made and extended bluntly. Delivered from breech presentation was a viable infant with Apgars and weight as above. The umbilical cord was clamped and cut and cord blood was obtained for evaluation. Cord ph was sent. The placenta was removed Intact and appeared normal. The uterine outline, tubes and ovaries appeared normal}. The uterine incision was closed with running locked sutures of 0 Monocryl with an imbricating layer of the same.   Hemostasis was observed. The peritoneum was closed with 2-0 vicryl. The rectus muscles were examined and hemostasis observed. The fascia was then reapproximated with running sutures of 0Vicryl. The subcuticular closure was performed using 2-0Vicryl. The skin was closed with 4-0coated Vicryl. PICO wound vac was placed.  Instrument, sponge, and needle counts were correct prior the abdominal closure and were correct at the conclusion of the case.     Disposition: PACU - hemodynamically stable.   Maternal Condition: stable       Signed: NEnnis Forts12/09/2015 7:16 PM

## 2015-12-08 NOTE — Anesthesia Procedure Notes (Signed)
Spinal Patient location during procedure: OR Start time: 12/08/2015 11:16 AM Staffing Anesthesiologist: Josephine Igo Performed by: anesthesiologist  Preanesthetic Checklist Completed: patient identified, site marked, surgical consent, pre-op evaluation, timeout performed, IV checked, risks and benefits discussed and monitors and equipment checked Spinal Block Patient position: sitting Prep: site prepped and draped and DuraPrep Patient monitoring: cardiac monitor, continuous pulse ox, blood pressure and heart rate Approach: midline Location: L3-4 Injection technique: catheter Needle Needle type: Tuohy and Spinocan  Needle gauge: 24 G Needle length: 12.7 cm Needle insertion depth: 8 cm Catheter type: closed end flexible Catheter size: 19 g Catheter at skin depth: 14 cm Assessment Sensory level: T4 Additional Notes Epidural performed using 17ga Touhy LOR with saline. SAB performed through Epidural needle CSF clear free flow with transient paresthesia right leg. LA + Narcotics injected and spinal needle withdrawn. Epidural catheter then threaded 6 cm into the epidural space and the Epidural needle was then withdrawn. Sterile dressing was applied and the patient was then place supine with LUD. She tolerated the procedure well. Adequate sensory level obtained.

## 2015-12-08 NOTE — Anesthesia Postprocedure Evaluation (Signed)
Anesthesia Post Note  Patient: Heidi Landry  Procedure(s) Performed: Procedure(s) (LRB): CESAREAN SECTION (N/A)  Patient location during evaluation: PACU Anesthesia Type: Spinal Level of consciousness: oriented and awake and alert Pain management: pain level controlled Vital Signs Assessment: post-procedure vital signs reviewed and stable Respiratory status: spontaneous breathing, respiratory function stable and patient connected to nasal cannula oxygen Cardiovascular status: blood pressure returned to baseline and stable Postop Assessment: no headache, no backache, patient able to bend at knees, spinal receding and no signs of nausea or vomiting Anesthetic complications: no    Last Vitals:  Filed Vitals:   12/08/15 1405 12/08/15 1431  BP:    Pulse:  101  Temp:    Resp: 16 17    Last Pain:  Filed Vitals:   12/08/15 1432  PainSc: 1                  [name] A.

## 2015-12-09 LAB — GLUCOSE, CAPILLARY
Glucose-Capillary: 106 mg/dL — ABNORMAL HIGH (ref 65–99)
Glucose-Capillary: 91 mg/dL (ref 65–99)

## 2015-12-09 LAB — TYPE AND SCREEN
ABO/RH(D): O POS
Antibody Screen: NEGATIVE
UNIT DIVISION: 0
UNIT DIVISION: 0

## 2015-12-09 LAB — CBC
HEMATOCRIT: 32.6 % — AB (ref 36.0–46.0)
Hemoglobin: 10.4 g/dL — ABNORMAL LOW (ref 12.0–15.0)
MCH: 27.9 pg (ref 26.0–34.0)
MCHC: 31.9 g/dL (ref 30.0–36.0)
MCV: 87.4 fL (ref 78.0–100.0)
PLATELETS: 143 10*3/uL — AB (ref 150–400)
RBC: 3.73 MIL/uL — AB (ref 3.87–5.11)
RDW: 14.2 % (ref 11.5–15.5)
WBC: 14.8 10*3/uL — ABNORMAL HIGH (ref 4.0–10.5)

## 2015-12-09 NOTE — Progress Notes (Signed)
Post Operative Day 1 Subjective:  Heidi Landry is a 29 y.o. G1P0101 22w3ds/p emergent primary LTCS secondary to onset of labor and transverse lie.  No acute events overnight.  Pt denies problems with ambulating, voiding or po intake.  She denies nausea or vomiting.  Pain is well controlled.  She has had flatus.  Lochia Minimal.  Plan for birth control is oral progesterone-only contraceptive.  Method of Feeding: Breast, is currently pumping.   Objective: Blood pressure 105/50, pulse 81, temperature 98.9 F (37.2 C), temperature source Oral, resp. rate 14, height 5' 2"  (1.575 m), weight 147.51 kg (325 lb 3.2 oz), last menstrual period 05/16/2015, SpO2 96 %, unknown if currently breastfeeding.  Physical Exam:  General: alert, cooperative and no distress, morbidly obese Lochia:normal flow Chest: normal WOB Heart: Regular rate Abdomen: +BS, soft, mild TTP (appropriate), incision is clean, dry, intact Uterine Fundus: difficult to palpate secondary to body habitus DVT Evaluation: No evidence of DVT seen on physical exam. Extremities: No edema   Recent Labs  12/08/15 0525 12/09/15 0550  HGB 11.8* 10.4*  HCT 36.9 32.6*    Assessment/Plan:  ASSESSMENT: [name] a 29y.o. G1P0101 235w3d/p emergent primary LTCS secondary to onset of labor and transverse lie.   Plan for discharge tomorrow  She will continue pumping to give colostrum to baby in the NICU Regarding A2DM, glyburide stopped and fasting glucose is 91 this morning. Continue checking q4 hours.  Regarding HTN, patient being transitioned to amlodipine. BPs within normal limits.  Patient with several apneic episodes last night, refusing to wear CPAP. Will continue to work with RT. Continue routine PP care Breastfeeding support PRN  LOS: 3 days   Heidi Glaspy2/10/2015, 7:49 AM

## 2015-12-09 NOTE — Lactation Note (Signed)
This note was copied from the chart of [place]. Lactation Consultation Note  Patient Name: Heidi Landry EAKLT'Y Date: 12/09/2015 Reason for consult: Initial assessment;Infant < 6lbs;NICU baby Mom has started pumping but not consistently. Reviewed the importance of pumping every 3 hours for 15-20 minutes. Demonstrated hand expression and advised to hand express after pumping. NICU booklet reviewed with Mom, storage guidelines discussed. Reviewed cleaning of pump parts. Mom has [place] with [facility]. Also has manual and DEBP at home but not sure what model. May need pump from [facility].   Maternal Data    Feeding    LATCH Score/Interventions                      Lactation Tools Discussed/Used Tools: Pump Breast pump type: Double-Electric Breast Pump WIC Program: Yes   Consult Status Consult Status: Follow-up Date: 12/10/15 Follow-up type: In-patient    Katrine Coho 12/09/2015, 7:38 PM

## 2015-12-09 NOTE — Progress Notes (Signed)
Pt experienced 3 episodes of apnea between 2300-0600.  Her o2 sats dropped to 78% during these episodes.  She refused to wear the c-pap, machine, and had on 2.5 L O2 via nasal cannula.  She responded quickly and her sats came up to mid 90's.  She remained calm and cooperative during her care.

## 2015-12-09 NOTE — Progress Notes (Signed)
Patient does not wish to wear CPAP.  Patient was on room air and in chair.  RT advised patient that if she changed her mind that I would come back and help her put on the CPAP mask.

## 2015-12-10 ENCOUNTER — Encounter (HOSPITAL_COMMUNITY): Payer: Self-pay | Admitting: Obstetrics and Gynecology

## 2015-12-10 ENCOUNTER — Encounter: Payer: Medicaid Other | Admitting: Obstetrics & Gynecology

## 2015-12-10 LAB — GLUCOSE, CAPILLARY: Glucose-Capillary: 95 mg/dL (ref 65–99)

## 2015-12-10 MED ORDER — NORETHINDRONE 0.35 MG PO TABS: 1.0000 | ORAL_TABLET | Freq: Every day | ORAL | Status: DC

## 2015-12-10 MED ORDER — OXYCODONE-ACETAMINOPHEN 5-325 MG PO TABS: 1.0000 | ORAL_TABLET | Freq: Four times a day (QID) | ORAL | Status: DC | PRN

## 2015-12-10 MED ORDER — AMLODIPINE BESYLATE 5 MG PO TABS: 5.0000 mg | ORAL_TABLET | Freq: Every day | ORAL | Status: DC

## 2015-12-10 MED ORDER — IBUPROFEN 600 MG PO TABS: 600.0000 mg | ORAL_TABLET | Freq: Four times a day (QID) | ORAL | Status: DC

## 2015-12-10 MED ORDER — PNEUMOCOCCAL VAC POLYVALENT 25 MCG/0.5ML IJ INJ
0.5000 mL | INJECTION | Freq: Once | INTRAMUSCULAR | Status: AC
  Administered 2015-12-10: 0.5 mL via INTRAMUSCULAR
  Filled 2015-12-10 (×2): qty 0.5

## 2015-12-10 NOTE — Discharge Summary (Signed)
OB Discharge Summary  Patient Name: Heidi Landry DOB: 02-18-1986 MRN: [id number]  Date of admission: 12/06/2015 Delivering MD: Jonnie Kind   Date of discharge: 12/10/2015  Admitting diagnosis: 63WKS, BLEEDING,CTX Intrauterine pregnancy: [redacted]w[redacted]d    Secondary diagnosis:Principal Problem:   Placental abruption in third trimester Active Problems:   Supervision of other high-risk pregnancy   Chronic hypertension during pregnancy, antepartum   Maternal morbid obesity, antepartum ([place]   Gestational diabetes mellitus, class A2/B  Additional problems: None    Discharge diagnosis: Preterm Pregnancy Delivered, CHTN and GDM A2, Placental abruption, preterm labor, s/p primary Cesarean delivery.                                                                 Post partum procedures:None  Augmentation: None  Complications: None  Hospital course:  Onset of Labor With Unplanned C/S  29y.o. yo G1P0101 at 241w3das admitted for placental abruption and pre-term labor on 12/06/2015. Patient had a labor course significant for baby with transverse lie and then breech presentation. Membrane Rupture Time/Date: 11:51 AM ,12/08/2015   The patient went for cesarean section due to Malpresentation and pre-term labor, and delivered a Viable infant,12/08/2015  Details of operation can be found in separate operative note. Patient had an uncomplicated postpartum course.  She is ambulating,tolerating a regular diet, passing flatus, and urinating well.  Patient is discharged home in stable condition No discharge date for patient encounter.. Marland Kitchen Physical exam  Filed Vitals:   12/09/15 0147 12/09/15 0606 12/09/15 2108 12/10/15 0619  BP: 106/47 105/50 119/55 103/58  Pulse: 74 81 88 106  Temp: 97.4 F (36.3 C) 98.9 F (37.2 C) 98.5 F (36.9 C) 98.4 F (36.9 C)  TempSrc: Oral Oral Oral Oral  Resp: 20 14 18 18   Height:      Weight:      SpO2: 97% 96% 99% 96%   General: alert, cooperative and no  distress Lochia: appropriate Uterine Fundus: firm Incision: Healing well with no significant drainage, Dressing is clean, dry, and intact DVT Evaluation: No evidence of DVT seen on physical exam. No significant calf/ankle edema. Labs: Lab Results  Component Value Date   WBC 14.8* 12/09/2015   HGB 10.4* 12/09/2015   HCT 32.6* 12/09/2015   MCV 87.4 12/09/2015   PLT 143* 12/09/2015   CMP Latest Ref Rng 12/06/2015  Glucose 65 - 99 mg/dL 98  BUN 6 - 20 mg/dL 5(L)  Creatinine 0.44 - 1.00 mg/dL 0.45  Sodium 135 - 145 mmol/L 135  Potassium 3.5 - 5.1 mmol/L 3.8  Chloride 101 - 111 mmol/L 100(L)  CO2 22 - 32 mmol/L 25  Calcium 8.9 - 10.3 mg/dL 8.1(L)  Total Protein 6.5 - 8.1 g/dL 7.0  Total Bilirubin 0.3 - 1.2 mg/dL 0.8  Alkaline Phos 38 - 126 U/L 100  AST 15 - 41 U/L 15  ALT 14 - 54 U/L 18    Discharge instruction: per After Visit Summary and "Baby and Me Booklet".  After Visit Meds:    Medication List    STOP taking these medications        acetaminophen 500 MG tablet  Commonly known as:  TYLENOL     aspirin 81 MG tablet  docusate sodium 100 MG capsule  Commonly known as:  COLACE     glyBURIDE 2.5 MG tablet  Commonly known as:  DIABETA     labetalol 200 MG tablet  Commonly known as:  NORMODYNE     NIFEdipine 30 MG 24 hr tablet  Commonly known as:  PROCARDIA-XL/ADALAT CC     omeprazole 20 MG capsule  Commonly known as:  PRILOSEC     prenatal vitamin w/FE, FA 27-1 MG Tabs tablet      TAKE these medications        amLODipine 5 MG tablet  Commonly known as:  NORVASC  Take 1 tablet (5 mg total) by mouth daily.     cetirizine 10 MG tablet  Commonly known as:  ZYRTEC  Take 10 mg by mouth daily.     ibuprofen 600 MG tablet  Commonly known as:  ADVIL,MOTRIN  Take 1 tablet (600 mg total) by mouth every 6 (six) hours.     oxyCODONE-acetaminophen 5-325 MG tablet  Commonly known as:  PERCOCET/ROXICET  Take 1 tablet by mouth every 6 (six) hours as needed (for  pain scale 4-7).        Diet: carb modified diet  Activity: Advance as tolerated. Pelvic rest for 6 weeks.   Outpatient follow up:1 week Follow up Appt: to be scheduled with Dr. Glo Herring.   Postpartum contraception: Progesterone only pills  Newborn Data: Live born female  Birth Weight: 3 lb 2.1 oz (1420 g) APGAR: 6, 7  Baby Feeding: Bottle and Breast Disposition:NICU   12/10/2015 Stormy Card, MD

## 2015-12-10 NOTE — Progress Notes (Signed)
Pt's BPs 103-119/50s for last 24 hours.  Phoned Dr. Carolanne Grumbling to ask about Norvasc order for this morning.  She said medication should be held this morning.

## 2015-12-10 NOTE — Progress Notes (Signed)
Pt discharged with husband and mother.  Discharge instructions reviewed, pt verbalized understanding all questions answered.  Did not leave with any equipment, all belongings at bedside taken with pt.  Walked out to the front entrance via wheelchair.

## 2015-12-10 NOTE — Plan of Care (Signed)
Problem: Coping: Goal: Ability to cope will improve Outcome: Completed/Met Date Met:  12/10/15 Has good family support and talks about   Problem: Life Cycle: Goal: Risk for postpartum hemorrhage will decrease Outcome: Completed/Met Date Met:  12/10/15 Small to scant vaginal bleeding.  Problem: Role Relationship: Goal: Ability to demonstrate positive interaction with newborn will improve Outcome: Completed/Met Date Met:  12/10/15 Infnat in NICU and she visits frequently.  Problem: Pain Management: Goal: General experience of comfort will improve and pain level will decrease Outcome: Completed/Met Date Met:  12/10/15 Good pain control on po motrin and Percocet.  Problem: Urinary Elimination: Goal: Ability to reestablish a normal urinary elimination pattern will improve Voids without difficulty and passing flatus.

## 2015-12-10 NOTE — Lactation Note (Signed)
This note was copied from the chart of [place]. Lactation Consultation Note  Patient Name: Heidi Landry RFFMB'W Date: 12/10/2015 Reason for consult: Follow-up assessment;NICU baby NICU baby 25 hours old. Mom reports that she has been pumping but isn't seeing but one drop of colostrum. Discussed normal progression of milk coming to volume. Enc mom to pump 8 times/24 hours followed by hand expression. Mom aware of pumping rooms in NICU. Enc offering STS and nuzzling/latching as she and baby able. Mom has appointment with [facility] office for DEBP and is taking a [facility]. Mom aware of OP/BFSG and [place] phone line assistance after D/C.  Maternal Data    Feeding    LATCH Score/Interventions                      Lactation Tools Discussed/Used     Consult Status Consult Status: PRN    [name] 12/10/2015, 10:36 AM

## 2015-12-10 NOTE — Clinical Social Work Maternal (Signed)
CLINICAL SOCIAL WORK MATERNAL/CHILD NOTE  Patient Details  Name: Heidi Landry MRN: [id number] Date of Birth: 05-Jun-1986  Date:  12/10/2015  Clinical Social Worker Initiating Note:  Lucita Ferrara MSW, LCSW Date/ Time Initiated:  12/10/15/1230    Child's Name:  Sonny Masters   Legal Guardian:  Larae Grooms and [facility]  Need for Interpreter:  None   Date of Referral:  12/10/15     Reason for Referral:  NICU admission  Referral Source:  NICU   Address:  [place], [place] [address]  Phone number:  [phone]   Household Members:  Spouse   Natural Supports (not living in the home):  Immediate Family, Extended Family   Professional Supports: None   Employment: Unemployed   Type of Work:   N/A  Education:    N/A  Pensions consultant:  Medicaid   Other Resources:  [employer]   Cultural/Religious Considerations Which May Impact Care:  None reported  Strengths:  Ability to meet basic needs , Home prepared for child    Risk Factors/Current Problems:  None   Cognitive State:  Able to Concentrate , Alert , Goal Oriented , Linear Thinking    Mood/Affect:  Happy , Interested , Tearful (depdendent on conversation, appropriate for setting)   CSW Assessment:  CSW met with MOB in order to introduce role of CSW in NICU team.  MOB provided consent for the FOB, the MGM, and another visitor to remain in the room during the visit.  MOB and family presented as easily engaged and receptive to the visit. The MOB's mood and affect were noted to be appropriate to the setting and the conversation.  MOB openly discussed her thoughts and feelings secondary to the events that led to Austin's premature birth.  CSW provided supportive listening and assisted her to continue to process her experiences of a traumatic birth as she felt scared, concerned, and anxious as she realized that she had an abruption.  CSW provided education on potential impact on birth trauma on her transition  postpartum.  MOB's family was noted to be supportive as she became tearful, and were encouraging her to continue to process and express her feelings.  MOB and family discussed how they feel "better" since they know that the infant is healthy, and has already been "doing well" in the NICU.  MOB expressed confidence in the care team, knows that it is a temporary situation, and shared that she knows that she can call and visit whenever she wants to.  MGM encouraged MOB to return home and rest, and expressed belief that the MOB has a rare opportunity to rest and recover from her C-section prior to the infant coming home.  MOB and FOB reported that the will be finishing with preparations for the infant, but overall expressed feelings of excitement and happiness secondary to becoming parents.  The MGM stated that she lives in [place] and will be in town for the first couple of weeks postpartum. MOB and FOB denied any barriers to visiting the infant in the NICU.    MOB reported history of depression, but stated that it was "years ago".  She shared that she experienced some "events" during her childhood, but reported that she was able to transition into a state of acceptance and "move on" in her mid 55s.  She denied any anxiety/depression during the pregnancy, and reported that she was the "happiest" she had been.  CSW continued to provide education on normative emotions due to the NICU  admission.  MOB shared that she has already noted that she cries whenever she sees the infant, and is anticipating feeling emotional/tearful when she is discharged later today.  CSW normalized her feelings, and continued to explore the differences in emotions, and highlighted common signs and symptoms of perinatal mood disorders.  MGM and FOB reported that they will also be closely monitoring her mental health, and MOB shared belief that she will be "okay".  CSW continued to discuss commonality of symptoms, and emphasized that the  symptoms often occur outside of a woman's control.    MOB and family expressed appreciation for the visit and support. They acknowledged ongoing availability of CSW during the infant's NICU admission.  MOB verbalized being receptive to ongoing CSW interventions during the infant's NICU admission.    CSW Plan/Description:   1. Patient/Family Education -- perinatal mood and anxiety disorders  2. No Further Intervention Required/No Barriers to Discharge    Sharyl Nimrod 12/10/2015, 1:21 PM

## 2015-12-14 ENCOUNTER — Ambulatory Visit (INDEPENDENT_AMBULATORY_CARE_PROVIDER_SITE_OTHER): Payer: Medicaid Other | Admitting: Obstetrics & Gynecology

## 2015-12-14 VITALS — BP 130/92 | Ht 61.0 in | Wt 316.0 lb

## 2015-12-14 DIAGNOSIS — Z4801 Encounter for change or removal of surgical wound dressing: Secondary | ICD-10-CM

## 2015-12-14 DIAGNOSIS — Z98891 History of uterine scar from previous surgery: Secondary | ICD-10-CM

## 2015-12-14 NOTE — Progress Notes (Signed)
Patient ID: Heidi Landry, female   DOB: Aug 04, 1986, 29 y.o.   MRN: [id number] Pt here today for post op, pt states that she thinks she may have yeast at one end of her incision.   PICO removed and dressing removed  Re dressed   Gentian violet placed  Follow up 1 week

## 2015-12-15 ENCOUNTER — Ambulatory Visit: Payer: Self-pay

## 2015-12-15 NOTE — Lactation Note (Signed)
This note was copied from the chart of [place]. Lactation Consultation Note  Patient Name: Boy Meghan Warshawsky JEADG'N Date: 12/15/2015   Spoke to mom on phone at RN and mom's request about her milk supply.  Infant is GA 20.50; 19 days old.  Hx GDM and C-Section.   Mom reports pumping 7x yesterday during the day but not at all during the night (for past several nights) and only getting 4 ml total combined from the 7 pumpings. Mom reports she got pregnant naturally and denies any thyroid issues.   Mom reports using #24 flange (down from #27 she was previously using) and reports it is a better fit.   Discussed taking Fenugreek as mom asked about galactogogue use for increasing milk supply. LC recommended: 1.  Increasing pumpings to every 2 hrs during the day and at least once during the night for a minimum of 8 pumpings per day.   2.  Using hands-on pumping with hand expression at end of pumping session. 3.  Call [facility] on Monday to set up time for NICU LC to visit and watch mom with pumping for a follow-up and additional recommendations.   4.  Discussed with mom she might need to have PCP check her thyroid levels to rule out any thyroid issues.   Merlene Laughter 12/15/2015, 4:44 PM

## 2015-12-17 ENCOUNTER — Ambulatory Visit: Payer: Self-pay

## 2015-12-17 NOTE — Lactation Note (Signed)
This note was copied from the chart of [place]. Lactation Consultation Note  Met with mom in the NICU pumping room to observe a pumping and discuss milk supply.  Baby is 45 days old and mom is pumping 4 mls total each day.  Mom has wide spaced soft breasts.  She denies any risk factor.  She states she was never diagnosed with PCOS but has had ovarians cysts that ruptured in the past.  She started fenugreek this AM.  Reviewed correct dosage.  C/o exhaustion.  Pumping every 3 hours during the night.  Advised to allow a 5-6 hour period between pumpings at night for more rest.  Encouraged to continue pumping and hopefully supply will increase over next 1-2 weeks.  Mom has a good attitude.    Patient Name: [name] YTKPT'W Date: 12/17/2015     Maternal Data    Feeding Feeding Type: Donor Breast Milk Length of feed: 30 min  LATCH Score/Interventions                      Lactation Tools Discussed/Used     Consult Status      Ave Filter 12/17/2015, 4:09 PM

## 2015-12-21 ENCOUNTER — Ambulatory Visit (INDEPENDENT_AMBULATORY_CARE_PROVIDER_SITE_OTHER): Payer: Medicaid Other | Admitting: Obstetrics & Gynecology

## 2015-12-21 ENCOUNTER — Encounter: Payer: Self-pay | Admitting: Obstetrics & Gynecology

## 2015-12-21 VITALS — BP 120/80 | Ht 62.0 in | Wt 310.0 lb

## 2015-12-21 DIAGNOSIS — Z98891 History of uterine scar from previous surgery: Secondary | ICD-10-CM | POA: Diagnosis not present

## 2015-12-21 MED ORDER — NYSTATIN-TRIAMCINOLONE 100000-0.1 UNIT/GM-% EX OINT: 1.0000 "application " | TOPICAL_OINTMENT | Freq: Two times a day (BID) | CUTANEOUS | Status: DC

## 2015-12-21 NOTE — Progress Notes (Signed)
Patient ID: Heidi Landry, female   DOB: 09/22/1986, 29 y.o.   MRN: [id number] Incision is doing well some moisture chnges Gentian violet placed mytrex prescribed for pt for area on the right  Follow up 3 weeks pp exam

## 2015-12-25 ENCOUNTER — Telehealth: Payer: Self-pay | Admitting: *Deleted

## 2015-12-25 ENCOUNTER — Other Ambulatory Visit: Payer: Self-pay | Admitting: Women's Health

## 2015-12-25 MED ORDER — NYSTATIN 100000 UNIT/GM EX OINT: 1.0000 "application " | TOPICAL_OINTMENT | Freq: Two times a day (BID) | CUTANEOUS | Status: DC

## 2015-12-25 MED ORDER — TRIAMCINOLONE ACETONIDE 0.1 % EX OINT: 1.0000 "application " | TOPICAL_OINTMENT | Freq: Two times a day (BID) | CUTANEOUS | Status: DC

## 2015-12-25 NOTE — Telephone Encounter (Signed)
Pt states what does she need to do for milk production? Taking Fenugreek several times a day, pumping every 2-3 hours. Pt also requesting refill on the blood pressure medicine she was on prior to pregnancy states she is having some spells of dizziness unsure if it is due to breastfeeding, elevated blood pressure, or recent delivery. Pt given an appt for blood pressure evaluation and will route message about Milk production advise.

## 2015-12-26 ENCOUNTER — Ambulatory Visit: Payer: Self-pay

## 2015-12-26 NOTE — Lactation Note (Signed)
This note was copied from the chart of [name]. Lactation Consultation Note  Patient Name: Heidi Landry [id number] Date: 12/26/2015 Reason for consult: Follow-up assessment;NICU baby  NICU baby 64 weeks old. Called to NICU to discuss mom's low milk supply. Mom reports that she has been consistently pumping every 3-4 hours, except for a 4-5 hour stretch while sleeping at night, and that she has been massaging while pumping as well. Mom states that the most she has gotten is 3-4 ml when combining 4-5 pump times. Mom states that she has been using Fenugreek for 8 days with no increase in milk supply. Mom reports that she intends to eat steel cut oatmeal and drink mother's milk tea. Discussed with mom that while the amount of EBM she is getting is not enough to feed the baby, every drop is still valuable--especially for immunity. Mom asked if she should give up, discussed with mom that it is up to when she decides to stop pumping. Discussed with mom that knowing that she has tried everything she has been advised to attempt, means that she can have no regrets. Praised mom for all her efforts and reminded her that every drop the baby has received is beneficial.   Maternal Data    Feeding Feeding Type: Donor Breast Milk Length of feed: 30 min  LATCH Score/Interventions                      Lactation Tools Discussed/Used     Consult Status Consult Status: PRN    [name] 12/26/2015, 2:06 PM

## 2015-12-27 ENCOUNTER — Telehealth: Payer: Self-pay | Admitting: Advanced Practice Midwife

## 2015-12-27 ENCOUNTER — Encounter: Payer: Self-pay | Admitting: Advanced Practice Midwife

## 2015-12-27 ENCOUNTER — Ambulatory Visit (INDEPENDENT_AMBULATORY_CARE_PROVIDER_SITE_OTHER): Payer: Medicaid Other | Admitting: Advanced Practice Midwife

## 2015-12-27 VITALS — BP 158/84 | HR 86 | Ht 62.0 in | Wt 318.0 lb

## 2015-12-27 DIAGNOSIS — O165 Unspecified maternal hypertension, complicating the puerperium: Secondary | ICD-10-CM

## 2015-12-27 DIAGNOSIS — O1002 Pre-existing essential hypertension complicating childbirth: Secondary | ICD-10-CM

## 2015-12-27 MED ORDER — METOCLOPRAMIDE HCL 10 MG PO TABS: 10.0000 mg | ORAL_TABLET | Freq: Three times a day (TID) | ORAL | Status: DC

## 2015-12-27 MED ORDER — METOPROLOL SUCCINATE ER 25 MG PO TB24: 100.0000 mg | ORAL_TABLET | Freq: Every day | ORAL | Status: DC

## 2015-12-27 NOTE — Progress Notes (Signed)
[facility] Visit  Patient name: Heidi Landry MRN [id number]  Date of birth: 1986-04-01  CC & HPI:  Heidi Landry is a 29 y.o. Caucasian female presenting today for BP check. She is 3 weeks sp 29 week CS for abruption/breech.  She had CHTN, and was dc'd home with Norvasc rx.  However, pharmacy did not give it to her, so she hasn't been on any bp meds.  Today, her BP was 150'100's.  Denies HA, blurred vision, RUQ pain.  Has been taking Fenugreek to help with milk supply, howeer, has been taking 3 PO TID instead of 1 PO TID ("more is better").  Fenugreek is listed to possibly increase BP.    Pertinent History Reviewed:  Medical & Surgical Hx:   Past Medical History  Diagnosis Date  . Hypertension   . Edema   . Chronic back pain   . Tendonitis   . IBS (irritable bowel syndrome)   . Degenerative disc disease   . Spondylosis   . Gestational diabetes mellitus, antepartum    Past Surgical History  Procedure Laterality Date  . Cholecystectomy    . Cesarean section N/A 12/08/2015    Procedure: CESAREAN SECTION;  Surgeon: Jonnie Kind, MD;  Location: [facility];  Service: Obstetrics;  Laterality: N/A;   Family History  Problem Relation Age of Onset  . Fibromyalgia Mother   . Cancer Father     lung, colon   . Hernia Father   . Thyroid disease Paternal Aunt   . Diabetes Maternal Grandmother   . Hypertension Maternal Grandmother   . Cancer Paternal Uncle   . Brain cancer Paternal Uncle   . Throat cancer Paternal Uncle     Current outpatient prescriptions:  .  cetirizine (ZYRTEC) 10 MG tablet, Take 10 mg by mouth daily., Disp: , Rfl:  .  docusate sodium (COLACE) 100 MG capsule, Take 100 mg by mouth 2 (two) times daily., Disp: , Rfl:  .  FENUGREEK PO, Take 3 tablets by mouth 3 (three) times daily., Disp: , Rfl:  .  ibuprofen (ADVIL,MOTRIN) 600 MG tablet, Take 1 tablet (600 mg total) by mouth every 6 (six) hours., Disp: 30 tablet, Rfl: 0 .  Prenatal Vit-Fe Fumarate-FA  (MULTIVITAMIN-PRENATAL) 27-0.8 MG TABS tablet, Take 1 tablet by mouth daily at 12 noon., Disp: , Rfl:  .  amLODipine (NORVASC) 5 MG tablet, Take 1 tablet (5 mg total) by mouth daily. (Patient not taking: Reported on 12/27/2015), Disp: 30 tablet, Rfl: 2 .  metoCLOPramide (REGLAN) 10 MG tablet, Take 1 tablet (10 mg total) by mouth 3 (three) times daily before meals., Disp: 90 tablet, Rfl: 2 .  metoprolol succinate (TOPROL-XL) 25 MG 24 hr tablet, Take 4 tablets (100 mg total) by mouth daily. Take with or immediately following a meal., Disp: 30 tablet, Rfl: 1 .  norethindrone (ORTHO MICRONOR) 0.35 MG tablet, Take 1 tablet (0.35 mg total) by mouth daily. (Patient not taking: Reported on 12/27/2015), Disp: 1 Package, Rfl: 11 .  nystatin ointment (MYCOSTATIN), Apply 1 application topically 2 (two) times daily. (Patient not taking: Reported on 12/27/2015), Disp: 30 g, Rfl: 0 .  nystatin-triamcinolone ointment (MYCOLOG), Apply 1 application topically 2 (two) times daily. (Patient not taking: Reported on 12/27/2015), Disp: 30 g, Rfl: 11 .  oxyCODONE-acetaminophen (PERCOCET/ROXICET) 5-325 MG tablet, Take 1 tablet by mouth every 6 (six) hours as needed (for pain scale 4-7). (Patient not taking: Reported on 12/27/2015), Disp: 20 tablet, Rfl: 0 .  triamcinolone ointment (  KENALOG) 0.1 %, Apply 1 application topically 2 (two) times daily. (Patient not taking: Reported on 12/27/2015), Disp: 30 g, Rfl: 0 Social History: Reviewed -  reports that she has been smoking Cigarettes.  She started smoking about 9 years ago. She has a 9 pack-year smoking history. She has never used smokeless tobacco.  Review of Systems:   Constitutional: Negative for fever and chills Eyes: Negative for visual disturbances Respiratory: Negative for shortness of breath, dyspnea Cardiovascular: Negative for chest pain or palpitations  Gastrointestinal: Negative for vomiting, diarrhea and constipation; no abdominal pain Genitourinary: Negative  for dysuria and urgency, vaginal irritation or itching Musculoskeletal: Negative for back pain, joint pain, myalgias  Neurological: Negative for headaches    Objective Findings:  158/84  Physical Examination: General appearance - well appearing, and in no distress Mental status - alert, oriented to person, place, and time Chest:  Normal respiratory effort Heart - normal rate and regular rhythm Abdomen:  Soft, nontender Pelvic: deferred Musculoskeletal:  Normal range of motion without pain Extremities:  No edema    No results found for this or any previous visit (from the past 24 hour(s)).    Assessment & Plan:  A:   Stop Fenugreek;  RX reglan 60m TID  Rx Metoprolol XR 228m(pre pregnancy med) P:  Check BP at home   Return in about 1 week (around 01/03/2016).  CRESENZO-DISHMAN,Virginie Josten CNM 12/27/2015 4:17 PM

## 2015-12-27 NOTE — Telephone Encounter (Signed)
Pt states took half of a Labetalol tablet for her B/P of 150/109. Pt informed to keep her appt today with Manus Gunning for B/P evaluation.

## 2015-12-31 ENCOUNTER — Ambulatory Visit: Payer: Self-pay

## 2015-12-31 NOTE — Lactation Note (Signed)
This note was copied from the chart of [place]. Lactation Consultation Note  Saw mom in the NICU.  She states she has been on reglan for 3 days but no increase in milk supply.  Obtains nothing to a few mls.  She thinks she will stop pumping if no increase at the end of one week on reglan.  Reassured mom that she did all she could and there is nothing she could do different.  Mom is disappointed but understands she is not at fault.  Patient Name: Heidi Landry ZCHYI'[id number] Date: 12/31/2015     Maternal Data    Feeding Feeding Type: Breast Milk Length of feed: 60 min  LATCH Score/Interventions                      Lactation Tools Discussed/Used     Consult Status      Ave Filter 12/31/2015, 2:57 PM

## 2016-01-01 ENCOUNTER — Ambulatory Visit (INDEPENDENT_AMBULATORY_CARE_PROVIDER_SITE_OTHER): Payer: Medicaid Other | Admitting: Advanced Practice Midwife

## 2016-01-01 ENCOUNTER — Encounter: Payer: Self-pay | Admitting: Advanced Practice Midwife

## 2016-01-01 VITALS — BP 132/82 | Ht 62.0 in | Wt 317.5 lb

## 2016-01-01 DIAGNOSIS — R309 Painful micturition, unspecified: Secondary | ICD-10-CM | POA: Diagnosis not present

## 2016-01-01 DIAGNOSIS — O10919 Unspecified pre-existing hypertension complicating pregnancy, unspecified trimester: Secondary | ICD-10-CM

## 2016-01-01 DIAGNOSIS — O165 Unspecified maternal hypertension, complicating the puerperium: Secondary | ICD-10-CM | POA: Diagnosis not present

## 2016-01-01 DIAGNOSIS — Z98891 History of uterine scar from previous surgery: Secondary | ICD-10-CM

## 2016-01-01 MED ORDER — METOPROLOL SUCCINATE ER 25 MG PO TB24: 25.0000 mg | ORAL_TABLET | Freq: Every day | ORAL | Status: DC

## 2016-01-01 MED ORDER — IMIQUIMOD 5 % EX CREA: TOPICAL_CREAM | CUTANEOUS | Status: DC

## 2016-01-01 NOTE — Patient Instructions (Signed)
Upper Respiratory Infection, Adult Most upper respiratory infections (URIs) are a viral infection of the air passages leading to the lungs. A URI affects the nose, throat, and upper air passages. The most common type of URI is nasopharyngitis and is typically referred to as "the common cold." URIs run their course and usually go away on their own. Most of the time, a URI does not require medical attention, but sometimes a bacterial infection in the upper airways can follow a viral infection. This is called a secondary infection. Sinus and middle ear infections are common types of secondary upper respiratory infections. Bacterial pneumonia can also complicate a URI. A URI can worsen asthma and chronic obstructive pulmonary disease (COPD). Sometimes, these complications can require emergency medical care and may be life threatening.  CAUSES Almost all URIs are caused by viruses. A virus is a type of germ and can spread from one person to another.  RISKS FACTORS You may be at risk for a URI if:   You smoke.   You have chronic heart or lung disease.  You have a weakened defense (immune) system.   You are very young or very old.   You have nasal allergies or asthma.  You work in crowded or poorly ventilated areas.  You work in health care facilities or schools. SIGNS AND SYMPTOMS  Symptoms typically develop 2-3 days after you come in contact with a cold virus. Most viral URIs last 7-10 days. However, viral URIs from the influenza virus (flu virus) can last 14-18 days and are typically more severe. Symptoms may include:   Runny or stuffy (congested) nose.   Sneezing.   Cough.   Sore throat.   Headache.   Fatigue.   Fever.   Loss of appetite.   Pain in your forehead, behind your eyes, and over your cheekbones (sinus pain).  Muscle aches.  DIAGNOSIS  Your health care provider may diagnose a URI by:  Physical exam.  Tests to check that your symptoms are not due to  another condition such as:  Strep throat.  Sinusitis.  Pneumonia.  Asthma. TREATMENT  A URI goes away on its own with time. It cannot be cured with medicines, but medicines may be prescribed or recommended to relieve symptoms. Medicines may help:  Reduce your fever.  Reduce your cough.  Relieve nasal congestion. HOME CARE INSTRUCTIONS   Take medicines only as directed by your health care provider.   Gargle warm saltwater or take cough drops to comfort your throat as directed by your health care provider.  Use a warm mist humidifier or inhale steam from a shower to increase air moisture. This may make it easier to breathe.  Drink enough fluid to keep your urine clear or pale yellow.   Eat soups and other clear broths and maintain good nutrition.   Rest as needed.   Return to work when your temperature has returned to normal or as your health care provider advises. You may need to stay home longer to avoid infecting others. You can also use a face mask and careful hand washing to prevent spread of the virus.  Increase the usage of your inhaler if you have asthma.   Do not use any tobacco products, including cigarettes, chewing tobacco, or electronic cigarettes. If you need help quitting, ask your health care provider. PREVENTION  The best way to protect yourself from getting a cold is to practice good hygiene.   Avoid oral or hand contact with people with cold  symptoms.   Wash your hands often if contact occurs.  There is no clear evidence that vitamin C, vitamin E, echinacea, or exercise reduces the chance of developing a cold. However, it is always recommended to get plenty of rest, exercise, and practice good nutrition.  SEEK MEDICAL CARE IF:   You are getting worse rather than better.   Your symptoms are not controlled by medicine.   You have chills.  You have worsening shortness of breath.  You have brown or red mucus.  You have yellow or brown nasal  discharge.  You have pain in your face, especially when you bend forward.  You have a fever.  You have swollen neck glands.  You have pain while swallowing.  You have white areas in the back of your throat. SEEK IMMEDIATE MEDICAL CARE IF:   You have severe or persistent:  Headache.  Ear pain.  Sinus pain.  Chest pain.  You have chronic lung disease and any of the following:  Wheezing.  Prolonged cough.  Coughing up blood.  A change in your usual mucus.  You have a stiff neck.  You have changes in your:  Vision.  Hearing.  Thinking.  Mood. MAKE SURE YOU:   Understand these instructions.  Will watch your condition.  Will get help right away if you are not doing well or get worse.   This information is not intended to replace advice given to you by your health care provider. Make sure you discuss any questions you have with your health care provider.   Document Released: 06/10/2001 Document Revised: 05/01/2015 Document Reviewed: 03/22/2014 Elsevier Interactive Patient Education 2016 Elsevier Inc[employer] Antibiotic Resistance Antibiotics are medicines used to treat infections caused by bacteria. Antibiotic resistance means the medicine no longer works against the bacteria. If this happens, the bacteria can continue to grow and cause infection. CAUSES  The most common cause of antibiotic resistance is the repeated use of antibiotic medicines. This is especially true when the medicine is not necessary. Antibiotics only work against bacterial infections. When antibiotics are given in response to illnesses caused by viruses, like colds or the flu, many normal bacteria in the body are killed. Some bacteria that are not killed may develop resistance to the antibiotic. These bacteria may grow and cause infections that are resistant to some antibiotics. Other causes of antibiotic resistance may include:  Food sources exposed to antibiotics, such as:  Meat.  Produce  grown near livestock treated with antibiotics.  Close contact with someone who has an antibiotic-resistant infection. RISK FACTORS You may be at higher risk for antibiotic resistance if:  You are repeatedly given antibiotics to treat viral infections.  You do not take your medicine as prescribed, such as not finishing all of the medicine.  You need to take antibiotics often because of a long-term medical condition.  You take medicines that weaken your immune system.  You have surgery.  You are elderly.  You need dialysis.  You have an organ transplant.  You are being treated for cancer.  You have a type of infection that is more likely to be caused by resistant bacteria. These include certain:  Skin infections.  Sexually transmitted diseases.  Respiratory infections.  Infections of the lining of the brain and spinal cord (meningitis).  You consume foods from animals treated with antibiotics. Antibiotic-resistant bacteria can be passed through the food.  You live with or care for someone with an antibiotic-resistant infection. SIGNS AND SYMPTOMS The main sign of  antibiotic resistance is having an infection that does not improve with treatment. The specific signs and symptoms you have will depend on the type of infection present. DIAGNOSIS  Your health care provider may suspect antibiotic resistance if your condition does not improve after you have been treated for an infection. You may have tests done, including:  Collection of a fluid sample. This is done to identify the bacteria under a microscope and determine what type of antibiotic will work against it (culture and sensitivity).  Other blood tests and imaging tests. These are done to check if your infection has spread or has become more serious. TREATMENT  Treatment for antibiotic resistance depends on whether you have an active infection and how severe the infection is. If you have an active infection:  Your  health care provider may change your medicine to an antibiotic that kills more types of bacteria (broad spectrum).  Serious antibiotic-resistant infections may need to be treated in the hospital. In some cases, you may need to have the infection drained surgically. You may also need to take medicines through an IV tube. HOME CARE INSTRUCTIONS  Take medicines only as directed by your health care provider.  Take your antibiotic medicine as directed by your health care provider. Finish the antibiotic even if you start to feel better. Make sure you take the correct dose at the scheduled time.  Do not save any of the antibiotics for the next time you get sick.  Do not take an antibiotic that is prescribed for someone else.  Do not take an antibiotic for a viral infection.  Wash your hands often with soap and water.  Keep your vaccinations current, as directed by your health care provider. SEEK MEDICAL CARE IF:  You have a fever or chills.  You are taking a new antibiotic and you are not getting better after a few days.  You develop new symptoms of infection.  You have three or more periods of diarrhea after starting a new antibiotic.  You think you are having a reaction to the antibiotic medicine, such as developing a rash. SEEK IMMEDIATE MEDICAL CARE IF:  You develop a rash, and you also have:  Itching of your tongue or mouth.  A tight feeling in your throat.  Difficulty breathing.  Chest pain or tightness.  Dizziness or fainting.   This information is not intended to replace advice given to you by your health care provider. Make sure you discuss any questions you have with your health care provider.   Document Released: 03/07/2003 Document Revised: 01/05/2015 Document Reviewed: 09/13/2014 Elsevier Interactive Patient Education [employer].

## 2016-01-01 NOTE — Progress Notes (Signed)
[facility] Visit  Patient name: Heidi Landry MRN [id number]  Date of birth: [birth year]  CC & HPI:  Heidi Landry is a 30 y.o. Caucasian female presenting today for blood pressure check.  Wants aldara cream for condyloma.  Stopped fenugreek, hasn't started Mother's milk.  Hasn't seen any change in milk supply (4 days on reglan). Has sniffles, scratchy throat.    Pertinent History Reviewed:  Medical & Surgical Hx:   Past Medical History  Diagnosis Date  . Hypertension   . Edema   . Chronic back pain   . Tendonitis   . IBS (irritable bowel syndrome)   . Degenerative disc disease   . Spondylosis   . Gestational diabetes mellitus, antepartum    Past Surgical History  Procedure Laterality Date  . Cholecystectomy    . Cesarean section N/A 12/08/2015    Procedure: CESAREAN SECTION;  Surgeon: Jonnie Kind, MD;  Location: [facility];  Service: Obstetrics;  Laterality: N/A;   Family History  Problem Relation Age of Onset  . Fibromyalgia Mother   . Cancer Father     lung, colon   . Hernia Father   . Thyroid disease Paternal Aunt   . Diabetes Maternal Grandmother   . Hypertension Maternal Grandmother   . Cancer Paternal Uncle   . Brain cancer Paternal Uncle   . Throat cancer Paternal Uncle     Current outpatient prescriptions:  .  cetirizine (ZYRTEC) 10 MG tablet, Take 10 mg by mouth daily., Disp: , Rfl:  .  docusate sodium (COLACE) 100 MG capsule, Take 100 mg by mouth 2 (two) times daily., Disp: , Rfl:  .  ibuprofen (ADVIL,MOTRIN) 600 MG tablet, Take 1 tablet (600 mg total) by mouth every 6 (six) hours., Disp: 30 tablet, Rfl: 0 .  metoCLOPramide (REGLAN) 10 MG tablet, Take 1 tablet (10 mg total) by mouth 3 (three) times daily before meals., Disp: 90 tablet, Rfl: 2 .  metoprolol succinate (TOPROL-XL) 25 MG 24 hr tablet, Take 1 tablet (25 mg total) by mouth daily. Take with or immediately following a meal., Disp: 30 tablet, Rfl: 1 .  Prenatal Vit-Fe Fumarate-FA  (MULTIVITAMIN-PRENATAL) 27-0.8 MG TABS tablet, Take 1 tablet by mouth daily at 12 noon., Disp: , Rfl:  .  amLODipine (NORVASC) 5 MG tablet, Take 1 tablet (5 mg total) by mouth daily. (Patient not taking: Reported on 12/27/2015), Disp: 30 tablet, Rfl: 2 .  norethindrone (ORTHO MICRONOR) 0.35 MG tablet, Take 1 tablet (0.35 mg total) by mouth daily. (Patient not taking: Reported on 12/27/2015), Disp: 1 Package, Rfl: 11 .  nystatin ointment (MYCOSTATIN), Apply 1 application topically 2 (two) times daily. (Patient not taking: Reported on 12/27/2015), Disp: 30 g, Rfl: 0 .  nystatin-triamcinolone ointment (MYCOLOG), Apply 1 application topically 2 (two) times daily. (Patient not taking: Reported on 12/27/2015), Disp: 30 g, Rfl: 11 .  triamcinolone ointment (KENALOG) 0.1 %, Apply 1 application topically 2 (two) times daily. (Patient not taking: Reported on 12/27/2015), Disp: 30 g, Rfl: 0 Social History: Reviewed -  reports that she has been smoking Cigarettes.  She started smoking about 9 years ago. She has a 9 pack-year smoking history. She has never used smokeless tobacco.  Review of Systems:   Constitutional: Negative for fever and chills Eyes: Negative for visual disturbances Respiratory: Negative for shortness of breath, dyspnea Cardiovascular: Negative for chest pain or palpitations  Gastrointestinal: Negative for vomiting, diarrhea and constipation; no abdominal pain Genitourinary: Negative for dysuria and urgency, vaginal  irritation or itching/.  Has some "pressure" with voiding. Musculoskeletal: Negative for back pain, joint pain, myalgias  Neurological: Negative for dizziness and headaches    Objective Findings:   Urine Blood only Physical Examination: General appearance - well appearing, and in no distress Mental status - alert, oriented to person, place, and time Chest:  Normal respiratory effort Heart - normal rate and regular rhythm Abdomen:  Soft, nontender.  Incision healing well  per LHE Pelvic: deferred Musculoskeletal:  Normal range of motion without pain Extremities:  No edema    No results found for this or any previous visit (from the past 24 hour(s)).    Assessment & Plan:  A:   CHTN, bp normal on meds  Class A2/B DM-hasn't checked BS postpartum P:  Plan 2 hr GTT with PP appt (coming in 2 hours early)  Start Micronoir   Start Mother's Milk as directed  `Rx aldara  Culture urine  Return for As scheduled. for PP check up  CRESENZO-DISHMAN,Patte Winkel CNM 01/01/2016 9:38 AM

## 2016-01-01 NOTE — Addendum Note (Signed)
Addended by: Farley Ly on: 01/01/2016 10:05 AM   Modules accepted: Orders

## 2016-01-03 LAB — URINE CULTURE

## 2016-01-07 ENCOUNTER — Telehealth: Payer: Self-pay | Admitting: Obstetrics & Gynecology

## 2016-01-07 NOTE — Telephone Encounter (Signed)
Pt c/o period like cramps, vaginal bleeding, c-section on 12/08/2015. Pt states she thinks it is her period. Pt informed probably is her period, usually first period after delivery is heavy, but if changing 1 pad and hour needs to go to ER or come here to be seen. Pt states she feels is her period, not changing a pad an hour. Pt to call our office back if needed.

## 2016-01-14 ENCOUNTER — Ambulatory Visit: Payer: Self-pay

## 2016-01-14 NOTE — Lactation Note (Signed)
This note was copied from the chart of [place]. Lactation Consultation Note  Patient Name: Boy Shahira Fiske [id number] Date: 01/14/2016 Reason for consult: Follow-up assessment;NICU baby  NICU baby 32 weeks old. Mom reports that she did take Reglan in order to increase her milk supply. However, she never really pumped more that a few ml. Mom states that she had a cold and stayed away from NICU during that time. Mom reports that she didn't want to take cold medicine and give EBM during that time to baby. So mom pumped ahead to give baby the EBM with her antibodies in it to the baby, and stopped pumping altogether to start her cold medications. Discussed with mom that every drop of her EBM that the baby received was to his benefit. Mom agreed.  Maternal Data    Feeding Feeding Type: Formula Length of feed: 30 min (10 min nipple and 20 min gavage)  LATCH Score/Interventions                      Lactation Tools Discussed/Used     Consult Status Consult Status: PRN    [name] 01/14/2016, 3:22 PM

## 2016-01-17 ENCOUNTER — Ambulatory Visit (INDEPENDENT_AMBULATORY_CARE_PROVIDER_SITE_OTHER): Payer: Medicaid Other | Admitting: Obstetrics & Gynecology

## 2016-01-17 ENCOUNTER — Encounter: Payer: Self-pay | Admitting: Obstetrics & Gynecology

## 2016-01-17 DIAGNOSIS — N920 Excessive and frequent menstruation with regular cycle: Secondary | ICD-10-CM

## 2016-01-17 LAB — POCT HEMOGLOBIN: Hemoglobin: 11.8 g/dL — AB (ref 12.2–16.2)

## 2016-01-17 NOTE — Progress Notes (Signed)
Patient ID: Heidi Landry, female   DOB: [birth year], 30 y.o.   MRN: [id number] Subjective:     Heidi Landry is a 30 y.o. female who presents for a postpartum visit. She is 5 weeks postpartum following a low cervical transverse Cesarean section. I have fully reviewed the prenatal and intrapartum course. The delivery was at 29 gestational weeks. Outcome: primary cesarean section, low transverse incision. Anesthesia: spinal. Postpartum course has been unremarkable. Baby's course has been good, ffeding growing. Baby is feeding by . Bleeding having firt period. Bowel function is normal. Bladder function is normal. Patient is not sexually active. Contraception method is oral progesterone-only contraceptive. Postpartum depression screening: negative.  The following portions of the patient's history were reviewed and updated as appropriate: allergies, current medications, past family history, past medical history, past social history, past surgical history and problem list.  Review of Systems Pertinent items are noted in HPI.   Objective:    BP 110/80 mmHg  Pulse 80  Wt 322 lb (146.058 kg)  General:  alert, cooperative and no distress   Breasts:    Lungs:   Heart:    Abdomen: soft, non-tender; bowel sounds normal; no masses,  no organomegaly   Vulva:  normal  Vagina: normal vagina  Cervix:  no cervical motion tenderness and no lesions  Corpus: normal size, contour, position, consistency, mobility, non-tender  Adnexa:  normal adnexa  Rectal Exam:         Assessment:     normal postpartum exam. Pap smear not done at today's visit.   Plan:    1. Contraception: oral progesterone-only contraceptive 2. Not breast feeding 3. Follow up in: 6 months or as needed.

## 2016-01-20 ENCOUNTER — Encounter (HOSPITAL_COMMUNITY): Payer: Self-pay | Admitting: *Deleted

## 2016-01-20 ENCOUNTER — Inpatient Hospital Stay (HOSPITAL_COMMUNITY)
Admission: AD | Admit: 2016-01-20 | Discharge: 2016-01-20 | Disposition: A | Discharge status: Home / Self Care | Payer: Medicaid Other | Source: Ambulatory Visit | Attending: Obstetrics & Gynecology | Admitting: Obstetrics & Gynecology

## 2016-01-20 DIAGNOSIS — Z87891 Personal history of nicotine dependence: Secondary | ICD-10-CM | POA: Diagnosis not present

## 2016-01-20 DIAGNOSIS — O99345 Other mental disorders complicating the puerperium: Secondary | ICD-10-CM

## 2016-01-20 DIAGNOSIS — F53 Postpartum depression: Secondary | ICD-10-CM

## 2016-01-20 LAB — URINALYSIS, ROUTINE W REFLEX MICROSCOPIC
BILIRUBIN URINE: NEGATIVE
GLUCOSE, UA: NEGATIVE mg/dL
KETONES UR: NEGATIVE mg/dL
NITRITE: NEGATIVE
PROTEIN: NEGATIVE mg/dL
Specific Gravity, Urine: 1.025 (ref 1.005–1.030)
pH: 5 (ref 5.0–8.0)

## 2016-01-20 LAB — URINE MICROSCOPIC-ADD ON: BACTERIA UA: NONE SEEN

## 2016-01-20 MED ORDER — SERTRALINE HCL 50 MG PO TABS: 50.0000 mg | ORAL_TABLET | Freq: Every day | ORAL | Status: DC

## 2016-01-20 NOTE — MAU Provider Note (Signed)
History     CSN: [id number]  Arrival date and time: 01/20/16 1437   First Provider Initiated Contact with Patient 01/20/16 1525      Chief Complaint  Patient presents with  . Depression   HPI Heidi Landry 30 y.o. G1P0101 postpartum from c-section on 12/08/15 now concerned for postpartum depression.  She started feeling "not herself" about 2 weeks after delivery.  She feels it continues to worsen.  She is yelling at her husband for no good reason, throwing things, getting upset about things that normally wouldn't bother her.  She feels guilty whenever she is not with her baby in NICU.  She cries lots.  Mood swings.  Goes from yelling at husband to crying.  She feels like she is losing her mind.  She states she has no thoughts of hurting herself or someone else.   She has had her postpartum visit already but did not discuss this.  She feels that she was not completely honest with them or on the test she took while there.  She wanted to hide it but it continues to get worse.   She came here today - after seeing baby in NICU.   She reports she has previously had depression anxiety and had bald spots on her head from stress.  She was medicated with Trazodone.   She was having heavy bleeding earlier this week with weakness.  (She had 4 weeks of bleeding following delivery.  Then small break and then 2 weeks of bleeding.  She saw Dr. Elonda Husky earlier last week.  Today, bleeding is better.  She is using progesterone only pills.   OB History    Gravida Para Term Preterm AB TAB SAB Ectopic Multiple Living   1 1  1      0 1      Past Medical History  Diagnosis Date  . Hypertension   . Edema   . Chronic back pain   . Tendonitis   . IBS (irritable bowel syndrome)   . Degenerative disc disease   . Spondylosis   . Gestational diabetes mellitus, antepartum     Past Surgical History  Procedure Laterality Date  . Cholecystectomy    . Cesarean section N/A 12/08/2015    Procedure: CESAREAN  SECTION;  Surgeon: Jonnie Kind, MD;  Location: [facility];  Service: Obstetrics;  Laterality: N/A;    Family History  Problem Relation Age of Onset  . Fibromyalgia Mother   . Cancer Father     lung, colon   . Hernia Father   . Thyroid disease Paternal Aunt   . Diabetes Maternal Grandmother   . Hypertension Maternal Grandmother   . Cancer Paternal Uncle   . Brain cancer Paternal Uncle   . Throat cancer Paternal Uncle     Social History  Substance Use Topics  . Smoking status: Former Smoker -- 1.00 packs/day for 9 years    Types: Cigarettes    Start date: 05/25/2006  . Smokeless tobacco: Never Used  . Alcohol Use: No    Allergies: No Known Allergies  Prescriptions prior to admission  Medication Sig Dispense Refill Last Dose  . docusate sodium (COLACE) 100 MG capsule Take 100 mg by mouth 2 (two) times daily.   01/20/2016 at Unknown time  . metoprolol succinate (TOPROL-XL) 25 MG 24 hr tablet Take 1 tablet (25 mg total) by mouth daily. Take with or immediately following a meal. 30 tablet 1 01/20/2016 at Unknown time  . norethindrone (ORTHO MICRONOR) 0.35  MG tablet Take 1 tablet (0.35 mg total) by mouth daily. 1 Package 11 01/20/2016 at Unknown time  . Prenatal Vit-Fe Fumarate-FA (MULTIVITAMIN-PRENATAL) 27-0.8 MG TABS tablet Take 1 tablet by mouth daily at 12 noon.   01/20/2016 at Unknown time  . cetirizine (ZYRTEC) 10 MG tablet Take 10 mg by mouth daily.    prn  . ibuprofen (ADVIL,MOTRIN) 600 MG tablet Take 1 tablet (600 mg total) by mouth every 6 (six) hours. 30 tablet 0 prn  . imiquimod (ALDARA) 5 % cream Apply topically 3 (three) times a week. (Patient not taking: Reported on 01/20/2016) 12 each 3 Not Taking at Unknown time  . triamcinolone ointment (KENALOG) 0.1 % Apply 1 application topically 2 (two) times daily. 30 g 0 prn    ROS Pertinent ROS in HPI.  All other systems are negative.   Physical Exam   Blood pressure 108/56, pulse 88, temperature 98.2 F (36.8 C), temperature  source Oral, resp. rate 16, currently breastfeeding.  Physical Exam  Constitutional: She is oriented to person, place, and time. She appears well-developed and well-nourished. No distress.  HENT:  Head: Normocephalic and atraumatic.  Eyes: Conjunctivae and EOM are normal.  Cardiovascular: Normal rate and normal heart sounds.   Respiratory: Effort normal. No respiratory distress.  Neurological: She is alert and oriented to person, place, and time.  Skin: Skin is warm and dry.  Psychiatric: She has a normal mood and affect. Her behavior is normal.    MAU Course  Procedures  MDM Vital stable Pt denies suicidal/homicidal ideation.  Discussed with Dr. Ihor Dow whom is agreeable to start pt on zoloft.   Assessment and Plan  A:  1. Post partum depression    P: Discharge to home Begin zoloft - titrate to 50m daily as tolerated See OB/GYN asap to further discuss Consider counselling Pt advised the medication may take several weeks for noticeable improvement.  If symptoms worsen, seek care immediately at ED.  Patient may return to MAU as needed or if her condition were to change or worsen   KPaticia Stack1/22/2017, 3:26 PM

## 2016-01-20 NOTE — Discharge Instructions (Signed)
Postpartum Depression and Baby Blues The postpartum period begins right after the birth of a baby. During this time, there is often a great amount of joy and excitement. It is also a time of many changes in the life of the parents. Regardless of how many times a mother gives birth, each child brings new challenges and dynamics to the family. It is not unusual to have feelings of excitement along with confusing shifts in moods, emotions, and thoughts. All mothers are at risk of developing postpartum depression or the "baby blues." These mood changes can occur right after giving birth, or they may occur many months after giving birth. The baby blues or postpartum depression can be mild or severe. Additionally, postpartum depression can go away rather quickly, or it can be a long-term condition.  CAUSES Raised hormone levels and the rapid drop in those levels are thought to be a main cause of postpartum depression and the baby blues. A number of hormones change during and after pregnancy. Estrogen and progesterone usually decrease right after the delivery of your baby. The levels of thyroid hormone and various cortisol steroids also rapidly drop. Other factors that play a role in these mood changes include major life events and genetics.  RISK FACTORS If you have any of the following risks for the baby blues or postpartum depression, know what symptoms to watch out for during the postpartum period. Risk factors that may increase the likelihood of getting the baby blues or postpartum depression include:  Having a personal or family history of depression.   Having depression while being pregnant.   Having premenstrual mood issues or mood issues related to oral contraceptives.  Having a lot of life stress.   Having marital conflict.   Lacking a social support network.   Having a baby with special needs.   Having health problems, such as diabetes.  SIGNS AND SYMPTOMS Symptoms of baby blues  include:  Brief changes in mood, such as going from extreme happiness to sadness.  Decreased concentration.   Difficulty sleeping.   Crying spells, tearfulness.   Irritability.   Anxiety.  Symptoms of postpartum depression typically begin within the first month after giving birth. These symptoms include:  Difficulty sleeping or excessive sleepiness.   Marked weight loss.   Agitation.   Feelings of worthlessness.   Lack of interest in activity or food.  Postpartum psychosis is a very serious condition and can be dangerous. Fortunately, it is rare. Displaying any of the following symptoms is cause for immediate medical attention. Symptoms of postpartum psychosis include:   Hallucinations and delusions.   Bizarre or disorganized behavior.   Confusion or disorientation.  DIAGNOSIS  A diagnosis is made by an evaluation of your symptoms. There are no medical or lab tests that lead to a diagnosis, but there are various questionnaires that a health care provider may use to identify those with the baby blues, postpartum depression, or psychosis. Often, a screening tool called the [place] Postnatal Depression Scale is used to diagnose depression in the postpartum period.  TREATMENT The baby blues usually goes away on its own in 1-2 weeks. Social support is often all that is needed. You will be encouraged to get adequate sleep and rest. Occasionally, you may be given medicines to help you sleep.  Postpartum depression requires treatment because it can last several months or longer if it is not treated. Treatment may include individual or group therapy, medicine, or both to address any social, physiological, and psychological  factors that may play a role in the depression. Regular exercise, a healthy diet, rest, and social support may also be strongly recommended.  Postpartum psychosis is more serious and needs treatment right away. Hospitalization is often needed. HOME CARE  INSTRUCTIONS  Get as much rest as you can. Nap when the baby sleeps.   Exercise regularly. Some women find yoga and walking to be beneficial.   Eat a balanced and nourishing diet.   Do little things that you enjoy. Have a cup of tea, take a bubble bath, read your favorite magazine, or listen to your favorite music.  Avoid alcohol.   Ask for help with household chores, cooking, grocery shopping, or running errands as needed. Do not try to do everything.   Talk to people close to you about how you are feeling. Get support from your partner, family members, friends, or other new moms.  Try to stay positive in how you think. Think about the things you are grateful for.   Do not spend a lot of time alone.   Only take over-the-counter or prescription medicine as directed by your health care provider.  Keep all your postpartum appointments.   Let your health care provider know if you have any concerns.  SEEK MEDICAL CARE IF: You are having a reaction to or problems with your medicine. SEEK IMMEDIATE MEDICAL CARE IF:  You have suicidal feelings.   You think you may harm the baby or someone else. MAKE SURE YOU:  Understand these instructions.  Will watch your condition.  Will get help right away if you are not doing well or get worse.   This information is not intended to replace advice given to you by your health care provider. Make sure you discuss any questions you have with your health care provider.   Document Released: 09/18/2004 Document Revised: 12/20/2013 Document Reviewed: 09/26/2013 Elsevier Interactive Patient Education [employer].

## 2016-01-20 NOTE — MAU Note (Signed)
Pt states she has been noticing symptoms since she had her baby on Dec 10 via c-section.  Her baby has been in the NICU and she have been having difficulty dealing with him being in the NICU.  She states she feels guilty and like a bad parent if she can't get up here to see him everyday.  She states the only time she feels at peace or not upset is when she is with baby Heidi Landry in the NICU.  Pt states that when she is at home she can have short happy moments but then in a blink of an eye she can start crying and screaming at her husband for no reason.  She states it is not uncommon for her to just feel like crying.  She states over the last week her symptoms have gotten worse and she has been crying more and screaming at her husband more for things that she would normally not get upset about.  Pt states, "sometimes I feel like I am loosing my mind."  Pt states, "I know it is something because I am not myself at all."  She states that her husband agrees that something isn't right and that is why she was brought in to see someone.  Pt states that she stopped bleeding about 4 weeks after having her baby.  About two weeks ago she started bleeding more and it is heavier and bright red.  She states she saw Dr. Elonda Husky on Thursday and he told her that it was her first period after having a baby but she is unsure if it is normal due to how long it has lasted and since it is making her feel weak.  They checked her hemoglobin while she was in the office.  She states he did not tell her a value but he told her that it was normal.

## 2016-01-21 ENCOUNTER — Other Ambulatory Visit: Payer: Self-pay | Admitting: Obstetrics and Gynecology

## 2016-01-21 NOTE — Telephone Encounter (Signed)
refil condylox completed

## 2016-01-22 ENCOUNTER — Ambulatory Visit (INDEPENDENT_AMBULATORY_CARE_PROVIDER_SITE_OTHER): Payer: Medicaid Other | Admitting: Obstetrics & Gynecology

## 2016-01-22 ENCOUNTER — Ambulatory Visit: Payer: Medicaid Other | Admitting: Women's Health

## 2016-01-22 ENCOUNTER — Encounter: Payer: Self-pay | Admitting: Obstetrics & Gynecology

## 2016-01-22 VITALS — BP 120/70 | HR 74 | Wt 319.0 lb

## 2016-01-22 DIAGNOSIS — F53 Postpartum depression: Secondary | ICD-10-CM

## 2016-01-22 DIAGNOSIS — O99345 Other mental disorders complicating the puerperium: Principal | ICD-10-CM

## 2016-01-22 MED ORDER — OMEPRAZOLE 20 MG PO CPDR: 20.0000 mg | DELAYED_RELEASE_CAPSULE | Freq: Every day | ORAL | Status: DC

## 2016-01-22 NOTE — Progress Notes (Signed)
Patient ID: Heidi Landry, female   DOB: 17-Sep-1986, 30 y.o.   MRN: [id number]      Chief Complaint  Patient presents with  . Follow-up    postpartum depression    Filed Vitals:   01/22/16 0853  BP: 120/70  Pulse: 29     30 y.o. G1P0101 No LMP recorded. The current method of family planning is oral progesterone-only contraceptive.  Subjective Pt has noted an increase of her emotional lability over the past 2 weeks Baby still in the NICU since delivery, delivered at 31 weeks Crying spells Anger outbursts  Objective   Pertinent ROS no suicidal ideation or thoughts of harming herself or the baby  Labs or studies Reviewed the ED notes     Impression Diagnoses this Encounter::   ICD-9-CM ICD-10-CM   1. Postpartum depression 648.44 F53    311      Established relevant diagnosis(es):   Plan/Recommendations: No orders of the defined types were placed in this encounter.    Labs or Scans Ordered: No orders of the defined types were placed in this encounter.    Management:: Pt has begun zoloft 50 mg daily throught the ED visit Will re evaluate in 4 weeks to see if dosing needs to be altered  Follow up 4  weeks       Face to face time:  15 minutes  Greater than 50% of the visit time was spent in counseling and coordination of care with the patient.  The summary and outline of the counseling and care coordination is summarized in the note above.   All questions were answered.

## 2016-01-22 NOTE — Addendum Note (Signed)
Addended by: Florian Buff on: 01/22/2016 09:34 AM   Modules accepted: Orders

## 2016-01-24 ENCOUNTER — Telehealth: Payer: Self-pay | Admitting: Obstetrics & Gynecology

## 2016-01-24 NOTE — Telephone Encounter (Signed)
Pt states Zoloft for postpartum depression, c/o side effects of chest pain, diarrhea, bad headaches. Pt advised to go to ER for evaluation of chest pains. Pt verbalized understanding.

## 2016-01-25 ENCOUNTER — Inpatient Hospital Stay (HOSPITAL_COMMUNITY)
Admission: AD | Admit: 2016-01-25 | Discharge: 2016-01-25 | Disposition: A | Discharge status: Home / Self Care | Payer: Medicaid Other | Source: Ambulatory Visit | Attending: Obstetrics & Gynecology | Admitting: Obstetrics & Gynecology

## 2016-01-25 ENCOUNTER — Encounter (HOSPITAL_COMMUNITY): Payer: Self-pay | Admitting: *Deleted

## 2016-01-25 DIAGNOSIS — R0789 Other chest pain: Secondary | ICD-10-CM

## 2016-01-25 DIAGNOSIS — I10 Essential (primary) hypertension: Secondary | ICD-10-CM | POA: Diagnosis not present

## 2016-01-25 DIAGNOSIS — R079 Chest pain, unspecified: Secondary | ICD-10-CM | POA: Diagnosis present

## 2016-01-25 DIAGNOSIS — Z87891 Personal history of nicotine dependence: Secondary | ICD-10-CM | POA: Diagnosis not present

## 2016-01-25 DIAGNOSIS — F439 Reaction to severe stress, unspecified: Secondary | ICD-10-CM | POA: Diagnosis not present

## 2016-01-25 LAB — URINALYSIS, ROUTINE W REFLEX MICROSCOPIC
BILIRUBIN URINE: NEGATIVE
Glucose, UA: NEGATIVE mg/dL
KETONES UR: NEGATIVE mg/dL
Leukocytes, UA: NEGATIVE
NITRITE: NEGATIVE
Protein, ur: NEGATIVE mg/dL
Specific Gravity, Urine: <1.005 — ABNORMAL LOW (ref 1.005–1.030)
pH: 6.5 (ref 5.0–8.0)

## 2016-01-25 LAB — POCT PREGNANCY, URINE: PREG TEST UR: NEGATIVE

## 2016-01-25 LAB — URINE MICROSCOPIC-ADD ON

## 2016-01-25 NOTE — Discharge Instructions (Signed)
Stress and Stress Management Stress is a normal reaction to life events. It is what you feel when life demands more than you are used to or more than you can handle. Some stress can be useful. For example, the stress reaction can help you catch the last bus of the day, study for a test, or meet a deadline at work. But stress that occurs too often or for too long can cause problems. It can affect your emotional health and interfere with relationships and normal daily activities. Too much stress can weaken your immune system and increase your risk for physical illness. If you already have a medical problem, stress can make it worse. CAUSES  All sorts of life events may cause stress. An event that causes stress for one person may not be stressful for another person. Major life events commonly cause stress. These may be positive or negative. Examples include losing your job, moving into a new home, getting married, having a baby, or losing a loved one. Less obvious life events may also cause stress, especially if they occur day after day or in combination. Examples include working long hours, driving in traffic, caring for children, being in debt, or being in a difficult relationship. SIGNS AND SYMPTOMS Stress may cause emotional symptoms including, the following:  Anxiety. This is feeling worried, afraid, on edge, overwhelmed, or out of control.  Anger. This is feeling irritated or impatient.  Depression. This is feeling sad, down, helpless, or guilty.  Difficulty focusing, remembering, or making decisions. Stress may cause physical symptoms, including the following:   Aches and pains. These may affect your head, neck, back, stomach, or other areas of your body.  Tight muscles or clenched jaw.  Low energy or trouble sleeping. Stress may cause unhealthy behaviors, including the following:   Eating to feel better (overeating) or skipping meals.  Sleeping too little, too much, or both.  Working  too much or putting off tasks (procrastination).  Smoking, drinking alcohol, or using drugs to feel better. DIAGNOSIS  Stress is diagnosed through an assessment by your health care provider. Your health care provider will ask questions about your symptoms and any stressful life events.Your health care provider will also ask about your medical history and may order blood tests or other tests. Certain medical conditions and medicine can cause physical symptoms similar to stress. Mental illness can cause emotional symptoms and unhealthy behaviors similar to stress. Your health care provider may refer you to a mental health professional for further evaluation.  TREATMENT  Stress management is the recommended treatment for stress.The goals of stress management are reducing stressful life events and coping with stress in healthy ways.  Techniques for reducing stressful life events include the following:  Stress identification. Self-monitor for stress and identify what causes stress for you. These skills may help you to avoid some stressful events.  Time management. Set your priorities, keep a calendar of events, and learn to say "no." These tools can help you avoid making too many commitments. Techniques for coping with stress include the following:  Rethinking the problem. Try to think realistically about stressful events rather than ignoring them or overreacting. Try to find the positives in a stressful situation rather than focusing on the negatives.  Exercise. Physical exercise can release both physical and emotional tension. The key is to find a form of exercise you enjoy and do it regularly.  Relaxation techniques. These relax the body and mind. Examples include yoga, meditation, tai chi, biofeedback, deep  breathing, progressive muscle relaxation, listening to music, being out in nature, journaling, and other hobbies. Again, the key is to find one or more that you enjoy and can do  regularly.  Healthy lifestyle. Eat a balanced diet, get plenty of sleep, and do not smoke. Avoid using alcohol or drugs to relax.  Strong support network. Spend time with family, friends, or other people you enjoy being around.Express your feelings and talk things over with someone you trust. Counseling or talktherapy with a mental health professional may be helpful if you are having difficulty managing stress on your own. Medicine is typically not recommended for the treatment of stress.Talk to your health care provider if you think you need medicine for symptoms of stress. HOME CARE INSTRUCTIONS  Keep all follow-up visits as directed by your health care provider.  Take all medicines as directed by your health care provider. SEEK MEDICAL CARE IF:  Your symptoms get worse or you start having new symptoms.  You feel overwhelmed by your problems and can no longer manage them on your own. SEEK IMMEDIATE MEDICAL CARE IF:  You feel like hurting yourself or someone else.   This information is not intended to replace advice given to you by your health care provider. Make sure you discuss any questions you have with your health care provider.   Document Released: 06/10/2001 Document Revised: 01/05/2015 Document Reviewed: 08/09/2013 Elsevier Interactive Patient Education [employer].

## 2016-01-25 NOTE — MAU Note (Signed)
Pt states her "chest pain" started about 4 days ago when she started taking zoloft.  Pt states she has also been having some elevated BP's, heart racing, and headaches.  Pt states she just wants to get thing checked out to be healthy when baby is discharged from NICU.

## 2016-01-25 NOTE — MAU Provider Note (Signed)
History     CSN: [id number]  Arrival date and time: 01/25/16 3428   First Provider Initiated Contact with Patient 01/25/16 1019      No chief complaint on file.  HPI Heidi Landry 30 y.o. [id number] @Unknown  presents to MAU complaining of chest pain.  She believes it is caused from zoloft.  Although she notes having the same type of pain episodically prior to beginning zoloft.  It tends to happen when she is thinking about things that cause her worry.  It lasts up to 10 seconds.  It may occur 2-3 times per day or not at all.  It started a few days ago.  It improves after taking a deep breath.  It is sharp and located right in the center of her chest.  She denies radiation, nausea, vomiting, abdominal pain, diarrhea.  She does note that she has not cried or yelled at her husband since she started taking the medication.   OB History    Gravida Para Term Preterm AB TAB SAB Ectopic Multiple Living   1 1  1      0 1      Past Medical History  Diagnosis Date  . Hypertension   . Edema   . Chronic back pain   . Tendonitis   . IBS (irritable bowel syndrome)   . Degenerative disc disease   . Spondylosis   . Gestational diabetes mellitus, antepartum     Past Surgical History  Procedure Laterality Date  . Cholecystectomy    . Cesarean section N/A 12/08/2015    Procedure: CESAREAN SECTION;  Surgeon: Jonnie Kind, MD;  Location: [facility];  Service: Obstetrics;  Laterality: N/A;    Family History  Problem Relation Age of Onset  . Fibromyalgia Mother   . Cancer Father     lung, colon   . Hernia Father   . Thyroid disease Paternal Aunt   . Diabetes Maternal Grandmother   . Hypertension Maternal Grandmother   . Cancer Paternal Uncle   . Brain cancer Paternal Uncle   . Throat cancer Paternal Uncle     Social History  Substance Use Topics  . Smoking status: Former Smoker -- 1.00 packs/day for 9 years    Types: Cigarettes    Start date: 05/25/2006    Quit date: 01/04/2016  .  Smokeless tobacco: Never Used  . Alcohol Use: No    Allergies: No Known Allergies  Prescriptions prior to admission  Medication Sig Dispense Refill Last Dose  . cetirizine (ZYRTEC) 10 MG tablet Take 10 mg by mouth daily.    Taking  . CONDYLOX 0.5 % gel APPLY TOPICALLY TO ACCESSIBLE WARTS TWICE DAILY. 3.5 g 0 Taking  . docusate sodium (COLACE) 100 MG capsule Take 100 mg by mouth 2 (two) times daily.   Taking  . ibuprofen (ADVIL,MOTRIN) 600 MG tablet Take 1 tablet (600 mg total) by mouth every 6 (six) hours. 30 tablet 0 Taking  . metoprolol succinate (TOPROL-XL) 25 MG 24 hr tablet Take 1 tablet (25 mg total) by mouth daily. Take with or immediately following a meal. 30 tablet 1 Taking  . norethindrone (ORTHO MICRONOR) 0.35 MG tablet Take 1 tablet (0.35 mg total) by mouth daily. 1 Package 11 Taking  . omeprazole (PRILOSEC) 20 MG capsule Take 1 capsule (20 mg total) by mouth daily. 1 tablet a day 30 capsule 6   . Prenatal Vit-Fe Fumarate-FA (MULTIVITAMIN-PRENATAL) 27-0.8 MG TABS tablet Take 1 tablet by mouth daily at 12 noon.  Taking  . sertraline (ZOLOFT) 50 MG tablet Take 1 tablet (50 mg total) by mouth daily. Begin with 1/2 daily for first 4 days.  Then increase to full tab. 30 tablet 0 Taking    ROS Pertinent ROS in HPI.  All other systems are negative.   Physical Exam   Blood pressure 119/60, pulse 87, temperature 98.6 F (37 C), temperature source Oral, resp. rate 18, SpO2 98 %, not currently breastfeeding.  Physical Exam  Constitutional: She is oriented to person, place, and time. She appears well-developed and well-nourished. No distress.  HENT:  Head: Normocephalic and atraumatic.  Eyes: Conjunctivae and EOM are normal.  Neck: Normal range of motion. Neck supple.  Cardiovascular: Normal rate, regular rhythm and normal heart sounds.   Respiratory: Effort normal and breath sounds normal. No respiratory distress.  GI: Soft.  Musculoskeletal: Normal range of motion.   Neurological: She is alert and oriented to person, place, and time.  Skin: Skin is warm and dry.  Psychiatric: She has a normal mood and affect. Her behavior is normal.    MAU Course  Procedures  MDM EKG ordered to eval.   EKG normal.  VS normal.  Pt not experiencing pain presently   Assessment and Plan  A:  1. Other chest pain   2. Stress    P: Discharge to home Chest pain unlikely related to zoloft.  More likely stress related.   Furthermore, pt does seem to be improving on this medication.  Continue and f/u with PCP or OB/GYN.   Patient may return to MAU as needed or if her condition were to change or worsen   Paticia Stack 01/25/2016, 11:08 AM

## 2016-01-30 ENCOUNTER — Ambulatory Visit (INDEPENDENT_AMBULATORY_CARE_PROVIDER_SITE_OTHER): Payer: Medicaid Other | Admitting: Pediatrics

## 2016-01-30 ENCOUNTER — Encounter: Payer: Self-pay | Admitting: Pediatrics

## 2016-01-30 VITALS — BP 135/72 | HR 78 | Temp 98.8°F | Ht 62.0 in | Wt 310.8 lb

## 2016-01-30 DIAGNOSIS — F53 Postpartum depression: Secondary | ICD-10-CM

## 2016-01-30 DIAGNOSIS — M5441 Lumbago with sciatica, right side: Secondary | ICD-10-CM | POA: Diagnosis not present

## 2016-01-30 DIAGNOSIS — I10 Essential (primary) hypertension: Secondary | ICD-10-CM

## 2016-01-30 DIAGNOSIS — G4733 Obstructive sleep apnea (adult) (pediatric): Secondary | ICD-10-CM | POA: Diagnosis not present

## 2016-01-30 DIAGNOSIS — O99345 Other mental disorders complicating the puerperium: Secondary | ICD-10-CM

## 2016-01-30 NOTE — Progress Notes (Signed)
Subjective:    Patient ID: Heidi Landry, female    DOB: December 16, 1986, 30 y.o.   MRN: [id number]  CC: New Patient (Initial Visit) follow up multiple med problems  HPI: Heidi Landry is a 30 y.o. female presenting for New Patient (Initial Visit)  Had placental abruption 12/08/2015, Heidi Landry, born at 29wks. Comes home this weekend Started on zoloft for emotional lability ongoing last few weeks. Has been helping, on it past week. Has had loose stools 2-3 times a day since starting. Had 6-7 weeks of bleeding post-partum, changing pads regularly. Depression as a teenager, bad relationship with dad, relationship now improved  Back pain: fell in 2013 7 ft off porch, says she had a crushed vertebral body, has been bothering her since then  HTN: was on labetalol during pregnancy, metoprolol before and after pregnancy  OSA: uses CPAP machine, started 1 month ago  Nutrition: says she eats one or two meals a day      Depression screen [facility] 2/9 01/30/2016 09/06/2015  Decreased Interest 0 0  Down, Depressed, Hopeless 0 0  PHQ - 2 Score 0 0     Relevant past medical, surgical, family and social history reviewed and updated as indicated. Interim medical history since our last visit reviewed. Allergies and medications reviewed and updated.    ROS: Per HPI unless specifically indicated above  History  Smoking status  . Former Smoker -- 1.00 packs/day for 9 years  . Types: Cigarettes  . Start date: 05/25/2006  . Quit date: 01/04/2016  Smokeless tobacco  . Never Used    Past Medical History Patient Active Problem List   Diagnosis Date Noted  . Placental abruption in third trimester 12/07/2015  . Gestational diabetes mellitus, class A2/B 08/08/2015  . Rubella non-immune status, antepartum 07/18/2015  . Supervision of other high-risk pregnancy 07/17/2015  . Chronic hypertension during pregnancy, antepartum 07/17/2015  . Maternal morbid obesity, antepartum ([place]) 07/17/2015  . Smoker  07/17/2015  . Morbid obesity with BMI of 50.0-59.9, adult ([place]) 12/25/2014  . Thoracic spine fracture ([facility]) 04/13/2012    Current Outpatient Prescriptions  Medication Sig Dispense Refill  . cetirizine (ZYRTEC) 10 MG tablet Take 10 mg by mouth daily.     Marland Kitchen docusate sodium (COLACE) 100 MG capsule Take 100 mg by mouth 2 (two) times daily.    Marland Kitchen ibuprofen (ADVIL,MOTRIN) 600 MG tablet Take 1 tablet (600 mg total) by mouth every 6 (six) hours. 30 tablet 0  . metoprolol succinate (TOPROL-XL) 25 MG 24 hr tablet Take 1 tablet (25 mg total) by mouth daily. Take with or immediately following a meal. 30 tablet 1  . norethindrone (ORTHO MICRONOR) 0.35 MG tablet Take 1 tablet (0.35 mg total) by mouth daily. 1 Package 11  . omeprazole (PRILOSEC) 20 MG capsule Take 1 capsule (20 mg total) by mouth daily. 1 tablet a day 30 capsule 6  . Prenatal Vit-Fe Fumarate-FA (MULTIVITAMIN-PRENATAL) 27-0.8 MG TABS tablet Take 1 tablet by mouth daily at 12 noon.    . sertraline (ZOLOFT) 50 MG tablet Take 1 tablet (50 mg total) by mouth daily. Begin with 1/2 daily for first 4 days.  Then increase to full tab. 30 tablet 0  . CONDYLOX 0.5 % gel APPLY TOPICALLY TO ACCESSIBLE WARTS TWICE DAILY. (Patient not taking: Reported on 01/30/2016) 3.5 g 0   No current facility-administered medications for this visit.       Objective:    BP 135/72 mmHg  Pulse 78  Temp(Src) 98.8 F (37.1  C) (Oral)  Ht 5' 2"  (1.575 m)  Wt 310 lb 12.8 oz (140.978 kg)  BMI 56.83 kg/m2  Wt Readings from Last 3 Encounters:  01/30/16 310 lb 12.8 oz (140.978 kg)  01/22/16 319 lb (144.697 kg)  01/17/16 322 lb (146.058 kg)     Gen: NAD, alert, cooperative with exam, NCAT EYES: EOMI, no scleral injection or icterus ENT:  TMs pearly gray b/l, OP without erythema LYMPH: no cervical LAD CV: NRRR, normal S1/S2, no murmur, distal pulses 2+ b/l Resp: CTABL, no wheezes, normal WOB Abd: +BS, soft, NTND. no guarding or organomegaly Ext: No edema,  warm Neuro: Alert and oriented, strength equal b/l UE and LE, coordination grossly normal, sensation intact b/l LE MSK: no point tenderness over spine     Assessment & Plan:    Harjot was seen today for new patient (initial visit), follow up multiple med problems.  Diagnoses and all orders for this visit:  Bilateral low back pain with right-sided sciatica -     Ambulatory referral to Orthopedic Surgery  Essential hypertension On metoprolol, has been prior to pregnancy. Adequate control today, continue med.  Obstructive sleep apnea Continue CPAP machine  Post partum depression Symptoms improving control, feels safe at home. Continue zoloft.    Follow up plan: Return in about 6 weeks (around 03/12/2016).  Assunta Found, MD [facility] 01/30/2016, 2:53 PM

## 2016-02-19 ENCOUNTER — Ambulatory Visit (INDEPENDENT_AMBULATORY_CARE_PROVIDER_SITE_OTHER): Payer: Medicaid Other | Admitting: Obstetrics & Gynecology

## 2016-02-19 ENCOUNTER — Encounter: Payer: Self-pay | Admitting: Obstetrics & Gynecology

## 2016-02-19 VITALS — BP 140/80 | HR 72 | Wt 318.0 lb

## 2016-02-19 DIAGNOSIS — F53 Postpartum depression: Secondary | ICD-10-CM

## 2016-02-19 DIAGNOSIS — O99345 Other mental disorders complicating the puerperium: Principal | ICD-10-CM

## 2016-02-19 MED ORDER — SERTRALINE HCL 50 MG PO TABS: 50.0000 mg | ORAL_TABLET | Freq: Every day | ORAL | Status: DC

## 2016-02-19 NOTE — Progress Notes (Signed)
Patient ID: Heidi Landry, female   DOB: 1986/11/19, 30 y.o.   MRN: [id number] Chief Complaint  Patient presents with  . Follow-up    Zoloft    Follow up her response to zoloft for management of postpartum depression  Pt states she is doing great, feels like this dose is good and does not wan to increase it  Had some diarrhea for about 3 weeks but now better  Will maintain therapy for 6 months total and re evlauate  Follow up 3 months or prn     Face to face time:  10 minutes  Greater than 50% of the visit time was spent in counseling and coordination of care with the patient.  The summary and outline of the counseling and care coordination is summarized in the note above.   All questions were answered.

## 2016-02-19 NOTE — Addendum Note (Signed)
Addended by: Florian Buff on: 02/19/2016 11:56 AM   Modules accepted: Orders

## 2016-03-12 ENCOUNTER — Encounter (INDEPENDENT_AMBULATORY_CARE_PROVIDER_SITE_OTHER): Payer: Self-pay

## 2016-03-12 ENCOUNTER — Encounter: Payer: Self-pay | Admitting: Pediatrics

## 2016-03-12 ENCOUNTER — Ambulatory Visit (INDEPENDENT_AMBULATORY_CARE_PROVIDER_SITE_OTHER): Payer: Medicaid Other | Admitting: Pediatrics

## 2016-03-12 VITALS — BP 135/89 | HR 95 | Temp 99.0°F | Ht 62.0 in | Wt 334.0 lb

## 2016-03-12 DIAGNOSIS — F53 Postpartum depression: Secondary | ICD-10-CM

## 2016-03-12 DIAGNOSIS — E1159 Type 2 diabetes mellitus with other circulatory complications: Secondary | ICD-10-CM | POA: Insufficient documentation

## 2016-03-12 DIAGNOSIS — I10 Essential (primary) hypertension: Secondary | ICD-10-CM

## 2016-03-12 DIAGNOSIS — Z6841 Body Mass Index (BMI) 40.0 and over, adult: Secondary | ICD-10-CM | POA: Diagnosis not present

## 2016-03-12 DIAGNOSIS — K219 Gastro-esophageal reflux disease without esophagitis: Secondary | ICD-10-CM | POA: Diagnosis not present

## 2016-03-12 DIAGNOSIS — I152 Hypertension secondary to endocrine disorders: Secondary | ICD-10-CM | POA: Insufficient documentation

## 2016-03-12 DIAGNOSIS — O99345 Other mental disorders complicating the puerperium: Secondary | ICD-10-CM

## 2016-03-12 LAB — BAYER DCA HB A1C WAIVED: HB A1C (BAYER DCA - WAIVED): 5.7 % (ref <7.0)

## 2016-03-12 MED ORDER — OMEPRAZOLE 20 MG PO CPDR: 20.0000 mg | DELAYED_RELEASE_CAPSULE | Freq: Every day | ORAL | Status: DC

## 2016-03-12 MED ORDER — METOPROLOL SUCCINATE ER 25 MG PO TB24: 25.0000 mg | ORAL_TABLET | Freq: Every day | ORAL | Status: DC

## 2016-03-12 NOTE — Progress Notes (Signed)
Subjective:    Patient ID: Heidi Landry, female    DOB: 02-14-1986, 30 y.o.   MRN: [id number]  CC: Medication check and Edema   HPI: Heidi Landry is a 30 y.o. female presenting for Medication check and Edema  When pregnant was drinking nothing but water Now not drinking anything but regular pepsi or coke. Eating more than what she usually has been On metoprolol XL for BP, has been on and off since 30yo  Smallest size she has been in last few years size 18  Baby Heidi Landry is doing well  No headache and vision changes  Depression much improved  Takes metoprolol for HTN  GER: symptoms well controlled with current meds  Not breast feeding   Depression screen [facility] 2/9 03/12/2016 01/30/2016 09/06/2015  Decreased Interest 0 0 0  Down, Depressed, Hopeless 0 0 0  PHQ - 2 Score 0 0 0     Relevant past medical, surgical, family and social history reviewed and updated as indicated. Interim medical history since our last visit reviewed. Allergies and medications reviewed and updated.    ROS: Per HPI unless specifically indicated above  History  Smoking status  . Former Smoker -- 1.00 packs/day for 9 years  . Types: Cigarettes  . Start date: 05/25/2006  . Quit date: 01/04/2016  Smokeless tobacco  . Never Used    Past Medical History Patient Active Problem List   Diagnosis Date Noted  . BMI 60.0-69.9, adult ([place]) 03/12/2016  . Esophageal reflux 03/12/2016  . Essential hypertension 03/12/2016  . Post partum depression 03/12/2016  . Placental abruption in third trimester 12/07/2015  . Gestational diabetes mellitus, class A2/B 08/08/2015  . Rubella non-immune status, antepartum 07/18/2015  . Supervision of other high-risk pregnancy 07/17/2015  . Chronic hypertension during pregnancy, antepartum 07/17/2015  . Maternal morbid obesity, antepartum ([place]) 07/17/2015  . Smoker 07/17/2015  . Morbid obesity with BMI of 50.0-59.9, adult ([place]) 12/25/2014  . Thoracic spine  fracture ([place]) 04/13/2012        Objective:    BP 170/88 mmHg  Pulse 95  Temp(Src) 99 F (37.2 C) (Oral)  Ht 5' 2"  (1.575 m)  Wt 334 lb (151.501 kg)  BMI 61.07 kg/m2  Wt Readings from Last 3 Encounters:  03/12/16 334 lb (151.501 kg)  02/19/16 318 lb (144.244 kg)  01/30/16 310 lb 12.8 oz (140.978 kg)     Gen: NAD, alert, cooperative with exam, NCAT EYES: EOMI, no scleral injection or icterus ENT:  OP without erythema LYMPH: no cervical LAD CV: NRRR, normal S1/S2, no murmur, distal pulses 2+ b/l Resp: CTABL, no wheezes, normal WOB Abd: +BS, soft, NTND.  Ext: No edema, warm Neuro: Alert and oriented, strength equal b/l UE and LE, coordination grossly normal Psych: normal affect     Assessment & Plan:    Heidi Landry was seen today for medication check and edema, follow up below problems.  Diagnoses and all orders for this visit:  BMI 60.0-69.9, adult [facility]) Nutrition counseling  Stop sodas Discussed lifestyle changes -     CMP14+EGFR -     Lipid panel -     Bayer DCA Hb A1c Waived  Gastroesophageal reflux disease, esophagitis presence not specified -     omeprazole (PRILOSEC) 20 MG capsule; Take 1 capsule (20 mg total) by mouth daily. 1 tablet a day  Essential hypertension -     metoprolol succinate (TOPROL-XL) 25 MG 24 hr tablet; Take 1 tablet (25 mg total) by mouth daily. Take  with or immediately following a meal.  Post partum depression Well controlled on zoloft. Continue.    Follow up plan: Return for 2 weeks with Tammy for nutrition and weight loss.  Assunta Found, MD [facility] 03/12/2016, 2:39 PM

## 2016-03-13 LAB — CMP14+EGFR
ALBUMIN: 4.3 g/dL (ref 3.5–5.5)
ALT: 29 IU/L (ref 0–32)
AST: 16 IU/L (ref 0–40)
Albumin/Globulin Ratio: 1.7 (ref 1.2–2.2)
Alkaline Phosphatase: 94 IU/L (ref 39–117)
BUN / CREAT RATIO: 19 (ref 8–20)
BUN: 10 mg/dL (ref 6–20)
Bilirubin Total: 0.4 mg/dL (ref 0.0–1.2)
CO2: 27 mmol/L (ref 18–29)
CREATININE: 0.53 mg/dL — AB (ref 0.57–1.00)
Calcium: 8.7 mg/dL (ref 8.7–10.2)
Chloride: 99 mmol/L (ref 96–106)
GFR, EST AFRICAN AMERICAN: 147 mL/min/{1.73_m2} (ref >59)
GFR, EST NON AFRICAN AMERICAN: 128 mL/min/{1.73_m2} (ref >59)
GLOBULIN, TOTAL: 2.6 g/dL (ref 1.5–4.5)
GLUCOSE: 67 mg/dL (ref 65–99)
Potassium: 4.1 mmol/L (ref 3.5–5.2)
Sodium: 142 mmol/L (ref 134–144)
TOTAL PROTEIN: 6.9 g/dL (ref 6.0–8.5)

## 2016-03-13 LAB — LIPID PANEL
CHOL/HDL RATIO: 3.9 ratio (ref 0.0–4.4)
Cholesterol, Total: 154 mg/dL (ref 100–199)
HDL: 39 mg/dL — ABNORMAL LOW (ref >39)
LDL CALC: 82 mg/dL (ref 0–99)
Triglycerides: 166 mg/dL — ABNORMAL HIGH (ref 0–149)
VLDL CHOLESTEROL CAL: 33 mg/dL (ref 5–40)

## 2016-03-19 ENCOUNTER — Ambulatory Visit (INDEPENDENT_AMBULATORY_CARE_PROVIDER_SITE_OTHER): Payer: Medicaid Other | Admitting: Pharmacist

## 2016-03-19 ENCOUNTER — Encounter: Payer: Self-pay | Admitting: Pharmacist

## 2016-03-19 DIAGNOSIS — R600 Localized edema: Secondary | ICD-10-CM

## 2016-03-19 MED ORDER — FUROSEMIDE 20 MG PO TABS: 20.0000 mg | ORAL_TABLET | Freq: Every day | ORAL | Status: DC | PRN

## 2016-03-19 MED ORDER — CEPHALEXIN 500 MG PO CAPS: 500.0000 mg | ORAL_CAPSULE | Freq: Three times a day (TID) | ORAL | Status: DC

## 2016-03-19 NOTE — Progress Notes (Signed)
Patient ID: Heidi Landry, female   DOB: 05/22/1986, 30 y.o.   MRN: [id number]  Subjective:     Heidi Landry is a 30 y.o. female who I am asked to see in consultation for evaluation and treatment of obesity. Patient cites health, increased physical ability, self-image as reasons for wanting to lose weight. She is also concerned about cellulitis / infection to her abdominal area / panniculus.  She states she has a severe infection in 2013 in this area which required hospitalization and IV ABX. She also c/o occasional swelling in hands and ankles  Obesity History Lowest adult weight: 250 Highest adult weight: 335 Lost down to 310# while pregnant when she stopped drinking soda and switched to water   History of Weight Loss Efforts Tried Atkins Diet - lost about 30 lbs Worked out at [employer] - lost [employer] in past and lost a "little bit" of weight.   Current Exercise Habits none  Current Eating Habits Number of regular meals per day: 2 Number of snacking episodes per day: 4 Who shops for food? patient Who prepares food? patient Who eats with patient? patient and husband Eating precipitated by stress? yes -   Guilt feelings associated with eating? yes -    Other Potential Contributing Factors Use of alcohol: average 0 drinks/week Use of medications that may cause weight gain estrogens (BCPs)  Psych History: depression Comorbidities: GERD and hypertension The following portions of the patient's history were reviewed and updated as appropriate: allergies, current medications, past family history, past medical history, past social history, past surgical history and problem list.      Objective:    BP 134/68 mmHg  Pulse 92  Ht 5' 2"  (1.575 m)  Wt 335 lb 12 oz (152.295 kg)  BMI 61.39 kg/m2 Body mass index is 61.39 kg/(m^2).   Trace edema bilaterally Dr Evette Doffing comes in to evaluate panniculus.  Notes a light redness in color and mild warmth to skin    Assessment:     Obesity with BMI and comorbidities as noted above. Patient readiness to commit to diet and activity changes: good Barriers to weight loss: stress ( ) and time limitations     Plan:    1. Diagnostic studies to rule out secondary causes of obesity: will get at next visit - TSH / LH, FSH, DHEA-S, 17 hydroxy progesterone, testosterone 2.  Diet interventions: low calorie (1000 kCal/d) deficit diet Risks of dieting were reviewed, including fatigue, temporary hair loss, gallstone formation, gout, and with very low calorie diets, electrolyte abnormalities, nutrient inadequacies, and loss of lean body mass. Proper food choices reviewed: yes Preparation techniques reviewed: yes Careful meal planning; avoiding ad hoc eating: yes Stimulus control to control unhealthy eating: yes Handouts given: samples diet given 4. Exercise intervention:  Informal measures, e.g. taking stairs instead of elevator: yes Formal exercise regimen: yes -   5. Other behavioral treatment: stress management 6. Rx for cephalexin 532m 1 cap tid for 7 days.  Furosemide 289mtake as needed daily for edema. 7.  Follow up: 4 weeks and as needed.    TaCherre RobinsPharmD, CPP

## 2016-03-27 ENCOUNTER — Other Ambulatory Visit: Payer: Self-pay

## 2016-03-27 ENCOUNTER — Ambulatory Visit (INDEPENDENT_AMBULATORY_CARE_PROVIDER_SITE_OTHER): Payer: Medicaid Other

## 2016-03-27 ENCOUNTER — Encounter: Payer: Self-pay | Admitting: Cardiology

## 2016-03-27 ENCOUNTER — Ambulatory Visit (INDEPENDENT_AMBULATORY_CARE_PROVIDER_SITE_OTHER): Payer: Medicaid Other | Admitting: Cardiology

## 2016-03-27 VITALS — BP 135/79 | HR 89 | Ht 62.0 in | Wt 343.6 lb

## 2016-03-27 DIAGNOSIS — G4733 Obstructive sleep apnea (adult) (pediatric): Secondary | ICD-10-CM | POA: Diagnosis not present

## 2016-03-27 DIAGNOSIS — R0602 Shortness of breath: Secondary | ICD-10-CM

## 2016-03-27 DIAGNOSIS — R600 Localized edema: Secondary | ICD-10-CM

## 2016-03-27 DIAGNOSIS — R002 Palpitations: Secondary | ICD-10-CM | POA: Diagnosis not present

## 2016-03-27 LAB — ECHOCARDIOGRAM COMPLETE
HEIGHTINCHES: 62 in
Weight: 5497.6 oz

## 2016-03-27 NOTE — Progress Notes (Signed)
Patient ID: Heidi Landry, female   DOB: 13-Nov-1986, 30 y.o.   MRN: [id number]     Clinical Summary Ms. Sauseda is a 30 y.o.female seen today for follow up of the following medical problems.    1. Palpitations - 48 hr holter with no symptoms reported and no arrhythmias - denies any recent palpitations. She has done well since starting beta blocker and limiting caffeine intake  2. OSA  - compliant with CPAP  3. Weight gain - weights Jan 2017 was 320s, in Feb 310s-320. Today we have her at 343 lbs.  - has had some increased leg swelling. Notes some recent SOB/DOE. - just started on lasix by pcp   - she does report some poor eating habits as well    SH: last visit she was pregnant, she had a litlle boy in December named [place].    Past Medical History  Diagnosis Date  . Hypertension   . Edema   . Chronic back pain   . Tendonitis   . IBS (irritable bowel syndrome)   . Degenerative disc disease   . Spondylosis   . Sleep apnea   . Gestational diabetes mellitus, antepartum   . Post partum depression   . Chest pain   . Anxiety   . Panic attacks   . Carpal tunnel syndrome, bilateral      No Known Allergies   Current Outpatient Prescriptions  Medication Sig Dispense Refill  . cephALEXin (KEFLEX) 500 MG capsule Take 1 capsule (500 mg total) by mouth 3 (three) times daily. 21 capsule 0  . cetirizine (ZYRTEC) 10 MG tablet Take 10 mg by mouth daily.     . CONDYLOX 0.5 % gel APPLY TOPICALLY TO ACCESSIBLE WARTS TWICE DAILY. 3.5 g 0  . docusate sodium (COLACE) 100 MG capsule Take 100 mg by mouth 2 (two) times daily.    . furosemide (LASIX) 20 MG tablet Take 1 tablet (20 mg total) by mouth daily as needed. 30 tablet 1  . ibuprofen (ADVIL,MOTRIN) 600 MG tablet Take 1 tablet (600 mg total) by mouth every 6 (six) hours. 30 tablet 0  . metoprolol succinate (TOPROL-XL) 25 MG 24 hr tablet Take 1 tablet (25 mg total) by mouth daily. Take with or immediately following a meal. 30 tablet 6   . norethindrone (ORTHO MICRONOR) 0.35 MG tablet Take 1 tablet (0.35 mg total) by mouth daily. 1 Package 11  . omeprazole (PRILOSEC) 20 MG capsule Take 1 capsule (20 mg total) by mouth daily. 1 tablet a day 30 capsule 6  . Prenatal Vit-Fe Fumarate-FA (MULTIVITAMIN-PRENATAL) 27-0.8 MG TABS tablet Take 1 tablet by mouth daily at 12 noon.    . sertraline (ZOLOFT) 50 MG tablet Take 1 tablet (50 mg total) by mouth daily. Begin with 1/2 daily for first 4 days.  Then increase to full tab. 30 tablet 11   No current facility-administered medications for this visit.     Past Surgical History  Procedure Laterality Date  . Cholecystectomy    . Cesarean section N/A 12/08/2015    Procedure: CESAREAN SECTION;  Surgeon: Jonnie Kind, MD;  Location: [facility];  Service: Obstetrics;  Laterality: N/A;     No Known Allergies    Family History  Problem Relation Age of Onset  . Fibromyalgia Mother   . Cancer Father     lung, colon   . Hernia Father   . Thyroid disease Paternal Aunt   . Diabetes Maternal Grandmother   . Hypertension Maternal Grandmother   .  Cancer Paternal Uncle   . Brain cancer Paternal Uncle   . Throat cancer Paternal Uncle      Social History Ms. Figgs reports that she quit smoking about 2 months ago. Her smoking use included Cigarettes. She started smoking about 9 years ago. She has a 9 pack-year smoking history. She has never used smokeless tobacco. Ms. Goodenow reports that she does not drink alcohol.   Review of Systems CONSTITUTIONAL: No weight loss, fever, chills, weakness or fatigue.  HEENT: Eyes: No visual loss, blurred vision, double vision or yellow sclerae.No hearing loss, sneezing, congestion, runny nose or sore throat.  SKIN: No rash or itching.  CARDIOVASCULAR: per HPI RESPIRATORY: No shortness of breath, cough or sputum.  GASTROINTESTINAL: No anorexia, nausea, vomiting or diarrhea. No abdominal pain or blood.  GENITOURINARY: No burning on urination, no  polyuria NEUROLOGICAL: No headache, dizziness, syncope, paralysis, ataxia, numbness or tingling in the extremities. No change in bowel or bladder control.  MUSCULOSKELETAL: No muscle, back pain, joint pain or stiffness.  LYMPHATICS: No enlarged nodes. No history of splenectomy.  PSYCHIATRIC: No history of depression or anxiety.  ENDOCRINOLOGIC: No reports of sweating, cold or heat intolerance. No polyuria or polydipsia.  Marland Kitchen   Physical Examination Filed Vitals:   03/27/16 1446  BP: 135/79  Pulse: 89   Filed Vitals:   03/27/16 1446  Height: 5' 2"  (1.575 m)  Weight: 343 lb 9.6 oz (155.856 kg)    Gen: resting comfortably, no acute distress HEENT: no scleral icterus, pupils equal round and reactive, no palptable cervical adenopathy,  CV: RRR, no m/r/g no jvd Resp: Clear to auscultation bilaterally GI: abdomen is soft, non-tender, non-distended, normal bowel sounds, no hepatosplenomegaly MSK: extremities are warm, 1+ bilateral LE edema.  Skin: warm, no rash Neuro:  no focal deficits Psych: appropriate affect   Diagnostic Studies 10/2015 Sleep study: severe OSA  02/2015 48 hr holter No arrhythmias, no symptoms reported  Assessment and Plan  1. Palpitations - no significant arrhythmias on holter - symptoms improved on beta blocker adn limited caffeine - continue to monitor  2. OSA  - continue CPAP   3. LE edema - significant weight gain and edema over the last few months. We will obtain echo to evaluate for any cardiac dysfunction     F/u 6 months   Arnoldo Lenis, M.D.

## 2016-03-27 NOTE — Patient Instructions (Signed)
Your physician wants you to follow-up in: [place] DR. BRANCH You will receive a reminder letter in the mail two months in advance. If you don't receive a letter, please call our office to schedule the follow-up appointment.  Your physician recommends that you continue on your current medications as directed. Please refer to the Current Medication list given to you today.  Your physician has requested that you have an echocardiogram. Echocardiography is a painless test that uses sound waves to create images of your heart. It provides your doctor with information about the size and shape of your heart and how well your heart's chambers and valves are working. This procedure takes approximately one hour. There are no restrictions for this procedure.  Thank you for choosing [facility]!!

## 2016-04-02 ENCOUNTER — Telehealth: Payer: Self-pay | Admitting: *Deleted

## 2016-04-02 NOTE — Telephone Encounter (Signed)
-----   Message from Arnoldo Lenis, MD sent at 03/31/2016 12:00 PM EDT ----- Echo looks good, her heart function is normal. Nothing from heart standpoint to explain her significant weight gain   J BrancH MD

## 2016-04-02 NOTE — Telephone Encounter (Signed)
Pt aware, routed to pcp 

## 2016-04-18 ENCOUNTER — Ambulatory Visit (INDEPENDENT_AMBULATORY_CARE_PROVIDER_SITE_OTHER): Payer: Medicaid Other | Admitting: Pharmacist

## 2016-04-18 ENCOUNTER — Encounter (INDEPENDENT_AMBULATORY_CARE_PROVIDER_SITE_OTHER): Payer: Self-pay

## 2016-04-18 VITALS — BP 124/88 | HR 90 | Ht 62.0 in | Wt 332.0 lb

## 2016-04-18 DIAGNOSIS — R140 Abdominal distension (gaseous): Secondary | ICD-10-CM | POA: Diagnosis not present

## 2016-04-18 DIAGNOSIS — E669 Obesity, unspecified: Secondary | ICD-10-CM

## 2016-04-18 DIAGNOSIS — L659 Nonscarring hair loss, unspecified: Secondary | ICD-10-CM

## 2016-04-18 MED ORDER — FUROSEMIDE 20 MG PO TABS: 20.0000 mg | ORAL_TABLET | Freq: Every day | ORAL | Status: DC | PRN

## 2016-04-18 NOTE — Progress Notes (Signed)
Patient ID: Heidi Landry, female   DOB: 02-May-1986, 30 y.o.   MRN: [id number]   Subjective:     Heidi Landry is a 30 y.o. female is here today for follow up diet / weight loss / obesity.  Patient cites health, increased physical ability, self-image as reasons for wanting to lose weight.  She is also concerned about her stool changes.  She states since she has been taking prenatal vitamins (her son is 7 months old) she has had more frequent stools (increased from every other day to 2 -3 times per day.  She also notes that her BMs are very smelly.   Her father had colon CA at age 71yo and she is worried.    She has been taking furosemide 44m daily for swelling and edema has improved but she states that somedays she feels that she needs more.   Obesity History Lowest adult weight: 250 Highest adult weight: 335 Lost down to 310# while pregnant when she stopped drinking soda and switched to water   History of Weight Loss Efforts Tried Atkins Diet - lost about 30 lbs Worked out at [employer]- lost [employer] past and lost a "little bit" of weight.   Current Exercise Habits walking and Zumba - about 2-3 times per week  Current Eating Habits Number of regular meals per day: 3 Number of snacking episodes per day: 4 Who shops for food? patient Who prepares food? patient Who eats with patient? patient and husband Eating precipitated by stress? yes -   Guilt feelings associated with eating? yes -   Patient has been limiting portion sizes and has stopped drinking sodas.  She is eating leaner proteins and has increase vegetables.   Other Potential Contributing Factors Use of alcohol: average 0 drinks/week Use of medications that may cause weight gain estrogens (BCPs)  Psych History: depression Comorbidities: GERD and hypertension The following portions of the patient's history were reviewed and updated as appropriate: allergies, current medications, past family history, past medical  history, past social history, past surgical history and problem list.      Objective:    BP 124/88 mmHg  Pulse 90  Ht 5' 2"  (1.575 m)  Wt 332 lb (150.594 kg)  BMI 60.71 kg/m2 Body mass index is 60.71 kg/(m^2).   Trace edema bilaterally Dr VEvette Doffingcomes in to evaluate panniculus.  Notes a light redness in color and mild warmth to skin    Assessment:    Obesity with BMI and comorbidities as noted above - patient weight has decreased signficantly with recent changes - has lost 12# Patient readiness to commit to diet and activity changes: good Barriers to weight loss: stress ( ) and time limitations     Plan:    1. Diagnostic studies to rule out secondary causes of obesity:  TSH / LH, FSH, DHEA-S, 17 hydroxy progesterone, testosterone 2. Continue current diet interventions: low calorie (1000 kCal/d) deficit diet 3. Increase Exercise goal is 150 minutes per week  Informal measures, e.g. taking stairs instead of elevator: yes Formal exercise regimen: yes -    4. Increase Furosemide 23m-4089make as needed daily for edema. 5.  Patient to limit caffeine, dairy and cheese to see if stool / BM improves.  Also advised to try daily probiotic.  Gave FOBT to check stool 6.  Hold prenatal vitamin 7.  Follow up: 4 weeks and as needed.    TamCherre RobinsharmD, CPP

## 2016-04-18 NOTE — Patient Instructions (Signed)
Continue with diet - great job! You lost about 12 pounds over the last month.  Continue exercising - goal is to exercise 30 minutes daily.   Increase vegetables and fruits - goal is 5 per day. Choose whole grains, whole grained rice, beans for protein (1/2), whole wheat / grained bread.  Eat lean meats and proteins.

## 2016-04-23 LAB — TESTOSTERONE, FREE: TESTOSTERONE FREE: 4.4 pg/mL — AB (ref 0.0–4.2)

## 2016-04-23 LAB — CBC WITH DIFFERENTIAL/PLATELET
BASOS: 0 %
Basophils Absolute: 0 10*3/uL (ref 0.0–0.2)
EOS (ABSOLUTE): 0.2 10*3/uL (ref 0.0–0.4)
Eos: 2 %
HEMATOCRIT: 37.3 % (ref 34.0–46.6)
Hemoglobin: 12 g/dL (ref 11.1–15.9)
Immature Grans (Abs): 0 10*3/uL (ref 0.0–0.1)
Immature Granulocytes: 0 %
Lymphocytes Absolute: 2.5 10*3/uL (ref 0.7–3.1)
Lymphs: 21 %
MCH: 25.5 pg — AB (ref 26.6–33.0)
MCHC: 32.2 g/dL (ref 31.5–35.7)
MCV: 79 fL (ref 79–97)
MONOS ABS: 0.6 10*3/uL (ref 0.1–0.9)
Monocytes: 5 %
NEUTROS ABS: 8.5 10*3/uL — AB (ref 1.4–7.0)
Neutrophils: 72 %
PLATELETS: 243 10*3/uL (ref 150–379)
RBC: 4.7 x10E6/uL (ref 3.77–5.28)
RDW: 15.9 % — AB (ref 12.3–15.4)
WBC: 11.9 10*3/uL — ABNORMAL HIGH (ref 3.4–10.8)

## 2016-04-23 LAB — TSH: TSH: 1.29 u[IU]/mL (ref 0.450–4.500)

## 2016-04-23 LAB — 17-HYDROXYPROGESTERONE: 17-Hydroxyprogesterone: 14 ng/dL

## 2016-04-23 LAB — FSH/LH
FSH: 3.7 m[IU]/mL
LH: 4.6 m[IU]/mL

## 2016-04-23 LAB — DHEA-SULFATE, SERUM: DHEA-Sulfate, LCMS: 87 ug/dL

## 2016-04-30 ENCOUNTER — Other Ambulatory Visit: Payer: Medicaid Other

## 2016-04-30 DIAGNOSIS — R140 Abdominal distension (gaseous): Secondary | ICD-10-CM

## 2016-05-01 LAB — FECAL OCCULT BLOOD, IMMUNOCHEMICAL: FECAL OCCULT BLD: NEGATIVE

## 2016-05-04 ENCOUNTER — Other Ambulatory Visit: Payer: Self-pay | Admitting: Pharmacist

## 2016-05-05 ENCOUNTER — Telehealth: Payer: Self-pay | Admitting: Pharmacist

## 2016-05-05 MED ORDER — FUROSEMIDE 20 MG PO TABS: 20.0000 mg | ORAL_TABLET | Freq: Every day | ORAL | Status: DC | PRN

## 2016-05-05 NOTE — Telephone Encounter (Signed)
Patient notified about furosemide rx  appt for Dr Dettinger made for bowel changes / concerns

## 2016-05-08 ENCOUNTER — Ambulatory Visit (INDEPENDENT_AMBULATORY_CARE_PROVIDER_SITE_OTHER): Payer: Medicaid Other | Admitting: Family Medicine

## 2016-05-08 ENCOUNTER — Encounter: Payer: Self-pay | Admitting: Family Medicine

## 2016-05-08 VITALS — BP 131/84 | HR 88 | Temp 98.0°F | Ht 62.0 in | Wt 344.6 lb

## 2016-05-08 DIAGNOSIS — R197 Diarrhea, unspecified: Secondary | ICD-10-CM | POA: Diagnosis not present

## 2016-05-08 NOTE — Progress Notes (Signed)
BP 131/84 mmHg  Pulse 88  Temp(Src) 98 F (36.7 C) (Oral)  Ht 5' 2"  (1.575 m)  Wt 344 lb 9.6 oz (156.31 kg)  BMI 63.01 kg/m2  LMP 04/15/2016 (Approximate)   Subjective:    Patient ID: Heidi Landry, female    DOB: 1986-07-02, 30 y.o.   MRN: [id number]  HPI: Heidi Landry is a 30 y.o. female presenting on 05/08/2016 for Bowel changes   HPI Intermittent diarrhea Patient has been having persistent intermittent diarrhea over the past 4-5 months. She has been having 2-6 bowel movements per day. Some are loose and large and some are just a little water and some are more firm. She denies any blood in her stools. She also just recently had a Hemoccult and a bunch of labs done and they were all negative. She denies any fevers or chills or shortness of breath or wheezing. She denies any abdominal pain or complaints. She denies any pain with stools. She does also have a lot of gas. She also says her stools have been changed in color and sometimes they're lighter sometimes they are darker. They also have a very significant odor to them as well.  Relevant past medical, surgical, family and social history reviewed and updated as indicated. Interim medical history since our last visit reviewed. Allergies and medications reviewed and updated.  Review of Systems  Constitutional: Negative for fever and chills.  HENT: Negative for congestion, ear discharge and ear pain.   Eyes: Negative for redness and visual disturbance.  Respiratory: Negative for chest tightness and shortness of breath.   Cardiovascular: Negative for chest pain and leg swelling.  Gastrointestinal: Positive for diarrhea. Negative for nausea, vomiting, abdominal pain, constipation and blood in stool.  Genitourinary: Negative for dysuria and difficulty urinating.  Musculoskeletal: Negative for back pain and gait problem.  Skin: Negative for rash.  Neurological: Negative for light-headedness and headaches.  Psychiatric/Behavioral:  Negative for behavioral problems and agitation.  All other systems reviewed and are negative.   Per HPI unless specifically indicated above     Medication List       This list is accurate as of: 05/08/16  1:40 PM.  Always use your most recent med list.               cetirizine 10 MG tablet  Commonly known as:  ZYRTEC  Take 10 mg by mouth daily.     CONDYLOX 0.5 % gel  Generic drug:  podofilox  APPLY TOPICALLY TO ACCESSIBLE WARTS TWICE DAILY.     docusate sodium 100 MG capsule  Commonly known as:  COLACE  Take 100 mg by mouth 2 (two) times daily. Reported on 05/08/2016     furosemide 20 MG tablet  Commonly known as:  LASIX  Take 1-2 tablets (20-40 mg total) by mouth daily as needed.     ibuprofen 600 MG tablet  Commonly known as:  ADVIL,MOTRIN  Take 1 tablet (600 mg total) by mouth every 6 (six) hours.     metoprolol succinate 25 MG 24 hr tablet  Commonly known as:  TOPROL-XL  Take 1 tablet (25 mg total) by mouth daily. Take with or immediately following a meal.     norethindrone 0.35 MG tablet  Commonly known as:  ORTHO MICRONOR  Take 1 tablet (0.35 mg total) by mouth daily.     omeprazole 20 MG capsule  Commonly known as:  PRILOSEC  Take 1 capsule (20 mg total) by mouth daily. 1 tablet a  day     sertraline 50 MG tablet  Commonly known as:  ZOLOFT  Take 1 tablet (50 mg total) by mouth daily. Begin with 1/2 daily for first 4 days.  Then increase to full tab.     vitamin C 1000 MG tablet  Take 1,000 mg by mouth daily.           Objective:    BP 131/84 mmHg  Pulse 88  Temp(Src) 98 F (36.7 C) (Oral)  Ht 5' 2"  (1.575 m)  Wt 344 lb 9.6 oz (156.31 kg)  BMI 63.01 kg/m2  LMP 04/15/2016 (Approximate)  Wt Readings from Last 3 Encounters:  05/08/16 344 lb 9.6 oz (156.31 kg)  04/18/16 332 lb (150.594 kg)  03/27/16 343 lb 9.6 oz (155.856 kg)    Physical Exam  Constitutional: She is oriented to person, place, and time. She appears well-developed and  well-nourished. No distress.  Eyes: Conjunctivae and EOM are normal. Pupils are equal, round, and reactive to light.  Neck: Neck supple. No thyromegaly present.  Cardiovascular: Normal rate, regular rhythm, normal heart sounds and intact distal pulses.   No murmur heard. Pulmonary/Chest: Effort normal and breath sounds normal. No respiratory distress. She has no wheezes.  Abdominal: Soft. Bowel sounds are normal. She exhibits no distension. There is no hepatosplenomegaly (Somewhat difficult to palpate because of patient's morbid obesity). There is no tenderness. There is no rigidity, no rebound, no guarding and no CVA tenderness.  Musculoskeletal: Normal range of motion. She exhibits no edema or tenderness.  Lymphadenopathy:    She has no cervical adenopathy.  Neurological: She is alert and oriented to person, place, and time. Coordination normal.  Skin: Skin is warm and dry. No rash noted. She is not diaphoretic.  Psychiatric: She has a normal mood and affect. Her behavior is normal.  Nursing note and vitals reviewed.   Results for orders placed or performed in visit on 04/30/16  Fecal occult blood, imunochemical  Result Value Ref Range   Fecal Occult Bld Negative Negative      Assessment & Plan:   Problem List Items Addressed This Visit    None    Visit Diagnoses    Intermittent diarrhea    -  Primary    Greek yogurt, probiotic, Imodium, will do stool studies. Patient needs to follow post gallbladder diet better    Relevant Orders    Stool culture    Cdiff NAA+O+P+Stool Culture        Follow up plan: Return in about 4 weeks (around 06/05/2016), or if symptoms worsen or fail to improve, for Follow-up diarrhea.  Counseling provided for all of the vaccine components Orders Placed This Encounter  Procedures  . Stool culture  . Cdiff NAA+O+P+Stool Culture    Caryl Pina, MD [facility] 05/08/2016, 1:40 PM

## 2016-05-15 ENCOUNTER — Other Ambulatory Visit: Payer: Medicaid Other

## 2016-05-16 ENCOUNTER — Encounter: Payer: Self-pay | Admitting: Family Medicine

## 2016-05-19 ENCOUNTER — Ambulatory Visit (INDEPENDENT_AMBULATORY_CARE_PROVIDER_SITE_OTHER): Payer: Medicaid Other | Admitting: Obstetrics & Gynecology

## 2016-05-19 ENCOUNTER — Encounter: Payer: Self-pay | Admitting: Obstetrics & Gynecology

## 2016-05-19 VITALS — BP 136/68 | HR 76 | Ht 62.0 in | Wt 338.2 lb

## 2016-05-19 DIAGNOSIS — A499 Bacterial infection, unspecified: Secondary | ICD-10-CM

## 2016-05-19 DIAGNOSIS — B9689 Other specified bacterial agents as the cause of diseases classified elsewhere: Secondary | ICD-10-CM

## 2016-05-19 DIAGNOSIS — N760 Acute vaginitis: Secondary | ICD-10-CM

## 2016-05-19 LAB — CDIFF NAA+O+P+STOOL CULTURE
E coli, Shiga toxin Assay: NEGATIVE
Toxigenic C. Difficile by PCR: NEGATIVE

## 2016-05-19 LAB — STOOL CULTURE: E COLI SHIGA TOXIN ASSAY: NEGATIVE

## 2016-05-19 MED ORDER — SERTRALINE HCL 100 MG PO TABS: 100.0000 mg | ORAL_TABLET | Freq: Every day | ORAL | Status: DC

## 2016-05-19 MED ORDER — METRONIDAZOLE 500 MG PO TABS: 500.0000 mg | ORAL_TABLET | Freq: Two times a day (BID) | ORAL | Status: DC

## 2016-05-19 NOTE — Progress Notes (Signed)
Patient ID: Heidi Landry, female   DOB: [birth year], 30 y.o.   MRN: [id number]       Chief Complaint  Patient presents with  . c/o vaginal discharge, itching, irritation    also discuss medication-Zoloft    Blood pressure 136/68, pulse 76, height 5' 2"  (1.575 m), weight 338 lb 3.2 oz (153.407 kg), last menstrual period 04/28/2016, not currently breastfeeding.  30 y.o. G1P0101 Patient's last menstrual period was 04/28/2016. The current method of family planning is oral progesterone-only contraceptive.  Subjective Vaginal discharge for 2weeks Itching yes Irritation yes Odor no Similar to previous yes  Previous treatment flagyl  Objective Vulva:  normal appearing vulva with no masses, tenderness or lesions Vagina:  normal mucosa, thin grey discharge Cervix:  no cervical motion tenderness and no lesions Uterus:  normal size, contour, position, consistency, mobility, non-tender Adnexa: ovaries:present,  normal adnexa in size, nontender and no masses     Pertinent ROS No burning with urination, frequency or urgency No nausea, vomiting or diarrhea Nor fever chills or other constitutional symptoms   Labs or studies Wet Prep:   A sample of vaginal discharge was obtained from the posterior fornix using a cotton swab. 2 drops of saline were placed on a slide and the cotton swab was immersed in the saline. Microscopic evaluation was performed and results were as follows:  Negative  for yeast  Positive for clue cells , consistent with Bacterial vaginosis Negative for trichomonas  Normal WBC population   Whiff test: Positive     Impression Diagnoses this Encounter::   ICD-9-CM ICD-10-CM   1. BV (bacterial vaginosis) 616.10 N76.0    041.9 A49.9     Established relevant diagnosis(es):   Plan/Recommendations: Meds ordered this encounter  Medications  . fluticasone (FLONASE) 50 MCG/ACT nasal spray    Sig: Place 1 spray into both nostrils daily.  . metroNIDAZOLE  (FLAGYL) 500 MG tablet    Sig: Take 1 tablet (500 mg total) by mouth 2 (two) times daily.    Dispense:  14 tablet    Refill:  6  . sertraline (ZOLOFT) 100 MG tablet    Sig: Take 1 tablet (100 mg total) by mouth daily. Take 1 tablet daily    Dispense:  30 tablet    Refill:  11    Labs or Scans Ordered: No orders of the defined types were placed in this encounter.    Management::   Follow up Return if symptoms worsen or fail to improve.      All questions were answered.

## 2016-05-26 ENCOUNTER — Other Ambulatory Visit: Payer: Self-pay | Admitting: Pharmacist

## 2016-05-27 MED ORDER — FUROSEMIDE 20 MG PO TABS: 20.0000 mg | ORAL_TABLET | Freq: Every day | ORAL | Status: DC | PRN

## 2016-05-30 ENCOUNTER — Encounter: Payer: Self-pay | Admitting: Pharmacist

## 2016-05-30 ENCOUNTER — Ambulatory Visit (INDEPENDENT_AMBULATORY_CARE_PROVIDER_SITE_OTHER): Payer: Medicaid Other | Admitting: Pharmacist

## 2016-05-30 VITALS — BP 124/70 | HR 90 | Ht 62.0 in | Wt 344.0 lb

## 2016-05-30 DIAGNOSIS — M545 Low back pain: Secondary | ICD-10-CM

## 2016-05-30 DIAGNOSIS — L680 Hirsutism: Secondary | ICD-10-CM | POA: Diagnosis not present

## 2016-05-30 DIAGNOSIS — E669 Obesity, unspecified: Secondary | ICD-10-CM

## 2016-05-30 DIAGNOSIS — D72829 Elevated white blood cell count, unspecified: Secondary | ICD-10-CM | POA: Diagnosis not present

## 2016-05-30 LAB — CBC WITH DIFFERENTIAL/PLATELET
BASOS ABS: 0.1 10*3/uL (ref 0.0–0.2)
Basos: 1 %
EOS (ABSOLUTE): 0.3 10*3/uL (ref 0.0–0.4)
Eos: 3 %
HEMOGLOBIN: 11.5 g/dL (ref 11.1–15.9)
Hematocrit: 37 % (ref 34.0–46.6)
IMMATURE GRANS (ABS): 0.1 10*3/uL (ref 0.0–0.1)
Immature Granulocytes: 1 %
LYMPHS ABS: 2.2 10*3/uL (ref 0.7–3.1)
Lymphs: 24 %
MCH: 25.3 pg — AB (ref 26.6–33.0)
MCHC: 31.1 g/dL — ABNORMAL LOW (ref 31.5–35.7)
MCV: 81 fL (ref 79–97)
MONOS ABS: 0.4 10*3/uL (ref 0.1–0.9)
Monocytes: 4 %
NEUTROS ABS: 6.5 10*3/uL (ref 1.4–7.0)
Neutrophils: 67 %
Platelets: 219 10*3/uL (ref 150–379)
RBC: 4.55 x10E6/uL (ref 3.77–5.28)
RDW: 15.5 % — ABNORMAL HIGH (ref 12.3–15.4)
WBC: 9.5 10*3/uL (ref 3.4–10.8)

## 2016-05-30 MED ORDER — PHENTERMINE HCL 37.5 MG PO CAPS: ORAL_CAPSULE | ORAL | Status: DC

## 2016-05-30 MED ORDER — SPIRONOLACTONE 100 MG PO TABS: 100.0000 mg | ORAL_TABLET | Freq: Every day | ORAL | Status: DC

## 2016-05-30 NOTE — Progress Notes (Signed)
Patient ID: Heidi Landry, female   DOB: Jan 10, 1986, 30 y.o.   MRN: [id number]  Subjective:     Heidi Landry is a 30 y.o. female is here today for follow up diet / weight loss / obesity.  Patient cites health, increased physical ability, self-image as reasons for wanting to lose weight.  She admits that she has been exercising less due to increased back pain. She recently has appt with Dr Nelva Bush / ortho and received steroid epidural.  This helped a little.  Patient would like to see chiropractor.   She has been taking furosemide 24m daily for swelling and edema has improved but she states that somedays she feels that she needs more.  Her last labs show elevated testosterone and she has a history of hirsutism.    She also has an elevated WBC 1 month ago but it was actually less than previous.  Obesity History Lowest adult weight: 250 Highest adult weight: 335 Lost down to 310# while pregnant when she stopped drinking soda and switched to water   History of Weight Loss Efforts Tried Atkins Diet - lost about 30 lbs Worked out at [employer]- lost [employer] past and lost a "little bit" of weight.   Current Eating Habits Number of regular meals per day: 3 Number of snacking episodes per day: 4 Who shops for food? patient Who prepares food? patient Who eats with patient? patient and husband Eating precipitated by stress? yes -   Guilt feelings associated with eating? yes -   Patient has been limiting portion sizes and has stopped drinking sodas.  She is eating leaner proteins and has increase vegetables.   Other Potential Contributing Factors Use of alcohol: average 0 drinks/week Use of medications that may cause weight gain estrogens (BCPs)  Psych History: depression Comorbidities: GERD and hypertension The following portions of the patient's history were reviewed and updated as appropriate: allergies, current medications, past family history, past medical history, past social  history, past surgical history and problem list.      Objective:    BP 124/70 mmHg  Pulse 90  Ht 5' 2"  (1.575 m)  Wt 344 lb (156.037 kg)  BMI 62.90 kg/m2  LMP 04/28/2016 Body mass index is 62.9 kg/(m^2).   Trace edema bilaterally    Assessment:    Obesity with BMI and comorbidities as noted above - weight is up 6# over last month Patient readiness to commit to diet and activity changes: good Barriers to weight loss: stress ( ) and time limitations  Elevated testosterone / hirsutism Edema Elevated WBC Low Back pain     Plan:    1. Start phentermine 37.533mtake 1/2 to 1 tablet qam 2. Continue current diet interventions: low calorie (1000 kCal/d) deficit diet 3. Referral to chiropractor sent   4. Change furosemide to spirolactone 10034md - should help with edema and might also help with hirsutism and lower testosterone  5.  CBC checked today 6.  Follow up: 4 weeks and as needed.    TamCherre RobinsharmD, CPP

## 2016-06-06 ENCOUNTER — Ambulatory Visit (INDEPENDENT_AMBULATORY_CARE_PROVIDER_SITE_OTHER): Payer: Medicaid Other | Admitting: Family Medicine

## 2016-06-06 ENCOUNTER — Encounter: Payer: Self-pay | Admitting: Family Medicine

## 2016-06-06 VITALS — BP 133/83 | HR 93 | Temp 98.5°F | Ht 62.0 in | Wt 342.0 lb

## 2016-06-06 DIAGNOSIS — R197 Diarrhea, unspecified: Secondary | ICD-10-CM

## 2016-06-06 NOTE — Progress Notes (Signed)
BP 133/83 mmHg  Pulse 93  Temp(Src) 98.5 F (36.9 C) (Oral)  Ht 5' 2"  (1.575 m)  Wt 342 lb (155.13 kg)  BMI 62.54 kg/m2  LMP 05/30/2016   Subjective:    Patient ID: Heidi Landry, female    DOB: 1986-06-21, 30 y.o.   MRN: [id number]  HPI: Heidi Landry is a 30 y.o. female presenting on 06/06/2016 for Followup of diarrhea episodes   HPI Diarrhea follow-up Patient is coming in for a follow-up for diarrhea. Had been having off and on more chronic intermittent diarrhea and she had had a history of some bacterial infections and recent antibiotics. It was recommended for her to take probiotics and [place] yogurt and Imodium as needed. Since that time she is having a lot more formed stools and her abdominal pain has really cleared up. She has not even had to use the Imodium very much but is sticking with the probiotic and [place] yogurt. She occasionally still has a gurgling or gassy sensation in her left upper quadrant over her stomach. She denies any heartburn or acid reflux symptoms but does occasionally have some belching. She is currently on omeprazole 20 mg. She denies any fevers or chills or blood in her stool or urinary problems. She does not associate the issues becoming worse with her menstruation.  Relevant past medical, surgical, family and social history reviewed and updated as indicated. Interim medical history since our last visit reviewed. Allergies and medications reviewed and updated.  Review of Systems  Constitutional: Negative for fever and chills.  HENT: Negative for congestion, ear discharge and ear pain.   Eyes: Negative for redness and visual disturbance.  Respiratory: Negative for chest tightness and shortness of breath.   Cardiovascular: Negative for chest pain and leg swelling.  Gastrointestinal: Positive for abdominal pain and diarrhea. Negative for nausea, vomiting, constipation and blood in stool.  Genitourinary: Negative for dysuria, urgency, hematuria, flank  pain, decreased urine volume and difficulty urinating.  Musculoskeletal: Negative for back pain and gait problem.  Skin: Negative for rash.  Neurological: Negative for light-headedness and headaches.  Psychiatric/Behavioral: Negative for behavioral problems and agitation.  All other systems reviewed and are negative.   Per HPI unless specifically indicated above     Medication List       This list is accurate as of: 06/06/16  1:38 PM.  Always use your most recent med list.               cetirizine 10 MG tablet  Commonly known as:  ZYRTEC  Take 10 mg by mouth daily.     CONDYLOX 0.5 % gel  Generic drug:  podofilox  APPLY TOPICALLY TO ACCESSIBLE WARTS TWICE DAILY.     fluticasone 50 MCG/ACT nasal spray  Commonly known as:  FLONASE  Place 1 spray into both nostrils daily.     ibuprofen 600 MG tablet  Commonly known as:  ADVIL,MOTRIN  Take 1 tablet (600 mg total) by mouth every 6 (six) hours.     metoprolol succinate 25 MG 24 hr tablet  Commonly known as:  TOPROL-XL  Take 1 tablet (25 mg total) by mouth daily. Take with or immediately following a meal.     norethindrone 0.35 MG tablet  Commonly known as:  ORTHO MICRONOR  Take 1 tablet (0.35 mg total) by mouth daily.     omeprazole 20 MG capsule  Commonly known as:  PRILOSEC  Take 1 capsule (20 mg total) by mouth daily. 1 tablet a  day     phentermine 37.5 MG capsule  Take 1/2 to 1 tablet by mouth each morning.     sertraline 100 MG tablet  Commonly known as:  ZOLOFT  Take 1 tablet (100 mg total) by mouth daily. Take 1 tablet daily     spironolactone 100 MG tablet  Commonly known as:  ALDACTONE  Take 1 tablet (100 mg total) by mouth daily.     vitamin C 1000 MG tablet  Take 1,000 mg by mouth daily.           Objective:    BP 133/83 mmHg  Pulse 93  Temp(Src) 98.5 F (36.9 C) (Oral)  Ht 5' 2"  (1.575 m)  Wt 342 lb (155.13 kg)  BMI 62.54 kg/m2  LMP 05/30/2016  Wt Readings from Last 3 Encounters:    06/06/16 342 lb (155.13 kg)  05/30/16 344 lb (156.037 kg)  05/19/16 338 lb 3.2 oz (153.407 kg)    Physical Exam  Constitutional: She is oriented to person, place, and time. She appears well-developed and well-nourished. No distress.  HENT:  Right Ear: External ear normal.  Left Ear: External ear normal.  Nose: Nose normal.  Mouth/Throat: Oropharynx is clear and moist. No oropharyngeal exudate.  Eyes: Conjunctivae and EOM are normal. Pupils are equal, round, and reactive to light.  Neck: Neck supple. No thyromegaly present.  Cardiovascular: Normal rate, regular rhythm, normal heart sounds and intact distal pulses.   No murmur heard. Pulmonary/Chest: Effort normal and breath sounds normal. No respiratory distress. She has no wheezes.  Abdominal: Soft. Bowel sounds are normal. She exhibits no distension. There is no tenderness. There is no rebound and no guarding.  Musculoskeletal: Normal range of motion. She exhibits no edema or tenderness.  Lymphadenopathy:    She has no cervical adenopathy.  Neurological: She is alert and oriented to person, place, and time. Coordination normal.  Skin: Skin is warm and dry. No rash noted. She is not diaphoretic.  Psychiatric: She has a normal mood and affect. Her behavior is normal.  Nursing note and vitals reviewed.     Assessment & Plan:   Problem List Items Addressed This Visit    None    Visit Diagnoses    Intermittent diarrhea    -  Primary    Patient is doing a lot better but still has a little bit of indigestion, we discussed the possibility of doing Zantac versus Gas-X along with her probiotics        Follow up plan: Return if symptoms worsen or fail to improve.  Counseling provided for all of the vaccine components No orders of the defined types were placed in this encounter.    Caryl Pina, MD [facility] 06/06/2016, 1:38 PM

## 2016-06-24 ENCOUNTER — Other Ambulatory Visit: Payer: Self-pay | Admitting: Pharmacist

## 2016-07-14 ENCOUNTER — Ambulatory Visit (INDEPENDENT_AMBULATORY_CARE_PROVIDER_SITE_OTHER): Payer: Medicaid Other | Admitting: Pediatrics

## 2016-07-14 ENCOUNTER — Encounter: Payer: Self-pay | Admitting: Pediatrics

## 2016-07-14 ENCOUNTER — Other Ambulatory Visit: Payer: Self-pay | Admitting: Family Medicine

## 2016-07-14 VITALS — BP 146/87 | HR 90 | Temp 98.8°F | Ht 62.0 in | Wt 350.6 lb

## 2016-07-14 DIAGNOSIS — I10 Essential (primary) hypertension: Secondary | ICD-10-CM | POA: Diagnosis not present

## 2016-07-14 DIAGNOSIS — F411 Generalized anxiety disorder: Secondary | ICD-10-CM

## 2016-07-14 DIAGNOSIS — Z6841 Body Mass Index (BMI) 40.0 and over, adult: Secondary | ICD-10-CM | POA: Diagnosis not present

## 2016-07-14 DIAGNOSIS — F53 Postpartum depression: Secondary | ICD-10-CM

## 2016-07-14 DIAGNOSIS — O99345 Other mental disorders complicating the puerperium: Secondary | ICD-10-CM

## 2016-07-14 MED ORDER — PHENTERMINE HCL 37.5 MG PO CAPS: ORAL_CAPSULE | ORAL | Status: DC

## 2016-07-14 MED ORDER — SERTRALINE HCL 100 MG PO TABS: 100.0000 mg | ORAL_TABLET | Freq: Every day | ORAL | Status: DC

## 2016-07-14 NOTE — Patient Instructions (Addendum)
Sertraline to 147m daily Walk or other exercise for 15-20 minutes daily Take half of the phenteramine pill If you have heart palpitations stop, and let u[place]ore[place]now. Stop drinking all regular sodas Stop caffeine Call to set up counseling appointment

## 2016-07-14 NOTE — Progress Notes (Signed)
Subjective:    Patient ID: Heidi Landry, female    DOB: [birth year], 30 y.o.   MRN: [id number]  CC: Follow-up multiple med problems  HPI: Heidi Landry is a 30 y.o. female presenting for Follow-up  Elevated BMI: Taking phentermine, off of it for a couple weeks when had wisdom teeth out Needs refill phentermine Thinks it helps when she takes it Doesn't eat as much Pt does most of grocery shpping Keeps peanut butter crackers, salt and vinegar chips, chocolate at home Drinking water some more Drinking more than 2 pepsis a day  Stomach ahs gotten better, no diarrhea Having regular bowel movements  Edema: Since starting spironolactone notices that she has less swelling  Has pap smear scheduled for next month  H/o gestational diabetes  OSA: wearing cpap at night  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 Feels better while on it Mood has been fine If she misses a day of zoloft gets more aggravated Having some anxiety  Drinks 1-2 cups of coffee a day Plus multiple caffeinated sodas  Feels palpitations for <1 min every few days Happened when off of phenteramine as well Feels like the breath is knocked out of her Thinks sometimes it is her anxiety  Depression screen [facility] 2/9 07/14/2016 06/06/2016 05/08/2016 03/12/2016 01/30/2016  Decreased Interest 0 0 0 0 0  Down, Depressed, Hopeless 0 0 0 0 0  PHQ - 2 Score 0 0 0 0 0     Relevant past medical, surgical, family and social history reviewed and updated as indicated.  Interim medical history since our last visit reviewed. Allergies and medications reviewed and updated.  ROS: Per HPI unless specifically indicated above  History  Smoking status  . Former Smoker -- 1.00 packs/day for 9 years  . Types: Cigarettes  . Start date: 05/25/2006  . Quit date: 01/04/2016  Smokeless tobacco  . Never Used       Objective:    BP 146/87 mmHg  Pulse 90  Temp(Src) 98.8 F (37.1 C) (Oral)  Ht 5' 2"   (1.575 m)  Wt 350 lb 9.6 oz (159.031 kg)  BMI 64.11 kg/m2  Wt Readings from Last 3 Encounters:  07/14/16 350 lb 9.6 oz (159.031 kg)  06/06/16 342 lb (155.13 kg)  05/30/16 344 lb (156.037 kg)     Gen: NAD, alert, cooperative with exam, NCAT EYES: EOMI, no scleral injection or icterus CV: NRRR, normal S1/S2 Resp: CTABL, no wheezes, normal WOB Ext: No pitting edema, warm Neuro: Alert and oriented     Assessment & Plan:    Denay was seen today for follow-up mulitple med problems. Will treat anxiety, increas sertraline. Will continue to titrate as needed future visits. Stop caffeine and start daily exercise to help with anxiety. If symptoms worsen with phenteramine pt will stop. Gave one Rx for #30 tabs, must be seen for further refills to assess symptoms.  Diagnoses and all orders for this visit:  Post partum depression -     sertraline (ZOLOFT) 100 MG tablet; Take 1 tablet (100 mg total) by mouth daily. Take 1 tablet daily  Essential hypertension Elevated today, repeat next visit  BMI 60.0-69.9, adult (HCC) -     phentermine 37.5 MG capsule; Take 1/2 to 1 tablet by mouth each morning.  Generalized anxiety disorder -     sertraline (ZOLOFT) 100 MG tablet; Take 1 tablet (100 mg total) by mouth daily. Take 1 tablet daily  Patient Instructions  Sertraline to 13m daily  Walk or other exercise for 15-20 minutes daily Take half of the phenteramine pill If you have heart palpitations stop, and let us know. Stop drinking all regular sodas Stop caffeine Call to set up counseling appointment   Follow up plan: Return for 4 weeks to see Tammy, 8 weeks to see me.  Assunta Found, MD [facility] 07/14/2016, 2:41 PM

## 2016-07-15 NOTE — Telephone Encounter (Signed)
Last seen 07/14/16   Dr Evette Doffing  If approved route to nurse to call into [facility]

## 2016-08-11 ENCOUNTER — Encounter: Payer: Medicaid Other | Admitting: Pharmacist

## 2016-08-12 ENCOUNTER — Encounter: Payer: Self-pay | Admitting: Pediatrics

## 2016-08-19 ENCOUNTER — Encounter: Payer: Self-pay | Admitting: Obstetrics & Gynecology

## 2016-08-19 ENCOUNTER — Other Ambulatory Visit (HOSPITAL_COMMUNITY)
Admission: RE | Admit: 2016-08-19 | Discharge: 2016-08-19 | Disposition: A | Discharge status: Home / Self Care | Payer: Medicaid Other | Source: Ambulatory Visit | Attending: Obstetrics & Gynecology | Admitting: Obstetrics & Gynecology

## 2016-08-19 ENCOUNTER — Ambulatory Visit (INDEPENDENT_AMBULATORY_CARE_PROVIDER_SITE_OTHER): Payer: Medicaid Other | Admitting: Obstetrics & Gynecology

## 2016-08-19 VITALS — BP 112/76 | HR 110 | Ht 62.0 in | Wt 354.4 lb

## 2016-08-19 DIAGNOSIS — Z01419 Encounter for gynecological examination (general) (routine) without abnormal findings: Secondary | ICD-10-CM | POA: Diagnosis not present

## 2016-08-19 DIAGNOSIS — Z0000 Encounter for general adult medical examination without abnormal findings: Secondary | ICD-10-CM

## 2016-08-19 DIAGNOSIS — Z1151 Encounter for screening for human papillomavirus (HPV): Secondary | ICD-10-CM | POA: Insufficient documentation

## 2016-08-19 DIAGNOSIS — Z113 Encounter for screening for infections with a predominantly sexual mode of transmission: Secondary | ICD-10-CM | POA: Diagnosis present

## 2016-08-19 MED ORDER — NORETHINDRONE 0.35 MG PO TABS: 1.0000 | ORAL_TABLET | Freq: Every day | ORAL | 11 refills | Status: DC

## 2016-08-19 NOTE — Progress Notes (Signed)
Subjective:     Heidi Landry is a 30 y.o. female here for a routine exam.  No LMP recorded. G1P0101 Birth Control Method:  micronor Menstrual Calendar(currently): regular  Current complaints: increased number of BM, watery at times.   Current acute medical issues:  Morbid obesity   Recent Gynecologic History No LMP recorded. Last Pap: 2016,  normal Last mammogram: ,    Past Medical History:  Diagnosis Date  . Anxiety   . Carpal tunnel syndrome, bilateral   . Chest pain   . Chronic back pain   . Degenerative disc disease   . Edema   . Gestational diabetes mellitus, antepartum   . Hypertension   . IBS (irritable bowel syndrome)   . Panic attacks   . Post partum depression   . Sleep apnea   . Spondylosis   . Tendonitis     Past Surgical History:  Procedure Laterality Date  . CESAREAN SECTION N/A 12/08/2015   Procedure: CESAREAN SECTION;  Surgeon: Jonnie Kind, MD;  Location: [facility];  Service: Obstetrics;  Laterality: N/A;  . CHOLECYSTECTOMY      OB History    Gravida Para Term Preterm AB Living   1 1   1   1    SAB TAB Ectopic Multiple Live Births         0 1      Social History   Social History  . Marital status: Single    Spouse name: N/A  . Number of children: N/A  . Years of education: 81   Occupational History  .  [employer]   Social History Main Topics  . Smoking status: Former Smoker    Packs/day: 1.00    Years: 9.00    Types: Cigarettes    Start date: 05/25/2006    Quit date: 01/04/2016  . Smokeless tobacco: Never Used  . Alcohol use No  . Drug use: No  . Sexual activity: Yes    Birth control/ protection: Pill   Other Topics Concern  . None   Social History Narrative  . None    Family History  Problem Relation Age of Onset  . Fibromyalgia Mother   . Cancer Father     lung, colon   . Hernia Father   . Diabetes Maternal Grandmother   . Hypertension Maternal Grandmother   . Cancer Paternal Uncle   . Brain cancer  Paternal Uncle   . Throat cancer Paternal Uncle   . Thyroid disease Paternal Aunt      Current Outpatient Prescriptions:  .  Ascorbic Acid (VITAMIN C) 1000 MG tablet, Take 1,000 mg by mouth daily., Disp: , Rfl:  .  cetirizine (ZYRTEC) 10 MG tablet, Take 10 mg by mouth daily. , Disp: , Rfl:  .  CONDYLOX 0.5 % gel, APPLY TOPICALLY TO ACCESSIBLE WARTS TWICE DAILY. (Patient taking differently: APPLY TOPICALLY TO ACCESSIBLE WARTS TWICE DAILY. as needed), Disp: 3.5 g, Rfl: 0 .  fluticasone (FLONASE) 50 MCG/ACT nasal spray, Place 1 spray into both nostrils daily., Disp: , Rfl:  .  ibuprofen (ADVIL,MOTRIN) 600 MG tablet, Take 1 tablet (600 mg total) by mouth every 6 (six) hours., Disp: 30 tablet, Rfl: 0 .  metoprolol succinate (TOPROL-XL) 25 MG 24 hr tablet, Take 1 tablet (25 mg total) by mouth daily. Take with or immediately following a meal., Disp: 30 tablet, Rfl: 6 .  metroNIDAZOLE (FLAGYL) 500 MG tablet, Take 500 mg by mouth 2 (two) times daily., Disp: , Rfl:  .  norethindrone (ORTHO MICRONOR) 0.35 MG tablet, Take 1 tablet (0.35 mg total) by mouth daily., Disp: 1 Package, Rfl: 11 .  omeprazole (PRILOSEC) 20 MG capsule, Take 1 capsule (20 mg total) by mouth daily. 1 tablet a day, Disp: 30 capsule, Rfl: 6 .  phentermine (ADIPEX-P) 37.5 MG tablet, TAKE ONE-HALF TO ONE TABLET BY MOUTH IN THE MORNING, Disp: 30 tablet, Rfl: 0 .  sertraline (ZOLOFT) 100 MG tablet, Take 1 tablet (100 mg total) by mouth daily. Take 1 tablet daily, Disp: 30 tablet, Rfl: 2 .  spironolactone (ALDACTONE) 100 MG tablet, TAKE ONE TABLET BY MOUTH ONCE DAILY, Disp: 30 tablet, Rfl: 2  Review of Systems  Review of Systems  Constitutional: Negative for fever, chills, weight loss, malaise/fatigue and diaphoresis.  HENT: Negative for hearing loss, ear pain, nosebleeds, congestion, sore throat, neck pain, tinnitus and ear discharge.   Eyes: Negative for blurred vision, double vision, photophobia, pain, discharge and redness.   Respiratory: Negative for cough, hemoptysis, sputum production, shortness of breath, wheezing and stridor.   Cardiovascular: Negative for chest pain, palpitations, orthopnea, claudication, leg swelling and PND.  Gastrointestinal: negative for abdominal pain. Negative for heartburn, nausea, vomiting, diarrhea, constipation, blood in stool and melena.  Genitourinary: Negative for dysuria, urgency, frequency, hematuria and flank pain.  Musculoskeletal: Negative for myalgias, back pain, joint pain and falls.  Skin: Negative for itching and rash.  Neurological: Negative for dizziness, tingling, tremors, sensory change, speech change, focal weakness, seizures, loss of consciousness, weakness and headaches.  Endo/Heme/Allergies: Negative for environmental allergies and polydipsia. Does not bruise/bleed easily.  Psychiatric/Behavioral: Negative for depression, suicidal ideas, hallucinations, memory loss and substance abuse. The patient is not nervous/anxious and does not have insomnia.        Objective:  Blood pressure 112/76, pulse (!) 110, height 5' 2"  (1.575 m), weight (!) 354 lb 6.4 oz (160.8 kg), not currently breastfeeding.   Physical Exam  Vitals reviewed. Constitutional: She is oriented to person, place, and time. She appears well-developed and well-nourished.  HENT:  Head: Normocephalic and atraumatic.        Right Ear: External ear normal.  Left Ear: External ear normal.  Nose: Nose normal.  Mouth/Throat: Oropharynx is clear and moist.  Eyes: Conjunctivae and EOM are normal. Pupils are equal, round, and reactive to light. Right eye exhibits no discharge. Left eye exhibits no discharge. No scleral icterus.  Neck: Normal range of motion. Neck supple. No tracheal deviation present. No thyromegaly present.  Cardiovascular: Normal rate, regular rhythm, normal heart sounds and intact distal pulses.  Exam reveals no gallop and no friction rub.   No murmur heard. Respiratory: Effort normal and  breath sounds normal. No respiratory distress. She has no wheezes. She has no rales. She exhibits no tenderness.  GI: Soft. Bowel sounds are normal. She exhibits no distension and no mass. There is no tenderness. There is no rebound and no guarding.  Genitourinary:  Breasts no masses skin changes or nipple changes bilaterally      Vulva is normal without lesions Vagina is pink moist without discharge Cervix normal in appearance and pap is done Uterus is normal size shape and contour Adnexa is negative with normal sized ovaries   Musculoskeletal: Normal range of motion. She exhibits no edema and no tenderness.  Neurological: She is alert and oriented to person, place, and time. She has normal reflexes. She displays normal reflexes. No cranial nerve deficit. She exhibits normal muscle tone. Coordination normal.  Skin: Skin is warm and  dry. No rash noted. No erythema. No pallor.  Psychiatric: She has a normal mood and affect. Her behavior is normal. Judgment and thought content normal.       Medications Ordered at today's visit: Meds ordered this encounter  Medications  . metroNIDAZOLE (FLAGYL) 500 MG tablet    Sig: Take 500 mg by mouth 2 (two) times daily.  . norethindrone (ORTHO MICRONOR) 0.35 MG tablet    Sig: Take 1 tablet (0.35 mg total) by mouth daily.    Dispense:  1 Package    Refill:  11    Other orders placed at today's visit: No orders of the defined types were placed in this encounter.     Assessment:    Healthy female exam.    Plan:    Contraception: oral progesterone-only contraceptive. Follow up in: 1 year.     Return in about 1 year (around 08/19/2017) for yearly, with Dr Elonda Husky.

## 2016-08-20 LAB — CYTOLOGY - PAP

## 2016-09-01 ENCOUNTER — Other Ambulatory Visit: Payer: Self-pay | Admitting: Pediatrics

## 2016-09-02 ENCOUNTER — Ambulatory Visit (INDEPENDENT_AMBULATORY_CARE_PROVIDER_SITE_OTHER): Payer: Medicaid Other | Admitting: Pharmacist

## 2016-09-02 ENCOUNTER — Encounter: Payer: Self-pay | Admitting: Pharmacist

## 2016-09-02 VITALS — BP 134/72 | HR 84 | Ht 62.0 in | Wt 354.0 lb

## 2016-09-02 DIAGNOSIS — R5383 Other fatigue: Secondary | ICD-10-CM

## 2016-09-02 DIAGNOSIS — E669 Obesity, unspecified: Secondary | ICD-10-CM

## 2016-09-02 DIAGNOSIS — Z8632 Personal history of gestational diabetes: Secondary | ICD-10-CM | POA: Diagnosis not present

## 2016-09-02 LAB — BAYER DCA HB A1C WAIVED: HB A1C: 5.7 % (ref <7.0)

## 2016-09-02 LAB — GLUCOSE HEMOCUE WAIVED: GLU HEMOCUE WAIVED: 93 mg/dL (ref 65–99)

## 2016-09-02 MED ORDER — PHENTERMINE HCL 37.5 MG PO TABS: 37.5000 mg | ORAL_TABLET | Freq: Every day | ORAL | 0 refills | Status: DC

## 2016-09-02 NOTE — Patient Instructions (Signed)
Try to have smaller meals and snack: 400 calories per meal and 100 calories per snack.  Eat more frequently but smaller portions - about 4 to 6 times per day. No Regular Sodas - replace with diet soda (1 per day), soda water or water Increase lean proteins (chicken, [place], fish) occasional beef and pork Increase vegetables and fruits 1/2 cup is starch serving size - potatoes, rice, pasta, bread Snack Ideas (can have up to 3 snacks a day as long as you keep them at or under 100 calories each) . Fruit  o 2 cutie oranges, 1 regular orange or canned mandarin oranges o 1 apple or  cup applesauce (can dip apples in yogurt or peanut/nut/ sunflower butter) o Sliced mango o Berries - strawberries, blueberries, raspberries o Melon - watermelon, cantaloupe or honey dew o 1 kiwi o Grapes  o  banana . Veggies - (can dip on small amount of low fat ranch or hummus) o Carrots o Peppers - red, orange and yellow are very sweet and tasty o Cucumbers o Celery  o Cherry tomatoes o Zucchini o Broccoli or cauliflower .  Banana with peanut better rolled in whole wheat tortilla . Stringed cheese or small packets of mozzarella cheese balls . Plain greek yogurt + honey + fruit of choice . Cottage cheese + 1 packet of Splenda + 2 tbsp of mini chocolate chips or cinnamon . Blueberries dipped in yogurt and then frozen . Creamy grape dessert (see recipe) . Popcorn . Low sugar or sugar free jello, pudding or fudgsicle / popsicle

## 2016-09-02 NOTE — Progress Notes (Signed)
Patient ID: Heidi Landry, female   DOB: 1986-01-01, 30 y.o.   MRN: [id number]   Subjective:     Heidi Landry is a 30 y.o. female is here today for follow up diet / weight loss / obesity.  Patient cites health, increased physical ability, self-image as reasons for wanting to lose weight.  She has not been following recommended diet or exercising. She was instructed by chiropractor to not exercise until he can complete a few more treatments.   She has been taking spironolactone 12m qd for edema.  She also has labs in past showing elevated testosterone and she has a history of hirsutism which spironolactone can improve hirsutism and decrease edema and testosterone.    She reports she is concern about her BG because she has experiences fatigue and blurry vision lately.    Obesity History Lowest adult weight: 250 Highest adult weight: 354 Lost down to 310# while pregnant when she stopped drinking soda and switched to water   History of Weight Loss Efforts Tried Atkins Diet - lost about 30 lbs [facility]ed [facility] at [employer]- lost [employer] past and lost a "little bit" of weight.   Current Eating Habits Number of regular meals per day: 2 Number of snacking episodes per day: 4 Who shops for food? patient Who prepares food? patient Who eats with patient? patient and husband Eating precipitated by stress? yes -   Guilt feelings associated with eating? yes -   Patient has been limiting portion sizes and has stopped drinking sodas.  She is eating leaner proteins and has increase vegetables.   Other Potential Contributing Factors Use of alcohol: average 0 drinks/week Use of medications that may cause weight gain estrogens (BCPs)  Psych History: depression  Increased stress related to possibility of moving soon, son's health and problems with automobile. Comorbidities: GERD and hypertension The following portions of the patient's history were reviewed and updated as appropriate: allergies,  current medications, past family history, past medical history, past social history, past surgical history and problem list.      Objective:    BP 134/72 (BP Location: Left Wrist, Patient Position: Sitting, Cuff Size: Normal)   Pulse 84   Ht 5' 2"  (1.575 m)   Wt (!) 354 lb (160.6 kg)   BMI 64.75 kg/m  Body mass index is 64.75 kg/m.   Negative edema bialterally A1c = 5.7% (today) FBG was 93 today in office    Assessment:    Obesity with BMI and comorbidities as noted above - weight is stable compared to 1 month ago but up 10 lbs compared to 3 months ago Patient readiness to commit to diet and activity changes: fair Barriers to weight loss: stress ( ) and time limitations  Edema Low Back pain - limiting activity     Plan:    1. Continue phentermine 37.556mtake 1 tablet qam (reminded patient of teratogenicity and if she suspects pregnancy to stop ASAP - she voices understanding - she is compliant with BCPs) 2. Continue current diet interventions: low calorie (1000 kCal/d) deficit diet.    No regular soda  Increase frequency of meals and snacks but make servings / size smaller.  Calorie recommendations is 400 or fewer calories per meal and 100 or fewer calories per snack.   Gave list of health snacks 3. Contiue spirolactone 10043md 4.   Orders Placed This Encounter  Procedures  . Glucose Hemocue Waived  . Bayer DCA Hb A1c Waived    5.  Follow up: 4 weeks with PCP and 8 weeks with me.  Cherre Robins, PharmD, CPP

## 2016-09-29 ENCOUNTER — Ambulatory Visit: Payer: Medicaid Other | Admitting: Pediatrics

## 2016-10-04 ENCOUNTER — Other Ambulatory Visit: Payer: Self-pay | Admitting: Obstetrics & Gynecology

## 2016-10-06 ENCOUNTER — Encounter: Payer: Self-pay | Admitting: Pediatrics

## 2016-10-09 ENCOUNTER — Ambulatory Visit (INDEPENDENT_AMBULATORY_CARE_PROVIDER_SITE_OTHER): Payer: Medicaid Other | Admitting: Pediatrics

## 2016-10-09 ENCOUNTER — Encounter: Payer: Self-pay | Admitting: Pediatrics

## 2016-10-09 ENCOUNTER — Telehealth: Payer: Self-pay | Admitting: Pediatrics

## 2016-10-09 VITALS — BP 137/88 | HR 104 | Temp 99.7°F | Ht 62.0 in | Wt 355.2 lb

## 2016-10-09 DIAGNOSIS — M545 Low back pain, unspecified: Secondary | ICD-10-CM

## 2016-10-09 DIAGNOSIS — I10 Essential (primary) hypertension: Secondary | ICD-10-CM | POA: Diagnosis not present

## 2016-10-09 DIAGNOSIS — O878 Other venous complications in the puerperium: Secondary | ICD-10-CM | POA: Diagnosis not present

## 2016-10-09 DIAGNOSIS — K589 Irritable bowel syndrome without diarrhea: Secondary | ICD-10-CM | POA: Insufficient documentation

## 2016-10-09 DIAGNOSIS — N926 Irregular menstruation, unspecified: Secondary | ICD-10-CM

## 2016-10-09 DIAGNOSIS — K582 Mixed irritable bowel syndrome: Secondary | ICD-10-CM

## 2016-10-09 DIAGNOSIS — O24410 Gestational diabetes mellitus in pregnancy, diet controlled: Secondary | ICD-10-CM | POA: Diagnosis not present

## 2016-10-09 DIAGNOSIS — O874 Varicose veins of lower extremity in the puerperium: Secondary | ICD-10-CM

## 2016-10-09 LAB — PREGNANCY, URINE: PREG TEST UR: NEGATIVE

## 2016-10-09 MED ORDER — CYCLOBENZAPRINE HCL 10 MG PO TABS: 10.0000 mg | ORAL_TABLET | Freq: Three times a day (TID) | ORAL | 0 refills | Status: DC | PRN

## 2016-10-09 MED ORDER — SPIRONOLACTONE 100 MG PO TABS: 100.0000 mg | ORAL_TABLET | Freq: Every day | ORAL | 1 refills | Status: DC

## 2016-10-09 NOTE — Telephone Encounter (Signed)
Please advise on letter patient is requesting.

## 2016-10-09 NOTE — Progress Notes (Signed)
Subjective:   Patient ID: Heidi Landry, female    DOB: Nov 21, 1986, 30 y.o.   MRN: [id number] CC: Follow-up (2 month) multiple med problems  HPI: Heidi Landry is a 30 y.o. female presenting for Follow-up (2 month)  Says she is feeling well Has been a little down past two months because of home stress Recent move Dad causing stress, has had problems with him in the past Car fixed soon will help Mom visiting also is helpful  Anxiety: on sertraline, thinks it is working well at 167m, does not want to change  Recent back pain: a few weeks ago having low back pain spasms Seen by chiropractor Has tried ibuprofen, minimal help  Elevated BMI: Still drinking regular sodas Seen by Tammy for nutrition, pt says she knows what she needs to do, having a hard time doing it  Missed period: Forgot birth control pill twice last month Took pregnancy test 1.5 weeks ago, was negative Missed period end of September, still has not had period  Has painful varicose vein on L leg, present for past couple of years, hurts every time she stands  Thinks her irritable bowel symptoms are back Having loose stools at times Has a lot of stomach gurgling  Relevant past medical, surgical, family and social history reviewed. Allergies and medications reviewed and updated. History  Smoking Status  . Former Smoker  . Packs/day: 1.00  . Years: 9.00  . Types: Cigarettes  . Start date: 05/25/2006  . Quit date: 01/04/2016  Smokeless Tobacco  . Never Used   ROS: Per HPI   Objective:    BP (!) 144/93   Pulse (!) 108   Temp 99.7 F (37.6 C) (Oral)   Ht 5' 2"  (1.575 m)   Wt (!) 355 lb 3.2 oz (161.1 kg)   BMI 64.97 kg/m   Wt Readings from Last 3 Encounters:  10/09/16 (!) 355 lb 3.2 oz (161.1 kg)  09/02/16 (!) 354 lb (160.6 kg)  08/19/16 (!) 354 lb 6.4 oz (160.8 kg)    Gen: NAD, alert, cooperative with exam, NCAT EYES: EOMI, no conjunctival injection, or no icterus CV: NRRR, normal S1/S2, no  murmur, distal pulses 2+ b/l Resp: CTABL, no wheezes, normal WOB Abd: +BS, soft, NT Ext: No edema, warm Neuro: Alert and oriented, strength equal b/l UE and LE, coordination grossly normal MSK: normal muscle bulk Skin: Upper L thigh with superficial varicose vein, soft, compressible, no redness, no tenderness  Assessment & Plan:  ARemingtynwas seen today for follow-up multiple med problems  Diagnoses and all orders for this visit:  Essential hypertension Adequate control, cont to follow, encourage weight loss Pt thinking of switching from estrogen containing OCP which may also be contributing to HTN Continue below -     spironolactone (ALDACTONE) 100 MG tablet; Take 1 tablet (100 mg total) by mouth daily.  Missed period preg test negative Pt to get back in to see OB to discuss IUD placement Discussed increased failure with OCP with obesity -     Pregnancy, urine  Irritable bowel syndrome with both constipation and diarrhea Discussed foods that often increase symptoms, rec low FODMAPs diet, papers given Cut out sodas, avoid caffeine  Midline low back pain without sciatica, unspecified chronicity Do not take before driving Discussed symptomatic care -     cyclobenzaprine (FLEXERIL) 10 MG tablet; Take 1 tablet (10 mg total) by mouth 3 (three) times daily as needed for muscle spasms.  Varicose vein of leg, postpartum Painful with movements  at times No redness, no signs of infection or inflammation -     Ambulatory referral to Vascular Surgery  Gestational diabetes mellitus, class A2/B A1c 5.7 since delivery 10 mo ago  BMI 64 Continue lifestyle changes Stop sodas, walk regularly  Follow up plan: 3 mo Assunta Found, MD [place]

## 2016-10-09 NOTE — Patient Instructions (Signed)
Patient does have a history of gestational diabetes

## 2016-10-15 ENCOUNTER — Encounter: Payer: Self-pay | Admitting: *Deleted

## 2016-10-15 ENCOUNTER — Telehealth: Payer: Self-pay | Admitting: Pediatrics

## 2016-10-15 LAB — HM DIABETES EYE EXAM

## 2016-10-15 NOTE — Telephone Encounter (Signed)
Aware letter ready per message left on voice mail.

## 2016-11-04 ENCOUNTER — Ambulatory Visit (INDEPENDENT_AMBULATORY_CARE_PROVIDER_SITE_OTHER): Payer: Medicaid Other | Admitting: Pharmacist

## 2016-11-04 VITALS — Ht 62.0 in | Wt 353.0 lb

## 2016-11-04 DIAGNOSIS — Z6841 Body Mass Index (BMI) 40.0 and over, adult: Secondary | ICD-10-CM

## 2016-11-04 DIAGNOSIS — E6609 Other obesity due to excess calories: Secondary | ICD-10-CM

## 2016-11-04 DIAGNOSIS — E11319 Type 2 diabetes mellitus with unspecified diabetic retinopathy without macular edema: Secondary | ICD-10-CM | POA: Insufficient documentation

## 2016-11-04 DIAGNOSIS — R7303 Prediabetes: Secondary | ICD-10-CM | POA: Diagnosis not present

## 2016-11-04 DIAGNOSIS — H3500 Unspecified background retinopathy: Secondary | ICD-10-CM

## 2016-11-04 DIAGNOSIS — IMO0001 Reserved for inherently not codable concepts without codable children: Secondary | ICD-10-CM

## 2016-11-04 MED ORDER — GLUCOSE BLOOD VI STRP: ORAL_STRIP | 12 refills | Status: DC

## 2016-11-04 MED ORDER — ACCU-CHEK FASTCLIX LANCETS MISC: 1.0000 | Freq: Every day | 2 refills | Status: DC

## 2016-11-04 MED ORDER — METFORMIN HCL ER 500 MG PO TB24: 500.0000 mg | ORAL_TABLET | Freq: Every day | ORAL | 2 refills | Status: DC

## 2016-11-04 NOTE — Progress Notes (Signed)
Patient ID: Heidi Landry, female   DOB: 1986/09/23, 30 y.o.   MRN: [id number]    Subjective:     Heidi Landry is a 30 y.o. female is here today for follow up diet / weight loss / obesity and pre diabetesf.  Patient cites health, increased physical ability, self-image as reasons for wanting to lose weight.  She has been following recommended diet some days but she has not been exercising because she is concerned to take her 25 month old son out in cold weather.   Patient had gestational diabetes and most recent A1c indicated pre diabetes.  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   Current Eating Habits Number of regular meals per day: 2 Number of snacking episodes per day: 4 Who shops for food? patient Who prepares food? patient Who eats with patient? patient and husband Eating precipitated by stress? yes -   Guilt feelings associated with eating? yes -   Patient has been limiting portion sizes. At our last visit she had stopped drinking regular sodas.  She reports today that she started drinking Pepsi Zero recently.    Other Potential Contributing Factors Use of alcohol: average 0 drinks/week Use of medications that may cause weight gain estrogens (BCPs)  Psych History: depression  Increased stress related to possibility of moving soon, son's health and problems with automobile. Comorbidities: GERD and hypertension The following portions of the patient's history were reviewed and updated as appropriate: allergies, current medications, past family history, past medical history, past social history, past surgical history and  problem list.      Objective:    Ht 5' 2"  (1.575 m)   Wt (!) 353 lb (160.1 kg)   BMI 64.56 kg/m  Body mass index is 64.56 kg/m.   Negative edema bialterally A1c = 5.7% (09/02/216)   Assessment:    Obesity with BMI and comorbidities as noted above - weight is stable compared to 1 month ago but up 10 lbs compared to 3 months ago Pre diabetes Patient readiness to commit to diet and activity changes: fair Barriers to weight loss: stress ( ) and time limitations    Plan:    1. Start metformin XR 58m take 1 tablet daily.  Discussed with patient then improvement in insulin resistance with metformin can lead to more regular menstrual cycles and increase in fertility.  She is currently on BCPs 2. Continue current diet interventions: low calorie (1000 kCal/d) deficit diet.    No regular soda  Increase frequency of meals and snacks but make servings / size smaller.  Calorie recommendations is 500 or fewer calories per day 3. Rx sent in for glucose test strips and lancets - reviewed BG goals with patient 4.  Restart walking - start with 20 to 30 minutes and work up as able (advised she can take her son with her as long as he wears coat and mitten/gloves when needed) 5.  Follow up: 4 weeks to reassess weight and BG    TCherre Robins PharmD, CPP, CDE

## 2016-11-04 NOTE — Patient Instructions (Signed)
Start metformin ER 586m - take 1 tablet once a day with food (this is to help with your blood glucose / pre diabetes)  Goal Blood glucose:    Fasting (before meals) = 80 to 130   Within 2 hours of eating = less than 180  Try to have no more than 3 of these foods per meal (or 45 grams of carbohydrates or less):  Fruit:   1/2 cup or once piece (baseball size)- apples, pears, pineapple, peaches, oranges  1 cup - berries, melons  1/2 banana or grapefruit  Stachy Vegetables:   1/2 cup potatoes (white or sweet), corn, peas  Other starches:   1 piece of bread  1/2 cup rice or pasta  4 inch pancake    These foods you can eat more freely: Proteins:   Fish  Chicken or t[place] Beef or pork (1 or 2 servings per week)  Eggs  Nuts (peanuts, walnuts, almonds, pistachios)  Cheese - low fat  Non starchy vegetables:  Green beans  Broccoli or cauliflower  Lettuce, greens, cabbage, spinach  Brussel Sprout  Carrots  Onions and peppers  Celery  Tomatoes  Asparagus  Eggplant  Cucumbers  Squash and Zucchini    Prediabetes Many people have heard about type 2 diabetes, but its common precursor, prediabetes, doesn't get as much attention. Prediabetes is estimated by CDC to affect 86 million Americans (this includes 51% of people 65 years and older), and an estimated 90% of people with prediabetes don't even know it. According to the CDC, 15-30% of these individuals will develop type 2 diabetes within five years. In other words, as many as 26 million people that currently have prediabetes could develop type 2 diabetes by 2020, effectively doubling the number of people with type 2 diabetes in the [place]  What is prediabetes? Prediabetes is a condition where blood sugar levels are higher than normal, but not high enough to be diagnosed as type 2 diabetes. This occurs when the body has problems in processing glucose properly, and sugar starts to build up in the bloodstream instead of fueling cells  in muscles and tissues. Insulin is the hormone that tells cells to take up glucose, and in prediabetes, people typically initially develop insulin resistance (where the body's cells can't respond to insulin as well), and over time (if no actions are taken to reverse the situation) the ability to produce sufficient insulin is reduced. People with prediabetes also commonly have high blood pressure as well as abnormal blood lipids (e.g. cholesterol). These often occur prior to the rise of blood glucose levels.  What are the symptoms of prediabetes? People typically do not have symptoms of prediabetes, which is partially why up to 90% of people don't know they have it. The ADA reports that some people with prediabetes may develop symptoms of type 2 diabetes, though even many people diagnosed with type 2 diabetes show little or no symptoms initially at diagnosis.  How is prediabetes diagnosed? According to the American Diabetes Association, prediabetes can be diagnosed through one of the following tests: 1. A glycated hemoglobin test, also known as HbA1c or simply A1c, gives an idea of the body's average blood sugar levels from the past two or three months. It is usually done with a small drop of blood from a fingerstick or as part of having blood taken in a doctor's office, hospital, or laboratory. A1c Level Diagnosis  Less than 5.7% Normal  5.7% to 6.4% Prediabetes  6.5% and higher Diabetes  2. A fasting plasma glucose (FPG) test measures a person's blood glucose level after fasting (not eating) for eight hours - this is typically done in the morning. If a test shows positive for prediabetes, a second test should be taken on a different day to confirm the diagnosis. FPG Level Diagnosis  Less than 100 mg/dl Normal  100 mg/dl to 125 mg/dl Prediabetes  126 mg/dl and higher Diabetes   Who is at risk of developing prediabetes? A well-known paper published in the Lancet in 2010 recommends screening for type 2  diabetes (which would also screen for prediabetes) every 3-5 years in all adults over the age of 44, regardless of other risk factors. Overweight and obese adults (a BMI >25 kg/m2) are also at significantly greater risk for developing prediabetes, as well as people with a family history of type 2 diabetes. According to the CDC, several other factors can have moderate influences on prediabetes risk in addition to age, weight, and family history: People with an [place] American, Hispanic/Latino, American [place] American, or [place] racial or ethnic background. The 2015 ADA Standards of Medical Care recommendations suggest Asian Americans with a BMI of 23 or above be screened for type 2 diabetes.  Women with a history of diabetes during pregnancy ("gestational diabetes") or have given birth to a baby weighing nine pounds or more. People who are physically active fewer than three times a week. The CDC offers a fast, online screening test for evaluating the risk for prediabetes. The ADA also offers a screening test to assess type 2 diabetes risk. Of course, these tests do not themselves confirm a prediabetes diagnosis, but just if someone may be at higher risk of developing it.  Why do people develop prediabetes? Prediabetes develops through a combination of factors that are still being investigated. For sure, lifestyle factors (food, exercise, stress, sleep) play a role, but family history and genetics certainly do as well. It is easy to assume that prediabetes is the result of being overweight, but the relationship is not that simple. While obesity is one underlying cause of insulin resistance, many overweight individuals may never develop prediabetes or type 2 diabetes, and a minority of people with prediabetes have never been overweight. To make matters worse, it can be increasingly difficult to make healthy choices in today's toxic food environment that steers all of [place] to make the wrong food  choices, and there are many factors that can contribute to weight gain in addition to diet.  Is a prediabetes diagnosis serious? There has been significant debate around the term 'prediabetes,' and whether it should be considered cause for alarm. On the one hand, it serves as a risk factor for type 2 diabetes and a host of other complications, including heart disease, and ultimately prediabetes implies that a degree of metabolic problems have started to occur in the body. On the other hand, it places a diagnosis on many people who may never develop type 2 diabetes. Again, according to the CDC, 15-30% of those with prediabetes will develop type 2 diabetes within five years. However, a 2012 Lancet article cites 5-10% of those with prediabetes each year will also revert back to healthy blood sugars. What's critical is not necessarily the cutoff itself, but where someone falls within the ranges listed above. The level of risk of developing type 2 diabetes is closely related to A1c or FPG at diagnosis. Those in the higher ranges (A1c closer to 6.4%, FPG closer to 125 mg/dl) are much  more likely to progress to type 2 diabetes, whereas those at lower ranges (A1c closer to 5.7%, FPG closer to 100 mg/dl) are relatively more likely to revert back to normal glucose levels or stay within the prediabetes range. Age of diagnosis and the level of insulin production still occurring at diagnosis also impact the chances of reverting to normoglycemia (normal blood sugar levels).  What can people with prediabetes do to avoid the progression from prediabetes to type 2 diabetes? The most important action people diagnosed with prediabetes can take is to focus on living a healthy lifestyle. This includes making healthy food choices, controlling portions, and increasing physical activity. Regarding weight control, research shows losing 5-7% (often about 10-20 lbs.) from your initial body weight and keeping off as much of that weight  over time as possible is critical to lowering the risk of type 2 diabetes. This task is of course easier said than done, but sustained weight loss over time can be key to improving health and delaying or preventing the onset of type 2 diabetes. Several prediabetes interventions exist based on evidence from the landmark Diabetes Prevention Program (DPP) study. The DPP study reported that moderate weight loss (5-7% of body weight, or ~10-15 lbs. for someone weighing 200 lbs.), counseling, and education on healthy eating and behavior reduced the risk of developing type 2 diabetes by 58%. Data presented at the [place] 2014 conference showed that after 15 years of follow-up of the DPP study groups, the results were still encouraging: 27% of those in the original lifestyle group had a significant reduction in type 2 diabetes progression compared to the control group. If you or someone you know has been told they have prediabetes, here are a few helpful resources: In-person diabetes prevention programs: The CDC offers a one year long lifestyle change program through its National Diabetes Prevention Program (NDPP) at various locations throughout the [place] to help participants adopt healthy habits and prevent or delay progression to type 2 diabetes. This program is a major undertaking by the CDC to translate the findings from the DPP study into a real world setting, a significant effort indeed! Online diabetes prevention programs: The CDC has now given pending recognition status to three digital prevention programs: [place], and [facility]. These offer the same one year long educational curriculum as the DPP study, but in an online format. Some insurance companies and employers cover these programs, and you can find more information at the links above. These digital versions are excellent options for those who live far away from NDPP locations or who prefer the anonymity and convenience of doing the program  online. Metformin: The DPP study found that metformin, the safest first-line therapy for type 2 diabetes, may help delay the onset of type 2 diabetes in people with prediabetes. Participants who took the low-cost generic drug had a 31% reduced risk of developing type 2 diabetes compared to the control group (those not on metformin or intensive lifestyle intervention). Again, 15-year follow up data showed that 17% of those on metformin continued to have a significant reduction in type 2 progression. At this time, metformin (or any other medication, for that matter) is not currently FDA approved for prediabetes, and it is sometimes prescribed "off-label" by a healthcare provider. Your healthcare provider can give you more information and determine whether metformin is a good option for you.  Can prediabetes be "cured"? In the early stages of prediabetes (and type 2 diabetes), diligent attention to food choices  and activity, and most importantly weight loss, can improve blood sugar numbers, effectively "reversing" the disease and reducing the odds of developing type 2 diabetes. However, some people may have underlying factors (such as family history and genetics) that put them at a greater risk of type 2 diabetes, meaning they will always require careful attention to blood sugar levels and lifestyle choices. Returning to old habits will likely put someone back on the road to prediabetes, and eventually, type 2 diabetes

## 2016-11-05 ENCOUNTER — Encounter: Payer: Self-pay | Admitting: Pharmacist

## 2016-12-09 ENCOUNTER — Encounter: Payer: Self-pay | Admitting: Vascular Surgery

## 2016-12-11 ENCOUNTER — Other Ambulatory Visit: Payer: Self-pay | Admitting: Advanced Practice Midwife

## 2016-12-16 ENCOUNTER — Ambulatory Visit (INDEPENDENT_AMBULATORY_CARE_PROVIDER_SITE_OTHER): Payer: Medicaid Other | Admitting: Pharmacist

## 2016-12-16 VITALS — BP 120/70 | HR 90 | Ht 62.0 in | Wt 358.0 lb

## 2016-12-16 DIAGNOSIS — R7303 Prediabetes: Secondary | ICD-10-CM

## 2016-12-16 DIAGNOSIS — Z6841 Body Mass Index (BMI) 40.0 and over, adult: Secondary | ICD-10-CM

## 2016-12-16 DIAGNOSIS — I10 Essential (primary) hypertension: Secondary | ICD-10-CM | POA: Diagnosis not present

## 2016-12-16 DIAGNOSIS — E6609 Other obesity due to excess calories: Secondary | ICD-10-CM

## 2016-12-16 DIAGNOSIS — IMO0001 Reserved for inherently not codable concepts without codable children: Secondary | ICD-10-CM

## 2016-12-16 LAB — GLUCOSE HEMOCUE WAIVED: Glu Hemocue Waived: 138 mg/dL — ABNORMAL HIGH (ref 65–99)

## 2016-12-16 LAB — BAYER DCA HB A1C WAIVED: HB A1C (BAYER DCA - WAIVED): 6.3 % (ref <7.0)

## 2016-12-16 MED ORDER — METFORMIN HCL ER 500 MG PO TB24: 1000.0000 mg | ORAL_TABLET | Freq: Every day | ORAL | 1 refills | Status: DC

## 2016-12-16 MED ORDER — METOPROLOL SUCCINATE ER 25 MG PO TB24: 25.0000 mg | ORAL_TABLET | Freq: Every day | ORAL | 2 refills | Status: DC

## 2016-12-16 NOTE — Progress Notes (Signed)
Patient ID: Heidi Landry, female   DOB: March 12, 1986, 30 y.o.   MRN: [id number]   Subjective:     Heidi Landry is a 30 y.o. female is here today for follow up diet / weight loss / obesity and pre diabetes.  Heidi Landry admits that following prescribed diet has been harder lately as her family is having a difficulty time with finances.  Patient cites health, increased physical ability, self-image as reasons for wanting to lose weight.  Patient had gestational diabetes and last A1c indicated pre diabetes.  At our last visit started metformin XR 531m qd - tolerating well.     Heidi Landry also c/o bilateral hand pain and numbness. She has been seeing specialist for carpel tunnel.  They have recommended surgery but trying to wait until her son is walking (just turned 114year old).  She is due to follow up with specialist 01/10/2016.  Obesity History Lowest adult weight: 250 Highest adult weight: 354 Lost down to 310# while pregnant when she stopped drinking soda and switched to water   History of Weight Loss Efforts Tried Atkins Diet - lost about 30 lbs Worked out at [employer]- lost [employer] past and lost a "little bit" of weight.   Current Eating Habits Number of regular meals per day: 2 Number of snacking episodes per day: 0 Who shops for food? patient Who prepares food? patient Who eats with patient? patient and husband Eating precipitated by stress? yes -   Guilt feelings associated with eating? yes -   Not eating breakfast.  Usually 2 meals per day.  No snacks. Limiting serving sizes but has been unable to afford am many fresh vegetable or fruits.  She is getting help from local food bank and also receives food stamps.  Per patient she has decreases soda - none in 2 weeks. she has not been exercising because she is concerned to take her 177month old son out in cold weather.     Other Potential Contributing Factors Use of alcohol: average 0 drinks/week Use of medications that may cause  weight gain estrogens (BCPs)  Psych History: depression  Increased stress related to possibility of moving soon, son's health and problems with automobile. Comorbidities: GERD and hypertension The following portions of the patient's history were reviewed and updated as appropriate: allergies, current medications, past family history, past medical history, past social history, past surgical history and problem list.    Objective:    BP 120/70   Pulse 90   Ht 5' 2"  (1.575 m)   Wt (!) 358 lb (162.4 kg)   BMI 65.48 kg/m  Body mass index is 65.48 kg/m.   A1c = 6.3% (today) RBG = 138 (today) A1c = 5.7% (09/02/216)   Assessment:    Obesity with BMI and comorbidities as noted above - weight is increasing compared to 1 month ago  Pre diabetes - A1c increasing despite starting metformin Patient readiness to commit to diet and activity changes: fair Barriers to weight loss: stress (related to finances), time limitations and finances    Plan:    1. Increase metformin XR 502mtake 2 tablets daily. 2. Continue current diet interventions: low calorie (1000 kCal/d) deficit diet.    No regular soda  Will get some low cost but low calorie recipes for patient to try.   Increase frequency of meals and snacks (recommend eat every 3 to 4 hours during day) 3.  Encouraged her to start walking - start with 20 to  30 minutes and work up as able (advised she can take her son with her as long as he wears coat and mitten/gloves when needed) 4.  Can try OTC IBU as needed for pain for carpel tunnel.  5.  Follow up: 4 weeks to reassess weight and BG    Cherre Robins, PharmD, CPP, CDE

## 2016-12-16 NOTE — Patient Instructions (Signed)
Try ibuprofen up to 633m every 6 hours as needed for wrist pain for 3 days.  If no relief than call for appointment with Dr VEvette Doffing

## 2016-12-18 ENCOUNTER — Encounter: Payer: Self-pay | Admitting: Vascular Surgery

## 2016-12-19 ENCOUNTER — Telehealth: Payer: Self-pay | Admitting: Pediatrics

## 2016-12-19 NOTE — Telephone Encounter (Signed)
Discussed recent labs with patient and medication changes

## 2016-12-23 ENCOUNTER — Encounter: Payer: Self-pay | Admitting: Vascular Surgery

## 2016-12-23 ENCOUNTER — Ambulatory Visit (INDEPENDENT_AMBULATORY_CARE_PROVIDER_SITE_OTHER): Payer: Medicaid Other | Admitting: Vascular Surgery

## 2016-12-23 VITALS — BP 150/98 | HR 95 | Resp 18 | Ht 62.0 in | Wt 355.0 lb

## 2016-12-23 DIAGNOSIS — I83892 Varicose veins of left lower extremities with other complications: Secondary | ICD-10-CM

## 2016-12-23 HISTORY — DX: Varicose veins of left lower extremity with other complications: I83.892

## 2016-12-23 NOTE — Progress Notes (Signed)
Subjective:     Patient ID: Heidi Landry, female   DOB: 1986/06/17, 30 y.o.   MRN: [id number]  HPI This 30 year old female was referred by Dr. Evette Doffing for painful varicose vein left leg. This morbidly obese female has no history of DVT thrombophlebitis stasis ulcers or bleeding. She does not were elastic compression stockings. She has noted bulging varicosities in her left anterior and lateral thigh for many years as well as her posterior calf for many years. Symptoms are much less are on the right side. She has been trying to lose weight that of no avail . She was referred for evaluation of these varicose veins. She denies any severe pain in the varicosities.  Past Medical History:  Diagnosis Date  . Anxiety   . Atrial fibrillation ([place])   . Carpal tunnel syndrome, bilateral   . Chest pain   . Chronic back pain   . Degenerative disc disease   . Edema   . Gestational diabetes mellitus, antepartum   . Hypertension   . IBS (irritable bowel syndrome)   . Panic attacks   . Post partum depression   . Sleep apnea   . Spondylosis   . Tendonitis     Social History  Substance Use Topics  . Smoking status: Former Smoker    Packs/day: 1.00    Years: 9.00    Types: Cigarettes    Start date: 05/25/2006    Quit date: 01/04/2016  . Smokeless tobacco: Never Used  . Alcohol use No    Family History  Problem Relation Age of Onset  . Fibromyalgia Mother   . Cancer Father     lung, colon   . Hernia Father   . Diabetes Maternal Grandmother   . Hypertension Maternal Grandmother   . Cancer Paternal Uncle   . Brain cancer Paternal Uncle   . Throat cancer Paternal Uncle   . Thyroid disease Paternal Aunt     No Known Allergies   Current Outpatient Prescriptions:  .  cetirizine (ZYRTEC) 10 MG tablet, Take 10 mg by mouth daily. , Disp: , Rfl:  .  CONDYLOX 0.5 % gel, APPLY TOPICALLY TO ACCESSIBLE WARTS TWICE DAILY. (Patient taking differently: APPLY TOPICALLY TO ACCESSIBLE WARTS TWICE DAILY.  as needed), Disp: 3.5 g, Rfl: 0 .  cyclobenzaprine (FLEXERIL) 10 MG tablet, Take 1 tablet (10 mg total) by mouth 3 (three) times daily as needed for muscle spasms., Disp: 30 tablet, Rfl: 0 .  fluticasone (FLONASE) 50 MCG/ACT nasal spray, Place 1 spray into both nostrils daily., Disp: , Rfl:  .  glucose blood (ACCU-CHEK AVIVA) test strip, Use to check blood glucose once daily or every other day., Disp: 50 each, Rfl: 12 .  ibuprofen (ADVIL,MOTRIN) 200 MG tablet, Take 1,000 mg by mouth every 6 (six) hours as needed., Disp: , Rfl:  .  metFORMIN (GLUCOPHAGE-XR) 500 MG 24 hr tablet, Take 2 tablets (1,000 mg total) by mouth daily with breakfast., Disp: 68 tablet, Rfl: 1 .  metoprolol succinate (TOPROL-XL) 25 MG 24 hr tablet, Take 1 tablet (25 mg total) by mouth daily. Take with or immediately following a meal., Disp: 30 tablet, Rfl: 2 .  metroNIDAZOLE (FLAGYL) 500 MG tablet, Take 500 mg by mouth 2 (two) times daily as needed. , Disp: , Rfl:  .  norethindrone (ORTHO MICRONOR) 0.35 MG tablet, Take 1 tablet (0.35 mg total) by mouth daily., Disp: 1 Package, Rfl: 11 .  omeprazole (PRILOSEC) 20 MG capsule, Take 1 capsule (20 mg total) by  mouth daily. 1 tablet a day, Disp: 30 capsule, Rfl: 6 .  Prenatal Vit-Fe Fumarate-FA (PRENATAL VITAMIN PLUS LOW IRON) 27-1 MG TABS, TAKE ONE TABLET BY MOUTH ONCE DAILY, Disp: 30 tablet, Rfl: 11 .  sertraline (ZOLOFT) 100 MG tablet, Take 1 tablet (100 mg total) by mouth daily. Take 1 tablet daily, Disp: 30 tablet, Rfl: 2 .  spironolactone (ALDACTONE) 100 MG tablet, Take 1 tablet (100 mg total) by mouth daily., Disp: 90 tablet, Rfl: 1 .  ACCU-CHEK FASTCLIX LANCETS MISC, 1 each by Does not apply route daily. Use to check BG daily or every other day (Patient not taking: Reported on 12/23/2016), Disp: 102 each, Rfl: 2 .  Ascorbic Acid (VITAMIN C) 1000 MG tablet, Take 1,000 mg by mouth daily., Disp: , Rfl:   Vitals:   12/23/16 1143  BP: (!) 150/98  Pulse: 95  Resp: 18  SpO2: 98%   Weight: (!) 355 lb (161 kg)  Height: 5' 2"  (1.575 m)    Body mass index is 64.93 kg/m.         Review of Systems Denies chest pain. Does have history of atrial fibrillation, sleep apnea, morbid obesity, GERD.    Objective:   Physical Exam BP (!) 150/98 (BP Location: Left Arm, Patient Position: Sitting, Cuff Size: Normal)   Pulse 95   Resp 18   Ht 5' 2"  (1.575 m)   Wt (!) 355 lb (161 kg)   SpO2 98%   BMI 64.93 kg/m     Gen.-alert and oriented x3 in no apparent distress-morbidly obese HEENT normal for age Lungs no rhonchi or wheezing Cardiovascular regular rhythm no murmurs carotid pulses 3+ palpable no bruits audible Abdomen soft nontender no palpable masses-morbidly obese  Musculoskeletal free of  major deformities Skin clear -no rashes Neurologic normal Lower extremities 3+ femoral and dorsalis pedis pulses palpable bilaterally with no edema Left leg with bulging varicosities in the anterior thigh extending medial to lateral and also in the posterior calf. No hyperpigmentation or ulceration noted Right leg with some smaller varicosities in the medial calf and diffuse spider veins in both lower extremities.  Today I did perform a bedside SonoSite ultrasound exam of the left leg which revealed a normal-sized small saphenous vein with no reflux. 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 left great saphenous vein Patient also likely has gross reflux in the system although formal venous reflux exam is not performed today No complications from her venous disease have occurred Morbid obesity History of atrial fibrillation    Plan:    would not recommend any treatment such is laser ablation of left great saphenous vein with multiple stab phlebectomy in this patient now because of her morbid obesity with height of 5 feet 2 inches and BMI of 64 with  weight 355 pounds Discussed with her the need for weight loss and if that could occur and significant fashion then reconsideration for treatment could be performed but would not recommend treatment at this time She agrees and understands this

## 2017-01-21 ENCOUNTER — Ambulatory Visit (INDEPENDENT_AMBULATORY_CARE_PROVIDER_SITE_OTHER): Payer: Medicaid Other | Admitting: Pediatrics

## 2017-01-21 ENCOUNTER — Encounter: Payer: Self-pay | Admitting: Pediatrics

## 2017-01-21 VITALS — BP 127/75 | HR 107 | Temp 99.0°F | Ht 62.0 in | Wt 351.4 lb

## 2017-01-21 DIAGNOSIS — Z6841 Body Mass Index (BMI) 40.0 and over, adult: Secondary | ICD-10-CM

## 2017-01-21 DIAGNOSIS — F411 Generalized anxiety disorder: Secondary | ICD-10-CM | POA: Diagnosis not present

## 2017-01-21 DIAGNOSIS — I10 Essential (primary) hypertension: Secondary | ICD-10-CM

## 2017-01-21 MED ORDER — SERTRALINE HCL 50 MG PO TABS: 150.0000 mg | ORAL_TABLET | Freq: Every day | ORAL | 4 refills | Status: DC

## 2017-01-21 NOTE — Progress Notes (Signed)
  Subjective:   Patient ID: Heidi Landry, female    DOB: [birth year], 31 y.o.   MRN: [id number] CC: Follow-up (BP)  HPI: Petrina Melby is a 31 y.o. female presenting for Follow-up (BP)  Has been eating mostly fruit, veg, some meat Avoiding carb Drinking sodas apprx 1 time a week Has been drinking coffee and water 3-4 cups of coffee, 3 tablespoons of sugar in the coffee  Feels more stressed Son has had URIs, been up more at night Taking zoloft regularly Crying more than usual Feels safe at home No thoughts of self harm  HTN: no chest pain or SOB Taking metoprolol daily  Gets restless, has hard time sleeping at night Hard time turning off thoughts at night  Episode of diarrhea after eating last night, had buttery rue sauce  Relevant past medical, surgical, family and social history reviewed. Allergies and medications reviewed and updated. History  Smoking Status  . Former Smoker  . Packs/day: 1.00  . Years: 9.00  . Types: Cigarettes  . Start date: 05/25/2006  . Quit date: 01/04/2016  Smokeless Tobacco  . Never Used   ROS: Per HPI   Objective:    BP 127/75   Pulse (!) 107   Temp 99 F (37.2 C) (Oral)   Ht 5' 2"  (1.575 m)   Wt (!) 351 lb 6.4 oz (159.4 kg)   BMI 64.27 kg/m   Wt Readings from Last 3 Encounters:  01/21/17 (!) 351 lb 6.4 oz (159.4 kg)  12/23/16 (!) 355 lb (161 kg)  12/16/16 (!) 358 lb (162.4 kg)    Gen: NAD, alert, cooperative with exam, NCAT EYES: EOMI, no conjunctival injection, or no icterus CV: NRRR, normal S1/S2, no murmur Resp: CTABL, no wheezes, normal WOB Abd: +BS, soft, obese, pannus somewhat woody on exam, NT. no guarding or organomegaly Ext: No edema, warm Neuro: Alert and oriented, strength equal b/l UE and LE, coordination grossly normal MSK: normal muscle bulk Psych: normal affect, no thoughts of self harm  Assessment & Plan:  Tsion was seen today for follow-up.  Diagnoses and all orders for this visit:  Essential  hypertension Improved control Cont metoprolol  Generalized anxiety disorder Worsening symptoms, safe at home, able to take care of son, increased crying episodes Increase sertraline to 193m -     sertraline (ZOLOFT) 50 MG tablet; Take 3 tablets (150 mg total) by mouth daily. Take 1 tablet daily  BMI 60.0-69.9, adult (HCC) Cont lifestyle changes Decrease sugar intake in coffee, has decreased soda intake Drink mostly water, can put milk or 1-2 teaspoons of sugar in coffee or sugar substitute  Walk daily with [place]/son  Follow up plan: Return in about 3 months (around 04/21/2017). CAssunta Found MD [facility]

## 2017-01-23 ENCOUNTER — Telehealth: Payer: Self-pay | Admitting: Pediatrics

## 2017-01-23 NOTE — Telephone Encounter (Signed)
Call to [facility] to clarify pt should be taking Sertraline 55m  3tablets daily per Dr VEvette Doffing

## 2017-01-26 ENCOUNTER — Ambulatory Visit (INDEPENDENT_AMBULATORY_CARE_PROVIDER_SITE_OTHER): Payer: Medicaid Other | Admitting: Obstetrics & Gynecology

## 2017-01-26 ENCOUNTER — Encounter: Payer: Self-pay | Admitting: Obstetrics & Gynecology

## 2017-01-26 VITALS — BP 130/80 | HR 82 | Wt 356.8 lb

## 2017-01-26 DIAGNOSIS — R1030 Lower abdominal pain, unspecified: Secondary | ICD-10-CM | POA: Diagnosis not present

## 2017-01-26 DIAGNOSIS — N83202 Unspecified ovarian cyst, left side: Secondary | ICD-10-CM | POA: Diagnosis not present

## 2017-01-26 DIAGNOSIS — N83201 Unspecified ovarian cyst, right side: Secondary | ICD-10-CM | POA: Diagnosis not present

## 2017-01-26 MED ORDER — MEGESTROL ACETATE 40 MG PO TABS: 40.0000 mg | ORAL_TABLET | Freq: Every day | ORAL | 1 refills | Status: DC

## 2017-01-26 NOTE — Progress Notes (Signed)
Chief Complaint  Patient presents with  . sharpe pain ovary area    Blood pressure 130/80, pulse 82, weight (!) 356 lb 12.8 oz (161.8 kg), last menstrual period 01/02/2017, not currently breastfeeding.  31 y.o. G1P0101 Patient's last menstrual period was 01/02/2017. The current method of family planning is oral progesterone-only contraceptive.  Outpatient Encounter Prescriptions as of 01/26/2017  Medication Sig  . ACCU-CHEK FASTCLIX LANCETS MISC 1 each by Does not apply route daily. Use to check BG daily or every other day  . Ascorbic Acid (VITAMIN C) 1000 MG tablet Take 1,000 mg by mouth daily.  . cetirizine (ZYRTEC) 10 MG tablet Take 10 mg by mouth daily.   . cyclobenzaprine (FLEXERIL) 10 MG tablet Take 1 tablet (10 mg total) by mouth 3 (three) times daily as needed for muscle spasms.  . fluticasone (FLONASE) 50 MCG/ACT nasal spray Place 1 spray into both nostrils daily.  Marland Kitchen glucose blood (ACCU-CHEK AVIVA) test strip Use to check blood glucose once daily or every other day.  . ibuprofen (ADVIL,MOTRIN) 200 MG tablet Take 1,000 mg by mouth every 6 (six) hours as needed.  . metFORMIN (GLUCOPHAGE-XR) 500 MG 24 hr tablet Take 2 tablets (1,000 mg total) by mouth daily with breakfast.  . metoprolol succinate (TOPROL-XL) 25 MG 24 hr tablet Take 1 tablet (25 mg total) by mouth daily. Take with or immediately following a meal.  . norethindrone (ORTHO MICRONOR) 0.35 MG tablet Take 1 tablet (0.35 mg total) by mouth daily.  Marland Kitchen omeprazole (PRILOSEC) 20 MG capsule Take 1 capsule (20 mg total) by mouth daily. 1 tablet a day  . Prenatal Vit-Fe Fumarate-FA (PRENATAL VITAMIN PLUS LOW IRON) 27-1 MG TABS TAKE ONE TABLET BY MOUTH ONCE DAILY  . sertraline (ZOLOFT) 50 MG tablet Take 3 tablets (150 mg total) by mouth daily. Take 1 tablet daily  . spironolactone (ALDACTONE) 100 MG tablet Take 1 tablet (100 mg total) by mouth daily.  . megestrol (MEGACE) 40 MG tablet Take 1 tablet (40 mg total) by mouth  daily.  . [DISCONTINUED] CONDYLOX 0.5 % gel APPLY TOPICALLY TO ACCESSIBLE WARTS TWICE DAILY. (Patient taking differently: APPLY TOPICALLY TO ACCESSIBLE WARTS TWICE DAILY. as needed)  . [DISCONTINUED] metroNIDAZOLE (FLAGYL) 500 MG tablet Take 500 mg by mouth 2 (two) times daily as needed.    No facility-administered encounter medications on file as of 01/26/2017.     Subjective Pt has IBS diarrhea for now Having some LLQ/RLQ pain on micronor with normal periods Has history of ovarian cysts in past No dyspareunia  Objective General [facility] female NAD Vulva:  normal appearing vulva with no masses, tenderness or lesions Vagina:  normal mucosa, no discharge Cervix:  no cervical motion tenderness and no lesions Uterus:  normal size, contour, position, consistency, mobility, non-tender Adnexa: ovaries:present,  normal adnexa in size, nontender and no masses   Pertinent ROS No burning with urination, frequency or urgency No nausea, vomiting or diarrhea Nor fever chills or other constitutional symptoms   Labs or studies     Impression Diagnoses this Encounter::   ICD-9-CM ICD-10-CM   1. Cysts of both ovaries 620.2 N83.201 US Pelvis Complete    N83.202 US Transvaginal Non-OB    Established relevant diagnosis(es):   Plan/Recommendations: Meds ordered this encounter  Medications  . megestrol (MEGACE) 40 MG tablet    Sig: Take 1 tablet (40 mg total) by mouth daily.    Dispense:  30 tablet    Refill:  1  Labs or Scans Ordered: Orders Placed This Encounter  Procedures  . US Pelvis Complete  . US Transvaginal Non-OB    Management:: Megestrol Sonogram 1 months  Follow up Return in about 1 month (around 02/25/2017) for GYN sono, with Dr Elonda Husky.        Face to face time:  15 minutes  Greater than 50% of the visit time was spent in counseling and coordination of care with the patient.  The summary and outline of the counseling and care coordination is summarized in  the note above.   All questions were answered.  Past Medical History:  Diagnosis Date  . Anxiety   . Atrial fibrillation ([place])   . Carpal tunnel syndrome, bilateral   . Chest pain   . Chronic back pain   . Degenerative disc disease   . Edema   . Gestational diabetes mellitus, antepartum   . Hypertension   . IBS (irritable bowel syndrome)   . Panic attacks   . Post partum depression   . Sleep apnea   . Spondylosis   . Tendonitis     Past Surgical History:  Procedure Laterality Date  . CESAREAN SECTION N/A 12/08/2015   Procedure: CESAREAN SECTION;  Surgeon: Jonnie Kind, MD;  Location: [facility];  Service: Obstetrics;  Laterality: N/A;  . CHOLECYSTECTOMY      OB History    Gravida Para Term Preterm AB Living   1 1   1   1    SAB TAB Ectopic Multiple Live Births         0 1      No Known Allergies  Social History   Social History  . Marital status: Single    Spouse name: N/A  . Number of children: N/A  . Years of education: 72   Occupational History  .  [employer]   Social History Main Topics  . Smoking status: Former Smoker    Packs/day: 1.00    Years: 9.00    Types: Cigarettes    Start date: 05/25/2006    Quit date: 01/04/2016  . Smokeless tobacco: Never Used  . Alcohol use No  . Drug use: No  . Sexual activity: Yes    Birth control/ protection: Pill   Other Topics Concern  . None   Social History Narrative  . None    Family History  Problem Relation Age of Onset  . Fibromyalgia Mother   . Cancer Father     lung, colon   . Hernia Father   . Diabetes Maternal Grandmother   . Hypertension Maternal Grandmother   . Cancer Paternal Uncle   . Brain cancer Paternal Uncle   . Throat cancer Paternal Uncle   . Thyroid disease Paternal Aunt

## 2017-02-13 ENCOUNTER — Ambulatory Visit (INDEPENDENT_AMBULATORY_CARE_PROVIDER_SITE_OTHER): Payer: Medicaid Other | Admitting: Adult Health

## 2017-02-13 ENCOUNTER — Encounter: Payer: Self-pay | Admitting: Adult Health

## 2017-02-13 VITALS — BP 138/80 | HR 99 | Ht 62.0 in | Wt 351.0 lb

## 2017-02-13 DIAGNOSIS — M7918 Myalgia, other site: Secondary | ICD-10-CM

## 2017-02-13 DIAGNOSIS — G8929 Other chronic pain: Secondary | ICD-10-CM | POA: Diagnosis not present

## 2017-02-13 DIAGNOSIS — M791 Myalgia: Secondary | ICD-10-CM

## 2017-02-13 DIAGNOSIS — I10 Essential (primary) hypertension: Secondary | ICD-10-CM | POA: Diagnosis not present

## 2017-02-13 NOTE — Patient Instructions (Signed)
Your physician recommends that you continue on your current medications as directed. Please refer to the Current Medication list given to you today.   Your physician recommends that you schedule a follow-up appointment in: as needed    Thanks for choosing [facility]!!!

## 2017-02-13 NOTE — Progress Notes (Signed)
Cardiology Office Note   Date:  02/13/2017   ID:  Heidi Landry, DOB [birth year], MRN [id number]  PCP:  Eustaquio Maize, MD  Cardiologist: Cloria Spring, NP   Chief Complaint  Patient presents with  . Chest Pain      History of Present Illness: Heidi Landry is a 31 y.o. female who presents for ongoing assessment and management of palpitations, with 48 hour Holter monitor revealing no symptoms or arrhythmias, she was started on beta blocker and decrease caffeine intake was recommended. Other history includes OSA, obesity. and hypertension. Marland Kitchen  She was last in the office by Dr. Harl Bowie on 03/27/2016.  She was continued on her current medications are due to weight gain and fluid retention echocardiogram was completed to evaluate for changes in LV function.  Left ventricle: The cavity size was normal. Wall thickness was   increased in a pattern of mild LVH. Systolic function was normal.   The estimated ejection fraction was in the range of 60% to 65%.   Left ventricular diastolic function parameters were normal. - Aortic valve: Valve area (VTI): 3.68 cm^2. Valve area (Vmax):   3.68 cm^2. Valve area (Vmean): 3.8 cm^2. - Technically difficult study.  She comes today with no complaints of chest pain and shoulder pain and neck pain. She states that she has to pick up a past heavy child every day, has also been having some transient sharp pain in her upper chest after picking up her child.   Past Medical History:  Diagnosis Date  . Anxiety   . Atrial fibrillation ([place])   . Carpal tunnel syndrome, bilateral   . Chest pain   . Chronic back pain   . Degenerative disc disease   . Edema   . Gestational diabetes mellitus, antepartum   . Hypertension   . IBS (irritable bowel syndrome)   . Panic attacks   . Post partum depression   . Sleep apnea   . Spondylosis   . Tendonitis     Past Surgical History:  Procedure Laterality Date  . CESAREAN SECTION N/A 12/08/2015   Procedure: CESAREAN SECTION;  Surgeon: Jonnie Kind, MD;  Location: [facility];  Service: Obstetrics;  Laterality: N/A;  . CHOLECYSTECTOMY       Current Outpatient Prescriptions  Medication Sig Dispense Refill  . ACCU-CHEK FASTCLIX LANCETS MISC 1 each by Does not apply route daily. Use to check BG daily or every other day 102 each 2  . cetirizine (ZYRTEC) 10 MG tablet Take 10 mg by mouth daily.     . cyclobenzaprine (FLEXERIL) 10 MG tablet Take 1 tablet (10 mg total) by mouth 3 (three) times daily as needed for muscle spasms. (Patient taking differently: Take 10 mg by mouth as needed for muscle spasms. ) 30 tablet 0  . fluticasone (FLONASE) 50 MCG/ACT nasal spray Place 1 spray into both nostrils daily.    Marland Kitchen ibuprofen (ADVIL,MOTRIN) 200 MG tablet Take 1,000 mg by mouth every 6 (six) hours as needed.    . megestrol (MEGACE) 40 MG tablet Take 1 tablet (40 mg total) by mouth daily. 30 tablet 1  . metFORMIN (GLUCOPHAGE-XR) 500 MG 24 hr tablet Take 2 tablets (1,000 mg total) by mouth daily with breakfast. 68 tablet 1  . metoprolol succinate (TOPROL-XL) 25 MG 24 hr tablet Take 1 tablet (25 mg total) by mouth daily. Take with or immediately following a meal. 30 tablet 2  . norethindrone (ORTHO MICRONOR) 0.35 MG tablet Take 1 tablet (0.35  mg total) by mouth daily. 1 Package 11  . omeprazole (PRILOSEC) 20 MG capsule Take 1 capsule (20 mg total) by mouth daily. 1 tablet a day 30 capsule 6  . Prenatal Vit-Fe Fumarate-FA (PRENATAL VITAMIN PLUS LOW IRON) 27-1 MG TABS TAKE ONE TABLET BY MOUTH ONCE DAILY 30 tablet 11  . sertraline (ZOLOFT) 50 MG tablet Take 3 tablets (150 mg total) by mouth daily. Take 1 tablet daily 90 tablet 4  . spironolactone (ALDACTONE) 100 MG tablet Take 1 tablet (100 mg total) by mouth daily. 90 tablet 1   No current facility-administered medications for this visit.     Allergies:   Patient has no known allergies.    Social History:  The patient  reports that she has quit smoking.  Her smoking use included Cigarettes. She started smoking about 10 years ago. She has a 9.00 pack-year smoking history. She has never used smokeless tobacco. She reports that she does not drink alcohol or use drugs.   Family History:  The patient's family history includes Brain cancer in her paternal uncle; Cancer in her father and paternal uncle; Diabetes in her maternal grandmother; Fibromyalgia in her mother; Hernia in her father; Hypertension in her maternal grandmother; Throat cancer in her paternal uncle; Thyroid disease in her paternal aunt.    ROS: All other systems are reviewed and negative. Unless otherwise mentioned in H&P    PHYSICAL EXAM: VS:  BP 138/80   Pulse 99   Ht 5' 2"  (1.575 m)   Wt (!) 351 lb (159.2 kg)   SpO2 97%   BMI 64.20 kg/m  , BMI Body mass index is 64.2 kg/m. GEN: Well nourished, well developed, in no acute distress  HEENT: normal  Cardiac: RRR; no murmurs, rubs, or gallops,no edema  Respiratory:  clear to auscultation bilaterally, normal work of breathing GI: soft, nontender, nondistended, + BS MS: no deformity or atrophy  Neuro:  Strength and sensation are intact Psych: euthymic mood, full affect   EKG: The ekg ordered today demonstrates normal sinus rhythm with right bundle branch block. Heart rate of 94 bpm.   Recent Labs: 03/12/2016: ALT 29; BUN 10; Creatinine, Ser 0.53; Potassium 4.1; Sodium 142 04/18/2016: TSH 1.290 05/30/2016: Platelets 219    Lipid Panel    Component Value Date/Time   CHOL 154 03/12/2016 1440   TRIG 166 (H) 03/12/2016 1440   HDL 39 (L) 03/12/2016 1440   CHOLHDL 3.9 03/12/2016 1440   LDLCALC 82 03/12/2016 1440      Wt Readings from Last 3 Encounters:  02/13/17 (!) 351 lb (159.2 kg)  01/26/17 (!) 356 lb 12.8 oz (161.8 kg)  01/21/17 (!) 351 lb 6.4 oz (159.4 kg)      ASSESSMENT AND PLAN:  1.  Recurrent chest pain: Appears musculoskeletal in etiology. More neck and bilateral shoulder pain with occasional transient  sharp pain across the upper part of her chest. She states that she has a very heavy child which peaks of daily. She has chronic back pain. She is being followed by a chiropractor who is evaluating her more thoroughly concerning musculoskeletal issues. I have given her reassurance that her echocardiogram did not reveal any LV systolic dysfunction. No plans to proceed with cardiac ischemic workup at this time.  2. Palpitations: No further episodes of palpitations with metoprolol 25 mg daily.  3. Hypertension: I spironolactone 100 mg daily. Blood pressure is well controlled currently.  4. Morbid obesity: The patient is dieting and trying to exercise. She will  follow-up with her primary care physician for ongoing recommendations, possible referral to bariatric clinic. She states she is unable to tolerate phentermine.   Current medicines are reviewed at length with the patient today.    Labs/ tests ordered today include:   Orders Placed This Encounter  Procedures  . EKG 12-Lead     Disposition:   FU with When necessary.  Signed, Jory Sims, NP  02/13/2017 3:49 PM    [facility] [address] [place], [place] [address] Phone: [phone]; Fax: [phone]

## 2017-02-13 NOTE — Progress Notes (Signed)
Name: Heidi Landry    DOB: 01-04-1986  Age: 31 y.o.  MR#: [id number]       PCP:  Eustaquio Maize, MD      Insurance: Payor: MEDICAID [place] / Plan: MEDICAID [place] ACCESS / Product Type: *No Product type* /   CC:   No chief complaint on file.   VS Vitals:   02/13/17 1503  Pulse: 99  SpO2: 97%  Weight: (!) 351 lb (159.2 kg)  Height: 5' 2"  (1.575 m)    Weights Current Weight  02/13/17 (!) 351 lb (159.2 kg)  01/26/17 (!) 356 lb 12.8 oz (161.8 kg)  01/21/17 (!) 351 lb 6.4 oz (159.4 kg)    Blood Pressure  BP Readings from Last 3 Encounters:  01/26/17 130/80  01/21/17 127/75  12/23/16 (!) 150/98     Admit date:  (Not on file) Last encounter with RMR:  Visit date not found   Allergy Patient has no known allergies.  Current Outpatient Prescriptions  Medication Sig Dispense Refill  . ACCU-CHEK FASTCLIX LANCETS MISC 1 each by Does not apply route daily. Use to check BG daily or every other day 102 each 2  . cetirizine (ZYRTEC) 10 MG tablet Take 10 mg by mouth daily.     . cyclobenzaprine (FLEXERIL) 10 MG tablet Take 1 tablet (10 mg total) by mouth 3 (three) times daily as needed for muscle spasms. (Patient taking differently: Take 10 mg by mouth as needed for muscle spasms. ) 30 tablet 0  . fluticasone (FLONASE) 50 MCG/ACT nasal spray Place 1 spray into both nostrils daily.    Marland Kitchen ibuprofen (ADVIL,MOTRIN) 200 MG tablet Take 1,000 mg by mouth every 6 (six) hours as needed.    . megestrol (MEGACE) 40 MG tablet Take 1 tablet (40 mg total) by mouth daily. 30 tablet 1  . metFORMIN (GLUCOPHAGE-XR) 500 MG 24 hr tablet Take 2 tablets (1,000 mg total) by mouth daily with breakfast. 68 tablet 1  . metoprolol succinate (TOPROL-XL) 25 MG 24 hr tablet Take 1 tablet (25 mg total) by mouth daily. Take with or immediately following a meal. 30 tablet 2  . norethindrone (ORTHO MICRONOR) 0.35 MG tablet Take 1 tablet (0.35 mg total) by mouth daily. 1 Package 11  . omeprazole (PRILOSEC) 20 MG capsule Take  1 capsule (20 mg total) by mouth daily. 1 tablet a day 30 capsule 6  . Prenatal Vit-Fe Fumarate-FA (PRENATAL VITAMIN PLUS LOW IRON) 27-1 MG TABS TAKE ONE TABLET BY MOUTH ONCE DAILY 30 tablet 11  . sertraline (ZOLOFT) 50 MG tablet Take 3 tablets (150 mg total) by mouth daily. Take 1 tablet daily 90 tablet 4  . spironolactone (ALDACTONE) 100 MG tablet Take 1 tablet (100 mg total) by mouth daily. 90 tablet 1   No current facility-administered medications for this visit.     Discontinued Meds:    Medications Discontinued During This Encounter  Medication Reason  . Ascorbic Acid (VITAMIN C) 1000 MG tablet Error  . glucose blood (ACCU-CHEK AVIVA) test strip Error    Patient Active Problem List   Diagnosis Date Noted  . Varicose veins of left lower extremity with complications 70/48/8891  . Pre-diabetes 11/04/2016  . Retinopathy of both eyes 11/04/2016  . IBS (irritable bowel syndrome) 10/09/2016  . Generalized anxiety disorder 07/14/2016  . BMI 60.0-69.9, adult ([place]) 03/12/2016  . Esophageal reflux 03/12/2016  . Essential hypertension 03/12/2016  . Post partum depression 03/12/2016  . Placental abruption in third trimester 12/07/2015  . Gestational  diabetes mellitus, class A2/B 08/08/2015  . Rubella non-immune status, antepartum 07/18/2015  . Supervision of other high-risk pregnancy 07/17/2015  . Chronic hypertension during pregnancy, antepartum 07/17/2015  . Smoker 07/17/2015  . Thoracic spine fracture ([facility]) 04/13/2012    LABS    Component Value Date/Time   NA 142 03/12/2016 1440   NA 135 12/06/2015 1816   NA 138 02/26/2015 1350   NA 143 10/16/2014 1137   K 4.1 03/12/2016 1440   K 3.8 12/06/2015 1816   K 4.6 02/26/2015 1350   CL 99 03/12/2016 1440   CL 100 (L) 12/06/2015 1816   CL 103 02/26/2015 1350   CO2 27 03/12/2016 1440   CO2 25 12/06/2015 1816   CO2 30 02/26/2015 1350   GLUCOSE 67 03/12/2016 1440   GLUCOSE 98 12/06/2015 1816   GLUCOSE 96 02/26/2015 1350    GLUCOSE 99 10/16/2014 1137   BUN 10 03/12/2016 1440   BUN 5 (L) 12/06/2015 1816   BUN 9 02/26/2015 1350   BUN 14 10/16/2014 1137   CREATININE 0.53 (L) 03/12/2016 1440   CREATININE 0.45 12/06/2015 1816   CREATININE 0.68 02/26/2015 1350   CALCIUM 8.7 03/12/2016 1440   CALCIUM 8.1 (L) 12/06/2015 1816   CALCIUM 8.8 02/26/2015 1350   GFRNONAA 128 03/12/2016 1440   GFRNONAA >60 12/06/2015 1816   GFRNONAA >90 02/26/2015 1350   GFRAA 147 03/12/2016 1440   GFRAA >60 12/06/2015 1816   GFRAA >90 02/26/2015 1350   CMP     Component Value Date/Time   NA 142 03/12/2016 1440   K 4.1 03/12/2016 1440   CL 99 03/12/2016 1440   CO2 27 03/12/2016 1440   GLUCOSE 67 03/12/2016 1440   GLUCOSE 98 12/06/2015 1816   BUN 10 03/12/2016 1440   CREATININE 0.53 (L) 03/12/2016 1440   CALCIUM 8.7 03/12/2016 1440   PROT 6.9 03/12/2016 1440   ALBUMIN 4.3 03/12/2016 1440   AST 16 03/12/2016 1440   ALT 29 03/12/2016 1440   ALKPHOS 94 03/12/2016 1440   BILITOT 0.4 03/12/2016 1440   GFRNONAA 128 03/12/2016 1440   GFRAA 147 03/12/2016 1440       Component Value Date/Time   WBC 9.5 05/30/2016 1115   WBC 11.9 (H) 04/18/2016 1248   WBC 14.8 (H) 12/09/2015 0550   WBC 17.8 (H) 12/08/2015 0525   WBC 18.0 (H) 12/07/2015 0525   HGB 11.8 (A) 01/17/2016 1100   HGB 10.4 (L) 12/09/2015 0550   HGB 11.8 (L) 12/08/2015 0525   HGB 11.7 (L) 12/07/2015 0525   HCT 37.0 05/30/2016 1115   HCT 37.3 04/18/2016 1248   HCT 32.6 (L) 12/09/2015 0550   HCT 36.9 12/08/2015 0525   HCT 36.5 12/07/2015 0525   HCT 41.3 07/17/2015 1153   MCV 81 05/30/2016 1115   MCV 79 04/18/2016 1248   MCV 87.4 12/09/2015 0550   MCV 87.4 12/08/2015 0525   MCV 86.5 12/07/2015 0525   MCV 84 07/17/2015 1153    Lipid Panel     Component Value Date/Time   CHOL 154 03/12/2016 1440   TRIG 166 (H) 03/12/2016 1440   HDL 39 (L) 03/12/2016 1440   CHOLHDL 3.9 03/12/2016 1440   LDLCALC 82 03/12/2016 1440    ABG No results found for: PHART,  PCO2ART, PO2ART, HCO3, TCO2, ACIDBASEDEF, O2SAT   Lab Results  Component Value Date   TSH 1.290 04/18/2016   BNP (last 3 results) No results for input(s): BNP in the last 8760 hours.  ProBNP (  last 3 results) No results for input(s): PROBNP in the last 8760 hours.  Cardiac Panel (last 3 results) No results for input(s): CKTOTAL, CKMB, TROPONINI, RELINDX in the last 72 hours.  Iron/TIBC/Ferritin/ %Sat No results found for: IRON, TIBC, FERRITIN, IRONPCTSAT   EKG Orders placed or performed during the hospital encounter of 01/25/16  . EKG 12-Lead  . EKG 12-Lead  . EKG 12-Lead  . EKG 12-Lead     Prior Assessment and Plan Problem List as of 02/13/2017 Reviewed: 01/26/2017  9:51 AM by Florian Buff, MD     Cardiovascular and Mediastinum   Chronic hypertension during pregnancy, antepartum   Essential hypertension   Varicose veins of left lower extremity with complications     Digestive   Esophageal reflux   IBS (irritable bowel syndrome)     Endocrine   Gestational diabetes mellitus, class A2/B     Musculoskeletal and Integument   Thoracic spine fracture ([place])     Other   Supervision of other high-risk pregnancy   Smoker   Rubella non-immune status, antepartum   Placental abruption in third trimester   BMI 60.0-69.9, adult ([place])   Post partum depression   Generalized anxiety disorder   Pre-diabetes   Retinopathy of both eyes       Imaging: No results found.

## 2017-02-16 ENCOUNTER — Telehealth: Payer: Self-pay | Admitting: Adult Health

## 2017-02-16 NOTE — Telephone Encounter (Signed)
Pt is needing a referral sent to her [facility] here in [place] letting them know that she's needing to have work done on her neck to help strengthen the muscles in her neck and chest. It can be faxed to [phone]

## 2017-02-16 NOTE — Telephone Encounter (Signed)
Faxed notes from [place] to chiropractor.

## 2017-02-17 ENCOUNTER — Other Ambulatory Visit: Payer: Self-pay | Admitting: Obstetrics & Gynecology

## 2017-02-23 ENCOUNTER — Encounter: Payer: Self-pay | Admitting: Obstetrics & Gynecology

## 2017-02-23 ENCOUNTER — Ambulatory Visit (INDEPENDENT_AMBULATORY_CARE_PROVIDER_SITE_OTHER): Payer: Medicaid Other

## 2017-02-23 ENCOUNTER — Ambulatory Visit (INDEPENDENT_AMBULATORY_CARE_PROVIDER_SITE_OTHER): Payer: Medicaid Other | Admitting: Obstetrics & Gynecology

## 2017-02-23 VITALS — BP 130/82 | HR 72 | Ht 62.0 in | Wt 353.6 lb

## 2017-02-23 DIAGNOSIS — N83202 Unspecified ovarian cyst, left side: Secondary | ICD-10-CM

## 2017-02-23 DIAGNOSIS — Z6841 Body Mass Index (BMI) 40.0 and over, adult: Secondary | ICD-10-CM | POA: Diagnosis not present

## 2017-02-23 DIAGNOSIS — N83201 Unspecified ovarian cyst, right side: Secondary | ICD-10-CM

## 2017-02-23 DIAGNOSIS — N854 Malposition of uterus: Secondary | ICD-10-CM

## 2017-02-23 NOTE — Progress Notes (Addendum)
PELVIC US TA/TV: homogeneous anteverted uterus,wnl,normal ovaries bilat (only seen on T/A because of pt body habitus),EEC 9 mm,no free fluid

## 2017-02-23 NOTE — Progress Notes (Signed)
Follow up appointment for results  Chief Complaint  Patient presents with  . Follow-up    ultrasound    Blood pressure 130/82, pulse 72, height 5' 2"  (1.575 m), weight (!) 353 lb 9.6 oz (160.4 kg), last menstrual period 01/07/2017, not currently breastfeeding.  US Transvaginal Non-ob  Result Date: 02/23/2017 GYNECOLOGIC SONOGRAM Heidi Landry is a 31 y.o. G1P0101 LMP 01/07/2017 for a pelvic sonogram for bilat pelvic pain. Uterus                      9 x 4.7 x 3.8 cm, homogeneous anteverted uterus,wnl Endometrium          9 mm, symmetrical, wnl Right ovary             2.2 x 2.3 x 1.7 cm, wnl (unable to see w/TV) Left ovary                2.3 x 2.9 x 2.3 cm, wnl (unable to see w/TV) No free fluid Technician Comments: PELVIC US TA/TV: homogeneous anteverted uterus,wnl,normal ovaries bilat (only seen w T/A because of pt body habitus),EEC 9 mm,no free fluid Amber Heide Guile 02/23/2017 10:57 AM Clinical Impression and recommendations: I have reviewed the sonogram results above, combined with the patient's current clinical course, below are my impressions and any appropriate recommendations for management based on the sonographic findings. Uterus and endometrium are normal without pathology Both ovaries are normal size and without abnormality No gyn anatomical source is noted for the patient's symptoms EURE,LUTHER H 02/23/2017 11:24 AM   US Pelvis Complete  Result Date: 02/23/2017 GYNECOLOGIC SONOGRAM Heidi Landry is a 31 y.o. G1P0101 LMP 01/07/2017 for a pelvic sonogram for bilat pelvic pain. Uterus                      9 x 4.7 x 3.8 cm, homogeneous anteverted uterus,wnl Endometrium          9 mm, symmetrical, wnl Right ovary             2.2 x 2.3 x 1.7 cm, wnl (unable to see w/TV) Left ovary                2.3 x 2.9 x 2.3 cm, wnl (unable to see w/TV) No free fluid Technician Comments: PELVIC US TA/TV: homogeneous anteverted uterus,wnl,normal ovaries bilat (only seen w T/A because of pt body habitus),EEC 9  mm,no free fluid Amber Heide Guile 02/23/2017 10:57 AM Clinical Impression and recommendations: I have reviewed the sonogram results above, combined with the patient's current clinical course, below are my impressions and any appropriate recommendations for management based on the sonographic findings. Uterus and endometrium are normal without pathology Both ovaries are normal size and without abnormality No gyn anatomical source is noted for the patient's symptoms EURE,LUTHER H 02/23/2017 11:24 AM      MEDS ordered this encounter: No orders of the defined types were placed in this encounter.   Orders for this encounter: No orders of the defined types were placed in this encounter.   Impression: Cysts of both ovaries - now with resolution after being on the megestrol for suppression    Plan: Resolution of ovarian cysts on megestrol, pt wants to continue for her progesterone only approach to [facility] Recommend 40 ng daily for 21 days, off 7 days then repeat  Follow Up: Return if symptoms worsen or fail to improve.       Face to face time:  10  minutes  Greater than 50% of the visit time was spent in counseling and coordination of care with the patient.  The summary and outline of the counseling and care coordination is summarized in the note above.   All questions were answered.  Past Medical History:  Diagnosis Date  . Anxiety   . Atrial fibrillation ([place])   . Carpal tunnel syndrome, bilateral   . Chest pain   . Chronic back pain   . Degenerative disc disease   . Edema   . Gestational diabetes mellitus, antepartum   . Hypertension   . IBS (irritable bowel syndrome)   . Panic attacks   . Post partum depression   . Sleep apnea   . Spondylosis   . Tendonitis     Past Surgical History:  Procedure Laterality Date  . CESAREAN SECTION N/A 12/08/2015   Procedure: CESAREAN SECTION;  Surgeon: Jonnie Kind, MD;  Location: [facility];  Service: Obstetrics;  Laterality: N/A;  .  CHOLECYSTECTOMY      OB History    Gravida Para Term Preterm AB Living   1 1   1   1    SAB TAB Ectopic Multiple Live Births         0 1      No Known Allergies  Social History   Social History  . Marital status: Single    Spouse name: N/A  . Number of children: N/A  . Years of education: 37   Occupational History  .  [employer]   Social History Main Topics  . Smoking status: Current Every Day Smoker    Packs/day: 1.00    Years: 9.00    Types: Cigarettes    Start date: 05/25/2006  . Smokeless tobacco: Never Used  . Alcohol use No  . Drug use: No  . Sexual activity: Yes    Birth control/ protection: Pill   Other Topics Concern  . None   Social History Narrative  . None    Family History  Problem Relation Age of Onset  . Fibromyalgia Mother   . Cancer Father     lung, colon   . Hernia Father   . Diabetes Maternal Grandmother   . Hypertension Maternal Grandmother   . Cancer Paternal Uncle   . Brain cancer Paternal Uncle   . Throat cancer Paternal Uncle   . Thyroid disease Paternal Aunt

## 2017-02-26 ENCOUNTER — Ambulatory Visit: Payer: Medicaid Other | Admitting: Cardiology

## 2017-03-03 ENCOUNTER — Other Ambulatory Visit: Payer: Self-pay | Admitting: Obstetrics & Gynecology

## 2017-03-03 ENCOUNTER — Telehealth: Payer: Self-pay | Admitting: *Deleted

## 2017-03-03 ENCOUNTER — Encounter: Payer: Self-pay | Admitting: *Deleted

## 2017-03-03 NOTE — Telephone Encounter (Signed)
Tell her to try taking 1/2 tablet and see if that solves the problem  Let me know

## 2017-03-04 ENCOUNTER — Other Ambulatory Visit: Payer: Self-pay | Admitting: Obstetrics & Gynecology

## 2017-03-04 ENCOUNTER — Telehealth: Payer: Self-pay | Admitting: *Deleted

## 2017-03-04 ENCOUNTER — Encounter: Payer: Self-pay | Admitting: *Deleted

## 2017-03-04 MED ORDER — NORETHINDRONE 0.35 MG PO TABS: 1.0000 | ORAL_TABLET | Freq: Every day | ORAL | 11 refills | Status: DC

## 2017-03-04 NOTE — Telephone Encounter (Signed)
Patient called stating she would rather just get back on her birth control pills. She also wants to know if there is a higher dosage. Please advise. Mychart active.

## 2017-03-11 ENCOUNTER — Other Ambulatory Visit: Payer: Self-pay | Admitting: Pharmacist

## 2017-03-22 ENCOUNTER — Other Ambulatory Visit: Payer: Self-pay | Admitting: Pharmacist

## 2017-03-22 DIAGNOSIS — I10 Essential (primary) hypertension: Secondary | ICD-10-CM

## 2017-03-23 ENCOUNTER — Other Ambulatory Visit: Payer: Self-pay | Admitting: *Deleted

## 2017-03-23 DIAGNOSIS — I10 Essential (primary) hypertension: Secondary | ICD-10-CM

## 2017-03-23 MED ORDER — METOPROLOL SUCCINATE ER 25 MG PO TB24: 25.0000 mg | ORAL_TABLET | Freq: Every day | ORAL | 0 refills | Status: DC

## 2017-04-14 ENCOUNTER — Other Ambulatory Visit: Payer: Self-pay | Admitting: Pediatrics

## 2017-04-14 DIAGNOSIS — I10 Essential (primary) hypertension: Secondary | ICD-10-CM

## 2017-04-22 ENCOUNTER — Encounter: Payer: Self-pay | Admitting: Pediatrics

## 2017-04-22 ENCOUNTER — Ambulatory Visit (INDEPENDENT_AMBULATORY_CARE_PROVIDER_SITE_OTHER): Payer: Medicaid Other | Admitting: Pediatrics

## 2017-04-22 ENCOUNTER — Other Ambulatory Visit: Payer: Self-pay | Admitting: Pediatrics

## 2017-04-22 ENCOUNTER — Telehealth: Payer: Self-pay | Admitting: Pediatrics

## 2017-04-22 VITALS — BP 148/87 | HR 102 | Temp 98.5°F | Ht 62.0 in | Wt 358.4 lb

## 2017-04-22 DIAGNOSIS — J3089 Other allergic rhinitis: Secondary | ICD-10-CM | POA: Diagnosis not present

## 2017-04-22 DIAGNOSIS — F439 Reaction to severe stress, unspecified: Secondary | ICD-10-CM

## 2017-04-22 DIAGNOSIS — I10 Essential (primary) hypertension: Secondary | ICD-10-CM

## 2017-04-22 DIAGNOSIS — F411 Generalized anxiety disorder: Secondary | ICD-10-CM | POA: Diagnosis not present

## 2017-04-22 DIAGNOSIS — R7303 Prediabetes: Secondary | ICD-10-CM | POA: Diagnosis not present

## 2017-04-22 LAB — BAYER DCA HB A1C WAIVED: HB A1C: 6 % (ref <7.0)

## 2017-04-22 NOTE — Progress Notes (Signed)
Subjective:   Patient ID: Heidi Landry, female    DOB: [birth year], 31 y.o.   MRN: [id number] CC: Follow-up (3 month) elevated BMI, med problems HPI: Heidi Landry is a 31 y.o. female presenting for Follow-up (3 month)  Had been on diet with no carbs and no sugar Was able to follow it until financially needed to stop and eat whatever she can afford Now getting chicken, beef, sausage, oodles and noodles, pork chops Cooking now 1-2 times a night, meat or crock pot Has tried frozen vegetables some, husband doesn't like vegetables much so has not been eating as much  Stopped pepsi for a while, now back to drinking daily, still doing 1-2 Tablespoons of sugar with sugar creamer with coffee in the morning  Says she was walking better, allergies have been worse past month so hasnt been walking much  Anxiety: feels overwhelmed by toddler son at times, loves him, feels able to take care of him, sometimes long days especially with allergy season she feels she needs to stay inside more She takes breaks when feeling overwhelmed, Husband supportive, he helps give her breaks when he is home from work Mood has been ok Not sleeping well because of son waking up often She has carpal tunnel syndrome affecting both hands, hoping to get surgery soon, mom going to come down to help  Relevant past medical, surgical, family and social history reviewed. Allergies and medications reviewed and updated. History  Smoking Status  . Current Every Day Smoker  . Packs/day: 1.00  . Years: 9.00  . Types: Cigarettes  . Start date: 05/25/2006  Smokeless Tobacco  . Never Used   ROS: Per HPI   Objective:    BP (!) 148/87   Pulse (!) 102   Temp 98.5 F (36.9 C) (Oral)   Ht 5' 2"  (1.575 m)   Wt (!) 358 lb 6.4 oz (162.6 kg)   BMI 65.55 kg/m   Wt Readings from Last 3 Encounters:  04/22/17 (!) 358 lb 6.4 oz (162.6 kg)  02/23/17 (!) 353 lb 9.6 oz (160.4 kg)  02/13/17 (!) 351 lb (159.2 kg)    Gen: NAD, alert,  cooperative with exam, NCAT EYES: EOMI, no conjunctival injection, or no icterus CV: NRRR, normal S1/S2, no murmur, distal pulses 2+ b/l Resp: CTABL, no wheezes, normal WOB Abd: +BS, soft, NTND.  Ext: No edema, warm Neuro: Alert and oriented, strength equal b/l UE and LE, coordination grossly normal MSK: normal muscle bulk Psych: normal affect, denies thoughts of self harm  Assessment & Plan:  Heidi Landry was seen today for follow-up multiple med problems.  Diagnoses and all orders for this visit:  Pre-diabetes Stable, A1c 6.0 Cont to discuss decreasing sugar intake -     CMP14+EGFR -     Bayer DCA Hb A1c Waived  Essential hypertension Elevated today, says she is stressed, has cuff at home, check over next 4 weeks, bring numbers back to clinic, rtc 4 weeks. If regularly elevated let me know -     CMP14+EGFR  Generalized anxiety disorder Stress at home Feels safe at home, feels able to take care of son Appropriate interactions with son in clinic today Open to resources in area for parenting tips [facility] talk with [facility]ian about as well Cont sertraline, discussed return precautions  Allergic rhinitis due to other allergic trigger, unspecified seasonality Cont antihistamine, flonase  Follow up plan: Return in about 4 weeks (around 05/20/2017). CAssunta Found MD WTristan SchroederRGarland  Family Medicine

## 2017-04-23 DIAGNOSIS — J309 Allergic rhinitis, unspecified: Secondary | ICD-10-CM | POA: Insufficient documentation

## 2017-04-23 LAB — CMP14+EGFR
ALBUMIN: 4.3 g/dL (ref 3.5–5.5)
ALK PHOS: 99 IU/L (ref 39–117)
ALT: 48 IU/L — ABNORMAL HIGH (ref 0–32)
AST: 38 IU/L (ref 0–40)
Albumin/Globulin Ratio: 1.5 (ref 1.2–2.2)
BUN/Creatinine Ratio: 13 (ref 9–23)
BUN: 9 mg/dL (ref 6–20)
Bilirubin Total: 0.6 mg/dL (ref 0.0–1.2)
CALCIUM: 8.9 mg/dL (ref 8.7–10.2)
CO2: 24 mmol/L (ref 18–29)
CREATININE: 0.72 mg/dL (ref 0.57–1.00)
Chloride: 99 mmol/L (ref 96–106)
GFR calc Af Amer: 129 mL/min/{1.73_m2} (ref >59)
GFR calc non Af Amer: 112 mL/min/{1.73_m2} (ref >59)
GLUCOSE: 117 mg/dL — AB (ref 65–99)
Globulin, Total: 2.8 g/dL (ref 1.5–4.5)
Potassium: 4.4 mmol/L (ref 3.5–5.2)
Sodium: 143 mmol/L (ref 134–144)
Total Protein: 7.1 g/dL (ref 6.0–8.5)

## 2017-04-24 NOTE — Telephone Encounter (Signed)
Called and talked with pt, referral to parents as teachers discussed, I called program coordinator will contact [facility].

## 2017-05-19 ENCOUNTER — Telehealth: Payer: Self-pay | Admitting: Pediatrics

## 2017-05-19 DIAGNOSIS — I10 Essential (primary) hypertension: Secondary | ICD-10-CM

## 2017-05-19 NOTE — Telephone Encounter (Signed)
FYI, patient reports that her BP has still been elevated with her BP medications.  Running somewhere around 160-170/80's.  She was supposed to followup in 4 weeks (about now) but states she cannot come in right now because of car troubles and has no one to bring her.  She rescheduled to the first week of June.  She wanted to let you know.

## 2017-05-20 ENCOUNTER — Ambulatory Visit: Payer: Medicaid Other | Admitting: Pediatrics

## 2017-05-20 MED ORDER — HYDROCHLOROTHIAZIDE 12.5 MG PO TABS: 12.5000 mg | ORAL_TABLET | Freq: Every day | ORAL | 3 refills | Status: DC

## 2017-05-20 NOTE — Telephone Encounter (Signed)
Pt aware of recommendation She will start HCTZ She stopped the Megace back in March d/t reading side effects Went back on Norethindrone.

## 2017-05-20 NOTE — Telephone Encounter (Signed)
Tried to reach pt, no answer. Stop megace. Start hydrochlorothiazide 12.66m daily. Cont to check at home.

## 2017-06-01 ENCOUNTER — Encounter: Payer: Self-pay | Admitting: Physical Therapy

## 2017-06-01 ENCOUNTER — Ambulatory Visit: Payer: Medicaid Other | Attending: Physical Medicine and Rehabilitation | Admitting: Physical Therapy

## 2017-06-01 DIAGNOSIS — M542 Cervicalgia: Secondary | ICD-10-CM | POA: Diagnosis not present

## 2017-06-01 NOTE — Patient Instructions (Signed)
  Flexibility: Upper Trapezius Stretch   Gently grasp right side of head while reaching behind back with other hand. Tilt head away until a gentle stretch is felt. Hold 30 seconds. Repeat 3 times per set. Do 2 sessions per day.  http://orth.exer.us/340   Levator Stretch   Grasp seat or sit on hand on side to be stretched. Turn head toward other side and look down. Use hand on head to gently stretch neck in that position. Hold _30___ seconds. Repeat on other side. Repeat 3 times. Do 2 sessions per day.  http://[email or web address].us/30   Scapular Retraction (Standing)   With arms at sides, pinch shoulder blades together. Repeat 10 times per set. Do 1-3 sets per session. Do 2 sessions per day.  http://orth.exer.us/944     Flexibility: Neck Retraction   Pull head straight back, keeping eyes and jaw level. Hold 3-5 seconds. Repeat _10 times per set. Do 3-5  sessions per day.  http://orth.exer.us/344   Posture - Sitting   Sit upright, head facing forward. Try using a roll to support lower back. Keep shoulders relaxed, and avoid rounded back. Keep hips level with knees. Avoid crossing legs for long periods.   Flexibility: Corner Stretch   Standing in corner or a doorway with hands just above shoulder level.  Lean forward until a comfortable stretch is felt across chest. Hold __30__ seconds. Repeat __3__ times per set.  Do _2___ sessions per day.  http://orth.exer.us/342   Copyright  VHI. All rights reserved.   [name], PT 06/01/17 [facility] [address] [place], [place], [address] Phone: [phone]   Fax:  [phone]

## 2017-06-01 NOTE — Therapy (Addendum)
[facility] [place], [place], [address] Phone: [phone]   Fax:  [phone]  Physical Therapy Evaluation  Patient Details  Name: Heidi Landry MRN: [id number] Date of Birth: [birth year] Referring Provider: Suella Broad, MD  Encounter Date: 06/01/2017      PT End of Session - 06/01/17 1037    Visit Number 1   Number of Visits 1   Date for PT Re-Evaluation 06/01/17   PT Start Time 6962   PT Stop Time 1103   PT Time Calculation (min) 26 min   Activity Tolerance Patient tolerated treatment well   Behavior During Therapy [facility] for tasks assessed/performed      Past Medical History:  Diagnosis Date  . Anxiety   . Atrial fibrillation ([place])   . Carpal tunnel syndrome, bilateral   . Chest pain   . Chronic back pain   . Degenerative disc disease   . Edema   . Gestational diabetes mellitus, antepartum   . Hypertension   . IBS (irritable bowel syndrome)   . Panic attacks   . Post partum depression   . Sleep apnea   . Spondylosis   . Tendonitis     Past Surgical History:  Procedure Laterality Date  . CESAREAN SECTION N/A 12/08/2015   Procedure: CESAREAN SECTION;  Surgeon: Jonnie Kind, MD;  Location: [facility];  Service: Obstetrics;  Laterality: N/A;  . CHOLECYSTECTOMY      There were no vitals filed for this visit.       Subjective Assessment - 06/01/17 1037    Subjective Patient reports insidious onset of neck pain referring into RUE. Patient sees a Restaurant manager, fast food. Pain is now into her anterior neck.    Pertinent History HTN, carpal tunnel, Degenerative disease, sciatica   Diagnostic tests xray- show arthritis   Patient Stated Goals to get therapy so she can get an MRI   Currently in Pain? Yes   Pain Score 6    Pain Location Neck   Pain Orientation Left   Pain Descriptors / Indicators Sharp   Pain Type Acute pain   Pain Radiating Towards into RUE   Pain Onset More than a month ago   Pain Frequency  Constant   Aggravating Factors  turning to the left   Pain Relieving Factors nothing   Effect of Pain on Daily Activities limite            [facility] PT Assessment - 06/01/17 0001      Assessment   Medical Diagnosis cervical radiculopathy; other disorders C6-C7   Referring Provider Suella Broad, MD   Onset Date/Surgical Date 01/29/17   Hand Dominance Right   Next MD Visit July 2018     Precautions   Precautions None     Balance Screen   Has the patient fallen in the past 6 months No   Has the patient had a decrease in activity level because of a fear of falling?  No   Is the patient reluctant to leave their home because of a fear of falling?  No     Prior Function   Level of Independence Independent     Posture/Postural Control   Posture/Postural Control Postural limitations   Postural Limitations Rounded Shoulders;Forward head;Weight shift right   Posture Comments left shoulder depressed, increased kyphosis at CT junction, tight R QL     ROM / Strength   AROM / PROM / Strength AROM     AROM   Overall AROM Comments  Cervical flex/ext WNL, Rotation and lateral bending decreased 50% bilaterally likely due to forward head positioning.            Objective measurements completed on examination: See above findings.                  PT Education - 06/01/17 1109    Education provided Yes   Education Details HEP   Person(s) Educated Patient   Methods Explanation;Demonstration;Handout   Comprehension Verbalized understanding;Returned demonstration          PT Short Term Goals - 06/01/17 1218      PT SHORT TERM GOAL #1   Title I with HEP   Time 1   Period Days   Status Achieved                   Plan - 06/01/17 1110    Clinical Impression Statement Patient presents today with c/o neck and back pain. She has a long history of back pain, but neck pain has been in past 4-5 months. She is morbidly obese with signiificant postural deficits  likely contributing to neck and back pain. She would benefit from skilled PT to improve ROM, posture and decrease pain, but is limited to an evaluation only due to Medicaid restrictions. Basic postural  and flexibility exercises were issued. A more thorough exam was limited due to patient's toddler being present and very distracting.   History and Personal Factors relevant to plan of care: CTS, degenerative spine, obesity   Clinical Presentation Evolving   Clinical Decision Making Low   Rehab Potential Good   PT Frequency One time visit   PT Treatment/Interventions ADLs/Self Care Home Management;Therapeutic exercise   PT Next Visit Plan one time visit   PT Home Exercise Plan cervical tension stretches, cervical and scapular retraction, pec stretch, postural ed.   Consulted and Agree with Plan of Care Patient      Patient will benefit from skilled therapeutic intervention in order to improve the following deficits and impairments:  Postural dysfunction, Impaired flexibility, Decreased range of motion, Pain  Visit Diagnosis: Cervicalgia - Plan: PT plan of care cert/re-cert     Problem List Patient Active Problem List   Diagnosis Date Noted  . Allergic rhinitis 04/23/2017  . Varicose veins of left lower extremity with complications 09/98/3382  . Pre-diabetes 11/04/2016  . Retinopathy of both eyes 11/04/2016  . IBS (irritable bowel syndrome) 10/09/2016  . Generalized anxiety disorder 07/14/2016  . BMI 60.0-69.9, adult ([place]) 03/12/2016  . Esophageal reflux 03/12/2016  . Essential hypertension 03/12/2016  . Post partum depression 03/12/2016  . Placental abruption in third trimester 12/07/2015  . Gestational diabetes mellitus, class A2/B 08/08/2015  . Rubella non-immune status, antepartum 07/18/2015  . Supervision of other high-risk pregnancy 07/17/2015  . Chronic hypertension during pregnancy, antepartum 07/17/2015  . Smoker 07/17/2015  . Thoracic spine fracture ([place]) 04/13/2012     [name] PT 06/01/2017, 12:25 PM  [facility] [place], [place], [address] Phone: [phone]   Fax:  [phone]  Name: Heidi Landry MRN: [id number] Date of Birth: 08-Aug-1986  PHYSICAL THERAPY DISCHARGE SUMMARY  Visits from Start of Care: 1  Current functional level related to goals / functional outcomes: SEE ABOVE   Remaining deficits: SEE ABOVE   Education / Equipment: HEP Plan: Patient agrees to discharge.  Patient goals were met. Patient is being discharged due to meeting the stated rehab goals.  ?????    Almyra Free  Heidi Landry, PT 09/23/18 9:42 AM [facility] [address] [place], [place], [address] Phone: [phone]   Fax:  [phone]

## 2017-06-03 ENCOUNTER — Telehealth: Payer: Self-pay | Admitting: Pediatrics

## 2017-06-03 ENCOUNTER — Other Ambulatory Visit: Payer: Self-pay | Admitting: *Deleted

## 2017-06-03 ENCOUNTER — Ambulatory Visit (INDEPENDENT_AMBULATORY_CARE_PROVIDER_SITE_OTHER): Payer: Medicaid Other | Admitting: Pediatrics

## 2017-06-03 ENCOUNTER — Encounter: Payer: Self-pay | Admitting: Pediatrics

## 2017-06-03 VITALS — BP 139/89 | HR 100 | Temp 98.0°F | Ht 62.0 in | Wt 349.0 lb

## 2017-06-03 DIAGNOSIS — R7303 Prediabetes: Secondary | ICD-10-CM | POA: Diagnosis not present

## 2017-06-03 DIAGNOSIS — I10 Essential (primary) hypertension: Secondary | ICD-10-CM | POA: Diagnosis not present

## 2017-06-03 DIAGNOSIS — R399 Unspecified symptoms and signs involving the genitourinary system: Secondary | ICD-10-CM

## 2017-06-03 DIAGNOSIS — F411 Generalized anxiety disorder: Secondary | ICD-10-CM

## 2017-06-03 DIAGNOSIS — Z6841 Body Mass Index (BMI) 40.0 and over, adult: Secondary | ICD-10-CM | POA: Diagnosis not present

## 2017-06-03 DIAGNOSIS — N3090 Cystitis, unspecified without hematuria: Secondary | ICD-10-CM | POA: Diagnosis not present

## 2017-06-03 LAB — MICROSCOPIC EXAMINATION
RBC MICROSCOPIC, UA: NONE SEEN /HPF (ref 0–?)
Renal Epithel, UA: NONE SEEN /hpf

## 2017-06-03 LAB — URINALYSIS, COMPLETE
BILIRUBIN UA: NEGATIVE
GLUCOSE, UA: NEGATIVE
LEUKOCYTES UA: NEGATIVE
Nitrite, UA: NEGATIVE
RBC UA: NEGATIVE
Specific Gravity, UA: 1.025 (ref 1.005–1.030)
Urobilinogen, Ur: 0.2 mg/dL (ref 0.2–1.0)
pH, UA: 6 (ref 5.0–7.5)

## 2017-06-03 MED ORDER — METOPROLOL SUCCINATE ER 25 MG PO TB24: ORAL_TABLET | ORAL | 5 refills | Status: DC

## 2017-06-03 MED ORDER — NITROFURANTOIN MONOHYD MACRO 100 MG PO CAPS: 100.0000 mg | ORAL_CAPSULE | Freq: Two times a day (BID) | ORAL | 0 refills | Status: DC

## 2017-06-03 NOTE — Progress Notes (Signed)
  Subjective:   Patient ID: Heidi Landry, female    DOB: 06-05-1986, 31 y.o.   MRN: [id number] CC: Follow-up (anxiety, htn)  HPI: Heidi Landry is a 31 y.o. female presenting for Follow-up (anxiety, htn)  Here today with son austin  Parenting partner coming to her house, has been pleased with the support she has been getting Toddler starting to learn more words, communication getting better  Went to PT for neck pain Was seen by Dr. Nelva Bush, getting MRI  Prediabetes: not doing anything to lose weight now she says Drinking sodas daily but thinks she is drinking water more Not drinking coffee regularly anymore  Depression and anxiety: started again after birth of son in 2016 Mood has been ok, doesn't feel depressed Still feels anxious sometimes  Relevant past medical, surgical, family and social history reviewed. Allergies and medications reviewed and updated. History  Smoking Status  . Current Every Day Smoker  . Packs/day: 1.00  . Years: 9.00  . Types: Cigarettes  . Start date: 05/25/2006  Smokeless Tobacco  . Never Used   ROS: Per HPI   Objective:    BP 139/89   Pulse 100   Temp 98 F (36.7 C) (Oral)   Ht 5' 2"  (1.575 m)   Wt (!) 349 lb (158.3 kg)   BMI 63.83 kg/m   Wt Readings from Last 3 Encounters:  06/03/17 (!) 349 lb (158.3 kg)  04/22/17 (!) 358 lb 6.4 oz (162.6 kg)  02/23/17 (!) 353 lb 9.6 oz (160.4 kg)    Gen: NAD, alert, cooperative with exam, NCAT EYES: EOMI, no conjunctival injection, or no icterus ENT:  OP without erythema LYMPH: no cervical LAD CV: NRRR, normal S1/S2, no murmur Resp: CTABL, no wheezes, normal WOB Abd: +BS, soft, mildly tender with deep palpation over pannus, obese, no redness. Ext: No edema, warm Neuro: Alert and oriented  Assessment & Plan:  Zniyah was seen today for follow-up multiple med problems.  Diagnoses and all orders for this visit:  Cystitis Treat with nitrofurantoin BID x 5 days, f/u urine culture  Essential  hypertension Cont HCTZ Has not been taking below, restart -     metoprolol succinate (TOPROL-XL) 25 MG 24 hr tablet; TAKE 1 TABLET BY MOUTH ONCE DAILY WITH OR IMMEDIATELY FOLLOWING A MEAL  UTI symptoms -     Urinalysis, Complete -     Urine culture  Pre-diabetes Has stopped coffee, encourage soda cessation  Generalized anxiety disorder Stable, improved per pt Cont sertraline  BMI 60.0-69.9, adult (HCC) Pt thinking about gastric bypass surgery, discussed some, gave some information, does have comorbidities including pre-diabetes, HTN Will refer for further evaluation when pt ready   Follow up plan: Return in about 2 months (around 08/03/2017). Assunta Found, MD [place]

## 2017-06-03 NOTE — Telephone Encounter (Signed)
Macrobid sent to pharmacy per Dr. Evette Doffing for uti.

## 2017-06-03 NOTE — Patient Instructions (Addendum)
Bariatric Surgery Information Bariatric surgery, also called weight loss surgery, is a procedure that helps you lose weight. You may consider or your health care provider may suggest bariatric surgery if:  You are severely obese and have been unable to lose weight through diet and exercise.  You have health problems related to obesity, such as: ? Type 2 diabetes. ? Heart disease. ? Lung disease.  How does bariatric surgery help me lose weight? Bariatric surgery helps you lose weight by decreasing how much food your body absorbs. This is done by closing off part of your stomach to make it smaller. This restricts the amount of food your stomach can hold. Bariatric surgery can also change your body's regular digestive process, so that food bypasses the parts of your body that absorb calories and nutrients. If you decide to have bariatric surgery, it is important to continue to eat a healthy diet and exercise regularly after the surgery. What are the different kinds of bariatric surgery? There are two kinds of bariatric surgeries:  Restrictive surgeries make your stomach smaller. They do not change your digestive process. The smaller the size of your new stomach, the less food you can eat. There are different types of restrictive surgeries.  Malabsorptive surgeries both make your stomach smaller and alter your digestive process so that your body processes less calories and nutrients. These are the most common kind of bariatric surgery. There are different types of malabsorptive surgeries.  What are the different types of restrictive surgery? Adjustable Gastric Banding In this procedure, an inflatable band is placed around your stomach near the upper end. This makes the passageway for food into the rest of your stomach much smaller. The band can be adjusted, making it tighter or looser, by filling it with salt solution. Your surgeon can adjust the band based on how are you feeling and how much  weight you are losing. The band can be removed in the future. Vertical Banded Gastroplasty In this procedure, staples are used to separate your stomach into two parts, a small upper pouch and a bigger lower pouch. This decreases how much food you can eat. Sleeve Gastrectomy In this procedure, your stomach is made smaller. This is done by surgically removing a large part of your stomach. When your stomach is smaller, you feel full more quickly and reduce how much you eat. What are the different types of malabsorptive surgery? Roux-en-Y Gastric Bypass (RGB) This is the most common weight loss surgery. In this procedure, a small stomach pouch is created in the upper part of your stomach. Next, this small stomach pouch is attached directly to the middle part of your small intestine. The farther down your small intestine the new connection is made, the fewer calories and nutrients you will absorb. Biliopancreatic Diversion with Duodenal Switch (BPD/DS) This is a multi-step procedure. In this procedure, a large part of your stomach is removed, making your stomach smaller. Next, this smaller stomach is attached to the lower part of your small intestine. Like the RGB surgery, you absorb fewer calories and nutrients the farther down your small intestine the attachment is made. What are the risks of bariatric surgery? As with any surgical procedure, each type of bariatric surgery has its own risks. These risks also depend on your age, your overall health, and any other medical conditions you may have. When deciding on bariatric surgery, it is very important to:  Talk to your health care provider and choose the surgery that is best for  you.  Ask your health care provider about specific risks for the surgery you choose.  Where to find more information:  American Society for Metabolic & Bariatric Surgery: www.asmbs.org  Weight-control Information Network (WIN): win.AmenCredit.is This information is not  intended to replace advice given to you by your health care provider. Make sure you discuss any questions you have with your health care provider. Document Released: 12/15/2005 Document Revised: 05/22/2016 Document Reviewed: 06/15/2013 Elsevier Interactive Patient Education  2017 [employer].

## 2017-06-03 NOTE — Telephone Encounter (Signed)
marobid sent to pharmacy [place] notified pt

## 2017-06-04 LAB — URINE CULTURE

## 2017-06-10 ENCOUNTER — Encounter: Payer: Self-pay | Admitting: Pediatrics

## 2017-06-23 ENCOUNTER — Other Ambulatory Visit: Payer: Self-pay | Admitting: Pediatrics

## 2017-06-23 ENCOUNTER — Encounter: Payer: Self-pay | Admitting: Pediatrics

## 2017-06-23 DIAGNOSIS — M545 Low back pain, unspecified: Secondary | ICD-10-CM

## 2017-06-23 DIAGNOSIS — I10 Essential (primary) hypertension: Secondary | ICD-10-CM

## 2017-06-23 MED ORDER — METFORMIN HCL ER 500 MG PO TB24: ORAL_TABLET | ORAL | 0 refills | Status: DC

## 2017-06-23 MED ORDER — SPIRONOLACTONE 100 MG PO TABS: 100.0000 mg | ORAL_TABLET | Freq: Every day | ORAL | 0 refills | Status: DC

## 2017-06-24 ENCOUNTER — Encounter: Payer: Self-pay | Admitting: Pediatrics

## 2017-06-24 MED ORDER — CYCLOBENZAPRINE HCL 10 MG PO TABS: 10.0000 mg | ORAL_TABLET | Freq: Three times a day (TID) | ORAL | 0 refills | Status: DC | PRN

## 2017-07-01 ENCOUNTER — Other Ambulatory Visit: Payer: Self-pay | Admitting: Pediatrics

## 2017-07-01 DIAGNOSIS — F411 Generalized anxiety disorder: Secondary | ICD-10-CM

## 2017-07-03 ENCOUNTER — Telehealth: Payer: Self-pay | Admitting: Pediatrics

## 2017-07-03 DIAGNOSIS — F411 Generalized anxiety disorder: Secondary | ICD-10-CM

## 2017-07-06 MED ORDER — SERTRALINE HCL 50 MG PO TABS: 150.0000 mg | ORAL_TABLET | Freq: Every day | ORAL | 1 refills | Status: DC

## 2017-07-06 NOTE — Telephone Encounter (Signed)
Sertraline is on medicaid preferred list, I can send in Rx again

## 2017-07-07 MED ORDER — METOPROLOL TARTRATE 25 MG PO TABS: 25.0000 mg | ORAL_TABLET | Freq: Two times a day (BID) | ORAL | 3 refills | Status: DC

## 2017-07-07 NOTE — Telephone Encounter (Signed)
Please let pt know-- I sent in the short acting metoprolol which is on the [facility] $4 list without insurance. She should take it twice a day instead of once a day. Its called metoprolol tartrate instead of metoprolol succinate. We can switch her back to the once a day in the future if insurance allows.

## 2017-07-07 NOTE — Telephone Encounter (Signed)
Spoke with pt and her Medicaid had been cut off and that is why she was having trouble getting the Zoloft. Pt was able to get it and wanted to let you know she hasn't been able to get her metoprolol for almost a week as she doesn't have the money to get it but will be able to get it in the next day or 2. She won her disability case and will be back on Medicaid again soon and wanted you to be aware.

## 2017-07-07 NOTE — Telephone Encounter (Signed)
Pt aware and will pick up the rx.

## 2017-08-03 ENCOUNTER — Encounter: Payer: Self-pay | Admitting: Obstetrics & Gynecology

## 2017-08-03 ENCOUNTER — Encounter: Payer: Self-pay | Admitting: Pediatrics

## 2017-08-03 ENCOUNTER — Ambulatory Visit (INDEPENDENT_AMBULATORY_CARE_PROVIDER_SITE_OTHER): Payer: Medicaid Other | Admitting: Pediatrics

## 2017-08-03 VITALS — BP 125/75 | HR 98 | Temp 98.9°F | Ht 62.0 in | Wt 352.0 lb

## 2017-08-03 DIAGNOSIS — K219 Gastro-esophageal reflux disease without esophagitis: Secondary | ICD-10-CM

## 2017-08-03 DIAGNOSIS — R7303 Prediabetes: Secondary | ICD-10-CM

## 2017-08-03 DIAGNOSIS — J358 Other chronic diseases of tonsils and adenoids: Secondary | ICD-10-CM

## 2017-08-03 DIAGNOSIS — K589 Irritable bowel syndrome without diarrhea: Secondary | ICD-10-CM | POA: Diagnosis not present

## 2017-08-03 DIAGNOSIS — R220 Localized swelling, mass and lump, head: Secondary | ICD-10-CM | POA: Diagnosis not present

## 2017-08-03 DIAGNOSIS — J3089 Other allergic rhinitis: Secondary | ICD-10-CM | POA: Diagnosis not present

## 2017-08-03 MED ORDER — CETIRIZINE HCL 10 MG PO TABS: 10.0000 mg | ORAL_TABLET | Freq: Every day | ORAL | 3 refills | Status: DC

## 2017-08-03 MED ORDER — OMEPRAZOLE 20 MG PO CPDR: 20.0000 mg | DELAYED_RELEASE_CAPSULE | Freq: Every day | ORAL | 6 refills | Status: DC

## 2017-08-03 MED ORDER — DICYCLOMINE HCL 20 MG PO TABS: 20.0000 mg | ORAL_TABLET | Freq: Four times a day (QID) | ORAL | 1 refills | Status: DC | PRN

## 2017-08-03 MED ORDER — METFORMIN HCL ER 500 MG PO TB24: ORAL_TABLET | ORAL | 0 refills | Status: DC

## 2017-08-03 MED ORDER — ACCU-CHEK FASTCLIX LANCETS MISC: 1.0000 | Freq: Every day | 2 refills | Status: DC

## 2017-08-03 NOTE — Progress Notes (Signed)
  Subjective:   Patient ID: Heidi Landry, female    DOB: 04-23-1986, 31 y.o.   MRN: [id number] CC: Follow-up multiple med problems HPI: Carmella Kees is a 31 y.o. female presenting for Follow-up  Says she has been doing better Had to go a week off of sertraline bc of insurance problem Mood better since getting back on it  Has better energy past couple of weeks Drinking water some, drinking sodas daily  Has low back pain from degenerative back disease Has some neck pain, told she has arthritis by ortho Was going to physical therapy for neck pain, did home exercises for 3 weeks Has f/u with ortho  Here today with son, two children of a friend  Feels a mass "behind her tonsil" R side There when she swallows for past few weeks No fevers  Relevant past medical, surgical, family and social history reviewed. Allergies and medications reviewed and updated. History  Smoking Status  . Current Every Day Smoker  . Packs/day: 1.00  . Years: 9.00  . Types: Cigarettes  . Start date: 05/25/2006  Smokeless Tobacco  . Never Used   ROS: Per HPI   Objective:    BP 125/75   Pulse 98   Temp 98.9 F (37.2 C) (Oral)   Ht 5' 2"  (1.575 m)   Wt (!) 352 lb (159.7 kg)   BMI 64.38 kg/m   Wt Readings from Last 3 Encounters:  08/03/17 (!) 352 lb (159.7 kg)  06/03/17 (!) 349 lb (158.3 kg)  04/22/17 (!) 358 lb 6.4 oz (162.6 kg)    Gen: NAD, alert, obese, cooperative with exam, NCAT EYES: EOMI, no conjunctival injection, or no icterus ENT:  TMs pearly gray b/l, OP without erythema, no mass visible  LYMPH: no cervical LAD CV: NRRR, normal S1/S2, no murmur, distal pulses 2+ b/l Resp: CTABL, no wheezes, normal WOB Abd: +BS, soft, NTND. no guarding or organomegaly Ext: No edema, warm Neuro: Alert and oriented  Assessment & Plan:  Dawnell was seen today for follow-up multiple med problems.  Diagnoses and all orders for this visit:  Pre-diabetes Cont to discuss lifestyle changes, wt  loss -     ACCU-CHEK FASTCLIX LANCETS MISC; 1 each by Does not apply route daily. Use to check BG daily or every other day -     metFORMIN (GLUCOPHAGE-XR) 500 MG 24 hr tablet; TAKE TWO TABLETS BY MOUTH ONCE DAILY WITH BREAKFAST  Gastroesophageal reflux disease, esophagitis presence not specified Stable, cont below -     omeprazole (PRILOSEC) 20 MG capsule; Take 1 capsule (20 mg total) by mouth daily. 1 tablet a day  Allergic rhinitis due to other allergic trigger, unspecified seasonality Stable, cont below -     cetirizine (ZYRTEC) 10 MG tablet; Take 1 tablet (10 mg total) by mouth daily.  Tonsillar mass -     Ambulatory referral to ENT  Irritable bowel syndrome, unspecified type Stable, cont below -     dicyclomine (BENTYL) 20 MG tablet; Take 1 tablet (20 mg total) by mouth 4 (four) times daily as needed for spasms.   Follow up plan: Return in about 3 months (around 11/03/2017). Assunta Found, MD [place]

## 2017-08-04 ENCOUNTER — Other Ambulatory Visit: Payer: Self-pay | Admitting: Obstetrics & Gynecology

## 2017-08-04 ENCOUNTER — Encounter: Payer: Self-pay | Admitting: Obstetrics & Gynecology

## 2017-08-04 MED ORDER — PODOFILOX 0.5 % EX GEL: Freq: Two times a day (BID) | CUTANEOUS | 11 refills | Status: DC

## 2017-08-27 DIAGNOSIS — K219 Gastro-esophageal reflux disease without esophagitis: Secondary | ICD-10-CM | POA: Diagnosis not present

## 2017-08-27 DIAGNOSIS — G4739 Other sleep apnea: Secondary | ICD-10-CM | POA: Diagnosis not present

## 2017-08-27 DIAGNOSIS — Z9989 Dependence on other enabling machines and devices: Secondary | ICD-10-CM | POA: Diagnosis not present

## 2017-08-27 DIAGNOSIS — J343 Hypertrophy of nasal turbinates: Secondary | ICD-10-CM | POA: Diagnosis not present

## 2017-10-01 ENCOUNTER — Other Ambulatory Visit: Payer: Self-pay | Admitting: Obstetrics & Gynecology

## 2017-10-13 ENCOUNTER — Ambulatory Visit (INDEPENDENT_AMBULATORY_CARE_PROVIDER_SITE_OTHER): Payer: Medicaid Other | Admitting: Physician Assistant

## 2017-10-13 ENCOUNTER — Encounter: Payer: Self-pay | Admitting: Physician Assistant

## 2017-10-13 ENCOUNTER — Telehealth: Payer: Self-pay | Admitting: Physician Assistant

## 2017-10-13 ENCOUNTER — Other Ambulatory Visit: Payer: Self-pay | Admitting: Physical Medicine and Rehabilitation

## 2017-10-13 VITALS — BP 154/96 | HR 94 | Temp 98.6°F | Ht 62.0 in | Wt 356.0 lb

## 2017-10-13 DIAGNOSIS — R0600 Dyspnea, unspecified: Secondary | ICD-10-CM

## 2017-10-13 DIAGNOSIS — M50823 Other cervical disc disorders at C6-C7 level: Secondary | ICD-10-CM

## 2017-10-13 DIAGNOSIS — R05 Cough: Secondary | ICD-10-CM | POA: Diagnosis not present

## 2017-10-13 DIAGNOSIS — R059 Cough, unspecified: Secondary | ICD-10-CM

## 2017-10-13 DIAGNOSIS — J40 Bronchitis, not specified as acute or chronic: Secondary | ICD-10-CM | POA: Diagnosis not present

## 2017-10-13 MED ORDER — PREDNISONE 10 MG (21) PO TBPK: ORAL_TABLET | ORAL | 0 refills | Status: DC

## 2017-10-13 MED ORDER — BUDESONIDE-FORMOTEROL FUMARATE 80-4.5 MCG/ACT IN AERO: 2.0000 | INHALATION_SPRAY | Freq: Two times a day (BID) | RESPIRATORY_TRACT | 3 refills | Status: DC

## 2017-10-13 MED ORDER — DOXYCYCLINE HYCLATE 100 MG PO TABS: 100.0000 mg | ORAL_TABLET | Freq: Two times a day (BID) | ORAL | 0 refills | Status: DC

## 2017-10-13 MED ORDER — ALBUTEROL SULFATE HFA 108 (90 BASE) MCG/ACT IN AERS: 2.0000 | INHALATION_SPRAY | Freq: Four times a day (QID) | RESPIRATORY_TRACT | 5 refills | Status: DC | PRN

## 2017-10-13 NOTE — Progress Notes (Signed)
BP (!) 154/96   Pulse 94   Temp 98.6 F (37 C) (Oral)   Ht 5' 2"  (1.575 m)   Wt (!) 356 lb (161.5 kg)   SpO2 97% Comment: sitting  BMI 65.11 kg/m    Subjective:    Patient ID: Heidi Landry, female    DOB: 1986/07/15, 31 y.o.   MRN: [id number]  HPI: [name] is a 31 y.o. female presenting on 10/13/2017 for Cough and Wheezing  This patient is seen today as a work in for her bronchitis. She has a history of recurrent bronchitis. She has never been diagnosed with asthma or COPD. She states that she is a smoker. She really would like to quit but she knows that she can't right now. There is a family history of COPD. She has had as severe amount of cough and tightness in her breathing. She will get short of breath after she walks for very long. She knows that she has not been as active and has gained weight because of the breathing. She has had some fever and chills. She states that she feels hot at all times.  Relevant past medical, surgical, family and social history reviewed and updated as indicated. Allergies and medications reviewed and updated.  Past Medical History:  Diagnosis Date  . Anxiety   . Atrial fibrillation ([place])   . Carpal tunnel syndrome, bilateral   . Chest pain   . Chronic back pain   . Degenerative disc disease   . Edema   . Gestational diabetes mellitus, antepartum   . Hypertension   . IBS (irritable bowel syndrome)   . Panic attacks   . Post partum depression   . Sleep apnea   . Spondylosis   . Tendonitis     Past Surgical History:  Procedure Laterality Date  . CESAREAN SECTION N/A 12/08/2015   Procedure: CESAREAN SECTION;  Surgeon: Jonnie Kind, MD;  Location: [facility];  Service: Obstetrics;  Laterality: N/A;  . CHOLECYSTECTOMY      Review of Systems  Constitutional: Positive for chills and fatigue. Negative for activity change and appetite change.  HENT: Positive for congestion, postnasal drip and sore throat.   Eyes: Negative.     Respiratory: Positive for cough, shortness of breath and wheezing.   Cardiovascular: Negative.  Negative for chest pain, palpitations and leg swelling.  Gastrointestinal: Negative.   Genitourinary: Negative.   Musculoskeletal: Negative.   Skin: Negative.   Neurological: Positive for headaches.    Allergies as of 10/13/2017   No Known Allergies     Medication List       Accurate as of 10/13/17 11:02 AM. Always use your most recent med list.          ACCU-CHEK FASTCLIX LANCETS Misc 1 each by Does not apply route daily. Use to check BG daily or every other day   albuterol 108 (90 Base) MCG/ACT inhaler Commonly known as:  PROVENTIL HFA;VENTOLIN HFA Inhale 2 puffs into the lungs every 6 (six) hours as needed for wheezing or shortness of breath.   budesonide-formoterol 80-4.5 MCG/ACT inhaler Commonly known as:  SYMBICORT Inhale 2 puffs into the lungs 2 (two) times daily.   cetirizine 10 MG tablet Commonly known as:  ZYRTEC Take 1 tablet (10 mg total) by mouth daily.   cyclobenzaprine 10 MG tablet Commonly known as:  FLEXERIL Take 1 tablet (10 mg total) by mouth 3 (three) times daily as needed for muscle spasms.   dicyclomine 20 MG tablet  Commonly known as:  BENTYL Take 1 tablet (20 mg total) by mouth 4 (four) times daily as needed for spasms.   doxycycline 100 MG tablet Commonly known as:  VIBRA-TABS Take 1 tablet (100 mg total) by mouth 2 (two) times daily. 1 po bid   fluticasone 50 MCG/ACT nasal spray Commonly known as:  FLONASE Place 1 spray into both nostrils daily.   hydrochlorothiazide 12.5 MG tablet Commonly known as:  HYDRODIURIL Take 1 tablet (12.5 mg total) by mouth daily.   ibuprofen 200 MG tablet Commonly known as:  ADVIL,MOTRIN Take 1,000 mg by mouth every 6 (six) hours as needed.   metFORMIN 500 MG 24 hr tablet Commonly known as:  GLUCOPHAGE-XR TAKE TWO TABLETS BY MOUTH ONCE DAILY WITH BREAKFAST   metoprolol tartrate 25 MG tablet Commonly  known as:  LOPRESSOR Take 1 tablet (25 mg total) by mouth 2 (two) times daily.   norethindrone 0.35 MG tablet Commonly known as:  ORTHO MICRONOR Take 1 tablet (0.35 mg total) by mouth daily.   omeprazole 20 MG capsule Commonly known as:  PRILOSEC Take 1 capsule (20 mg total) by mouth daily. 1 tablet a day   PNV PRENATAL PLUS MULTIVITAMIN 27-1 MG Tabs TAKE 1 TABLET BY MOUTH ONCE DAILY   podofilox 0.5 % gel Commonly known as:  CONDYLOX Apply topically 2 (two) times daily.   predniSONE 10 MG (21) Tbpk tablet Commonly known as:  STERAPRED UNI-PAK 21 TAB As directed x 6 days   sertraline 50 MG tablet Commonly known as:  ZOLOFT Take 3 tablets (150 mg total) by mouth daily.   spironolactone 100 MG tablet Commonly known as:  ALDACTONE Take 1 tablet (100 mg total) by mouth daily.          Objective:    BP (!) 154/96   Pulse 94   Temp 98.6 F (37 C) (Oral)   Ht 5' 2"  (1.575 m)   Wt (!) 356 lb (161.5 kg)   SpO2 97% Comment: sitting  BMI 65.11 kg/m   No Known Allergies  Physical Exam  Constitutional: She is oriented to person, place, and time. She appears well-developed and well-nourished.  HENT:  Head: Normocephalic and atraumatic.  Right Ear: There is drainage and tenderness.  Left Ear: There is drainage and tenderness.  Nose: Mucosal edema and rhinorrhea present. Right sinus exhibits maxillary sinus tenderness and frontal sinus tenderness. Left sinus exhibits maxillary sinus tenderness and frontal sinus tenderness.  Mouth/Throat: Oropharyngeal exudate and posterior oropharyngeal erythema present.  Eyes: Pupils are equal, round, and reactive to light. Conjunctivae and EOM are normal.  Neck: Normal range of motion. Neck supple.  Cardiovascular: Normal rate, regular rhythm, normal heart sounds and intact distal pulses.   Pulmonary/Chest: Effort normal. She has wheezes in the right upper field, the right middle field, the right lower field, the left upper field, the left  middle field and the left lower field.  Abdominal: Soft. Bowel sounds are normal.  Neurological: She is alert and oriented to person, place, and time. She has normal reflexes.  Skin: Skin is warm and dry. No rash noted.  Psychiatric: She has a normal mood and affect. Her behavior is normal. Judgment and thought content normal.        Assessment & Plan:   1. Bronchitis - predniSONE (STERAPRED UNI-PAK 21 TAB) 10 MG (21) TBPK tablet; As directed x 6 days  Dispense: 21 tablet; Refill: 0 - doxycycline (VIBRA-TABS) 100 MG tablet; Take 1 tablet (100 mg total) by  mouth 2 (two) times daily. 1 po bid  Dispense: 20 tablet; Refill: 0 - albuterol (PROVENTIL HFA;VENTOLIN HFA) 108 (90 Base) MCG/ACT inhaler; Inhale 2 puffs into the lungs every 6 (six) hours as needed for wheezing or shortness of breath.  Dispense: 1 Inhaler; Refill: 5 - budesonide-formoterol (SYMBICORT) 80-4.5 MCG/ACT inhaler; Inhale 2 puffs into the lungs 2 (two) times daily.  Dispense: 1 Inhaler; Refill: 3 - Ambulatory referral to Pulmonology  2. Cough - Ambulatory referral to Pulmonology  3. Dyspnea, unspecified type - albuterol (PROVENTIL HFA;VENTOLIN HFA) 108 (90 Base) MCG/ACT inhaler; Inhale 2 puffs into the lungs every 6 (six) hours as needed for wheezing or shortness of breath.  Dispense: 1 Inhaler; Refill: 5 - budesonide-formoterol (SYMBICORT) 80-4.5 MCG/ACT inhaler; Inhale 2 puffs into the lungs 2 (two) times daily.  Dispense: 1 Inhaler; Refill: 3 - Ambulatory referral to Pulmonology    Current Outpatient Prescriptions:  .  ACCU-CHEK FASTCLIX LANCETS MISC, 1 each by Does not apply route daily. Use to check BG daily or every other day, Disp: 102 each, Rfl: 2 .  cetirizine (ZYRTEC) 10 MG tablet, Take 1 tablet (10 mg total) by mouth daily., Disp: 30 tablet, Rfl: 3 .  cyclobenzaprine (FLEXERIL) 10 MG tablet, Take 1 tablet (10 mg total) by mouth 3 (three) times daily as needed for muscle spasms., Disp: 30 tablet, Rfl: 0 .   dicyclomine (BENTYL) 20 MG tablet, Take 1 tablet (20 mg total) by mouth 4 (four) times daily as needed for spasms., Disp: 60 tablet, Rfl: 1 .  fluticasone (FLONASE) 50 MCG/ACT nasal spray, Place 1 spray into both nostrils daily., Disp: , Rfl:  .  hydrochlorothiazide (HYDRODIURIL) 12.5 MG tablet, Take 1 tablet (12.5 mg total) by mouth daily., Disp: 90 tablet, Rfl: 3 .  ibuprofen (ADVIL,MOTRIN) 200 MG tablet, Take 1,000 mg by mouth every 6 (six) hours as needed., Disp: , Rfl:  .  metFORMIN (GLUCOPHAGE-XR) 500 MG 24 hr tablet, TAKE TWO TABLETS BY MOUTH ONCE DAILY WITH BREAKFAST, Disp: 180 tablet, Rfl: 0 .  metoprolol tartrate (LOPRESSOR) 25 MG tablet, Take 1 tablet (25 mg total) by mouth 2 (two) times daily., Disp: 60 tablet, Rfl: 3 .  norethindrone (ORTHO MICRONOR) 0.35 MG tablet, Take 1 tablet (0.35 mg total) by mouth daily., Disp: 1 Package, Rfl: 11 .  omeprazole (PRILOSEC) 20 MG capsule, Take 1 capsule (20 mg total) by mouth daily. 1 tablet a day, Disp: 30 capsule, Rfl: 6 .  podofilox (CONDYLOX) 0.5 % gel, Apply topically 2 (two) times daily., Disp: 3.5 g, Rfl: 11 .  Prenatal Vit-Fe Fumarate-FA (PNV PRENATAL PLUS MULTIVITAMIN) 27-1 MG TABS, TAKE 1 TABLET BY MOUTH ONCE DAILY, Disp: 30 tablet, Rfl: 11 .  sertraline (ZOLOFT) 50 MG tablet, Take 3 tablets (150 mg total) by mouth daily., Disp: 90 tablet, Rfl: 1 .  spironolactone (ALDACTONE) 100 MG tablet, Take 1 tablet (100 mg total) by mouth daily., Disp: 90 tablet, Rfl: 0 .  albuterol (PROVENTIL HFA;VENTOLIN HFA) 108 (90 Base) MCG/ACT inhaler, Inhale 2 puffs into the lungs every 6 (six) hours as needed for wheezing or shortness of breath., Disp: 1 Inhaler, Rfl: 5 .  budesonide-formoterol (SYMBICORT) 80-4.5 MCG/ACT inhaler, Inhale 2 puffs into the lungs 2 (two) times daily., Disp: 1 Inhaler, Rfl: 3 .  doxycycline (VIBRA-TABS) 100 MG tablet, Take 1 tablet (100 mg total) by mouth 2 (two) times daily. 1 po bid, Disp: 20 tablet, Rfl: 0 .  predniSONE  (STERAPRED UNI-PAK 21 TAB) 10 MG (21) TBPK tablet,  As directed x 6 days, Disp: 21 tablet, Rfl: 0 Continue all other maintenance medications as listed above.  Follow up plan: Return if symptoms worsen or fail to improve.  Educational handout given for Gordon PA-C [facility] [address]  [place], [place] [address] [phone]   10/13/2017, 11:02 AM

## 2017-10-13 NOTE — Telephone Encounter (Signed)
Pt needs prior authorization on Symbicort Pt notified we will try to obtain prior auth Pt understands that this make take 2-3 business days Verbalizes understanding

## 2017-10-13 NOTE — Patient Instructions (Signed)
In a few days you may receive a survey in the mail or online from [employer] regarding your visit with us today. Please take a moment to fill this out. Your feedback is very important to our whole office. It can help us better understand your needs as well as improve your experience and satisfaction. Thank you for taking your time to complete it. We care about you.  Kinza Gouveia, PA-C  

## 2017-10-14 ENCOUNTER — Other Ambulatory Visit: Payer: Self-pay | Admitting: Pediatrics

## 2017-10-14 DIAGNOSIS — M545 Low back pain, unspecified: Secondary | ICD-10-CM

## 2017-10-15 LAB — HM DIABETES EYE EXAM

## 2017-10-18 ENCOUNTER — Encounter: Payer: Self-pay | Admitting: Pediatrics

## 2017-10-19 ENCOUNTER — Telehealth: Payer: Self-pay | Admitting: Pediatrics

## 2017-10-19 NOTE — Telephone Encounter (Signed)
Could not get call through. Attempts will be made again.

## 2017-10-20 ENCOUNTER — Ambulatory Visit (INDEPENDENT_AMBULATORY_CARE_PROVIDER_SITE_OTHER): Payer: Medicaid Other | Admitting: Pediatrics

## 2017-10-20 ENCOUNTER — Encounter: Payer: Self-pay | Admitting: Pediatrics

## 2017-10-20 VITALS — BP 130/81 | HR 85 | Temp 98.3°F | Ht 62.0 in | Wt 351.6 lb

## 2017-10-20 DIAGNOSIS — H3500 Unspecified background retinopathy: Secondary | ICD-10-CM

## 2017-10-20 DIAGNOSIS — E11319 Type 2 diabetes mellitus with unspecified diabetic retinopathy without macular edema: Secondary | ICD-10-CM

## 2017-10-20 DIAGNOSIS — L84 Corns and callosities: Secondary | ICD-10-CM | POA: Diagnosis not present

## 2017-10-20 LAB — BAYER DCA HB A1C WAIVED: HB A1C: 6.5 % (ref <7.0)

## 2017-10-20 MED ORDER — METFORMIN HCL ER 500 MG PO TB24: ORAL_TABLET | ORAL | 3 refills | Status: DC

## 2017-10-20 MED ORDER — ACCU-CHEK FASTCLIX LANCETS MISC: 1.0000 | Freq: Every day | 2 refills | Status: DC

## 2017-10-20 NOTE — Progress Notes (Signed)
  Subjective:   Patient ID: Heidi Landry, female    DOB: [birth year], 31 y.o.   MRN: [id number] CC: A1C check  HPI: Heidi Landry is a 31 y.o. female presenting for A1C check  Decreased vision R eye, found to have retinal hemorrhage Found out last week Has upcoming appt with retinal specialist  Has stopped sodas Using splenda for coffee Newly motivated to get healthier with new eye problems  DM2: new diagnosis Fasting BGLs 130s 2 h after breakfast 160s Has been on prednisone recently for wheezing  Feet have been bothering her, has some pain in heel cracks L foot  Relevant past medical, surgical, family and social history reviewed. Allergies and medications reviewed and updated. History  Smoking Status  . Current Every Day Smoker  . Packs/day: 1.00  . Years: 9.00  . Types: Cigarettes  . Start date: 05/25/2006  Smokeless Tobacco  . Never Used   ROS: Per HPI   Objective:    BP 130/81   Pulse 85   Temp 98.3 F (36.8 C) (Oral)   Ht 5' 2"  (1.575 m)   Wt (!) 351 lb 9.6 oz (159.5 kg)   BMI 64.31 kg/m   Wt Readings from Last 3 Encounters:  10/20/17 (!) 351 lb 9.6 oz (159.5 kg)  10/13/17 (!) 356 lb (161.5 kg)  08/03/17 (!) 352 lb (159.7 kg)    Gen: NAD, alert, cooperative with exam, NCAT EYES: EOMI, no conjunctival injection, or no icterus ENT: OP without erythema LYMPH: no cervical LAD CV: NRRR, normal S1/S2, no murmur, distal pulses 2+ b/l Resp: CTABL, no wheezes, normal WOB Ext: No edema, warm Neuro: Alert and oriented, strength equal b/l UE and LE, coordination grossly normal MSK: normal muscle bulk Skin: Heel calluses b/l, fissuring both heels, L heel more than R No redness  Assessment & Plan:  Heidi Landry was seen today for a1c check.  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 Use to check BG daily or every other day -     metFORMIN (GLUCOPHAGE-XR) 500 MG 24 hr tablet; TAKE TWO TABLETS BY MOUTH twice a day -     Amb ref to Medical Nutrition Therapy-MNT  Retinopathy of both eyes -     Bayer DCA Hb A1c Waived -     Amb ref to Medical Nutrition Therapy-MNT  Morbid obesity ([facility] Decreasing sugar intake, discussed increasing physical activity -     Amb Referral to Bariatric Surgery -     Amb ref to Medical Nutrition Therapy-MNT  Callus of heel -     Ambulatory referral to Podiatry   Follow up plan: Return in about 4 weeks (around 11/17/2017). CAssunta Found MD [place]

## 2017-10-21 ENCOUNTER — Other Ambulatory Visit: Payer: Self-pay

## 2017-10-21 DIAGNOSIS — B399 Histoplasmosis, unspecified: Secondary | ICD-10-CM | POA: Diagnosis not present

## 2017-10-21 DIAGNOSIS — H31091 Other chorioretinal scars, right eye: Secondary | ICD-10-CM | POA: Diagnosis not present

## 2017-10-21 DIAGNOSIS — H2513 Age-related nuclear cataract, bilateral: Secondary | ICD-10-CM | POA: Diagnosis not present

## 2017-10-21 DIAGNOSIS — H35051 Retinal neovascularization, unspecified, right eye: Secondary | ICD-10-CM | POA: Diagnosis not present

## 2017-10-23 ENCOUNTER — Other Ambulatory Visit: Payer: Self-pay | Admitting: Pediatrics

## 2017-10-23 DIAGNOSIS — I10 Essential (primary) hypertension: Secondary | ICD-10-CM

## 2017-10-28 ENCOUNTER — Ambulatory Visit
Admission: RE | Admit: 2017-10-28 | Discharge: 2017-10-28 | Disposition: A | Discharge status: Home / Self Care | Payer: Medicaid Other | Source: Ambulatory Visit | Attending: Physical Medicine and Rehabilitation | Admitting: Physical Medicine and Rehabilitation

## 2017-10-28 DIAGNOSIS — M50823 Other cervical disc disorders at C6-C7 level: Secondary | ICD-10-CM

## 2017-11-04 ENCOUNTER — Ambulatory Visit: Payer: Medicaid Other | Admitting: Pediatrics

## 2017-11-05 ENCOUNTER — Other Ambulatory Visit: Payer: Self-pay | Admitting: Pediatrics

## 2017-11-09 ENCOUNTER — Encounter: Payer: Self-pay | Admitting: Podiatry

## 2017-11-09 ENCOUNTER — Encounter: Payer: Self-pay | Admitting: Pediatrics

## 2017-11-09 ENCOUNTER — Ambulatory Visit: Payer: Medicaid Other | Admitting: Podiatry

## 2017-11-09 DIAGNOSIS — L84 Corns and callosities: Secondary | ICD-10-CM

## 2017-11-09 DIAGNOSIS — E119 Type 2 diabetes mellitus without complications: Secondary | ICD-10-CM

## 2017-11-09 DIAGNOSIS — E11319 Type 2 diabetes mellitus with unspecified diabetic retinopathy without macular edema: Secondary | ICD-10-CM

## 2017-11-09 MED ORDER — UREA 40 % EX OINT: TOPICAL_OINTMENT | CUTANEOUS | 0 refills | Status: DC | PRN

## 2017-11-09 NOTE — Patient Instructions (Signed)

## 2017-11-09 NOTE — Progress Notes (Signed)
Subjective:    Patient ID: Heidi Landry, female    DOB: May 05, 1986, 31 y.o.   MRN: [id number]  HPI  31 year old female presents the office today for concerns of calluses to both of her feet mostly on the heels which is been ongoing for several years but she recently has been diagnosed with diabetes about 1 month ago because of this she presents today for further evaluation.  She denies any drainage or any fissures of the skin.  Denies any open sores.  Occasionally Calci-Chew her with pressure.  She denies any redness or swelling to her feet.  Denies any claudication symptoms.  She has no numbness or tingling.  She has no other concerns.  Review of Systems  All other systems reviewed and are negative.  Past Medical History:  Diagnosis Date  . Anxiety   . Atrial fibrillation ([place])   . Carpal tunnel syndrome, bilateral   . Chest pain   . Chronic back pain   . Degenerative disc disease   . Edema   . Gestational diabetes mellitus, antepartum   . Hypertension   . IBS (irritable bowel syndrome)   . Panic attacks   . Post partum depression   . Sleep apnea   . Spondylosis   . Tendonitis     Past Surgical History:  Procedure Laterality Date  . CESAREAN SECTION N/A 12/08/2015   Procedure: CESAREAN SECTION;  Surgeon: Jonnie Kind, MD;  Location: [facility];  Service: Obstetrics;  Laterality: N/A;  . CHOLECYSTECTOMY       Current Outpatient Medications:  .  ACCU-CHEK AVIVA PLUS test strip, USE TO CHECK BLOOD GLUCOSE ONCE DAILY OR EVERY OTHER DAY, Disp: 100 each, Rfl: 1 .  ACCU-CHEK FASTCLIX LANCETS MISC, 1 each daily by Does not apply route. Use to check BG daily or every other day, Disp: 102 each, Rfl: 2 .  albuterol (PROVENTIL HFA;VENTOLIN HFA) 108 (90 Base) MCG/ACT inhaler, Inhale 2 puffs into the lungs every 6 (six) hours as needed for wheezing or shortness of breath., Disp: 1 Inhaler, Rfl: 5 .  budesonide-formoterol (SYMBICORT) 80-4.5 MCG/ACT inhaler, Inhale 2 puffs into the lungs  2 (two) times daily., Disp: 1 Inhaler, Rfl: 3 .  cetirizine (ZYRTEC) 10 MG tablet, Take 1 tablet (10 mg total) by mouth daily., Disp: 30 tablet, Rfl: 3 .  cyclobenzaprine (FLEXERIL) 10 MG tablet, TAKE 1 TABLET BY MOUTH THREE TIMES DAILY AS NEEDED FOR MUSCLE SPASMS, Disp: 30 tablet, Rfl: 0 .  dicyclomine (BENTYL) 20 MG tablet, Take 1 tablet (20 mg total) by mouth 4 (four) times daily as needed for spasms., Disp: 60 tablet, Rfl: 1 .  doxycycline (VIBRA-TABS) 100 MG tablet, Take 1 tablet (100 mg total) by mouth 2 (two) times daily. 1 po bid, Disp: 20 tablet, Rfl: 0 .  fluticasone (FLONASE) 50 MCG/ACT nasal spray, Place 1 spray into both nostrils daily., Disp: , Rfl:  .  hydrochlorothiazide (HYDRODIURIL) 12.5 MG tablet, Take 1 tablet (12.5 mg total) by mouth daily., Disp: 90 tablet, Rfl: 3 .  ibuprofen (ADVIL,MOTRIN) 200 MG tablet, Take 1,000 mg by mouth every 6 (six) hours as needed., Disp: , Rfl:  .  metFORMIN (GLUCOPHAGE-XR) 500 MG 24 hr tablet, TAKE TWO TABLETS BY MOUTH twice a day, Disp: 120 tablet, Rfl: 3 .  metoprolol tartrate (LOPRESSOR) 25 MG tablet, Take 1 tablet (25 mg total) by mouth 2 (two) times daily., Disp: 60 tablet, Rfl: 3 .  norethindrone (ORTHO MICRONOR) 0.35 MG tablet, Take 1 tablet (0.35  mg total) by mouth daily., Disp: 1 Package, Rfl: 11 .  omeprazole (PRILOSEC) 20 MG capsule, Take 1 capsule (20 mg total) by mouth daily. 1 tablet a day, Disp: 30 capsule, Rfl: 6 .  podofilox (CONDYLOX) 0.5 % gel, Apply topically 2 (two) times daily., Disp: 3.5 g, Rfl: 11 .  Prenatal Vit-Fe Fumarate-FA (PNV PRENATAL PLUS MULTIVITAMIN) 27-1 MG TABS, TAKE 1 TABLET BY MOUTH ONCE DAILY, Disp: 30 tablet, Rfl: 11 .  sertraline (ZOLOFT) 50 MG tablet, Take 3 tablets (150 mg total) by mouth daily., Disp: 90 tablet, Rfl: 1 .  sertraline (ZOLOFT) 50 MG tablet, TAKE 3 TABLETS BY MOUTH ONCE DAILY, Disp: 90 tablet, Rfl: 0 .  spironolactone (ALDACTONE) 100 MG tablet, TAKE 1 TABLET BY MOUTH ONCE DAILY, Disp: 90  tablet, Rfl: 1 .  urea (GORDONS UREA) 40 % ointment, Apply as needed topically., Disp: 30 g, Rfl: 0  No Known Allergies  Social History   Socioeconomic History  . Marital status: Single    Spouse name: Not on file  . Number of children: Not on file  . Years of education: 65  . Highest education level: Not on file  Social Needs  . Financial resource strain: Not on file  . Food insecurity - worry: Not on file  . Food insecurity - inability: Not on file  . Transportation needs - medical: Not on file  . Transportation needs - non-medical: Not on file  Occupational History    Employer: [employer]  Tobacco Use  . Smoking status: Current Every Day Smoker    Packs/day: 1.00    Years: 9.00    Pack years: 9.00    Types: Cigarettes    Start date: 05/25/2006  . Smokeless tobacco: Never Used  Substance and Sexual Activity  . Alcohol use: No    Alcohol/week: 0.0 oz  . Drug use: No  . Sexual activity: Yes    Birth control/protection: Pill  Other Topics Concern  . Not on file  Social History Narrative  . Not on file         Objective:   Physical Exam  General: AAO x3, NAD  Dermatological: Thick hyperkeratotic lesions present bilateral heels.  Upon debridement there is no underlying ulceration, drainage or any signs of infection.  Is mostly on the plantar medial aspect of the heels.  Vascular: Dorsalis Pedis artery and Posterior Tibial artery pedal pulses are 2/4 bilateral with immedate capillary fill time.There is no pain with calf compression, swelling, warmth, erythema.   Neruologic: Grossly intact via light touch bilateral.  Protective threshold with Semmes Wienstein monofilament intact to all pedal sites bilateral.   Musculoskeletal: No gross boney pedal deformities bilateral. No pain, crepitus, or limitation noted with foot and ankle range of motion bilateral. Muscular strength 5/5 in all groups tested bilateral.  Gait: Unassisted, Nonantalgic.     Assessment &  Plan:  31 year old female with thick hyperkeratotic lesions bilaterally -Treatment options discussed including all alternatives, risks, and complications -Etiology of symptoms were discussed -Calluses were sharply debrided x2 without any complications or bleeding -Discussed urea cream and this was sent to her pharmacy. -Daily foot inspection -Follow-up 3 months or sooner if needed  Trula Slade DPM

## 2017-11-10 MED ORDER — ACCU-CHEK FASTCLIX LANCETS MISC: 1.0000 | Freq: Every day | 2 refills | Status: DC

## 2017-11-11 ENCOUNTER — Other Ambulatory Visit: Payer: Self-pay | Admitting: Pediatrics

## 2017-11-11 DIAGNOSIS — F411 Generalized anxiety disorder: Secondary | ICD-10-CM

## 2017-11-13 ENCOUNTER — Other Ambulatory Visit: Payer: Self-pay | Admitting: *Deleted

## 2017-11-13 DIAGNOSIS — E11319 Type 2 diabetes mellitus with unspecified diabetic retinopathy without macular edema: Secondary | ICD-10-CM

## 2017-11-13 MED ORDER — ACCU-CHEK FASTCLIX LANCETS MISC
1.0000 | Freq: Every day | 0 refills | Status: DC
Start: 2017-11-13 — End: 2019-04-11

## 2017-11-13 NOTE — Telephone Encounter (Signed)
Pt is checking her BG QID Refill resent to [facility]

## 2017-11-17 ENCOUNTER — Other Ambulatory Visit: Payer: Self-pay | Admitting: Pediatrics

## 2017-11-17 DIAGNOSIS — J3089 Other allergic rhinitis: Secondary | ICD-10-CM

## 2017-11-18 ENCOUNTER — Encounter: Payer: Self-pay | Admitting: Podiatry

## 2017-11-23 ENCOUNTER — Ambulatory Visit: Payer: Medicaid Other | Admitting: Pediatrics

## 2017-11-23 ENCOUNTER — Encounter: Payer: Self-pay | Admitting: Pediatrics

## 2017-11-23 ENCOUNTER — Ambulatory Visit (INDEPENDENT_AMBULATORY_CARE_PROVIDER_SITE_OTHER): Payer: Medicaid Other

## 2017-11-23 VITALS — BP 133/87 | HR 96 | Temp 98.5°F | Ht 62.0 in | Wt 337.0 lb

## 2017-11-23 DIAGNOSIS — N898 Other specified noninflammatory disorders of vagina: Secondary | ICD-10-CM

## 2017-11-23 DIAGNOSIS — M25561 Pain in right knee: Secondary | ICD-10-CM

## 2017-11-23 DIAGNOSIS — E119 Type 2 diabetes mellitus without complications: Secondary | ICD-10-CM | POA: Insufficient documentation

## 2017-11-23 DIAGNOSIS — Z6841 Body Mass Index (BMI) 40.0 and over, adult: Secondary | ICD-10-CM

## 2017-11-23 LAB — URINALYSIS, COMPLETE
Bilirubin, UA: NEGATIVE
Glucose, UA: NEGATIVE
Ketones, UA: NEGATIVE
Leukocytes, UA: NEGATIVE
NITRITE UA: NEGATIVE
PH UA: 5.5 (ref 5.0–7.5)
Protein, UA: NEGATIVE
RBC, UA: NEGATIVE
Specific Gravity, UA: 1.025 (ref 1.005–1.030)
UUROB: 0.2 mg/dL (ref 0.2–1.0)

## 2017-11-23 LAB — MICROSCOPIC EXAMINATION
Epithelial Cells (non renal): >10 /hpf — AB (ref 0–10)
RBC MICROSCOPIC, UA: NONE SEEN /HPF (ref 0–?)
RENAL EPITHEL UA: NONE SEEN /HPF

## 2017-11-23 LAB — WET PREP FOR TRICH, YEAST, CLUE
Clue Cell Exam: POSITIVE — AB
TRICHOMONAS EXAM: NEGATIVE
YEAST EXAM: NEGATIVE

## 2017-11-23 NOTE — Progress Notes (Signed)
  Subjective:   Patient ID: Heidi Landry, female    DOB: Feb 10, 1986, 31 y.o.   MRN: [id number] CC: Follow-up and Right knee pain  HPI: Heidi Landry is a 31 y.o. 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 Allergies and medications reviewed and updated. Social History   Tobacco Use  Smoking Status Current Every Day Smoker  . Packs/day: 1.00  . Years: 9.00  . Pack years: 9.00  . Types: Cigarettes  . Start date: 05/25/2006  Smokeless Tobacco Never Used   ROS: Per HPI   Objective:    BP 133/87   Pulse 96   Temp 98.5 F (36.9 C) (Oral)   Ht 5' 2"  (1.575 m)   Wt (!) 337 lb (152.9 kg)   BMI 61.64 kg/m   Wt Readings from Last 3 Encounters:  11/23/17 (!) 337 lb (152.9 kg)  10/20/17 (!) 351 lb 9.6 oz (159.5 kg)  10/13/17 (!) 356 lb (161.5 kg)    Gen: NAD, alert, cooperative with exam, NCAT EYES: EOMI, no conjunctival injection, or no icterus ENT:OP without erythema LYMPH: no cervical LAD CV: NRRR, normal S1/S2, no murmur, distal pulses 2+ b/l Resp: CTABL, no wheezes, normal WOB Abd: +BS, soft, NTND. no guarding or organomegaly Ext: No edema, warm Neuro: Alert and oriented, strength equal b/l UE and LE, coordination grossly normal MSK: normal muscle bulk  Assessment & Plan:  [name] was seen today for follow-up and right knee pain.  Diagnoses and all orders for this visit:  Acute pain of right knee Xray  without fracture, rest as needed -     DG Knee 1-2 Views Right; Future  Type 2 diabetes mellitus without complication, without long-term current use of insulin ([facility]) BGLs in low 100s -     Microalbumin / creatinine urine ratio  Vaginal irritation -     Urinalysis, Complete -     Urine Culture -     WET PREP FOR TRICH, YEAST, CLUE  Class 3 severe obesity due to excess calories with serious comorbidity and body mass index (BMI) of 60.0 to 69.9 in adult [facility]) Has had wt loss with diet change, congratulated on success Pt pleased, plannign to continue efforts Avoiding sodas, eating more vegetables  Other orders -     Microscopic Examination   Follow up plan: Return in about 3 months (around 02/23/2018). Assunta Found, MD [place]

## 2017-11-23 NOTE — Patient Instructions (Signed)
Check blood sugars in the morning, fasting Goal 90s-120s  If regularly elevated let me know Bring numbers to next clinic visit  Great work on weight loss!!

## 2017-11-24 LAB — MICROALBUMIN / CREATININE URINE RATIO
CREATININE, UR: 130.9 mg/dL
MICROALB/CREAT RATIO: 26 mg/g{creat} (ref 0.0–30.0)
MICROALBUM., U, RANDOM: 34 ug/mL

## 2017-11-24 LAB — URINE CULTURE

## 2017-11-26 ENCOUNTER — Other Ambulatory Visit: Payer: Self-pay | Admitting: Pediatrics

## 2017-11-26 ENCOUNTER — Encounter: Payer: Self-pay | Admitting: Pediatrics

## 2017-11-26 DIAGNOSIS — B9689 Other specified bacterial agents as the cause of diseases classified elsewhere: Secondary | ICD-10-CM

## 2017-11-26 DIAGNOSIS — N760 Acute vaginitis: Principal | ICD-10-CM

## 2017-11-26 MED ORDER — METRONIDAZOLE 500 MG PO TABS: 500.0000 mg | ORAL_TABLET | Freq: Two times a day (BID) | ORAL | 0 refills | Status: DC

## 2017-12-03 ENCOUNTER — Ambulatory Visit: Payer: Self-pay | Admitting: Pulmonary Disease

## 2017-12-13 ENCOUNTER — Other Ambulatory Visit: Payer: Self-pay | Admitting: Pediatrics

## 2017-12-13 DIAGNOSIS — F411 Generalized anxiety disorder: Secondary | ICD-10-CM

## 2017-12-31 ENCOUNTER — Encounter: Payer: Self-pay | Admitting: Pulmonary Disease

## 2018-01-05 ENCOUNTER — Ambulatory Visit: Payer: Self-pay | Admitting: Pulmonary Disease

## 2018-01-13 ENCOUNTER — Other Ambulatory Visit: Payer: Self-pay | Admitting: Pediatrics

## 2018-01-13 DIAGNOSIS — F411 Generalized anxiety disorder: Secondary | ICD-10-CM

## 2018-01-13 DIAGNOSIS — M545 Low back pain, unspecified: Secondary | ICD-10-CM

## 2018-01-28 ENCOUNTER — Other Ambulatory Visit: Payer: Self-pay | Admitting: Podiatry

## 2018-01-28 ENCOUNTER — Other Ambulatory Visit: Payer: Self-pay | Admitting: Physician Assistant

## 2018-01-28 ENCOUNTER — Encounter: Payer: Self-pay | Admitting: Obstetrics & Gynecology

## 2018-01-28 ENCOUNTER — Other Ambulatory Visit: Payer: Self-pay | Admitting: Pediatrics

## 2018-01-28 DIAGNOSIS — J40 Bronchitis, not specified as acute or chronic: Secondary | ICD-10-CM

## 2018-01-28 DIAGNOSIS — M545 Low back pain, unspecified: Secondary | ICD-10-CM

## 2018-01-28 DIAGNOSIS — F411 Generalized anxiety disorder: Secondary | ICD-10-CM

## 2018-01-28 DIAGNOSIS — N760 Acute vaginitis: Principal | ICD-10-CM

## 2018-01-28 DIAGNOSIS — B9689 Other specified bacterial agents as the cause of diseases classified elsewhere: Secondary | ICD-10-CM

## 2018-01-28 MED ORDER — CYCLOBENZAPRINE HCL 10 MG PO TABS: 10.0000 mg | ORAL_TABLET | Freq: Three times a day (TID) | ORAL | 0 refills | Status: DC | PRN

## 2018-01-28 MED ORDER — UREA 40 % EX OINT: TOPICAL_OINTMENT | CUTANEOUS | 0 refills | Status: DC | PRN

## 2018-01-28 MED ORDER — SERTRALINE HCL 50 MG PO TABS: 150.0000 mg | ORAL_TABLET | Freq: Every day | ORAL | 0 refills | Status: DC

## 2018-01-29 ENCOUNTER — Other Ambulatory Visit: Payer: Self-pay | Admitting: Obstetrics & Gynecology

## 2018-01-29 MED ORDER — NORETHINDRONE 0.35 MG PO TABS: 1.0000 | ORAL_TABLET | Freq: Every day | ORAL | 12 refills | Status: DC

## 2018-02-03 ENCOUNTER — Encounter: Payer: Self-pay | Admitting: Pulmonary Disease

## 2018-02-03 ENCOUNTER — Ambulatory Visit: Payer: Medicaid Other | Admitting: Pulmonary Disease

## 2018-02-03 VITALS — BP 124/72 | HR 102 | Ht 63.0 in | Wt 348.0 lb

## 2018-02-03 DIAGNOSIS — J40 Bronchitis, not specified as acute or chronic: Secondary | ICD-10-CM | POA: Diagnosis not present

## 2018-02-03 DIAGNOSIS — R0602 Shortness of breath: Secondary | ICD-10-CM

## 2018-02-03 DIAGNOSIS — R059 Cough, unspecified: Secondary | ICD-10-CM

## 2018-02-03 DIAGNOSIS — F17200 Nicotine dependence, unspecified, uncomplicated: Secondary | ICD-10-CM

## 2018-02-03 DIAGNOSIS — J329 Chronic sinusitis, unspecified: Secondary | ICD-10-CM | POA: Insufficient documentation

## 2018-02-03 DIAGNOSIS — R05 Cough: Secondary | ICD-10-CM

## 2018-02-03 NOTE — Progress Notes (Signed)
Subjective:     Patient ID: Heidi Landry, female   DOB: May 12, 1986, 32 y.o.   MRN: [id number]  HPI   Chief Complaint  Patient presents with  . pulmonary consult    per Dr. Ronnald Ramp. dx with Bronchitis 09/2017. pt reports of sob with exertion & dry cough at times prod cough (unsure of color) x54m   32year old obese woman presents to establish care for evaluation of recurrent bronchitis. She reports on current dose about once a year even as a 32year-old.  Her last such attack was October 2018 when she needed antibiotics and albuterol to get over this.  She was also given a prescription for Symbicort which was not covered by her insurance and she never took.  She reports chronic sinus congestion and a history suggestive perennial rhinitis and sinusitis for which she takes Zyrtec.  She does have some hypersensitivity to bleach with increasing dyspnea but denies problems with perfumes or odors or smoke. She smokes about a pack per day for 10 years, starting at age 32  She has diagnosis of OSA and is maintained on CPAP, [place] She underwent cardiac evaluation 2 years ago including echo in 2016 which was normal   Chest x-ray 01/2015 does not show any infiltrates or effusions  spirometry shows mild restriction with ratio 94, FEV1 of 78% and FVC of 70%   She is disabled and lives with her husband. She is family history of lung and colon cancer in the parents  Past Medical History:  Diagnosis Date  . Anxiety   . Atrial fibrillation ([facility]ae   . Carpal tunnel syndrome, bilateral   . Chest pain   . Chronic back pain   . Degenerative disc disease   . Edema   . Gestational diabetes mellitus, antepartum   . Hypertension   . IBS (irritable bowel syndrome)   . Panic attacks   . Post partum depression   . Sleep apnea   . Spondylosis   . Tendonitis      Review of Systems  Constitutional: Negative for fever and unexpected weight change.  HENT: Negative for  congestion, dental problem, ear pain, nosebleeds, postnasal drip, rhinorrhea, sinus pressure, sneezing, sore throat and trouble swallowing.   Eyes: Negative for redness and itching.  Respiratory: Positive for cough and shortness of breath. Negative for chest tightness and wheezing.   Cardiovascular: Positive for palpitations. Negative for leg swelling.  Gastrointestinal: Negative for nausea and vomiting.  Genitourinary: Negative for dysuria.  Musculoskeletal: Negative for joint swelling.  Skin: Negative for rash.  Neurological: Negative for headaches.  Hematological: Does not bruise/bleed easily.  Psychiatric/Behavioral: Negative for dysphoric mood. The patient is nervous/anxious.        Objective:   Physical Exam  Gen. Pleasant, obese, in no distress, normal affect ENT - no lesions, no post nasal drip, class 2-3 airway Neck: No JVD, no thyromegaly, no carotid bruits Lungs: no use of accessory muscles, no dullness to percussion, decreased without rales or rhonchi  Cardiovascular: Rhythm regular, heart sounds  normal, no murmurs or gallops, no peripheral edema Abdomen: soft and non-tender, no hepatosplenomegaly, BS normal. Musculoskeletal: No deformities, no cyanosis or clubbing Neuro:  alert, non focal, no tremors     Assessment:         Plan:

## 2018-02-03 NOTE — Assessment & Plan Note (Signed)
Smoking cessation paramount and this was emphasized

## 2018-02-03 NOTE — Patient Instructions (Signed)
Doubt that you have asthma. Lung function shows restrictive pattern You do need to quit smoking!  CT scan of sinuses

## 2018-02-03 NOTE — Assessment & Plan Note (Signed)
Unclear cause but chronic sinusitis could be the most obvious reason. We will proceed with CT scan of the sinuses. Smoking cessation paramount and this was emphasized.  She will follow-up with [place] on an as-needed basis but she if she indeed does have recurrent attacks of bronchitis and we may consider setting with a high-resolution CT scan of the chest to look for bronchiectasis

## 2018-02-08 ENCOUNTER — Ambulatory Visit (INDEPENDENT_AMBULATORY_CARE_PROVIDER_SITE_OTHER): Payer: Medicaid Other

## 2018-02-08 ENCOUNTER — Encounter: Payer: Self-pay | Admitting: Podiatry

## 2018-02-08 ENCOUNTER — Ambulatory Visit: Payer: Medicaid Other | Admitting: Podiatry

## 2018-02-08 DIAGNOSIS — L989 Disorder of the skin and subcutaneous tissue, unspecified: Secondary | ICD-10-CM

## 2018-02-08 DIAGNOSIS — E119 Type 2 diabetes mellitus without complications: Secondary | ICD-10-CM

## 2018-02-08 DIAGNOSIS — S93402A Sprain of unspecified ligament of left ankle, initial encounter: Secondary | ICD-10-CM

## 2018-02-08 DIAGNOSIS — S93412A Sprain of calcaneofibular ligament of left ankle, initial encounter: Secondary | ICD-10-CM

## 2018-02-08 NOTE — Patient Instructions (Signed)
Ankle Sprain, Phase I Rehab Ask your health care provider which exercises are safe for you. Do exercises exactly as told by your health care provider and adjust them as directed. It is normal to feel mild stretching, pulling, tightness, or discomfort as you do these exercises, but you should stop right away if you feel sudden pain or your pain gets worse.Do not begin these exercises until told by your health care provider. Stretching and range of motion exercises These exercises warm up your muscles and joints and improve the movement and flexibility of your lower leg and ankle. These exercises also help to relieve pain and stiffness. Exercise A: Gastroc and soleus stretch  1. Sit on the floor with your left / right leg extended. 2. Loop a belt or towel around the ball of your left / right foot. The ball of your foot is on the walking surface, right under your toes. 3. Keep your left / right ankle and foot relaxed and keep your knee straight while you use the belt or towel to pull your foot toward you. You should feel a gentle stretch behind your calf or knee. 4. Hold this position for __________ seconds, then release to the starting position. Repeat the exercise with your knee bent. You can put a pillow or a rolled bath towel under your knee to support it. You should feel a stretch deep in your calf or at your Achilles tendon. Repeat each stretch __________ times. Complete these stretches __________ times a day. Exercise B: Ankle alphabet  1. Sit with your left / right leg supported at the lower leg. ? Do not rest your foot on anything. ? Make sure your foot has room to move freely. 2. Think of your left / right foot as a paintbrush, and move your foot to trace each letter of the alphabet in the air. Keep your hip and knee still while you trace. Make the letters as large as you can without feeling discomfort. 3. Trace every letter from A to Z. Repeat __________ times. Complete this exercise  __________ times a day. Strengthening exercises These exercises build strength and endurance in your ankle and lower leg. Endurance is the ability to use your muscles for a long time, even after they get tired. Exercise C: Dorsiflexors  1. Secure a rubber exercise band or tube to an object, such as a table leg, that will stay still when the band is pulled. Secure the other end around your left / right foot. 2. Sit on the floor facing the object, with your left / right leg extended. The band or tube should be slightly tense when your foot is relaxed. 3. Slowly bring your foot toward you, pulling the band tighter. 4. Hold this position for __________ seconds. 5. Slowly return your foot to the starting position. Repeat __________ times. Complete this exercise __________ times a day. Exercise D: Plantar flexors  1. Sit on the floor with your left / right leg extended. 2. Loop a rubber exercise tube or band around the ball of your left / right foot. The ball of your foot is on the walking surface, right under your toes. ? Hold the ends of the band or tube in your hands. ? The band or tube should be slightly tense when your foot is relaxed. 3. Slowly point your foot and toes downward, pushing them away from you. 4. Hold this position for __________ seconds. 5. Slowly return your foot to the starting position. Repeat __________ times. Complete  this exercise __________ times a day. Exercise E: Evertors 1. Sit on the floor with your legs straight out in front of you. 2. Loop a rubber exercise band or tube around the ball of your left / right foot. The ball of your foot is on the walking surface, right under your toes. ? Hold the ends of the band in your hands, or secure the band to a stable object. ? The band or tube should be slightly tense when your foot is relaxed. 3. Slowly push your foot outward, away from your other leg. 4. Hold this position for __________ seconds. 5. Slowly return your  foot to the starting position. Repeat __________ times. Complete this exercise __________ times a day. This information is not intended to replace advice given to you by your health care provider. Make sure you discuss any questions you have with your health care provider. Document Released: 07/16/2005 Document Revised: 08/21/2016 Document Reviewed: 10/29/2015 Elsevier Interactive Patient Education  2018 [employer].

## 2018-02-12 ENCOUNTER — Ambulatory Visit (INDEPENDENT_AMBULATORY_CARE_PROVIDER_SITE_OTHER)
Admission: RE | Admit: 2018-02-12 | Discharge: 2018-02-12 | Disposition: A | Discharge status: Home / Self Care | Payer: Medicaid Other | Source: Ambulatory Visit | Attending: Pulmonary Disease | Admitting: Pulmonary Disease

## 2018-02-12 DIAGNOSIS — R05 Cough: Secondary | ICD-10-CM | POA: Diagnosis not present

## 2018-02-12 DIAGNOSIS — R059 Cough, unspecified: Secondary | ICD-10-CM

## 2018-02-15 ENCOUNTER — Telehealth: Payer: Self-pay | Admitting: Pulmonary Disease

## 2018-02-15 NOTE — Progress Notes (Signed)
Subjective: 32 year old female presents the office with concerns of thick calluses to both of her heels that showed to have trimmed.  She is been trying to put moisturizer on the area daily which is been helping some of the areas to get thick and so that the area trimmed.  She denies any  open sores.  She also states that she fell injuring her left ankle last week.  She said no recent treatment for this.  The pain has improved.  She denies any other injury at the time of the fall.  She has no other concerns today. Denies any systemic complaints such as fevers, chills, nausea, vomiting. No acute changes since last appointment, and no other complaints at this time.   Objective: AAO x3, NAD DP/PT pulses palpable bilaterally, CRT less than 3 seconds Thick hyperkeratotic lesions present to bilateral heels.  Upon debridement there is no underlying ulceration, drainage or any signs of infection noted. No open lesions or pre-ulcerative lesions.  There is tenderness palpation along the left ankle along the lateral aspect along the course of the ATFL.  There is no significant discomfort to the bowel or the PTFL.  No pain to the syndesmosis, deltoid ligaments, Achilles tendon.  Achilles tendon appears to be intact.  No area pinpoint bony tenderness or pain to vibratory sensation.  No pain in the toes, fifth metatarsal base or other areas of the foot. No pain with calf compression, swelling, warmth, erythema  Assessment: Ankle sprain left side; hyperkeratotic lesions  Plan: -All treatment options discussed with the patient including all alternatives, risks, complications.  -Hyperkeratotic lesions were sharply debrided x2 without any complications or bleeding.  Continue with moisturizer to the area daily. -X-rays were obtained and reviewed his left ankle which not reveal any evidence of acute fracture.  Calcaneal spurring is present.  Joint space maintained.  We discussed an ankle brace however pain is minimal  today so we held off on this.  I want her to continue to ice and elevate and limit activity as much as she can.  We also discussed stretching, rehab exercises.  If symptoms do not improve the next 2-3 weeks I would like to see her back. -Patient encouraged to call the office with any questions, concerns, change in symptoms.  She has no further questions or concerns today.  Trula Slade DPM

## 2018-02-15 NOTE — Progress Notes (Signed)
LMTCB on preferred phone number listed for patient.

## 2018-02-15 NOTE — Telephone Encounter (Signed)
Called and spoke with patient she is aware of results and verbalized understanding.

## 2018-02-22 ENCOUNTER — Encounter: Payer: Self-pay | Admitting: Podiatry

## 2018-02-22 ENCOUNTER — Encounter: Payer: Self-pay | Admitting: Pulmonary Disease

## 2018-02-23 ENCOUNTER — Telehealth: Payer: Self-pay | Admitting: Pulmonary Disease

## 2018-02-23 NOTE — Telephone Encounter (Signed)
Will close this message since patient has also requested this information via MyChart.

## 2018-02-23 NOTE — Telephone Encounter (Signed)
Patient is asking for a letter stating her lung condition. She was referred to you for evaluation for her shortness of breath.   Is it ok to write a letter for her?

## 2018-02-24 ENCOUNTER — Telehealth: Payer: Self-pay | Admitting: Pulmonary Disease

## 2018-02-24 NOTE — Telephone Encounter (Signed)
Difficult for me to write a letter. We did not really establish any lung related diagnosis CT scan of her sinuses was negative

## 2018-02-25 ENCOUNTER — Encounter: Payer: Self-pay | Admitting: Pediatrics

## 2018-02-25 ENCOUNTER — Ambulatory Visit: Payer: Medicaid Other | Admitting: Pediatrics

## 2018-02-25 ENCOUNTER — Telehealth: Payer: Self-pay | Admitting: Podiatry

## 2018-02-25 VITALS — BP 129/79 | HR 95 | Temp 98.3°F | Ht 63.0 in | Wt 350.6 lb

## 2018-02-25 DIAGNOSIS — E119 Type 2 diabetes mellitus without complications: Secondary | ICD-10-CM | POA: Diagnosis not present

## 2018-02-25 DIAGNOSIS — F17200 Nicotine dependence, unspecified, uncomplicated: Secondary | ICD-10-CM

## 2018-02-25 DIAGNOSIS — K582 Mixed irritable bowel syndrome: Secondary | ICD-10-CM | POA: Diagnosis not present

## 2018-02-25 DIAGNOSIS — N898 Other specified noninflammatory disorders of vagina: Secondary | ICD-10-CM

## 2018-02-25 DIAGNOSIS — I10 Essential (primary) hypertension: Secondary | ICD-10-CM

## 2018-02-25 LAB — WET PREP FOR TRICH, YEAST, CLUE
CLUE CELL EXAM: NEGATIVE
Trichomonas Exam: NEGATIVE
Yeast Exam: NEGATIVE

## 2018-02-25 LAB — BAYER DCA HB A1C WAIVED: HB A1C (BAYER DCA - WAIVED): 6.1 % (ref <7.0)

## 2018-02-25 NOTE — Telephone Encounter (Signed)
Pt called me back to let me know she would like for me to release her records to her via [place]. I told her I would do that and that I'm still waiting on Dr. Jacqualyn Posey to write the letter and once he does I will try releasing that via MyChart as well to her.

## 2018-02-25 NOTE — Progress Notes (Signed)
  Subjective:   Patient ID: Sherilyn Banker, female    DOB: 1986/05/05, 32 y.o.   MRN: [id number] CC: Follow-up (3 month) multiple med problems HPI: Jamicia Haaland is a 31 y.o. female presenting for Follow-up (3 month)  Diabetes: taking metformin. Drinking sodas a few days a week.  Elevated BMI:  has had some weight gain, she says started at [place] when at family house, havign bigger portions than usual  OSA: using CPAP regularly, follows with Dr. Luan Pulling. Started on medicine for RLS.   Taking aleve for low back pain and knee pain. Helps some.   Hurt ankle2 weeks ago, saw her podiatrist. Had xrays, improving.   Mood has been good, says feeling a lot better. Taking sertraline daily  Has a crampy feeling in vaginal area, has a new clear discharge, sometimes thick.   Relevant past medical, surgical, family and social history reviewed. Allergies and medications reviewed and updated. Social History   Tobacco Use  Smoking Status Current Every Day Smoker  . Packs/day: 1.00  . Years: 9.00  . Pack years: 9.00  . Types: Cigarettes  . Start date: 05/25/2006  Smokeless Tobacco Never Used   ROS: Per HPI   Objective:    BP 129/79   Pulse 95   Temp 98.3 F (36.8 C) (Oral)   Ht 5' 3"  (1.6 m)   Wt (!) 350 lb 9.6 oz (159 kg)   LMP 02/07/2018   BMI 62.11 kg/m   Wt Readings from Last 3 Encounters:  02/25/18 (!) 350 lb 9.6 oz (159 kg)  02/03/18 (!) 348 lb (157.9 kg)  11/23/17 (!) 337 lb (152.9 kg)    Gen: NAD, alert, cooperative with exam, NCAT EYES: EOMI, no conjunctival injection, or no icterus CV: NRRR, normal S1/S2, no murmur, distal pulses 2+ b/l Resp: CTABL, no wheezes, normal WOB Ext: No edema, warm Neuro: Alert and oriented, strength equal b/l UE and LE, coordination grossly normal MSK: normal muscle bulk Psych: mood has been fine  Assessment & Plan:  Alexxis was seen today for follow-up med problems  Diagnoses and all orders for this visit:  Essential  hypertension Stable, cont meds -     BMP8+EGFR -     Lipid panel  Irritable bowel syndrome with both constipation and diarrhea Stable, cont bentyl prn  Type 2 diabetes mellitus without complication, without long-term current use of insulin (HCC) A1c improved to 6.1, cont to decrease sodas.  -     BMP8+EGFR -     Lipid panel -     Bayer DCA Hb A1c Waived -     Amb ref to Medical Nutrition Therapy-MNT  Smoker -     BMP8+EGFR -     Lipid panel  Morbid obesity ([place]) Continue to work on increasing physical activity, decreasing soda intake and managing portion sizes.  -     Amb ref to Medical Nutrition Therapy-MNT  Vaginal discharge -     WET PREP FOR [place], YEAST, CLUE   Follow up plan: 3 mo Assunta Found, MD [place]

## 2018-02-25 NOTE — Telephone Encounter (Signed)
Attempted to call pt in regards to her medical records request. Left message for her to call me back directly at [phone] and to let me know if she still wants to pick her records up in our office or would she like me to release them to her via [place].

## 2018-02-26 ENCOUNTER — Telehealth: Payer: Self-pay | Admitting: Cardiology

## 2018-02-26 ENCOUNTER — Encounter: Payer: Self-pay | Admitting: Podiatry

## 2018-02-26 ENCOUNTER — Telehealth: Payer: Self-pay | Admitting: Podiatry

## 2018-02-26 LAB — BMP8+EGFR
BUN/Creatinine Ratio: 19 (ref 9–23)
BUN: 13 mg/dL (ref 6–20)
CALCIUM: 9.4 mg/dL (ref 8.7–10.2)
CO2: 25 mmol/L (ref 20–29)
Chloride: 98 mmol/L (ref 96–106)
Creatinine, Ser: 0.67 mg/dL (ref 0.57–1.00)
GFR calc Af Amer: 135 mL/min/{1.73_m2} (ref >59)
GFR, EST NON AFRICAN AMERICAN: 117 mL/min/{1.73_m2} (ref >59)
Glucose: 115 mg/dL — ABNORMAL HIGH (ref 65–99)
POTASSIUM: 4.6 mmol/L (ref 3.5–5.2)
Sodium: 140 mmol/L (ref 134–144)

## 2018-02-26 LAB — LIPID PANEL
CHOL/HDL RATIO: 4.6 ratio — AB (ref 0.0–4.4)
CHOLESTEROL TOTAL: 175 mg/dL (ref 100–199)
HDL: 38 mg/dL — AB (ref >39)
LDL Calculated: 88 mg/dL (ref 0–99)
TRIGLYCERIDES: 244 mg/dL — AB (ref 0–149)
VLDL Cholesterol Cal: 49 mg/dL — ABNORMAL HIGH (ref 5–40)

## 2018-02-26 NOTE — Telephone Encounter (Signed)
Last seen February last year & f/u was prn.  Informed patient of this & stated that she would just like to get her medical records then.  Informed patient that she would need come to office to sign release.  She verbalized understanding.

## 2018-02-26 NOTE — Telephone Encounter (Signed)
Called pt back to let her know that I e-mailed her the signed letter by Dr. Jacqualyn Posey. Pt stated she was unable to access her office visit notes through [place] saying she needed to log into the web site. I told her to go [email or web address] and to log in there. Told pt to call and let me know if she could access her notes that way and also if she got her letter in the e-mail I sent to her.

## 2018-02-26 NOTE — Telephone Encounter (Signed)
Heidi Landry, this is [name]. I was calling to let you know that I did not receive Dr. Leigh Aurora letter through [place]. For some reason it did not show up. So when you get this will you call me at [phone]. Thank you.

## 2018-02-26 NOTE — Telephone Encounter (Signed)
Checked RA's folder to see if there were any records in there regarding pt and we have not received anything yet.  Called pt and left a message for her stating this information.

## 2018-02-26 NOTE — Telephone Encounter (Signed)
Patient needs letter stating why she is a patient

## 2018-02-26 NOTE — Telephone Encounter (Signed)
Left message to return call 

## 2018-02-26 NOTE — Telephone Encounter (Signed)
Pt is calling back about letter about her diagnosis and procedures that were performed. Medical Release Form was signed and pt would like to know if we have obtained her records. Cb is [phone].

## 2018-03-01 ENCOUNTER — Encounter: Payer: Self-pay | Admitting: Pediatrics

## 2018-03-01 NOTE — Telephone Encounter (Signed)
OK to offer mom next available new pt spot for [place]

## 2018-03-02 NOTE — Telephone Encounter (Signed)
There are two message, one email and one telephone encounter. I want to make sure this is taken care of. Original message below.   To: [facility]    From: Sarayah Bacchi    Created: 02/22/2018 4:45 PM     *-*-*This message was handled on 02/22/2018 4:58 PM by Jannette Spanner*-*-*  Hey! My lawyer needs my medical records from you guys and a letter from dr. Elsworth Soho explaining my condition and everything thats going on with me. Thanks!

## 2018-03-02 NOTE — Telephone Encounter (Signed)
I have seen her once for recurrent bronchitis and OSA.  CT scan was negative for sinusitis We can send notes of last encounter to her lawyer

## 2018-03-05 ENCOUNTER — Other Ambulatory Visit: Payer: Self-pay | Admitting: Pediatrics

## 2018-03-05 DIAGNOSIS — M545 Low back pain, unspecified: Secondary | ICD-10-CM

## 2018-03-13 DIAGNOSIS — M545 Low back pain: Secondary | ICD-10-CM | POA: Insufficient documentation

## 2018-03-13 DIAGNOSIS — M5136 Other intervertebral disc degeneration, lumbar region: Secondary | ICD-10-CM | POA: Insufficient documentation

## 2018-03-13 DIAGNOSIS — G8929 Other chronic pain: Secondary | ICD-10-CM | POA: Insufficient documentation

## 2018-03-16 ENCOUNTER — Other Ambulatory Visit: Payer: Self-pay | Admitting: Pediatrics

## 2018-03-16 DIAGNOSIS — F411 Generalized anxiety disorder: Secondary | ICD-10-CM

## 2018-03-18 ENCOUNTER — Telehealth: Payer: Self-pay

## 2018-03-18 DIAGNOSIS — F411 Generalized anxiety disorder: Secondary | ICD-10-CM

## 2018-03-18 NOTE — Telephone Encounter (Signed)
Medicaid denying Sertraline 50 mg TID  Exceeds adult recommended dose

## 2018-03-18 NOTE — Telephone Encounter (Signed)
She is taking 150 mg once a day, not 50 mg 3 times daily.  Could that be what the problem is? Is there a way to appeal that decision?  Maximum daily dose is 200 mg/day which she is under.  This is not a new prescription, she has been on this dose for some time and it went through insurance fine in the past.

## 2018-03-28 ENCOUNTER — Other Ambulatory Visit: Payer: Self-pay | Admitting: Pediatrics

## 2018-03-28 DIAGNOSIS — J3089 Other allergic rhinitis: Secondary | ICD-10-CM

## 2018-04-10 ENCOUNTER — Other Ambulatory Visit: Payer: Self-pay | Admitting: Pediatrics

## 2018-04-10 DIAGNOSIS — M545 Low back pain, unspecified: Secondary | ICD-10-CM

## 2018-04-12 NOTE — Telephone Encounter (Signed)
Last seen 02/25/18  Dr Evette Doffing

## 2018-04-16 ENCOUNTER — Other Ambulatory Visit: Payer: Self-pay | Admitting: Pediatrics

## 2018-04-16 DIAGNOSIS — F411 Generalized anxiety disorder: Secondary | ICD-10-CM

## 2018-04-20 ENCOUNTER — Other Ambulatory Visit: Payer: Self-pay | Admitting: Pediatrics

## 2018-04-20 DIAGNOSIS — I10 Essential (primary) hypertension: Secondary | ICD-10-CM

## 2018-04-20 DIAGNOSIS — F411 Generalized anxiety disorder: Secondary | ICD-10-CM

## 2018-04-26 ENCOUNTER — Ambulatory Visit: Payer: Self-pay | Admitting: Nutrition

## 2018-05-10 ENCOUNTER — Ambulatory Visit (INDEPENDENT_AMBULATORY_CARE_PROVIDER_SITE_OTHER): Payer: Medicaid Other | Admitting: Podiatry

## 2018-05-10 ENCOUNTER — Encounter: Payer: Self-pay | Admitting: Podiatry

## 2018-05-10 DIAGNOSIS — I739 Peripheral vascular disease, unspecified: Secondary | ICD-10-CM | POA: Diagnosis not present

## 2018-05-10 DIAGNOSIS — L539 Erythematous condition, unspecified: Secondary | ICD-10-CM

## 2018-05-11 ENCOUNTER — Telehealth: Payer: Self-pay | Admitting: *Deleted

## 2018-05-11 ENCOUNTER — Other Ambulatory Visit: Payer: Self-pay | Admitting: Pediatrics

## 2018-05-11 ENCOUNTER — Ambulatory Visit: Payer: Medicaid Other | Admitting: Podiatry

## 2018-05-11 DIAGNOSIS — L539 Erythematous condition, unspecified: Secondary | ICD-10-CM

## 2018-05-11 DIAGNOSIS — I739 Peripheral vascular disease, unspecified: Secondary | ICD-10-CM

## 2018-05-11 DIAGNOSIS — I10 Essential (primary) hypertension: Secondary | ICD-10-CM

## 2018-05-11 DIAGNOSIS — M545 Low back pain, unspecified: Secondary | ICD-10-CM

## 2018-05-11 NOTE — Telephone Encounter (Signed)
Faxed orders to [facility]. 

## 2018-05-11 NOTE — Telephone Encounter (Signed)
-----   Message from Trula Slade, DPM sent at 05/11/2018  7:20 AM EDT ----- Can you please order ABI/TBI due to toe discoloration? Thanks.

## 2018-05-11 NOTE — Progress Notes (Signed)
Subjective: 32 year old female presents the office today for concerns of swelling and redness to all of her toes which is been ongoing for about 2 weeks.  She denies any white or purple discoloration.  Denies any ulcerations.  She will occasionally get tingling sensation to the toes.  She denies any cramps in her legs when she walks or any heaviness to her legs.  She is diabetic her last A1c she thinks was 6.5.  She states that the left ankle is doing better.  She only has intermittent discomfort but overall much improved. Denies any systemic complaints such as fevers, chills, nausea, vomiting. No acute changes since last appointment, and no other complaints at this time.   Objective: AAO x3, NAD DP/PT pulses palpable bilaterally There is mild erythema to all the digits today but there is no increase in warmth.  There is a slight delayed capillary refill time to the digits but this can also be because she is in the seated position with her legs elevated.  There is no area of tenderness identified to bilateral lower extremities.  There is no pain in the left ankle today. No open lesions or pre-ulcerative lesions.  No pain with calf compression, swelling, warmth, erythema  Assessment: Erythema digits bilateral feet.  Plan: -All treatment options discussed with the patient including all alternatives, risks, complications.  -She is concerned about the circulation to her feet.  She does have palpable pulses but given the slight delayed capillary refill time as well as erythema to all the digits.  In order ABI with a TBI to start.  She has no other claudication symptoms we discussed these today and to monitor for the need to call the office. -I will see her back after arterial studies or sooner if needed -Patient encouraged to call the office with any questions, concerns, change in symptoms.   Trula Slade DPM

## 2018-05-11 NOTE — Telephone Encounter (Signed)
[facility] [place] ABI WITH TBI [id number], AUTHORIZATION NUMBER : [id number], VALID 05/11/2018 END DATE 06/10/2018.

## 2018-05-21 ENCOUNTER — Encounter (HOSPITAL_COMMUNITY): Payer: Medicaid Other

## 2018-05-26 ENCOUNTER — Ambulatory Visit: Payer: Medicaid Other | Admitting: Pediatrics

## 2018-05-26 ENCOUNTER — Other Ambulatory Visit: Payer: Self-pay | Admitting: Pediatrics

## 2018-05-26 ENCOUNTER — Encounter: Payer: Self-pay | Admitting: Pediatrics

## 2018-05-26 VITALS — BP 115/79 | HR 93 | Temp 98.3°F | Ht 63.0 in | Wt 349.8 lb

## 2018-05-26 DIAGNOSIS — F17200 Nicotine dependence, unspecified, uncomplicated: Secondary | ICD-10-CM | POA: Diagnosis not present

## 2018-05-26 DIAGNOSIS — E119 Type 2 diabetes mellitus without complications: Secondary | ICD-10-CM | POA: Diagnosis not present

## 2018-05-26 DIAGNOSIS — I10 Essential (primary) hypertension: Secondary | ICD-10-CM

## 2018-05-26 LAB — BAYER DCA HB A1C WAIVED: HB A1C: 6.2 % (ref <7.0)

## 2018-05-26 NOTE — Progress Notes (Signed)
  Subjective:   Patient ID: Heidi Landry, female    DOB: 1986-09-24, 32 y.o.   MRN: [id number] CC: Diabetes (3 month follow up) and Hypertension  HPI: Heidi Landry is a 32 y.o. female   Elevated BMI: Does not have a babysitter, has not been able to attend nutrition appointment yet.  She says her husband tells her her portion sizes are too big.  She is trying to cut back on sodas, sugar in her coffee.  She eats 3 meals a day.  She does sometimes get seconds.  Hypertension: Taking medicines regularly.  No lightheadedness, shortness of breath, chest pain.  Regular exercise is been hard with her toddler son.  Tobacco use: Trying to cut back.  Diabetes: Working towards dietary changes as above.  Continues to take metformin regularly.  Relevant past medical, surgical, family and social history reviewed. Allergies and medications reviewed and updated. Social History   Tobacco Use  Smoking Status Current Every Day Smoker  . Packs/day: 1.00  . Years: 9.00  . Pack years: 9.00  . Types: Cigarettes  . Start date: 05/25/2006  Smokeless Tobacco Never Used   ROS: Per HPI   Objective:    BP 115/79   Pulse 93   Temp 98.3 F (36.8 C) (Oral)   Ht 5' 3"  (1.6 m)   Wt (!) 349 lb 12.8 oz (158.7 kg)   BMI 61.96 kg/m   Wt Readings from Last 3 Encounters:  05/26/18 (!) 349 lb 12.8 oz (158.7 kg)  02/25/18 (!) 350 lb 9.6 oz (159 kg)  02/03/18 (!) 348 lb (157.9 kg)    Gen: NAD, alert, cooperative with exam, NCAT EYES: EOMI, no conjunctival injection, or no icterus ENT:  TMs pearly gray b/l, OP without erythema LYMPH: no cervical LAD CV: NRRR, normal S1/S2, no murmur, distal pulses 2+ b/l Resp: CTABL, no wheezes, normal WOB Abd: +BS, soft, NTND. n Ext: No edema, warm Neuro: Alert and oriented  Assessment & Plan:  Heidi Landry was seen today for diabetes and hypertension.  Diagnoses and all orders for this visit:  Type 2 diabetes mellitus without complication, without long-term current use  of insulin (HCC) A1c 6.2.  Continue metformin.  Continue cutting back on sugar intake.  Patient will try to schedule nutrition appointment when she is able to. -     Microalbumin / creatinine urine ratio -     Bayer DCA Hb A1c Waived  Smoker Continue to encourage cessation.  Morbid obesity ([place]) Dietary changes as above, encouraged lifestyle changes.  Essential hypertension Stable, continue current meds  Follow up plan: Return in about 3 months (around 08/26/2018). Assunta Found, MD [facility]

## 2018-05-28 ENCOUNTER — Inpatient Hospital Stay (HOSPITAL_COMMUNITY): Admission: RE | Admit: 2018-05-28 | Payer: Medicaid Other | Source: Ambulatory Visit

## 2018-05-28 ENCOUNTER — Ambulatory Visit (HOSPITAL_COMMUNITY)
Admission: RE | Admit: 2018-05-28 | Discharge: 2018-05-28 | Disposition: A | Discharge status: Home / Self Care | Payer: Medicaid Other | Source: Ambulatory Visit | Attending: Cardiology | Admitting: Cardiology

## 2018-05-28 DIAGNOSIS — I739 Peripheral vascular disease, unspecified: Secondary | ICD-10-CM | POA: Insufficient documentation

## 2018-05-28 DIAGNOSIS — L539 Erythematous condition, unspecified: Secondary | ICD-10-CM | POA: Insufficient documentation

## 2018-06-01 ENCOUNTER — Telehealth: Payer: Self-pay | Admitting: *Deleted

## 2018-06-01 DIAGNOSIS — R202 Paresthesia of skin: Secondary | ICD-10-CM

## 2018-06-01 NOTE — Telephone Encounter (Signed)
Left message for pt to call for results  

## 2018-06-01 NOTE — Telephone Encounter (Signed)
-----   Message from Trula Slade, DPM sent at 05/31/2018  8:13 AM EDT ----- Val- please let her know that the circulation test was normal for the most part. There is mild decrease in some of the toes themselves but overall she has good circulation. We will continue to monitor this.

## 2018-06-02 ENCOUNTER — Telehealth: Payer: Self-pay | Admitting: *Deleted

## 2018-06-02 NOTE — Telephone Encounter (Signed)
I informed pt of Dr. Leigh Aurora review of results. Pt asked if Dr. Jacqualyn Posey wanted her to have the nerve test for neuropathy. I told her I would call with further instructions.

## 2018-06-02 NOTE — Telephone Encounter (Signed)
I informed pt of Dr. Leigh Aurora review of circulation results.

## 2018-06-09 NOTE — Telephone Encounter (Signed)
Left message requesting callback with information on possible NCV at [facility], if not performed there, we would order.

## 2018-06-09 NOTE — Telephone Encounter (Signed)
Val- can you ask her if she is getting a lot of numbness, tingling, burning to her feet? If so we can order it. I see that she has seen [facility] for lumbar issues. Did they ever do a NCV? Thanks.

## 2018-06-10 NOTE — Telephone Encounter (Signed)
Faxed orders for NCV to [facility].

## 2018-06-10 NOTE — Addendum Note (Signed)
Addended by: Harriett Sine D on: 06/10/2018 01:38 PM   Modules accepted: Orders

## 2018-06-13 ENCOUNTER — Other Ambulatory Visit: Payer: Self-pay | Admitting: Pediatrics

## 2018-06-13 DIAGNOSIS — M545 Low back pain, unspecified: Secondary | ICD-10-CM

## 2018-06-21 ENCOUNTER — Encounter: Payer: Self-pay | Admitting: Neurology

## 2018-06-25 ENCOUNTER — Other Ambulatory Visit: Payer: Self-pay | Admitting: Pediatrics

## 2018-06-25 DIAGNOSIS — F411 Generalized anxiety disorder: Secondary | ICD-10-CM

## 2018-06-28 NOTE — Telephone Encounter (Signed)
Ov 08/26/18

## 2018-07-13 ENCOUNTER — Other Ambulatory Visit: Payer: Self-pay | Admitting: Pediatrics

## 2018-07-13 DIAGNOSIS — M545 Low back pain, unspecified: Secondary | ICD-10-CM

## 2018-07-14 NOTE — Telephone Encounter (Signed)
Last seen 5/19  Dr Evette Doffing

## 2018-07-15 ENCOUNTER — Other Ambulatory Visit: Payer: Self-pay | Admitting: Pediatrics

## 2018-07-15 DIAGNOSIS — M545 Low back pain, unspecified: Secondary | ICD-10-CM

## 2018-07-16 NOTE — Telephone Encounter (Signed)
Last seen 05/26/18  Dr Evette Doffing

## 2018-07-31 ENCOUNTER — Other Ambulatory Visit: Payer: Self-pay | Admitting: Pediatrics

## 2018-07-31 DIAGNOSIS — F411 Generalized anxiety disorder: Secondary | ICD-10-CM

## 2018-07-31 DIAGNOSIS — I10 Essential (primary) hypertension: Secondary | ICD-10-CM

## 2018-08-09 ENCOUNTER — Encounter: Payer: Self-pay | Admitting: Pediatrics

## 2018-08-12 ENCOUNTER — Ambulatory Visit (INDEPENDENT_AMBULATORY_CARE_PROVIDER_SITE_OTHER): Payer: Medicaid Other | Admitting: Pediatrics

## 2018-08-12 ENCOUNTER — Encounter: Payer: Self-pay | Admitting: Pediatrics

## 2018-08-12 VITALS — BP 130/89 | HR 93 | Temp 99.3°F | Resp 22 | Ht 63.0 in | Wt 358.0 lb

## 2018-08-12 DIAGNOSIS — R4589 Other symptoms and signs involving emotional state: Secondary | ICD-10-CM

## 2018-08-12 DIAGNOSIS — F329 Major depressive disorder, single episode, unspecified: Secondary | ICD-10-CM

## 2018-08-12 DIAGNOSIS — N926 Irregular menstruation, unspecified: Secondary | ICD-10-CM

## 2018-08-12 DIAGNOSIS — Z720 Tobacco use: Secondary | ICD-10-CM | POA: Diagnosis not present

## 2018-08-12 DIAGNOSIS — Z6841 Body Mass Index (BMI) 40.0 and over, adult: Secondary | ICD-10-CM

## 2018-08-12 DIAGNOSIS — N898 Other specified noninflammatory disorders of vagina: Secondary | ICD-10-CM | POA: Diagnosis not present

## 2018-08-12 LAB — WET PREP FOR TRICH, YEAST, CLUE
Clue Cell Exam: NEGATIVE
TRICHOMONAS EXAM: NEGATIVE

## 2018-08-12 MED ORDER — BUPROPION HCL ER (SR) 150 MG PO TB12: ORAL_TABLET | ORAL | 2 refills | Status: DC

## 2018-08-12 NOTE — Progress Notes (Signed)
Subjective:   Patient ID: Heidi Landry, female    DOB: Jul 19, 1986, 32 y.o.   MRN: [id number] CC: Excessive Sweating; Fatigue; Abnormal Menstrual Cycle [facility] Discharge); and Headache  HPI: Heidi Landry is a 32 y.o. female   LMP: 3 weeks ago Lasts for 5 days usually, changing pads every 2-3 hours usually. LMP was a couple days late, brown discharge the whole time, not regular bleeding.  On progesterone oral pill for birth control. Took two home preg tests, both negative, most recent 3 days ago. Past week has been sweating more. Feels hot all the time, worse when she is outside. Has to rest more often. No chest pain. No periods of inactivity, has a 33yo at home.   Mood: has been down, worries about her husband interactions, son. Over eats when she is feeling down.   Elevated BMI: wt up 9 lbs since last visit. Drinking sodas regularly  Tobacco use: pt asks about medications to help her quit. Smoking outside only now.  Vaginal irritation past weel. No discharge.  Relevant past medical, surgical, family and social history reviewed. Allergies and medications reviewed and updated. Social History   Tobacco Use  Smoking Status Current Every Day Smoker  . Packs/day: 1.00  . Years: 9.00  . Pack years: 9.00  . Types: Cigarettes  . Start date: 05/25/2006  Smokeless Tobacco Never Used   ROS: Per HPI   Objective:    BP 130/89   Pulse 93   Temp 99.3 F (37.4 C) (Oral)   Resp (!) 22   Ht _0  (1.6 m)   Wt (!) 358 lb (162.4 kg)   SpO2 97%   BMI 63.42 kg/m   Wt Readings from Last 3 Encounters:  08/12/18 (!) 358 lb (162.4 kg)  05/26/18 (!) 349 lb 12.8 oz (158.7 kg)  02/25/18 (!) 350 lb 9.6 oz (159 kg)    Gen: NAD, alert, cooperative with exam, NCAT EYES: EOMI, no conjunctival injection, or no icterus ENT:  OP without erythema LYMPH: no cervical LAD CV: NRRR, normal S1/S2, no murmur, distal pulses 2+ b/l Resp: CTABL, no wheezes, normal WOB Abd: +BS, soft, NTND. no guarding or  organomegaly Ext: No edema, warm Neuro: Alert and oriented, strength equal b/l UE and LE, coordination grossly normal MSK: normal muscle bulk  Assessment & Plan:  Cristiana was seen today for excessive sweating, fatigue, abnormal menstrual cycle and headache.  Diagnoses and all orders for this visit:  Irregular periods Discussed taking birth control every day same time, using condoms for back up birth control -     Beta hCG quant (ref lab) -     CBC with Differential/Platelet -     CMP14+EGFR -     TSH  Vaginal irritation -     WET PREP FOR TRICH, YEAST, CLUE  Tobacco use Start below. Any worsening symptoms let me know. -     buPROPion (WELLBUTRIN SR) 150 MG 12 hr tablet; Take one tab once daily for first three days, then one tab twice daily  Depressed mood Overeats when mood is down, cont sertraline. Again encouraged counseling. Pt open to virtual behavioral health program. Will put in referral.   BMI 60.0-69.9, adult ([place]) hasnt been able to get into see nutritionist because she has not been able to find child care. Cont to encourage mothers day out programs, part-time preschools for her 32yo. Stop sodas.   Follow up plan: Return in about 2 weeks (around 08/26/2018). Assunta Found, MD [facility]

## 2018-08-13 ENCOUNTER — Encounter: Payer: Self-pay | Admitting: Pediatrics

## 2018-08-13 ENCOUNTER — Telehealth: Payer: Self-pay

## 2018-08-13 ENCOUNTER — Other Ambulatory Visit: Payer: Self-pay | Admitting: Pediatrics

## 2018-08-13 DIAGNOSIS — B399 Histoplasmosis, unspecified: Secondary | ICD-10-CM | POA: Diagnosis not present

## 2018-08-13 DIAGNOSIS — R7401 Elevation of levels of liver transaminase levels: Secondary | ICD-10-CM

## 2018-08-13 DIAGNOSIS — H35051 Retinal neovascularization, unspecified, right eye: Secondary | ICD-10-CM | POA: Diagnosis not present

## 2018-08-13 DIAGNOSIS — R740 Nonspecific elevation of levels of transaminase and lactic acid dehydrogenase [LDH]: Principal | ICD-10-CM

## 2018-08-13 DIAGNOSIS — H2513 Age-related nuclear cataract, bilateral: Secondary | ICD-10-CM | POA: Diagnosis not present

## 2018-08-13 DIAGNOSIS — H31091 Other chorioretinal scars, right eye: Secondary | ICD-10-CM | POA: Diagnosis not present

## 2018-08-13 LAB — CBC WITH DIFFERENTIAL/PLATELET
BASOS: 0 %
Basophils Absolute: 0 10*3/uL (ref 0.0–0.2)
EOS (ABSOLUTE): 0.2 10*3/uL (ref 0.0–0.4)
EOS: 2 %
HEMATOCRIT: 44.3 % (ref 34.0–46.6)
Hemoglobin: 14.6 g/dL (ref 11.1–15.9)
IMMATURE GRANS (ABS): 0.1 10*3/uL (ref 0.0–0.1)
IMMATURE GRANULOCYTES: 1 %
LYMPHS: 21 %
Lymphocytes Absolute: 2.4 10*3/uL (ref 0.7–3.1)
MCH: 28.2 pg (ref 26.6–33.0)
MCHC: 33 g/dL (ref 31.5–35.7)
MCV: 86 fL (ref 79–97)
Monocytes Absolute: 0.4 10*3/uL (ref 0.1–0.9)
Monocytes: 3 %
Neutrophils Absolute: 8.6 10*3/uL — ABNORMAL HIGH (ref 1.4–7.0)
Neutrophils: 73 %
PLATELETS: 185 10*3/uL (ref 150–450)
RBC: 5.18 x10E6/uL (ref 3.77–5.28)
RDW: 14.8 % (ref 12.3–15.4)
WBC: 11.7 10*3/uL — ABNORMAL HIGH (ref 3.4–10.8)

## 2018-08-13 LAB — CMP14+EGFR
A/G RATIO: 1.5 (ref 1.2–2.2)
ALT: 93 IU/L — AB (ref 0–32)
AST: 80 IU/L — ABNORMAL HIGH (ref 0–40)
Albumin: 4.5 g/dL (ref 3.5–5.5)
Alkaline Phosphatase: 98 IU/L (ref 39–117)
BUN/Creatinine Ratio: 22 (ref 9–23)
BUN: 13 mg/dL (ref 6–20)
Bilirubin Total: 0.5 mg/dL (ref 0.0–1.2)
CALCIUM: 9.5 mg/dL (ref 8.7–10.2)
CO2: 26 mmol/L (ref 20–29)
CREATININE: 0.6 mg/dL (ref 0.57–1.00)
Chloride: 97 mmol/L (ref 96–106)
GFR, EST AFRICAN AMERICAN: 139 mL/min/{1.73_m2} (ref >59)
GFR, EST NON AFRICAN AMERICAN: 120 mL/min/{1.73_m2} (ref >59)
Globulin, Total: 3 g/dL (ref 1.5–4.5)
Glucose: 200 mg/dL — ABNORMAL HIGH (ref 65–99)
POTASSIUM: 4.3 mmol/L (ref 3.5–5.2)
Sodium: 140 mmol/L (ref 134–144)
TOTAL PROTEIN: 7.5 g/dL (ref 6.0–8.5)

## 2018-08-13 LAB — BETA HCG QUANT (REF LAB): hCG Quant: <1 m[IU]/mL

## 2018-08-13 LAB — TSH: TSH: 1.77 u[IU]/mL (ref 0.450–4.500)

## 2018-08-13 NOTE — Telephone Encounter (Signed)
Patient reports that she was tired and not able to speak.  Patient requests that she will call me beak on Monday.morning.

## 2018-08-16 ENCOUNTER — Encounter: Payer: Self-pay | Admitting: Pediatrics

## 2018-08-16 ENCOUNTER — Other Ambulatory Visit: Payer: Self-pay | Admitting: Pediatrics

## 2018-08-16 DIAGNOSIS — I10 Essential (primary) hypertension: Secondary | ICD-10-CM

## 2018-08-23 ENCOUNTER — Other Ambulatory Visit: Payer: Self-pay | Admitting: Pediatrics

## 2018-08-23 DIAGNOSIS — M545 Low back pain, unspecified: Secondary | ICD-10-CM

## 2018-08-23 DIAGNOSIS — E11319 Type 2 diabetes mellitus with unspecified diabetic retinopathy without macular edema: Secondary | ICD-10-CM

## 2018-08-23 NOTE — Telephone Encounter (Signed)
Last seen 08/12/18  Dr Evette Doffing

## 2018-08-25 ENCOUNTER — Ambulatory Visit (HOSPITAL_COMMUNITY)
Admission: RE | Admit: 2018-08-25 | Discharge: 2018-08-25 | Disposition: A | Discharge status: Home / Self Care | Payer: Medicaid Other | Source: Ambulatory Visit | Attending: Pediatrics | Admitting: Pediatrics

## 2018-08-25 DIAGNOSIS — R740 Nonspecific elevation of levels of transaminase and lactic acid dehydrogenase [LDH]: Secondary | ICD-10-CM | POA: Diagnosis present

## 2018-08-25 DIAGNOSIS — Z9049 Acquired absence of other specified parts of digestive tract: Secondary | ICD-10-CM | POA: Insufficient documentation

## 2018-08-25 DIAGNOSIS — R7401 Elevation of levels of liver transaminase levels: Secondary | ICD-10-CM

## 2018-08-26 ENCOUNTER — Ambulatory Visit (INDEPENDENT_AMBULATORY_CARE_PROVIDER_SITE_OTHER): Payer: Medicaid Other | Admitting: Pediatrics

## 2018-08-26 ENCOUNTER — Encounter: Payer: Self-pay | Admitting: Pediatrics

## 2018-08-26 VITALS — BP 130/81 | HR 102 | Temp 98.2°F | Ht 63.0 in | Wt 354.0 lb

## 2018-08-26 DIAGNOSIS — R079 Chest pain, unspecified: Secondary | ICD-10-CM

## 2018-08-26 DIAGNOSIS — R740 Nonspecific elevation of levels of transaminase and lactic acid dehydrogenase [LDH]: Secondary | ICD-10-CM

## 2018-08-26 DIAGNOSIS — K219 Gastro-esophageal reflux disease without esophagitis: Secondary | ICD-10-CM | POA: Diagnosis not present

## 2018-08-26 DIAGNOSIS — F411 Generalized anxiety disorder: Secondary | ICD-10-CM

## 2018-08-26 DIAGNOSIS — I10 Essential (primary) hypertension: Secondary | ICD-10-CM | POA: Diagnosis not present

## 2018-08-26 DIAGNOSIS — N3090 Cystitis, unspecified without hematuria: Secondary | ICD-10-CM | POA: Diagnosis not present

## 2018-08-26 DIAGNOSIS — Z6841 Body Mass Index (BMI) 40.0 and over, adult: Secondary | ICD-10-CM | POA: Diagnosis not present

## 2018-08-26 DIAGNOSIS — R399 Unspecified symptoms and signs involving the genitourinary system: Secondary | ICD-10-CM | POA: Diagnosis not present

## 2018-08-26 DIAGNOSIS — E119 Type 2 diabetes mellitus without complications: Secondary | ICD-10-CM | POA: Diagnosis not present

## 2018-08-26 DIAGNOSIS — R7401 Elevation of levels of liver transaminase levels: Secondary | ICD-10-CM

## 2018-08-26 DIAGNOSIS — K760 Fatty (change of) liver, not elsewhere classified: Secondary | ICD-10-CM

## 2018-08-26 DIAGNOSIS — K589 Irritable bowel syndrome without diarrhea: Secondary | ICD-10-CM | POA: Diagnosis not present

## 2018-08-26 DIAGNOSIS — J3089 Other allergic rhinitis: Secondary | ICD-10-CM

## 2018-08-26 LAB — URINALYSIS, COMPLETE
Bilirubin, UA: NEGATIVE
GLUCOSE, UA: NEGATIVE
Ketones, UA: NEGATIVE
Nitrite, UA: NEGATIVE
PH UA: 7.5 (ref 5.0–7.5)
Specific Gravity, UA: 1.025 (ref 1.005–1.030)
Urobilinogen, Ur: 1 mg/dL (ref 0.2–1.0)

## 2018-08-26 LAB — MICROSCOPIC EXAMINATION
RBC, UA: >30 /hpf — AB (ref 0–2)
RENAL EPITHEL UA: NONE SEEN /HPF

## 2018-08-26 LAB — BAYER DCA HB A1C WAIVED: HB A1C: 7.3 % — AB (ref <7.0)

## 2018-08-26 MED ORDER — SPIRONOLACTONE 100 MG PO TABS: 100.0000 mg | ORAL_TABLET | Freq: Every day | ORAL | 1 refills | Status: DC

## 2018-08-26 MED ORDER — EXENATIDE ER 2 MG ~~LOC~~ PEN: 2.0000 mg | PEN_INJECTOR | SUBCUTANEOUS | 2 refills | Status: DC

## 2018-08-26 MED ORDER — CETIRIZINE HCL 10 MG PO TABS: 10.0000 mg | ORAL_TABLET | Freq: Every day | ORAL | 1 refills | Status: DC

## 2018-08-26 MED ORDER — HYDROCHLOROTHIAZIDE 12.5 MG PO TABS: 12.5000 mg | ORAL_TABLET | Freq: Every day | ORAL | 1 refills | Status: DC

## 2018-08-26 MED ORDER — DICYCLOMINE HCL 20 MG PO TABS: 20.0000 mg | ORAL_TABLET | Freq: Four times a day (QID) | ORAL | 3 refills | Status: DC | PRN

## 2018-08-26 MED ORDER — OMEPRAZOLE 20 MG PO CPDR: 20.0000 mg | DELAYED_RELEASE_CAPSULE | Freq: Every day | ORAL | 6 refills | Status: DC

## 2018-08-26 MED ORDER — NITROFURANTOIN MONOHYD MACRO 100 MG PO CAPS: 100.0000 mg | ORAL_CAPSULE | Freq: Two times a day (BID) | ORAL | 0 refills | Status: AC

## 2018-08-26 MED ORDER — SERTRALINE HCL 50 MG PO TABS: 150.0000 mg | ORAL_TABLET | Freq: Every day | ORAL | 1 refills | Status: DC

## 2018-08-26 NOTE — Progress Notes (Signed)
Subjective:   Patient ID: Sherilyn Banker, female    DOB: [birth year], 32 y.o.   MRN: [id number] CC: Medical Management of Chronic Issues  HPI: Anhthu Perdew is a 32 y.o. female   UTI symptoms: Ongoing for the last 2 to 3 days.  Having lower abdominal pressure, frequent urination.  Sometimes dysuria.  No fevers.  During sex about a week ago patient had sudden onset severe chest pressure lasted for about 5 to 10 minutes.  She could not breathe while this was happening.  She has never had something like this before.   Elevated BMI: With diabetes, hypertension, arthritis, fatty liver.  She was seen by bariatric surgeon months ago, said the only procedure she would be eligible for she would likely have chronic diarrhea and may need to be admitted to the hospital intermittently for IV nutrition.  This has made her very nervous about going through bariatric surgery though she was interested in it initially.  Finding childcare so she can go to nutrition appointments has been difficult.  She says she knows what to do, eating more fruits and vegetables and decreasing prepackaged foods, she has a difficult time standing for periods of time and cooking however and has a hard time coming up with things to eat for her and her family that would actually be possible for her to make.  Anxiety: Some ongoing symptoms.  Feels overwhelmed with her own medical problems and those of her son who is 45 years old with developmental delay.  She does not have much support around here to help with childcare.  Her husband no longer travels for work and it has been helpful having him around more to help with son.  Relevant past medical, surgical, family and social history reviewed. Allergies and medications reviewed and updated. Social History   Tobacco Use  Smoking Status Current Every Day Smoker  . Packs/day: 1.00  . Years: 9.00  . Pack years: 9.00  . Types: Cigarettes  . Start date: 05/25/2006  Smokeless Tobacco Never  Used   ROS: Per HPI   Objective:    BP 130/81   Pulse (!) 102   Temp 98.2 F (36.8 C) (Oral)   Ht 5' 3"  (1.6 m)   Wt (!) 354 lb (160.6 kg)   BMI 62.71 kg/m   Wt Readings from Last 3 Encounters:  08/26/18 (!) 354 lb (160.6 kg)  08/12/18 (!) 358 lb (162.4 kg)  05/26/18 (!) 349 lb 12.8 oz (158.7 kg)    Gen: NAD, alert, cooperative with exam, NCAT EYES: EOMI, no conjunctival injection, or no icterus CV: NRRR, normal S1/S2, no murmur, distal pulses 2+ b/l Resp: CTABL, no wheezes, normal WOB Abd: +BS, soft, NTND.  Ext: No edema, warm Neuro: Alert and oriented, strength equal b/l UE and LE, coordination grossly normal MSK: normal muscle bulk Psych: tearful at times  Assessment & Plan:  Liba was seen today for medical management of chronic issues.  Diagnoses and all orders for this visit:  Type 2 diabetes mellitus without complication, without long-term current use of insulin (HCC) A1c 7.3. Continue metformin. Stop sodas and prepackaged snacks/chips/cookies. F/u with nutritionist as below. Cont metformin. Start exenatide, first dose given in clinic today. -     Bayer DCA Hb A1c Waived -     Hepatic Function Panel -     Exenatide ER (BYDUREON) 2 MG PEN; Inject 2 mg into the skin once a week.  UTI symptoms UA positive, treat cystitis as below -  Urinalysis, Complete -     Urine Culture; Future  Essential hypertension Stable, slightly elevated, cont below -     spironolactone (ALDACTONE) 100 MG tablet; Take 1 tablet (100 mg total) by mouth daily. -     hydrochlorothiazide (HYDRODIURIL) 12.5 MG tablet; Take 1 tablet (12.5 mg total) by mouth daily.  Generalized anxiety disorder Mood has been up and down. Support given. Continue below. Let me know if any worsening. -     sertraline (ZOLOFT) 50 MG tablet; Take 3 tablets (150 mg total) by mouth daily.  Gastroesophageal reflux disease, esophagitis presence not specified -     omeprazole (PRILOSEC) 20 MG capsule; Take 1  capsule (20 mg total) by mouth daily. 1 tablet a day  Irritable bowel syndrome, unspecified type -     dicyclomine (BENTYL) 20 MG tablet; Take 1 tablet (20 mg total) by mouth 4 (four) times daily as needed for spasms.  Allergic rhinitis due to other allergic trigger, unspecified seasonality -     cetirizine (ZYRTEC) 10 MG tablet; Take 1 tablet (10 mg total) by mouth daily.  Cystitis -     nitrofurantoin, macrocrystal-monohydrate, (MACROBID) 100 MG capsule; Take 1 capsule (100 mg total) by mouth 2 (two) times daily for 5 days.  Transaminitis -     Ambulatory referral to Gastroenterology  Chest pain, unspecified type Episode of severe chest pressure during exertion. Multiple risk factors include DM2, HTN, tobacco use. -     Ambulatory referral to Cardiology  Hepatic steatosis BMI 60.0-69.9, adult ([place]) Discussed importance of weight loss. Pt limited with mobility, cooking appropriate meals has been hard. Discussed how nutrition could help. Pt to call to set up appt with nutritionist, she has phone number. Son also needs nutritionist, was going to get through the [facility]. She declined their visit because she is feeling overwhelmed with appts he needs for developmental delay f/u. Increase physical activity to 15-20 minutes daily, can be sitting or standing.    I spent 25 minutes with the patient with over 50% of the encounter time dedicated to counseling on the above problems.   Follow up plan: Return in about 6 weeks (around 10/07/2018). Assunta Found, MD [place]

## 2018-08-26 NOTE — Addendum Note (Signed)
Addended by: Earlene Plater on: 08/26/2018 12:53 PM   Modules accepted: Orders

## 2018-08-26 NOTE — Patient Instructions (Signed)
Call to set up appt with nutritionist

## 2018-08-27 LAB — HEPATIC FUNCTION PANEL
ALBUMIN: 4.5 g/dL (ref 3.5–5.5)
ALT: 77 IU/L — AB (ref 0–32)
AST: 71 IU/L — ABNORMAL HIGH (ref 0–40)
Alkaline Phosphatase: 114 IU/L (ref 39–117)
BILIRUBIN TOTAL: 0.8 mg/dL (ref 0.0–1.2)
Bilirubin, Direct: 0.18 mg/dL (ref 0.00–0.40)
Total Protein: 7.4 g/dL (ref 6.0–8.5)

## 2018-08-28 LAB — URINE CULTURE

## 2018-09-01 NOTE — Progress Notes (Signed)
Placed at her OV

## 2018-09-06 ENCOUNTER — Encounter: Payer: Self-pay | Admitting: Internal Medicine

## 2018-09-07 ENCOUNTER — Telehealth: Payer: Self-pay

## 2018-09-07 NOTE — Telephone Encounter (Signed)
[facility] - Left Msg on 08-27-18 and 09-07-2018

## 2018-09-13 ENCOUNTER — Telehealth: Payer: Self-pay

## 2018-09-13 DIAGNOSIS — F329 Major depressive disorder, single episode, unspecified: Principal | ICD-10-CM

## 2018-09-13 DIAGNOSIS — F411 Generalized anxiety disorder: Secondary | ICD-10-CM

## 2018-09-13 DIAGNOSIS — R4589 Other symptoms and signs involving emotional state: Secondary | ICD-10-CM
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
## 2018-09-24 ENCOUNTER — Ambulatory Visit: Payer: Self-pay | Admitting: Neurology

## 2018-09-28 ENCOUNTER — Telehealth: Payer: Self-pay

## 2018-09-28 NOTE — Telephone Encounter (Signed)
Patient reports that she was making dinner for her family and was not able to talk.

## 2018-09-29 ENCOUNTER — Ambulatory Visit: Payer: Self-pay | Admitting: Student

## 2018-10-01 DIAGNOSIS — M9905 Segmental and somatic dysfunction of pelvic region: Secondary | ICD-10-CM | POA: Diagnosis not present

## 2018-10-01 DIAGNOSIS — M9902 Segmental and somatic dysfunction of thoracic region: Secondary | ICD-10-CM | POA: Diagnosis not present

## 2018-10-01 DIAGNOSIS — M5441 Lumbago with sciatica, right side: Secondary | ICD-10-CM | POA: Diagnosis not present

## 2018-10-01 DIAGNOSIS — M9903 Segmental and somatic dysfunction of lumbar region: Secondary | ICD-10-CM | POA: Diagnosis not present

## 2018-10-01 DIAGNOSIS — M546 Pain in thoracic spine: Secondary | ICD-10-CM | POA: Diagnosis not present

## 2018-10-01 DIAGNOSIS — M9901 Segmental and somatic dysfunction of cervical region: Secondary | ICD-10-CM | POA: Diagnosis not present

## 2018-10-04 ENCOUNTER — Ambulatory Visit: Payer: Medicaid Other | Admitting: Neurology

## 2018-10-04 ENCOUNTER — Other Ambulatory Visit: Payer: Self-pay | Admitting: Neurology

## 2018-10-04 DIAGNOSIS — R202 Paresthesia of skin: Secondary | ICD-10-CM

## 2018-10-06 ENCOUNTER — Ambulatory Visit (INDEPENDENT_AMBULATORY_CARE_PROVIDER_SITE_OTHER): Payer: Medicaid Other | Admitting: Pediatrics

## 2018-10-06 ENCOUNTER — Encounter: Payer: Self-pay | Admitting: Pediatrics

## 2018-10-06 VITALS — BP 122/76 | HR 91 | Temp 98.4°F | Ht 63.0 in | Wt 333.6 lb

## 2018-10-06 DIAGNOSIS — I10 Essential (primary) hypertension: Secondary | ICD-10-CM

## 2018-10-06 DIAGNOSIS — E119 Type 2 diabetes mellitus without complications: Secondary | ICD-10-CM | POA: Diagnosis not present

## 2018-10-06 DIAGNOSIS — F329 Major depressive disorder, single episode, unspecified: Secondary | ICD-10-CM | POA: Diagnosis not present

## 2018-10-06 DIAGNOSIS — Z720 Tobacco use: Secondary | ICD-10-CM

## 2018-10-06 DIAGNOSIS — R4589 Other symptoms and signs involving emotional state: Secondary | ICD-10-CM

## 2018-10-06 DIAGNOSIS — Z6841 Body Mass Index (BMI) 40.0 and over, adult: Secondary | ICD-10-CM

## 2018-10-06 NOTE — Progress Notes (Signed)
  Subjective:   Patient ID: Heidi Landry, female    DOB: 01-16-1986, 32 y.o.   MRN: [id number] CC: Medical Management of Chronic Issues  HPI: Heidi Landry is a 32 y.o. female   Tobacco use: Trying to cut back on a number per day..  Some days Wellbutrin seems more than others.  Depressed mood: Has been better over the last few weeks.  She thinks the Wellbutrin has been helping some.  Elevated BMI: Has changed her diet over the last few weeks, cut out sugary drinks other than occasional Gatorade, not drinking sodas now.  Eating more fruits and vegetables.  Still having make multiple meals at times per family state do not always eat the vegetables that she is having.  Drinking a lot more water.  Walking up to 40 minutes most days.  Diabetes: Started on exenatide once a week last visit.  She thinks it has been helping some with her weight.  Sometimes she gets a headache after the injection, notices headaches more throughout the week between injections.  Not taking anything for the headache right now.  Hypertension: Taking medicines regularly.  Will occasionally feel her heart fluttering.  Takes metoprolol daily.  Cardiology.  Happening every few days when it does happen.  Relevant past medical, surgical, family and social history reviewed. Allergies and medications reviewed and updated. Social History   Tobacco Use  Smoking Status Current Every Day Smoker  . Packs/day: 1.00  . Years: 9.00  . Pack years: 9.00  . Types: Cigarettes  . Start date: 05/25/2006  Smokeless Tobacco Never Used   ROS: Per HPI   Objective:    BP 122/76   Pulse 91   Temp 98.4 F (36.9 C) (Oral)   Ht 5' 3"  (1.6 m)   Wt (!) 333 lb 9.6 oz (151.3 kg)   BMI 59.09 kg/m   Wt Readings from Last 3 Encounters:  10/06/18 (!) 333 lb 9.6 oz (151.3 kg)  08/26/18 (!) 354 lb (160.6 kg)  08/12/18 (!) 358 lb (162.4 kg)    Gen: NAD, alert, cooperative with exam, NCAT EYES: EOMI, no conjunctival injection, or no  icterus ENT:   OP without erythema LYMPH: no cervical LAD CV: NRRR, normal S1/S2, no murmur, distal pulses 2+ b/l Resp: CTABL, no wheezes, normal WOB Abd: +BS, soft, NTND. no guarding or organomegaly Ext: No edema, warm Neuro: Alert and oriented MSK: normal muscle bulk  Assessment & Plan:  Emi was seen today for medical management of chronic issues.  Diagnoses and all orders for this visit:  Essential hypertension Stable, continue current medicines  Type 2 diabetes mellitus without complication, without long-term current use of insulin (HCC) Some headaches will start on exam is high.  Okay to try Tylenol.  Let me know if any worsening.  Morbid obesity with BMI of 50.0-59.9, adult (HCC) Weight down 20 pounds with diet changes started exenatide over the last few weeks.  Patient very motivated, planning to continue lifestyle changes.  Depressed mood Improved symptoms on Wellbutrin, continue.  Tobacco use Continue Wellbutrin, cessation efforts.  Follow up plan: Return in about 2 months (around 12/06/2018). Assunta Found, MD [place]

## 2018-10-08 ENCOUNTER — Ambulatory Visit: Payer: Medicaid Other | Admitting: Pediatrics

## 2018-10-08 DIAGNOSIS — H35051 Retinal neovascularization, unspecified, right eye: Secondary | ICD-10-CM | POA: Diagnosis not present

## 2018-10-08 DIAGNOSIS — B399 Histoplasmosis, unspecified: Secondary | ICD-10-CM | POA: Diagnosis not present

## 2018-10-08 DIAGNOSIS — H31091 Other chorioretinal scars, right eye: Secondary | ICD-10-CM | POA: Diagnosis not present

## 2018-10-08 DIAGNOSIS — H2513 Age-related nuclear cataract, bilateral: Secondary | ICD-10-CM | POA: Diagnosis not present

## 2018-10-14 ENCOUNTER — Encounter: Payer: Self-pay | Admitting: Pediatrics

## 2018-10-14 DIAGNOSIS — K219 Gastro-esophageal reflux disease without esophagitis: Secondary | ICD-10-CM

## 2018-10-14 MED ORDER — OMEPRAZOLE 40 MG PO CPDR: 40.0000 mg | DELAYED_RELEASE_CAPSULE | Freq: Every day | ORAL | 1 refills | Status: DC

## 2018-10-15 DIAGNOSIS — G4733 Obstructive sleep apnea (adult) (pediatric): Secondary | ICD-10-CM | POA: Diagnosis not present

## 2018-10-18 ENCOUNTER — Telehealth: Payer: Self-pay

## 2018-10-18 NOTE — Telephone Encounter (Signed)
Left Msg

## 2018-10-27 DIAGNOSIS — G4733 Obstructive sleep apnea (adult) (pediatric): Secondary | ICD-10-CM | POA: Insufficient documentation

## 2018-10-27 DIAGNOSIS — R002 Palpitations: Secondary | ICD-10-CM | POA: Insufficient documentation

## 2018-10-27 NOTE — Progress Notes (Signed)
Cardiology Office Note    Date:  11/01/2018   ID:  Heidi Landry, DOB 02/16/1986, MRN [id number]  PCP:  Eustaquio Maize, MD  Cardiologist: Carlyle Dolly, MD EPS: None  Chief Complaint  Patient presents with  . Palpitations    History of Present Illness:  Heidi Landry is a 32 y.o. female with history of palpitations, previous Holter monitor revealed no arrhythmias, treated with beta-blockers and decrease caffeine.  Also has hypertension, OSA, on CPAP, tobacco abuse and obesity.  2D echo in 2017 normal LV function.  Last seen in our office 02/2017 with atypical chest pain felt to be musculoskeletal.  No ischemic work-up done.  Patient was complaining of palpitations to her PCP 10/06/2018. Was diagnosed with fatty liver-had been feeling bad in general. Now eating better and walking and feeling better. Has lost 40 lbs in 2 months. About 2 1/2 months ago was laying in bed and had heaviness in her chest lasted 3-4 min relieved with sitting up and deep breathing. Has GERD.Twice a week has palpitations that take her breath away but only last a few seconds. Quit caffeine. Having sharp right sided chest pain as well. Also having back and nerve pain and is seeing a chiropractor.No exertional chest pain or palpitations. Usual occurs when sitting or laying down in house or doing house work. No family history of CAD. Smokes 1/2 ppd-cutting down.   Past Medical History:  Diagnosis Date  . Anxiety   . Atrial fibrillation ([place])   . Carpal tunnel syndrome, bilateral   . Chest pain   . Chronic back pain   . Degenerative disc disease   . Edema   . Gestational diabetes mellitus, antepartum   . Hypertension   . IBS (irritable bowel syndrome)   . Panic attacks   . Post partum depression   . Sleep apnea   . Spondylosis   . Tendonitis     Past Surgical History:  Procedure Laterality Date  . CESAREAN SECTION N/A 12/08/2015   Procedure: CESAREAN SECTION;  Surgeon: Jonnie Kind, MD;   Location: [facility];  Service: Obstetrics;  Laterality: N/A;  . CHOLECYSTECTOMY      Current Medications: Current Meds  Medication Sig  . ACCU-CHEK AVIVA PLUS test strip USE TO CHECK BLOOD GLUCOSE ONCE DAILY OR EVERY OTHER DAY  . ACCU-CHEK FASTCLIX LANCETS MISC 1 each daily by Does not apply route. Use to check BG four times daily  . albuterol (PROVENTIL HFA;VENTOLIN HFA) 108 (90 Base) MCG/ACT inhaler Inhale 2 puffs into the lungs every 6 (six) hours as needed for wheezing or shortness of breath.  Marland Kitchen buPROPion (WELLBUTRIN SR) 150 MG 12 hr tablet Take one tab once daily for first three days, then one tab twice daily  . cetirizine (ZYRTEC) 10 MG tablet Take 1 tablet (10 mg total) by mouth daily.  . cyclobenzaprine (FLEXERIL) 10 MG tablet TAKE 1 TABLET BY MOUTH THREE TIMES DAILY AS NEEDED FOR MUSCLE SPASM  . dicyclomine (BENTYL) 20 MG tablet Take 1 tablet (20 mg total) by mouth 4 (four) times daily as needed for spasms.  . Exenatide ER (BYDUREON) 2 MG PEN Inject 2 mg into the skin once a week.  . fluticasone (FLONASE) 50 MCG/ACT nasal spray Place 1 spray into both nostrils daily.  . hydrochlorothiazide (HYDRODIURIL) 12.5 MG tablet Take 1 tablet (12.5 mg total) by mouth daily.  . metFORMIN (GLUCOPHAGE-XR) 500 MG 24 hr tablet TAKE 2 TABLETS BY MOUTH TWICE DAILY  . metoprolol succinate (TOPROL-XL)  25 MG 24 hr tablet TAKE 1 TABLET BY MOUTH ONCE DAILY WITH  OR  IMMEDIATELY  FOLLOWING  A  MEAL  . norethindrone (ORTHO MICRONOR) 0.35 MG tablet Take 1 tablet (0.35 mg total) by mouth daily.  Marland Kitchen omeprazole (PRILOSEC) 40 MG capsule Take 1 capsule (40 mg total) by mouth daily. 1 tablet a day  . podofilox (CONDYLOX) 0.5 % gel Apply topically 2 (two) times daily.  . sertraline (ZOLOFT) 50 MG tablet Take 3 tablets (150 mg total) by mouth daily.  Marland Kitchen spironolactone (ALDACTONE) 100 MG tablet Take 1 tablet (100 mg total) by mouth daily.     Allergies:   Patient has no known allergies.   Social History    Socioeconomic History  . Marital status: Married    Spouse name: Not on file  . Number of children: Not on file  . Years of education: 45  . Highest education level: Not on file  Occupational History    Employer: [employer]  Social Needs  . Financial resource strain: Not on file  . Food insecurity:    Worry: Not on file    Inability: Not on file  . Transportation needs:    Medical: Not on file    Non-medical: Not on file  Tobacco Use  . Smoking status: Current Every Day Smoker    Packs/day: 0.50    Years: 9.00    Pack years: 4.50    Types: Cigarettes    Start date: 05/25/2006  . Smokeless tobacco: Never Used  Substance and Sexual Activity  . Alcohol use: No    Alcohol/week: 0.0 standard drinks  . Drug use: No  . Sexual activity: Yes    Birth control/protection: Pill  Lifestyle  . Physical activity:    Days per week: Not on file    Minutes per session: Not on file  . Stress: Not on file  Relationships  . Social connections:    Talks on phone: Not on file    Gets together: Not on file    Attends religious service: Not on file    Active member of club or organization: Not on file    Attends meetings of clubs or organizations: Not on file    Relationship status: Not on file  Other Topics Concern  . Not on file  Social History Narrative  . Not on file     Family History:  The patient's family history includes Brain cancer in her paternal uncle; Cancer in her father and paternal uncle; Diabetes in her maternal grandmother; Fibromyalgia in her mother; Hernia in her father; Hypertension in her maternal grandmother; Throat cancer in her paternal uncle; Thyroid disease in her paternal aunt.   ROS:   Please see the history of present illness.    Review of Systems  Constitution: Positive for weight loss.  HENT: Negative.   Eyes: Negative.   Cardiovascular: Positive for chest pain, dyspnea on exertion and palpitations.  Respiratory: Negative.    Hematologic/Lymphatic: Negative.   Musculoskeletal: Positive for back pain, joint pain and myalgias.  Gastrointestinal: Negative.   Genitourinary: Negative.   Neurological: Negative.    All other systems reviewed and are negative.   PHYSICAL EXAM:   VS:  BP 130/86   Pulse 93   Ht 5' 2"  (1.575 m)   Wt (!) 328 lb (148.8 kg)   SpO2 97%   BMI 59.99 kg/m   Physical Exam  GEN: Obese, in no acute distress  Neck: no JVD, carotid bruits,  or masses Cardiac:RRR; no murmurs, rubs, or gallops  Respiratory:  clear to auscultation bilaterally, normal work of breathing GI: soft, nontender, nondistended, + BS Ext: without cyanosis, clubbing, or edema, Good distal pulses bilaterally Neuro:  Alert and Oriented x 3, Strength and sensation are intact Psych: euthymic mood, full affect  Wt Readings from Last 3 Encounters:  11/01/18 (!) 328 lb (148.8 kg)  10/06/18 (!) 333 lb 9.6 oz (151.3 kg)  08/26/18 (!) 354 lb (160.6 kg)      Studies/Labs Reviewed:   EKG:  EKG is ordered today.  The ekg ordered today demonstrates normal sinus rhythm with right bundle branch block and RVH similar to last EKG she had here, no acute change  Recent Labs: 08/12/2018: BUN 13; Creatinine, Ser 0.60; Hemoglobin 14.6; Platelets 185; Potassium 4.3; Sodium 140; TSH 1.770 08/26/2018: ALT 77   Lipid Panel    Component Value Date/Time   CHOL 175 02/25/2018 1104   TRIG 244 (H) 02/25/2018 1104   HDL 38 (L) 02/25/2018 1104   CHOLHDL 4.6 (H) 02/25/2018 1104   LDLCALC 88 02/25/2018 1104    Additional studies/ records that were reviewed today include:  Echo 3/2017Study Conclusions   - Left ventricle: The cavity size was normal. Wall thickness was   increased in a pattern of mild LVH. Systolic function was normal.   The estimated ejection fraction was in the range of 60% to 65%.   Left ventricular diastolic function parameters were normal. - Aortic valve: Valve area (VTI): 3.68 cm^2. Valve area (Vmax):   3.68 cm^2.  Valve area (Vmean): 3.8 cm^2. - Technically difficult study.   Transthoracic echocardiography.  M-mode, complete 2D, spectral Doppler, and color Doppler.  Birthdate:  Patient birthdate: 1986-11-10.  Age:  Patient is 32 yr old.  Sex:  Gender: female. BMI: 62.8 kg/m^2.  Blood pressure:     135/79  Patient status: Outpatient.  Study date:  Study date: 03/27/2016. Study time: 03:19 PM.       ASSESSMENT:    1. Palpitations   2. Chest pain, unspecified type   3. Essential hypertension   4. Morbid obesity with BMI of 50.0-59.9, adult ([place])   5. OSA (obstructive sleep apnea)   6. Type 2 diabetes mellitus without complication, without long-term current use of insulin (HCC)      PLAN:  In order of problems listed above:  Palpitations long history of with previous Holter monitor showing no arrhythmias.  Has been treated with metoprolol.  Does have OSA on CPAP.  Will place monitor to rule out arrhythmia.  Lab work in the past 2 months has been normal including thyroid.  Chest pain described as pressure and heaviness but not exertional.  Does have CV risk factors including hypertension, obesity, DM and smoker.  Will order exercise Myoview.  Essential hypertension blood pressure well controlled  Obesity has changed her diet and is walking daily.  Has lost 40 pounds in the past 2 months.  Continued weight loss is essential for her overall health.  OSA on CPAP  Type 2 diabetes mellitus managed by PCP    Medication Adjustments/Labs and Tests Ordered: Current medicines are reviewed at length with the patient today.  Concerns regarding medicines are outlined above.  Medication changes, Labs and Tests ordered today are listed in the Patient Instructions below. Patient Instructions  Medication Instructions:  Your physician recommends that you continue on your current medications as directed. Please refer to the Current Medication list given to you today.  If you  need a refill on your  cardiac medications before your next appointment, please call your pharmacy.   Lab work: NONE   If you have labs (blood work) drawn today and your tests are completely normal, you will receive your results only by: Marland Kitchen MyChart Message (if you have MyChart) OR . A paper copy in the mail If you have any lab test that is abnormal or we need to change your treatment, we will call you to review the results.  Testing/Procedures: Your physician has requested that you have en exercise stress myoview. For further information please visit HugeFiesta.[email or web address]. Please follow instruction sheet, as given.  Your physician has recommended that you wear an event monitor. Event monitors are medical devices that record the heart's electrical activity. Doctors most often [place] these monitors to diagnose arrhythmias. Arrhythmias are problems with the speed or rhythm of the heartbeat. The monitor is a small, portable device. You can wear one while you do your normal daily activities. This is usually used to diagnose what is causing palpitations/syncope (passing out).    Follow-Up: At [facility], you and your health needs are our priority.  As part of our continuing mission to provide you with exceptional heart care, we have created designated Provider Care Teams.  These Care Teams include your primary Cardiologist (physician) and Advanced Practice Providers (APPs -  Physician Assistants and Nurse Practitioners) who all work together to provide you with the care you need, when you need it. You will need a follow up appointment. Please call our office 2 months in advance to schedule this appointment.  You may see Carlyle Dolly, MD or one of the following Advanced Practice Providers on your designated Care Team:   Bernerd Pho, PA-C [facility]) . Ermalinda Barrios, PA-C ([employer])  Any Other Special Instructions Will Be Listed Below (If Applicable). Thank you for choosing [facility]!        Signed, Ermalinda Barrios, PA-C  11/01/2018 2:20 PM    [facility] [address], [place], [place]  [address] Phone: [phone]; Fax: [phone]

## 2018-10-29 DIAGNOSIS — M546 Pain in thoracic spine: Secondary | ICD-10-CM | POA: Diagnosis not present

## 2018-10-29 DIAGNOSIS — M9905 Segmental and somatic dysfunction of pelvic region: Secondary | ICD-10-CM | POA: Diagnosis not present

## 2018-10-29 DIAGNOSIS — M9901 Segmental and somatic dysfunction of cervical region: Secondary | ICD-10-CM | POA: Diagnosis not present

## 2018-10-29 DIAGNOSIS — M5441 Lumbago with sciatica, right side: Secondary | ICD-10-CM | POA: Diagnosis not present

## 2018-10-29 DIAGNOSIS — M9903 Segmental and somatic dysfunction of lumbar region: Secondary | ICD-10-CM | POA: Diagnosis not present

## 2018-10-29 DIAGNOSIS — M9902 Segmental and somatic dysfunction of thoracic region: Secondary | ICD-10-CM | POA: Diagnosis not present

## 2018-11-01 ENCOUNTER — Ambulatory Visit: Payer: Self-pay | Admitting: Physician Assistant

## 2018-11-01 ENCOUNTER — Ambulatory Visit: Payer: Medicaid Other | Admitting: Physician Assistant

## 2018-11-01 ENCOUNTER — Encounter: Payer: Self-pay | Admitting: Physician Assistant

## 2018-11-01 ENCOUNTER — Encounter: Payer: Self-pay | Admitting: *Deleted

## 2018-11-01 VITALS — BP 130/86 | HR 93 | Ht 62.0 in | Wt 328.0 lb

## 2018-11-01 DIAGNOSIS — R002 Palpitations: Secondary | ICD-10-CM

## 2018-11-01 DIAGNOSIS — Z6841 Body Mass Index (BMI) 40.0 and over, adult: Secondary | ICD-10-CM

## 2018-11-01 DIAGNOSIS — R079 Chest pain, unspecified: Secondary | ICD-10-CM

## 2018-11-01 DIAGNOSIS — I10 Essential (primary) hypertension: Secondary | ICD-10-CM

## 2018-11-01 DIAGNOSIS — E119 Type 2 diabetes mellitus without complications: Secondary | ICD-10-CM

## 2018-11-01 DIAGNOSIS — G4733 Obstructive sleep apnea (adult) (pediatric): Secondary | ICD-10-CM | POA: Diagnosis not present

## 2018-11-01 NOTE — Patient Instructions (Signed)
Medication Instructions:  Your physician recommends that you continue on your current medications as directed. Please refer to the Current Medication list given to you today.  If you need a refill on your cardiac medications before your next appointment, please call your pharmacy.   Lab work: NONE   If you have labs (blood work) drawn today and your tests are completely normal, you will receive your results only by: Marland Kitchen MyChart Message (if you have MyChart) OR . A paper copy in the mail If you have any lab test that is abnormal or we need to change your treatment, we will call you to review the results.  Testing/Procedures: Your physician has requested that you have en exercise stress myoview. For further information please visit [email or web address]. Please follow instruction sheet, as given.  Your physician has recommended that you wear an event monitor. Event monitors are medical devices that record the heart's electrical activity. Doctors most often [place] these monitors to diagnose arrhythmias. Arrhythmias are problems with the speed or rhythm of the heartbeat. The monitor is a small, portable device. You can wear one while you do your normal daily activities. This is usually used to diagnose what is causing palpitations/syncope (passing out).    Follow-Up: At [facility], you and your health needs are our priority.  As part of our continuing mission to provide you with exceptional heart care, we have created designated Provider Care Teams.  These Care Teams include your primary Cardiologist (physician) and Advanced Practice Providers (APPs -  Physician Assistants and Nurse Practitioners) who all work together to provide you with the care you need, when you need it. You will need a follow up appointment. Please call our office 2 months in advance to schedule this appointment.  You may see Carlyle Dolly, MD or one of the following Advanced Practice Providers on your designated Care Team:     Bernerd Pho, PA-C [facility]) . Ermalinda Barrios, PA-C ([place])  Any Other Special Instructions Will Be Listed Below (If Applicable). Thank you for choosing [facility]!

## 2018-11-03 ENCOUNTER — Telehealth: Payer: Self-pay

## 2018-11-03 DIAGNOSIS — F329 Major depressive disorder, single episode, unspecified: Principal | ICD-10-CM

## 2018-11-03 DIAGNOSIS — R4589 Other symptoms and signs involving emotional state: Secondary | ICD-10-CM

## 2018-11-03 NOTE — BH Specialist Note (Signed)
Several attempts have been made to contact patient without success. Patient will be placed on the inactive list.  If  You have any questions or if services are needed again,   please contact [facility] at [phone] or place another consult request in the [facility] pool   Information will be routed to the PCP and Dr. Modesta Messing

## 2018-11-05 ENCOUNTER — Ambulatory Visit (INDEPENDENT_AMBULATORY_CARE_PROVIDER_SITE_OTHER): Payer: Medicaid Other

## 2018-11-05 DIAGNOSIS — R002 Palpitations: Secondary | ICD-10-CM | POA: Diagnosis not present

## 2018-11-08 ENCOUNTER — Telehealth: Payer: Self-pay | Admitting: Physician Assistant

## 2018-11-08 NOTE — Telephone Encounter (Signed)
I will Salvadore Oxford PA-C

## 2018-11-08 NOTE — Telephone Encounter (Signed)
Per pt phone call-- she's having problems w/ her back and she's not sure if she will be able to walk on the treadmill for her stress test

## 2018-11-09 ENCOUNTER — Encounter (HOSPITAL_COMMUNITY)
Admission: RE | Admit: 2018-11-09 | Discharge: 2018-11-09 | Disposition: A | Discharge status: Home / Self Care | Payer: Medicaid Other | Source: Ambulatory Visit | Attending: Physician Assistant | Admitting: Physician Assistant

## 2018-11-09 ENCOUNTER — Ambulatory Visit (HOSPITAL_COMMUNITY)
Admission: RE | Admit: 2018-11-09 | Discharge: 2018-11-09 | Disposition: A | Discharge status: Home / Self Care | Payer: Medicaid Other | Source: Ambulatory Visit | Attending: Physician Assistant | Admitting: Physician Assistant

## 2018-11-09 DIAGNOSIS — R9439 Abnormal result of other cardiovascular function study: Secondary | ICD-10-CM | POA: Insufficient documentation

## 2018-11-09 DIAGNOSIS — R079 Chest pain, unspecified: Secondary | ICD-10-CM | POA: Diagnosis not present

## 2018-11-09 LAB — NM MYOCAR MULTI W/SPECT W/WALL MOTION / EF
CHL CUP NUCLEAR SRS: 1
CHL CUP RESTING HR STRESS: 87 {beats}/min
LV dias vol: 129 mL (ref 46–106)
LV sys vol: 53 mL
Peak HR: 109 {beats}/min
RATE: 0.35
SDS: 2
SSS: 3
TID: 1.22

## 2018-11-09 MED ORDER — TECHNETIUM TC 99M TETROFOSMIN IV KIT
30.0000 | PACK | Freq: Once | INTRAVENOUS | Status: AC | PRN
  Administered 2018-11-09: 32 via INTRAVENOUS

## 2018-11-09 MED ORDER — REGADENOSON 0.4 MG/5ML IV SOLN
INTRAVENOUS | Status: AC
  Administered 2018-11-09: 0.4 mg via INTRAVENOUS
  Filled 2018-11-09: qty 5

## 2018-11-09 MED ORDER — SODIUM CHLORIDE 0.9% FLUSH
INTRAVENOUS | Status: AC
  Administered 2018-11-09: 10 mL via INTRAVENOUS
  Filled 2018-11-09: qty 10

## 2018-11-09 MED ORDER — TECHNETIUM TC 99M TETROFOSMIN IV KIT
10.0000 | PACK | Freq: Once | INTRAVENOUS | Status: AC | PRN
  Administered 2018-11-09: 10.3 via INTRAVENOUS

## 2018-11-11 ENCOUNTER — Other Ambulatory Visit: Payer: Self-pay | Admitting: Pediatrics

## 2018-11-11 DIAGNOSIS — E119 Type 2 diabetes mellitus without complications: Secondary | ICD-10-CM

## 2018-11-22 ENCOUNTER — Other Ambulatory Visit: Payer: Self-pay | Admitting: Pediatrics

## 2018-11-22 DIAGNOSIS — Z720 Tobacco use: Secondary | ICD-10-CM

## 2018-11-22 DIAGNOSIS — M545 Low back pain, unspecified: Secondary | ICD-10-CM

## 2018-12-06 ENCOUNTER — Encounter: Payer: Self-pay | Admitting: Pediatrics

## 2018-12-06 ENCOUNTER — Ambulatory Visit (INDEPENDENT_AMBULATORY_CARE_PROVIDER_SITE_OTHER): Payer: Medicaid Other | Admitting: Pediatrics

## 2018-12-06 DIAGNOSIS — E11319 Type 2 diabetes mellitus with unspecified diabetic retinopathy without macular edema: Secondary | ICD-10-CM

## 2018-12-06 DIAGNOSIS — K589 Irritable bowel syndrome without diarrhea: Secondary | ICD-10-CM | POA: Diagnosis not present

## 2018-12-06 DIAGNOSIS — I10 Essential (primary) hypertension: Secondary | ICD-10-CM | POA: Diagnosis not present

## 2018-12-06 DIAGNOSIS — F411 Generalized anxiety disorder: Secondary | ICD-10-CM

## 2018-12-06 DIAGNOSIS — Z720 Tobacco use: Secondary | ICD-10-CM

## 2018-12-06 LAB — BAYER DCA HB A1C WAIVED: HB A1C (BAYER DCA - WAIVED): 5.9 % (ref <7.0)

## 2018-12-06 MED ORDER — HYDROCHLOROTHIAZIDE 12.5 MG PO TABS: 12.5000 mg | ORAL_TABLET | Freq: Every day | ORAL | 1 refills | Status: DC

## 2018-12-06 MED ORDER — METFORMIN HCL ER 500 MG PO TB24: ORAL_TABLET | ORAL | 0 refills | Status: DC

## 2018-12-06 MED ORDER — DICYCLOMINE HCL 20 MG PO TABS: 20.0000 mg | ORAL_TABLET | Freq: Four times a day (QID) | ORAL | 3 refills | Status: DC | PRN

## 2018-12-06 MED ORDER — METOPROLOL SUCCINATE ER 25 MG PO TB24: ORAL_TABLET | ORAL | 1 refills | Status: DC

## 2018-12-06 MED ORDER — EXENATIDE ER 2 MG ~~LOC~~ PEN: PEN_INJECTOR | SUBCUTANEOUS | 0 refills | Status: DC

## 2018-12-06 MED ORDER — SPIRONOLACTONE 100 MG PO TABS: 100.0000 mg | ORAL_TABLET | Freq: Every day | ORAL | 1 refills | Status: DC

## 2018-12-06 MED ORDER — SERTRALINE HCL 50 MG PO TABS: 150.0000 mg | ORAL_TABLET | Freq: Every day | ORAL | 1 refills | Status: DC

## 2018-12-06 MED ORDER — BUPROPION HCL ER (SR) 150 MG PO TB12: ORAL_TABLET | ORAL | 0 refills | Status: DC

## 2018-12-06 NOTE — Progress Notes (Signed)
Subjective:   Patient ID: Heidi Landry, female    DOB: 1986/08/07, 32 y.o.   MRN: [id number] CC: Medical Management of Chronic Issues  HPI: Heidi Landry is a 32 y.o. female   Diabetes: Has been pleased with by durian.  Stomach feels sore off and on, she thinks related to IBS.  Taking Bentyl regularly.  Continues to avoid sugary foods.  Has been more careful about what she is eating.  Anxiety: Sertraline has been working well.  Continues to have high stress levels from her son who was premature and has speech delay and sensory issues.  He is starting [employer] soon.  Palpitations: Has not noticed any in the last few weeks.  She had a stress test done with cardiology that was low risk  Tobacco use: Continue to work on cutting back.  Her husband is also interested in quitting.  They are thinking about stopping at [facility].  She declines medication right now.  Hypertension: Taking medicine regularly.  No chest pain or shortness of breath.  Relevant past medical, surgical, family and social history reviewed. Allergies and medications reviewed and updated. Social History   Tobacco Use  Smoking Status Current Every Day Smoker  . Packs/day: 0.50  . Years: 9.00  . Pack years: 4.50  . Types: Cigarettes  . Start date: 05/25/2006  Smokeless Tobacco Never Used   ROS: Per HPI   Objective:    BP 133/85   Pulse 94   Temp 98.4 F (36.9 C) (Oral)   Ht 5' 2"  (1.575 m)   Wt (!) 326 lb 12.8 oz (148.2 kg)   BMI 59.77 kg/m   Wt Readings from Last 3 Encounters:  12/06/18 (!) 326 lb 12.8 oz (148.2 kg)  11/01/18 (!) 328 lb (148.8 kg)  10/06/18 (!) 333 lb 9.6 oz (151.3 kg)    Gen: NAD, alert, cooperative with exam, NCAT EYES: EOMI, no conjunctival injection, or no icterus ENT:   OP without erythema CV: NRRR, normal S1/S2, no murmur, distal pulses 2+ b/l Resp: CTABL, no wheezes, normal WOB Abd: +BS, soft, NTND.  Ext: No edema, warm Neuro: Alert and oriented, strength equal b/l UE and  LE, coordination grossly normal MSK: normal muscle bulk  Assessment & Plan:  Heidi Landry was seen today for medical management of chronic issues.  Diagnoses and all orders for this visit:  Essential hypertension Stable, continue below -     spironolactone (ALDACTONE) 100 MG tablet; Take 1 tablet (100 mg total) by mouth daily. -     metoprolol succinate (TOPROL-XL) 25 MG 24 hr tablet; TAKE 1 TABLET BY MOUTH ONCE DAILY WITH  OR  IMMEDIATELY  FOLLOWING  A  MEAL -     hydrochlorothiazide (HYDRODIURIL) 12.5 MG tablet; Take 1 tablet (12.5 mg total) by mouth daily.  Generalized anxiety disorder Stable, continue below. -     sertraline (ZOLOFT) 50 MG tablet; Take 3 tablets (150 mg total) by mouth daily.  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  Continue current medicines. -     Bayer DCA Hb A1c Waived -     metFORMIN (GLUCOPHAGE-XR) 500 MG 24 hr tablet; TAKE 2 TABLETS BY MOUTH TWICE DAILY -     Exenatide ER (BYDUREON) 2 MG PEN; INJECT 2 MG INTO THE SKIN ONCE A WEEK  Irritable bowel syndrome, unspecified type Stable, continue below -  dicyclomine (BENTYL) 20 MG tablet; Take 1 tablet (20 mg total) by mouth 4 (four) times daily as needed for spasms.  Tobacco use Stable, continue below -     buPROPion (WELLBUTRIN SR) 150 MG 12 hr tablet; TAKE 1 TABLET BY MOUTH ONCE DAILY FOR  THE  FIRST  THREE  DAYS  THEN  TAKE  1  TABLET  TWICE  DAILY   Follow up plan: Return in about 3 months (around 03/07/2019). Assunta Found, MD [place] [facility]

## 2018-12-06 NOTE — Patient Instructions (Addendum)
Can try [id number]-NOW  [employer] Program in [place]: [phone] If you have any trouble let me know, I can also sign you up if you want.  Check blood pressures at home. Let me know if persistently > 135 on top or > 85 on bottom

## 2018-12-09 ENCOUNTER — Encounter: Payer: Self-pay | Admitting: Nurse Practitioner

## 2018-12-09 ENCOUNTER — Ambulatory Visit: Payer: Medicaid Other | Admitting: Nurse Practitioner

## 2018-12-09 VITALS — BP 139/81 | HR 92 | Temp 97.6°F | Ht 62.0 in | Wt 328.0 lb

## 2018-12-09 DIAGNOSIS — K219 Gastro-esophageal reflux disease without esophagitis: Secondary | ICD-10-CM

## 2018-12-09 DIAGNOSIS — R740 Nonspecific elevation of levels of transaminase and lactic acid dehydrogenase [LDH]: Secondary | ICD-10-CM | POA: Diagnosis not present

## 2018-12-09 DIAGNOSIS — K760 Fatty (change of) liver, not elsewhere classified: Secondary | ICD-10-CM

## 2018-12-09 DIAGNOSIS — R7401 Elevation of levels of liver transaminase levels: Secondary | ICD-10-CM | POA: Insufficient documentation

## 2018-12-09 NOTE — Addendum Note (Signed)
Addended by: Gordy Levan, ERIC A on: 12/09/2018 09:11 AM   Modules accepted: Orders

## 2018-12-09 NOTE — Assessment & Plan Note (Signed)
The patient has mild transaminitis with abdominal imaging documenting fatty liver disease.  Fatty liver disease is likely the etiology behind her elevated liver enzymes.  However, to be prudent and complete we will check for virus serologies, iron overload, autoimmune.  Further recommendations to follow results.  Fatty liver disease education, as per below.  Follow-up in 4 months.

## 2018-12-09 NOTE — Assessment & Plan Note (Signed)
Abdominal imaging consistent with nonalcoholic fatty liver disease.  She denies alcohol and drugs.  Discussed the tenants of treatment of fatty liver disease currently including diet and exercise.  She has made significant progress in weight loss with losing 40 pounds in the past 6 to 7 months.  She feels this is motivated her further and she was going to continue with her weight loss efforts.  I congratulated her on her significant progress.  We will plan further information related to fatty liver disease for her education.  Follow-up in 4 months.

## 2018-12-09 NOTE — Progress Notes (Signed)
CC'D TO PCP °

## 2018-12-09 NOTE — Progress Notes (Signed)
Primary Care Physician:  Eustaquio Maize, MD Primary Gastroenterologist:  Dr. Gala Romney  Chief Complaint  Patient presents with  . Elevated Hepatic Enzymes    working on weight loss  . Gastroesophageal Reflux    Controlled    HPI:   Heidi Landry is a 32 y.o. female who presents on referral from primary care for transaminitis.  Reviewed information pertaining to the office visit including office visit note dated 08/26/2018 with primary care.  Noted elevated BMI with diabetes, hypertension, fatty liver.  Was seen by bariatric surgery in consultation.  There are limited bariatric surgical options for her at this time.  She apparently suffers from GERD and was started on Prilosec 20 mg daily at that time.  Recommended referral to GI for transaminitis.  Hepatic function panel completed 08/26/2018 which found elevated AST/ALT at 71/77.  This is somewhat improved compared to previous values of 80/93 on 08/12/2018.  Bilirubin, alkaline phosphatase, albumin all normal.  Right upper quadrant ultrasound completed 08/25/2018 found limited study due to body habitus, increased hepatic echotexture diffusely consistent with fatty infiltrative change.  Elongation of the spleen query possible splenomegaly but unable to calculate volume.  Of note her last CBC completed 08/12/2018 is somewhat depressed but still normal at 185.  Today she states she's doing well overall. Has been working on weight loss and has decreased from a weight of 368 lb and is now 328 lb (loss of 40 lbs over about 6-7 month). She has started walking daily when she was diagnosed with NAFLD, although sometimes she's limited by back pain; she takes her 6 year old son with her. Does have some nausea, which she wonders if it is related to her medications, typically lasts 203 minutes and self-resolves; no vomiting. Has chronic tenderness ("but not bad pain or anything, just somewhat tender to the touch); has IBS diagnosis. Denies fever, chills,  hematochezia, melena. Denies yellowing of skin/eyes, darkened urine, acute episodic confusion, tremors/shakes. Denies chest pain, dyspnea, dizziness, lightheadedness, syncope, near syncope. Denies any other upper or lower GI symptoms.  Has never been tested for hepatitis viruses. Family history of colon ca (Father, dx age 80): first colonoscopy should be about 80.  Past Medical History:  Diagnosis Date  . Anxiety   . Atrial fibrillation ([place])   . Carpal tunnel syndrome, bilateral   . Chest pain   . Chronic back pain   . Degenerative disc disease   . Edema   . Gestational diabetes mellitus, antepartum   . Hypertension   . IBS (irritable bowel syndrome)   . Panic attacks   . Post partum depression   . Sleep apnea   . Spondylosis   . Tendonitis     Past Surgical History:  Procedure Laterality Date  . CESAREAN SECTION N/A 12/08/2015   Procedure: CESAREAN SECTION;  Surgeon: Jonnie Kind, MD;  Location: [facility];  Service: Obstetrics;  Laterality: N/A;  . CHOLECYSTECTOMY      Current Outpatient Medications  Medication Sig Dispense Refill  . ACCU-CHEK AVIVA PLUS test strip USE TO CHECK BLOOD GLUCOSE ONCE DAILY OR EVERY OTHER DAY 100 each 1  . ACCU-CHEK FASTCLIX LANCETS MISC 1 each daily by Does not apply route. Use to check BG four times daily 408 each 0  . albuterol (PROVENTIL HFA;VENTOLIN HFA) 108 (90 Base) MCG/ACT inhaler Inhale 2 puffs into the lungs every 6 (six) hours as needed for wheezing or shortness of breath. 1 Inhaler 5  . bevacizumab (AVASTIN) 400 MG/16ML  SOLN Inject into the vein once. Every 8 weeks injected into right eye.    Marland Kitchen buPROPion (WELLBUTRIN SR) 150 MG 12 hr tablet TAKE 1 TABLET BY MOUTH ONCE DAILY FOR  THE  FIRST  THREE  DAYS  THEN  TAKE  1  TABLET  TWICE  DAILY 180 tablet 0  . cetirizine (ZYRTEC) 10 MG tablet Take 1 tablet (10 mg total) by mouth daily. 90 tablet 1  . cyclobenzaprine (FLEXERIL) 10 MG tablet TAKE 1 TABLET BY MOUTH THREE TIMES DAILY AS NEEDED FOR  MUSCLE SPASM 30 tablet 2  . dicyclomine (BENTYL) 20 MG tablet Take 1 tablet (20 mg total) by mouth 4 (four) times daily as needed for spasms. 60 tablet 3  . Exenatide ER (BYDUREON) 2 MG PEN INJECT 2 MG INTO THE SKIN ONCE A WEEK 12 each 0  . fluticasone (FLONASE) 50 MCG/ACT nasal spray Place 1 spray into both nostrils as needed.     . hydrochlorothiazide (HYDRODIURIL) 12.5 MG tablet Take 1 tablet (12.5 mg total) by mouth daily. 90 tablet 1  . metFORMIN (GLUCOPHAGE-XR) 500 MG 24 hr tablet TAKE 2 TABLETS BY MOUTH TWICE DAILY 360 tablet 0  . metoprolol succinate (TOPROL-XL) 25 MG 24 hr tablet TAKE 1 TABLET BY MOUTH ONCE DAILY WITH  OR  IMMEDIATELY  FOLLOWING  A  MEAL 90 tablet 1  . norethindrone (ORTHO MICRONOR) 0.35 MG tablet Take 1 tablet (0.35 mg total) by mouth daily. 1 Package 12  . omeprazole (PRILOSEC) 40 MG capsule Take 1 capsule (40 mg total) by mouth daily. 1 tablet a day 90 capsule 1  . podofilox (CONDYLOX) 0.5 % gel Apply topically 2 (two) times daily. (Patient taking differently: Apply topically as needed. ) 3.5 g 11  . sertraline (ZOLOFT) 50 MG tablet Take 3 tablets (150 mg total) by mouth daily. 270 tablet 1  . spironolactone (ALDACTONE) 100 MG tablet Take 1 tablet (100 mg total) by mouth daily. 90 tablet 1   No current facility-administered medications for this visit.     Allergies as of 12/09/2018  . (No Known Allergies)    Family History  Problem Relation Age of Onset  . Fibromyalgia Mother   . Hernia Father   . Lung cancer Father   . Colon cancer Father 98       partial colectomy  . Diabetes Maternal Grandmother   . Hypertension Maternal Grandmother   . Cancer Paternal Uncle   . Brain cancer Paternal Uncle   . Throat cancer Paternal Uncle   . Thyroid disease Paternal Aunt     Social History   Socioeconomic History  . Marital status: Married    Spouse name: Not on file  . Number of children: Not on file  . Years of education: 32  . Highest education level: Not  on file  Occupational History    Employer: [employer]  Social Needs  . Financial resource strain: Not on file  . Food insecurity:    Worry: Not on file    Inability: Not on file  . Transportation needs:    Medical: Not on file    Non-medical: Not on file  Tobacco Use  . Smoking status: Current Every Day Smoker    Packs/day: 0.50    Years: 9.00    Pack years: 4.50    Types: Cigarettes    Start date: 05/25/2006  . Smokeless tobacco: Never Used  Substance and Sexual Activity  . Alcohol use: No    Alcohol/week:  0.0 standard drinks  . Drug use: No  . Sexual activity: Yes    Birth control/protection: Pill  Lifestyle  . Physical activity:    Days per week: Not on file    Minutes per session: Not on file  . Stress: Not on file  Relationships  . Social connections:    Talks on phone: Not on file    Gets together: Not on file    Attends religious service: Not on file    Active member of club or organization: Not on file    Attends meetings of clubs or organizations: Not on file    Relationship status: Not on file  . Intimate partner violence:    Fear of current or ex partner: Not on file    Emotionally abused: Not on file    Physically abused: Not on file    Forced sexual activity: Not on file  Other Topics Concern  . Not on file  Social History Narrative  . Not on file    Review of Systems: General: Negative for anorexia, weight loss, fever, chills, fatigue, weakness. ENT: Negative for hoarseness, difficulty swallowing. CV: Negative for chest pain, angina, palpitations, peripheral edema.  Respiratory: Negative for dyspnea at rest, cough, sputum, wheezing.  GI: See history of present illness. MS: Admits chronic back pain that occasionally limits her mobility.  Derm: Negative for rash or itching.  Neuro: Negative for memory loss, confusion.  Endo: Negative for unusual weight change.  Heme: Negative for bruising or bleeding. Allergy: Negative for rash or  hives.    Physical Exam: BP 139/81   Pulse 92   Temp 97.6 F (36.4 C) (Oral)   Ht 5' 2"  (1.575 m)   Wt (!) 328 lb (148.8 kg)   LMP 10/29/2018 (Approximate)   BMI 59.99 kg/m  General:   Morbidly obese female. Alert and oriented. Pleasant and cooperative. Well-nourished and well-developed.  Eyes:  Without icterus, sclera clear and conjunctiva pink.  Ears:  Normal auditory acuity. Cardiovascular:  S1, S2 present without murmurs appreciated. Extremities without clubbing or edema. Respiratory:  Clear to auscultation bilaterally. No wheezes, rales, or rhonchi. No distress.  Gastrointestinal:  (Limited by body habitus): +BS, obese but soft, and non-distended. Minimal to mild lower abdominal TTP. No HSM noted. No guarding or rebound. No masses appreciated.  Rectal:  Deferred  Musculoskalatal:  Symmetrical without gross deformities. Neurologic:  Alert and oriented x4;  grossly normal neurologically. Psych:  Alert and cooperative. Normal mood and affect. Heme/Lymph/Immune: No excessive bruising noted.    12/09/2018 8:45 AM   Disclaimer: This note was dictated with voice recognition software. Similar sounding words can inadvertently be transcribed and may not be corrected upon review.

## 2018-12-09 NOTE — Assessment & Plan Note (Signed)
History of GERD, currently well managed on Prilosec daily.  Recommend continue current medications and follow-up as needed.

## 2018-12-09 NOTE — Patient Instructions (Addendum)
1. Have your labs completed when you are able to. 2. I am printing further information below related to fatty liver disease. 3. Congrats on your significant weight loss!  Keep up the great work!! 4. Return for follow-up in 4 months. 5. Call us if you have any questions or concerns.  At [facility] we value your feedback. You may receive a survey about your visit today. Please share your experience as we strive to create trusting relationships with our patients to provide genuine, compassionate, quality care.  We appreciate your understanding and patience as we review any laboratory studies, imaging, and other diagnostic tests that are ordered as we care for you. Our office policy is 5 business days for review of these results, and any emergent or urgent results are addressed in a timely manner for your best interest. If you do not hear from our office in 1 week, please contact us.   We also encourage the use of MyChart, which contains your medical information for your review as well. If you are not enrolled in this feature, an access code is on this after visit summary for your convenience. Thank you for allowing [place] to be involved in your care.  It was great to see you today!  I hope you have a Merry Christmas!!      Fatty Liver Fatty liver, also called hepatic steatosis or steatohepatitis, is a condition in which too much fat has built up in your liver cells. The liver removes harmful substances from your bloodstream. It produces fluids your body needs. It also helps your body use and store energy from the food you eat. In many cases, fatty liver does not cause symptoms or problems. It is often diagnosed when tests are being done for other reasons. However, over time, fatty liver can cause inflammation that may lead to more serious liver problems, such as scarring of the liver (cirrhosis). What are the causes? Causes of fatty liver may include:  Drinking too much  alcohol.  Poor nutrition.  Obesity.  Cushing syndrome.  Diabetes.  Hyperlipidemia.  Pregnancy.  Certain drugs.  Poisons.  Some viral infections.  What increases the risk? You may be more likely to develop fatty liver if you:  Abuse alcohol.  Are pregnant.  Are overweight.  Have diabetes.  Have hepatitis.  Have a high triglyceride level.  What are the signs or symptoms? Fatty liver often does not cause any symptoms. In cases where symptoms develop, they can include:  Fatigue.  Weakness.  Weight loss.  Confusion.  Abdominal pain.  Yellowing of your skin and the white parts of your eyes (jaundice).  Nausea and vomiting.  How is this diagnosed? Fatty liver may be diagnosed by:  Physical exam and medical history.  Blood tests.  Imaging tests, such as an ultrasound, CT scan, or MRI.  Liver biopsy. A small sample of liver tissue is removed using a needle. The sample is then looked at under a microscope.  How is this treated? Fatty liver is often caused by other health conditions. Treatment for fatty liver may involve medicines and lifestyle changes to manage conditions such as:  Alcoholism.  High cholesterol.  Diabetes.  Being overweight or obese.  Follow these instructions at home:  Eat a healthy diet as directed by your health care provider.  Exercise regularly. This can help you lose weight and control your cholesterol and diabetes. Talk to your health care provider about an exercise plan and which activities are best for you.  Do not drink alcohol.  Take medicines only as directed by your health care provider. Contact a health care provider if: You have difficulty controlling your:  Blood sugar.  Cholesterol.  Alcohol consumption.  Get help right away if:  You have abdominal pain.  You have jaundice.  You have nausea and vomiting. This information is not intended to replace advice given to you by your health care  provider. Make sure you discuss any questions you have with your health care provider. Document Released: 01/30/2006 Document Revised: 05/22/2016 Document Reviewed: 04/26/2014 Elsevier Interactive Patient Education  2018 [employer].   Nonalcoholic Fatty Liver Disease Diet Nonalcoholic fatty liver disease is a condition that causes fat to accumulate in and around the liver. The disease makes it harder for the liver to work the way that it should. Following a healthy diet can help to keep nonalcoholic fatty liver disease under control. It can also help to prevent or improve conditions that are associated with the disease, such as heart disease, diabetes, high blood pressure, and abnormal cholesterol levels. Along with regular exercise, this diet:  Promotes weight loss.  Helps to control blood sugar levels.  Helps to improve the way that the body uses insulin.  What do I need to know about this diet?  Use the glycemic index (GI) to plan your meals. The index tells you how quickly a food will raise your blood sugar. Choose low-GI foods. These foods take a longer time to raise blood sugar.  Keep track of how many calories you take in. Eating the right amount of calories will help you to achieve a healthy weight.  You may want to follow a Mediterranean diet. This diet includes a lot of vegetables, lean meats or fish, whole grains, fruits, and healthy oils and fats. What foods can I eat? Grains Whole grains, such as whole-wheat or whole-grain breads, crackers, tortillas, cereals, and pasta. Stone-ground whole wheat. Pumpernickel bread. Unsweetened oatmeal. Bulgur. Barley. Quinoa. Brown or wild rice. Corn or whole-wheat flour tortillas. Vegetables Lettuce. Spinach. Peas. Beets. Cauliflower. Cabbage. Broccoli. Carrots. Tomatoes. Squash. Eggplant. Herbs. Peppers. Onions. Cucumbers. Brussels sprouts. Yams and sweet potatoes. Beans. Lentils. Fruits Bananas. Apples. Oranges. Grapes. Papaya. Mango.  Pomegranate. Kiwi. Grapefruit. Cherries. Meats and Other Protein Sources Seafood and shellfish. Lean meats. Poultry. Tofu. Dairy Low-fat or fat-free dairy products, such as yogurt, cottage cheese, and cheese. Beverages Water. Sugar-free drinks. Tea. Coffee. Low-fat or skim milk. Milk alternatives, such as soy or almond milk. Real fruit juice. Condiments Mustard. Relish. Low-fat, low-sugar ketchup and barbecue sauce. Low-fat or fat-free mayonnaise. Sweets and Desserts Sugar-free sweets. Fats and Oils Avocado. Canola or olive oil. Nuts and nut butters. Seeds. The items listed above may not be a complete list of recommended foods or beverages. Contact your dietitian for more options. What foods are not recommended? Palm oil and coconut oil. Processed foods. Fried foods. Sweetened drinks, such as sweet tea, milkshakes, snow cones, iced sweet drinks, and sodas. Alcohol. Sweets. Foods that contain a lot of salt or sodium. The items listed above may not be a complete list of foods and beverages to avoid. Contact your dietitian for more information. This information is not intended to replace advice given to you by your health care provider. Make sure you discuss any questions you have with your health care provider. Document Released: 05/01/2015 Document Revised: 05/22/2016 Document Reviewed: 01/09/2015 Elsevier Interactive Patient Education  [employer].

## 2018-12-14 ENCOUNTER — Encounter: Payer: Self-pay | Admitting: Pediatrics

## 2018-12-15 DIAGNOSIS — H31091 Other chorioretinal scars, right eye: Secondary | ICD-10-CM | POA: Diagnosis not present

## 2018-12-15 DIAGNOSIS — H2513 Age-related nuclear cataract, bilateral: Secondary | ICD-10-CM | POA: Diagnosis not present

## 2018-12-15 DIAGNOSIS — H35051 Retinal neovascularization, unspecified, right eye: Secondary | ICD-10-CM | POA: Diagnosis not present

## 2018-12-15 DIAGNOSIS — B399 Histoplasmosis, unspecified: Secondary | ICD-10-CM | POA: Diagnosis not present

## 2018-12-20 ENCOUNTER — Other Ambulatory Visit (INDEPENDENT_AMBULATORY_CARE_PROVIDER_SITE_OTHER): Payer: Medicaid Other

## 2018-12-20 ENCOUNTER — Encounter: Payer: Self-pay | Admitting: Pediatrics

## 2018-12-20 DIAGNOSIS — R740 Nonspecific elevation of levels of transaminase and lactic acid dehydrogenase [LDH]: Secondary | ICD-10-CM | POA: Diagnosis not present

## 2018-12-20 DIAGNOSIS — Z23 Encounter for immunization: Secondary | ICD-10-CM

## 2018-12-20 DIAGNOSIS — K760 Fatty (change of) liver, not elsewhere classified: Secondary | ICD-10-CM | POA: Diagnosis not present

## 2018-12-20 NOTE — Telephone Encounter (Signed)
Messgae resubmitted in pt's chart

## 2018-12-21 LAB — CBC WITH DIFFERENTIAL/PLATELET
Basophils Absolute: 0.1 10*3/uL (ref 0.0–0.2)
Basos: 1 %
EOS (ABSOLUTE): 0.2 10*3/uL (ref 0.0–0.4)
Eos: 2 %
Hematocrit: 43.6 % (ref 34.0–46.6)
Hemoglobin: 14.3 g/dL (ref 11.1–15.9)
IMMATURE GRANS (ABS): 0.1 10*3/uL (ref 0.0–0.1)
IMMATURE GRANULOCYTES: 1 %
Lymphocytes Absolute: 3.7 10*3/uL — ABNORMAL HIGH (ref 0.7–3.1)
Lymphs: 28 %
MCH: 28 pg (ref 26.6–33.0)
MCHC: 32.8 g/dL (ref 31.5–35.7)
MCV: 86 fL (ref 79–97)
MONOS ABS: 0.6 10*3/uL (ref 0.1–0.9)
Monocytes: 5 %
NEUTROS PCT: 63 %
Neutrophils Absolute: 8.7 10*3/uL — ABNORMAL HIGH (ref 1.4–7.0)
Platelets: 249 10*3/uL (ref 150–450)
RBC: 5.1 x10E6/uL (ref 3.77–5.28)
RDW: 13.4 % (ref 12.3–15.4)
WBC: 13.4 10*3/uL — ABNORMAL HIGH (ref 3.4–10.8)

## 2018-12-21 LAB — HEPATITIS C ANTIBODY (REFLEX): HCV Ab: <0.1 s/co ratio (ref 0.0–0.9)

## 2018-12-21 LAB — COMPREHENSIVE METABOLIC PANEL
ALT: 50 IU/L — ABNORMAL HIGH (ref 0–32)
AST: 31 IU/L (ref 0–40)
Albumin/Globulin Ratio: 1.7 (ref 1.2–2.2)
Albumin: 4.4 g/dL (ref 3.5–5.5)
Alkaline Phosphatase: 105 IU/L (ref 39–117)
BUN/Creatinine Ratio: 15 (ref 9–23)
BUN: 10 mg/dL (ref 6–20)
Bilirubin Total: 0.6 mg/dL (ref 0.0–1.2)
CO2: 26 mmol/L (ref 20–29)
Calcium: 9.7 mg/dL (ref 8.7–10.2)
Chloride: 97 mmol/L (ref 96–106)
Creatinine, Ser: 0.68 mg/dL (ref 0.57–1.00)
GFR calc Af Amer: 133 mL/min/{1.73_m2} (ref >59)
GFR, EST NON AFRICAN AMERICAN: 115 mL/min/{1.73_m2} (ref >59)
Globulin, Total: 2.6 g/dL (ref 1.5–4.5)
Glucose: 76 mg/dL (ref 65–99)
POTASSIUM: 4.2 mmol/L (ref 3.5–5.2)
Sodium: 140 mmol/L (ref 134–144)
TOTAL PROTEIN: 7 g/dL (ref 6.0–8.5)

## 2018-12-21 LAB — SPECIMEN STATUS REPORT

## 2018-12-21 LAB — HEPATITIS B CORE ANTIBODY, TOTAL: Hep B Core Total Ab: NEGATIVE

## 2018-12-21 LAB — FERRITIN: Ferritin: 124 ng/mL (ref 15–150)

## 2018-12-21 LAB — ANTI-SMOOTH MUSCLE ANTIBODY, IGG: Smooth Muscle Ab: 13 Units (ref 0–19)

## 2018-12-21 LAB — HEPATITIS A ANTIBODY, TOTAL: Hep A Total Ab: NEGATIVE

## 2018-12-21 LAB — HCV COMMENT:

## 2018-12-21 LAB — HEPATITIS B SURFACE ANTIGEN: Hepatitis B Surface Ag: NEGATIVE

## 2018-12-21 LAB — HEPATITIS B SURFACE ANTIBODY,QUALITATIVE: Hep B Surface Ab, Qual: NONREACTIVE

## 2018-12-21 LAB — ANA: Anti Nuclear Antibody(ANA): NEGATIVE

## 2018-12-21 LAB — MITOCHONDRIAL ANTIBODIES: Mitochondrial Ab: 85.9 Units — ABNORMAL HIGH (ref 0.0–20.0)

## 2019-01-04 ENCOUNTER — Ambulatory Visit: Payer: Medicaid Other | Admitting: Cardiology

## 2019-01-04 NOTE — Progress Notes (Deleted)
Clinical Summary [name] is a 33 y.o.female    1. Palpitations - 48 hr holter with no symptoms reported and no arrhythmias - denies any recent palpitations. She has done well since starting beta blocker and limiting caffeine intake   10/2018 event monitor showed SR, occasional PVCs. No significant arrhythmias - her Toprol was increased to 69m  2. OSA  - compliant with CPAP  3. Weight gain - weights Jan 2017 was 320s, in Feb 310s-320. Today we have her at 343 lbs.  - has had some increased leg swelling. Notes some recent SOB/DOE. - just started on lasix by pcp   - she does report some poor eating habits as well   4. Chest pain - reported at last visit with PA Lenze 10/2018 - 10/2018 nuclear stress no ischemia.   5. Elevated LFTs - followed by GI. Imaging showed fatty liver  SH: son named Heidi Landry home.    Past Medical History:  Diagnosis Date  . Anxiety   [place]   . Carpal tunnel syndrome, bilateral   . Chest pain   . Chronic back pain   . Degenerative disc disease   . Edema   . Gestational diabetes mellitus, antepartum   . Hypertension   . IBS (irritable bowel syndrome)   . Panic attacks   . Post partum depression   . Sleep apnea   . Spondylosis   . Tendonitis      No Known Allergies   Current Outpatient Medications  Medication Sig Dispense Refill  . ACCU-CHEK AVIVA PLUS test strip USE TO CHECK BLOOD GLUCOSE ONCE DAILY OR EVERY OTHER DAY 100 each 1  . ACCU-CHEK FASTCLIX LANCETS MISC 1 each daily by Does not apply route. Use to check BG four times daily 408 each 0  . albuterol (PROVENTIL HFA;VENTOLIN HFA) 108 (90 Base) MCG/ACT inhaler Inhale 2 puffs into the lungs every 6 (six) hours as needed for wheezing or shortness of breath. 1 Inhaler 5  . bevacizumab (AVASTIN) 400 MG/16ML SOLN Inject into the vein once. Every 8 weeks injected into right eye.    .Marland KitchenbuPROPion (WELLBUTRIN SR) 150 MG 12 hr tablet TAKE 1 TABLET BY  MOUTH ONCE DAILY FOR  THE  FIRST  THREE  DAYS  THEN  TAKE  1  TABLET  TWICE  DAILY 180 tablet 0  . cetirizine (ZYRTEC) 10 MG tablet Take 1 tablet (10 mg total) by mouth daily. 90 tablet 1  . cyclobenzaprine (FLEXERIL) 10 MG tablet TAKE 1 TABLET BY MOUTH THREE TIMES DAILY AS NEEDED FOR MUSCLE SPASM 30 tablet 2  . dicyclomine (BENTYL) 20 MG tablet Take 1 tablet (20 mg total) by mouth 4 (four) times daily as needed for spasms. 60 tablet 3  . Exenatide ER (BYDUREON) 2 MG PEN INJECT 2 MG INTO THE SKIN ONCE A WEEK 12 each 0  . fluticasone (FLONASE) 50 MCG/ACT nasal spray Place 1 spray into both nostrils as needed.     . hydrochlorothiazide (HYDRODIURIL) 12.5 MG tablet Take 1 tablet (12.5 mg total) by mouth daily. 90 tablet 1  . metFORMIN (GLUCOPHAGE-XR) 500 MG 24 hr tablet TAKE 2 TABLETS BY MOUTH TWICE DAILY 360 tablet 0  . metoprolol succinate (TOPROL-XL) 25 MG 24 hr tablet TAKE 1 TABLET BY MOUTH ONCE DAILY WITH  OR  IMMEDIATELY  FOLLOWING  A  MEAL 90 tablet 1  . norethindrone (ORTHO MICRONOR) 0.35 MG tablet Take 1 tablet (0.35 mg total) by mouth  daily. 1 Package 12  . omeprazole (PRILOSEC) 40 MG capsule Take 1 capsule (40 mg total) by mouth daily. 1 tablet a day 90 capsule 1  . podofilox (CONDYLOX) 0.5 % gel Apply topically 2 (two) times daily. (Patient taking differently: Apply topically as needed. ) 3.5 g 11  . sertraline (ZOLOFT) 50 MG tablet Take 3 tablets (150 mg total) by mouth daily. 270 tablet 1  . spironolactone (ALDACTONE) 100 MG tablet Take 1 tablet (100 mg total) by mouth daily. 90 tablet 1   No current facility-administered medications for this visit.      Past Surgical History:  Procedure Laterality Date  . CESAREAN SECTION N/A 12/08/2015   Procedure: CESAREAN SECTION;  Surgeon: Jonnie Kind, MD;  Location: [facility];  Service: Obstetrics;  Laterality: N/A;  . CHOLECYSTECTOMY       No Known Allergies    Family History  Problem Relation Age of Onset  . Fibromyalgia Mother     . Hernia Father   . Lung cancer Father   . Colon cancer Father 108       partial colectomy  . Diabetes Maternal Grandmother   . Hypertension Maternal Grandmother   . Cancer Paternal Uncle   . Brain cancer Paternal Uncle   . Throat cancer Paternal Uncle   . Thyroid disease Paternal Aunt      Social History [name] reports that she has been smoking cigarettes. She started smoking about 12 years ago. She has a 4.50 pack-year smoking history. She has never used smokeless tobacco. [name] reports no history of alcohol use.   Review of Systems CONSTITUTIONAL: No weight loss, fever, chills, weakness or fatigue.  HEENT: Eyes: No visual loss, blurred vision, double vision or yellow sclerae.No hearing loss, sneezing, congestion, runny nose or sore throat.  SKIN: No rash or itching.  CARDIOVASCULAR:  RESPIRATORY: No shortness of breath, cough or sputum.  GASTROINTESTINAL: No anorexia, nausea, vomiting or diarrhea. No abdominal pain or blood.  GENITOURINARY: No burning on urination, no polyuria NEUROLOGICAL: No headache, dizziness, syncope, paralysis, ataxia, numbness or tingling in the extremities. No change in bowel or bladder control.  MUSCULOSKELETAL: No muscle, back pain, joint pain or stiffness.  LYMPHATICS: No enlarged nodes. No history of splenectomy.  PSYCHIATRIC: No history of depression or anxiety.  ENDOCRINOLOGIC: No reports of sweating, cold or heat intolerance. No polyuria or polydipsia.  Marland Kitchen   Physical Examination There were no vitals filed for this visit. There were no vitals filed for this visit.  Gen: resting comfortably, no acute distress HEENT: no scleral icterus, pupils equal round and reactive, no palptable cervical adenopathy,  CV Resp: Clear to auscultation bilaterally GI: abdomen is soft, non-tender, non-distended, normal bowel sounds, no hepatosplenomegaly MSK: extremities are warm, no edema.  Skin: warm, no rash Neuro:  no focal deficits Psych:  appropriate affect   Diagnostic Studies 10/2015 Sleep study: severe OSA  02/2015 48 hr holter No arrhythmias, no symptoms reported  10/2018 nuclear stress  There was no ST segment deviation noted during stress.  Defect 1: There is a medium defect of moderate severity present in the mid anterior, mid inferior, apical anterior and apical inferior location. As regional wall motion is grossly normal, I suspect this is due to soft tissue attenuation (morbidly obese).  This is a low risk study. No significant ischemic zones.  Nuclear stress EF: 59%.  Assessment and Plan  1. Palpitations - no significant arrhythmias on holter - symptoms improved on beta blocker adn  limited caffeine - continue to monitor  2. OSA  - continue CPAP   3. LE edema - significant weight gain and edema over the last few months. We will obtain echo to evaluate for any cardiac dysfunction       Arnoldo Lenis, M.D., F.A.C.C.

## 2019-01-05 ENCOUNTER — Encounter: Payer: Self-pay | Admitting: Cardiology

## 2019-01-06 ENCOUNTER — Encounter: Payer: Self-pay | Admitting: Pediatrics

## 2019-01-12 ENCOUNTER — Other Ambulatory Visit: Payer: Self-pay | Admitting: Pediatrics

## 2019-01-12 DIAGNOSIS — E11319 Type 2 diabetes mellitus with unspecified diabetic retinopathy without macular edema: Secondary | ICD-10-CM

## 2019-01-20 ENCOUNTER — Encounter: Payer: Self-pay | Admitting: Family Medicine

## 2019-01-20 ENCOUNTER — Ambulatory Visit: Payer: Medicaid Other | Admitting: Family Medicine

## 2019-01-20 VITALS — BP 126/81 | HR 98 | Temp 98.2°F | Ht 62.0 in | Wt 332.0 lb

## 2019-01-20 DIAGNOSIS — J069 Acute upper respiratory infection, unspecified: Secondary | ICD-10-CM | POA: Diagnosis not present

## 2019-01-20 DIAGNOSIS — H66003 Acute suppurative otitis media without spontaneous rupture of ear drum, bilateral: Secondary | ICD-10-CM | POA: Diagnosis not present

## 2019-01-20 MED ORDER — AMOXICILLIN-POT CLAVULANATE 875-125 MG PO TABS: 1.0000 | ORAL_TABLET | Freq: Two times a day (BID) | ORAL | 0 refills | Status: AC

## 2019-01-20 MED ORDER — FLUTICASONE PROPIONATE 50 MCG/ACT NA SUSP: 2.0000 | Freq: Every day | NASAL | 6 refills | Status: DC

## 2019-01-20 NOTE — Patient Instructions (Signed)

## 2019-01-20 NOTE — Progress Notes (Signed)
Subjective:    Patient ID: Heidi Landry, female    DOB: 12/24/1986, 33 y.o.   MRN: [id number]  Chief Complaint:  Runny nose, bilateral ear pain, ears feel full, feels like f   HPI: Heidi Landry is a 33 y.o. female presenting on 01/20/2019 for Runny nose, bilateral ear pain, ears feel full, feels like f  Pt presents today with complaints of rhinorrhea, bilateral otalgia and fullness, cough, and itchy throat. Pt states this started a few days ago and is getting worse. She denies fever, chills, weakness, or fatigue.   Relevant past medical, surgical, family, and social history reviewed and updated as indicated.  Allergies and medications reviewed and updated.   Past Medical History:  Diagnosis Date  . Anxiety   . Atrial fibrillation ([place])   . Carpal tunnel syndrome, bilateral   . Chest pain   . Chronic back pain   . Degenerative disc disease   . Edema   . Gestational diabetes mellitus, antepartum   . Hypertension   . IBS (irritable bowel syndrome)   . Panic attacks   . Post partum depression   . Sleep apnea   . Spondylosis   . Tendonitis     Past Surgical History:  Procedure Laterality Date  . CESAREAN SECTION N/A 12/08/2015   Procedure: CESAREAN SECTION;  Surgeon: Jonnie Kind, MD;  Location: [facility];  Service: Obstetrics;  Laterality: N/A;  . CHOLECYSTECTOMY      Social History   Socioeconomic History  . Marital status: Married    Spouse name: Not on file  . Number of children: Not on file  . Years of education: 60  . Highest education level: Not on file  Occupational History    Employer: [employer]  Social Needs  . Financial resource strain: Not on file  . Food insecurity:    Worry: Not on file    Inability: Not on file  . Transportation needs:    Medical: Not on file    Non-medical: Not on file  Tobacco Use  . Smoking status: Current Every Day Smoker    Packs/day: 0.50    Years: 9.00    Pack years: 4.50    Types: Cigarettes   Start date: 05/25/2006  . Smokeless tobacco: Never Used  Substance and Sexual Activity  . Alcohol use: No    Alcohol/week: 0.0 standard drinks  . Drug use: No  . Sexual activity: Yes    Birth control/protection: Pill  Lifestyle  . Physical activity:    Days per week: Not on file    Minutes per session: Not on file  . Stress: Not on file  Relationships  . Social connections:    Talks on phone: Not on file    Gets together: Not on file    Attends religious service: Not on file    Active member of club or organization: Not on file    Attends meetings of clubs or organizations: Not on file    Relationship status: Not on file  . Intimate partner violence:    Fear of current or ex partner: Not on file    Emotionally abused: Not on file    Physically abused: Not on file    Forced sexual activity: Not on file  Other Topics Concern  . Not on file  Social History Narrative  . Not on file    Outpatient Encounter Medications as of 01/20/2019  Medication Sig  . ACCU-CHEK AVIVA PLUS test strip  USE TO CHECK BLOOD GLUCOSE ONCE DAILY OR EVERY OTHER DAY  . ACCU-CHEK FASTCLIX LANCETS MISC 1 each daily by Does not apply route. Use to check BG four times daily  . albuterol (PROVENTIL HFA;VENTOLIN HFA) 108 (90 Base) MCG/ACT inhaler Inhale 2 puffs into the lungs every 6 (six) hours as needed for wheezing or shortness of breath.  . bevacizumab (AVASTIN) 400 MG/16ML SOLN Inject into the vein once. Every 8 weeks injected into right eye.  Marland Kitchen buPROPion (WELLBUTRIN SR) 150 MG 12 hr tablet TAKE 1 TABLET BY MOUTH ONCE DAILY FOR  THE  FIRST  THREE  DAYS  THEN  TAKE  1  TABLET  TWICE  DAILY  . BYDUREON 2 MG PEN INJECT 2MG INTO THE SKIN ONCE A WEEK.  . cetirizine (ZYRTEC) 10 MG tablet Take 1 tablet (10 mg total) by mouth daily.  . cyclobenzaprine (FLEXERIL) 10 MG tablet TAKE 1 TABLET BY MOUTH THREE TIMES DAILY AS NEEDED FOR MUSCLE SPASM  . dicyclomine (BENTYL) 20 MG tablet Take 1 tablet (20 mg total) by mouth 4  (four) times daily as needed for spasms.  . hydrochlorothiazide (HYDRODIURIL) 12.5 MG tablet Take 1 tablet (12.5 mg total) by mouth daily.  . metFORMIN (GLUCOPHAGE-XR) 500 MG 24 hr tablet TAKE 2 TABLETS BY MOUTH TWICE DAILY  . metoprolol succinate (TOPROL-XL) 25 MG 24 hr tablet TAKE 1 TABLET BY MOUTH ONCE DAILY WITH  OR  IMMEDIATELY  FOLLOWING  A  MEAL  . norethindrone (ORTHO MICRONOR) 0.35 MG tablet Take 1 tablet (0.35 mg total) by mouth daily.  Marland Kitchen omeprazole (PRILOSEC) 40 MG capsule Take 1 capsule (40 mg total) by mouth daily. 1 tablet a day  . podofilox (CONDYLOX) 0.5 % gel Apply topically 2 (two) times daily. (Patient taking differently: Apply topically as needed. )  . sertraline (ZOLOFT) 50 MG tablet Take 3 tablets (150 mg total) by mouth daily.  Marland Kitchen spironolactone (ALDACTONE) 100 MG tablet Take 1 tablet (100 mg total) by mouth daily.  . [DISCONTINUED] fluticasone (FLONASE) 50 MCG/ACT nasal spray Place 1 spray into both nostrils as needed.   Marland Kitchen amoxicillin-clavulanate (AUGMENTIN) 875-125 MG tablet Take 1 tablet by mouth 2 (two) times daily for 10 days.  . fluticasone (FLONASE) 50 MCG/ACT nasal spray Place 2 sprays into both nostrils daily.   No facility-administered encounter medications on file as of 01/20/2019.     No Known Allergies  Review of Systems  Constitutional: Negative for chills, fatigue and fever.  HENT: Positive for congestion, ear pain, rhinorrhea and sore throat. Negative for sinus pressure and sinus pain.   Respiratory: Positive for cough. Negative for chest tightness and shortness of breath.   Cardiovascular: Negative for chest pain and palpitations.  Musculoskeletal: Negative for myalgias.  Neurological: Negative for dizziness, weakness, light-headedness and headaches.  Psychiatric/Behavioral: Negative for confusion.  All other systems reviewed and are negative.       Objective:    BP 126/81   Pulse 98   Temp 98.2 F (36.8 C) (Oral)   Ht 5' 2"  (1.575 m)   Wt  (!) 332 lb (150.6 kg)   BMI 60.72 kg/m    Wt Readings from Last 3 Encounters:  01/20/19 (!) 332 lb (150.6 kg)  12/09/18 (!) 328 lb (148.8 kg)  12/06/18 (!) 326 lb 12.8 oz (148.2 kg)    Physical Exam Vitals signs and nursing note reviewed.  Constitutional:      Appearance: She is morbidly obese.  HENT:     Head: Normocephalic  and atraumatic.     Jaw: There is normal jaw occlusion.     Right Ear: Hearing, ear canal and external ear normal. Tympanic membrane is erythematous and bulging.     Left Ear: Hearing, ear canal and external ear normal. Tympanic membrane is erythematous and bulging.     Nose: Congestion and rhinorrhea present. Rhinorrhea is clear.     Right Turbinates: Swollen.     Left Turbinates: Swollen.     Right Sinus: No maxillary sinus tenderness or frontal sinus tenderness.     Left Sinus: No maxillary sinus tenderness or frontal sinus tenderness.     Mouth/Throat:     Lips: Pink.     Mouth: Mucous membranes are moist.     Pharynx: Uvula midline. Posterior oropharyngeal erythema present. No pharyngeal swelling, oropharyngeal exudate or uvula swelling.  Eyes:     General: Lids are normal.     Conjunctiva/sclera: Conjunctivae normal.     Pupils: Pupils are equal, round, and reactive to light.  Neck:     Musculoskeletal: Neck supple.     Thyroid: No thyroid mass, thyromegaly or thyroid tenderness.     Trachea: Trachea and phonation normal.  Cardiovascular:     Rate and Rhythm: Normal rate and regular rhythm.     Heart sounds: Normal heart sounds. No murmur. No friction rub. No gallop.   Pulmonary:     Effort: Pulmonary effort is normal.     Breath sounds: Normal breath sounds.  Lymphadenopathy:     Cervical: Cervical adenopathy present.  Skin:    General: Skin is warm and dry.     Capillary Refill: Capillary refill takes less than 2 seconds.  Neurological:     Mental Status: She is alert and oriented to person, place, and time.  Psychiatric:        Mood and  Affect: Mood normal.        Behavior: Behavior normal. Behavior is cooperative.        Thought Content: Thought content normal.        Judgment: Judgment normal.     Results for orders placed or performed in visit on 12/09/18  CBC with Differential/Platelet  Result Value Ref Range   WBC 13.4 (H) 3.4 - 10.8 x10E3/uL   RBC 5.10 3.77 - 5.28 x10E6/uL   Hemoglobin 14.3 11.1 - 15.9 g/dL   Hematocrit 43.6 34.0 - 46.6 %   MCV 86 79 - 97 fL   MCH 28.0 26.6 - 33.0 pg   MCHC 32.8 31.5 - 35.7 g/dL   RDW 13.4 12.3 - 15.4 %   Platelets 249 150 - 450 x10E3/uL   Neutrophils 63 Not Estab. %   Lymphs 28 Not Estab. %   Monocytes 5 Not Estab. %   Eos 2 Not Estab. %   Basos 1 Not Estab. %   Neutrophils Absolute 8.7 (H) 1.4 - 7.0 x10E3/uL   Lymphocytes Absolute 3.7 (H) 0.7 - 3.1 x10E3/uL   Monocytes Absolute 0.6 0.1 - 0.9 x10E3/uL   EOS (ABSOLUTE) 0.2 0.0 - 0.4 x10E3/uL   Basophils Absolute 0.1 0.0 - 0.2 x10E3/uL   Immature Granulocytes 1 Not Estab. %   Immature Grans (Abs) 0.1 0.0 - 0.1 x10E3/uL  Comprehensive metabolic panel  Result Value Ref Range   Glucose 76 65 - 99 mg/dL   BUN 10 6 - 20 mg/dL   Creatinine, Ser 0.68 0.57 - 1.00 mg/dL   GFR calc non Af Amer 115 >59 mL/min/1.73   GFR calc  Af Amer 133 >59 mL/min/1.73   BUN/Creatinine Ratio 15 9 - 23   Sodium 140 134 - 144 mmol/L   Potassium 4.2 3.5 - 5.2 mmol/L   Chloride 97 96 - 106 mmol/L   CO2 26 20 - 29 mmol/L   Calcium 9.7 8.7 - 10.2 mg/dL   Total Protein 7.0 6.0 - 8.5 g/dL   Albumin 4.4 3.5 - 5.5 g/dL   Globulin, Total 2.6 1.5 - 4.5 g/dL   Albumin/Globulin Ratio 1.7 1.2 - 2.2   Bilirubin Total 0.6 0.0 - 1.2 mg/dL   Alkaline Phosphatase 105 39 - 117 IU/L   AST 31 0 - 40 IU/L   ALT 50 (H) 0 - 32 IU/L  Ferritin  Result Value Ref Range   Ferritin 124 15 - 150 ng/mL  Hepatitis A antibody, total  Result Value Ref Range   Hep A Total Ab Negative Negative  Hepatitis B Surface AntiGEN  Result Value Ref Range   Hepatitis B Surface  Ag Negative Negative  Hepatitis B Core Antibody, total  Result Value Ref Range   Hep B Core Total Ab Negative Negative  Hepatitis B Surface AntiBODY  Result Value Ref Range   Hep B Surface Ab, Qual Non Reactive   Hepatitis c antibody (reflex)  Result Value Ref Range   HCV Ab <0.1 0.0 - 0.9 s/co ratio  Antinuclear Antib (ANA)  Result Value Ref Range   Anti Nuclear Antibody(ANA) Negative Negative  Mitochondrial Antibodies  Result Value Ref Range   Mitochondrial Ab 85.9 (H) 0.0 - 20.0 Units  Anti-Smooth Muscle Antibody, IGG  Result Value Ref Range   Smooth Muscle Ab 13 0 - 19 Units  HCV Comment:  Result Value Ref Range   Comment: Comment   Specimen status report  Result Value Ref Range   specimen status report Comment        Pertinent labs & imaging results that were available during my care of the patient were reviewed by me and considered in my medical decision making.  Assessment & Plan:  Heidi Landry was seen today for runny nose, bilateral ear pain, ears feel full, feels like f.  Diagnoses and all orders for this visit:  Non-recurrent acute suppurative otitis media of both ears without spontaneous rupture of tympanic membranes Try to avoid smoking and avoid second hand smoke. Tylenol or motrin as needed for fever and pain control. Medications as prescribed.  -     amoxicillin-clavulanate (AUGMENTIN) 875-125 MG tablet; Take 1 tablet by mouth 2 (two) times daily for 10 days. -     fluticasone (FLONASE) 50 MCG/ACT nasal spray; Place 2 sprays into both nostrils daily.  URI with cough and congestion Try to avoid smoking and avoid second hand smoke. Increase fluid intake and humidity in the air. Medications as prescribed. Report any new or worsening symptoms.  -     fluticasone (FLONASE) 50 MCG/ACT nasal spray; Place 2 sprays into both nostrils daily.    Continue all other maintenance medications.  Follow up plan: Return if symptoms worsen or fail to improve.  Educational  handout given for otitis media  The above assessment and management plan was discussed with the patient. The patient verbalized understanding of and has agreed to the management plan. Patient is aware to call the clinic if symptoms persist or worsen. Patient is aware when to return to the clinic for a follow-up visit. Patient educated on when it is appropriate to go to the emergency department.   Monia Pouch,  FNP-C [place] [phone]

## 2019-01-28 ENCOUNTER — Other Ambulatory Visit: Payer: Self-pay | Admitting: Obstetrics & Gynecology

## 2019-01-28 ENCOUNTER — Telehealth: Payer: Self-pay | Admitting: *Deleted

## 2019-01-28 MED ORDER — NORETHINDRONE 0.35 MG PO TABS: 1.0000 | ORAL_TABLET | Freq: Every day | ORAL | 12 refills | Status: DC

## 2019-01-28 NOTE — Telephone Encounter (Signed)
Pt requests refill on birth control pill. Advised that I will send her request to a provider. Advised that she will need an appt for well woman exam as it has been 2 years since her last. Pt states that she made an appt for the end of February. Advised to check with her pharmacy later today.

## 2019-01-29 HISTORY — PX: KNEE SURGERY: SHX244

## 2019-02-01 ENCOUNTER — Ambulatory Visit: Payer: Medicaid Other | Admitting: Cardiology

## 2019-02-01 NOTE — Progress Notes (Deleted)
Clinical Summary Heidi Landry is a 33 y.o.female seen today for follow up of the following medical problems.    1. Palpitations - 48 hr holter with no symptoms reported and no arrhythmias - denies any recent palpitations. She has done well since starting beta blocker and limiting caffeine intake  2. OSA  - compliant with CPAP  3. Weight gain - weights Jan 2017 was 320s, in Feb 310s-320. Today we have her at 343 lbs.  - has had some increased leg swelling. Notes some recent SOB/DOE. - just started on lasix by pcp   - she does report some poor eating habits as well    SH: last visit she was pregnant, she had a litlle boy in December named Heidi Landry.    Past Medical History:  Diagnosis Date  . Anxiety   . Atrial fibrillation ([place])   . Carpal tunnel syndrome, bilateral   . Chest pain   . Chronic back pain   . Degenerative disc disease   . Edema   . Gestational diabetes mellitus, antepartum   . Hypertension   . IBS (irritable bowel syndrome)   . Panic attacks   . Post partum depression   . Sleep apnea   . Spondylosis   . Tendonitis      No Known Allergies   Current Outpatient Medications  Medication Sig Dispense Refill  . ACCU-CHEK AVIVA PLUS test strip USE TO CHECK BLOOD GLUCOSE ONCE DAILY OR EVERY OTHER DAY 100 each 1  . ACCU-CHEK FASTCLIX LANCETS MISC 1 each daily by Does not apply route. Use to check BG four times daily 408 each 0  . albuterol (PROVENTIL HFA;VENTOLIN HFA) 108 (90 Base) MCG/ACT inhaler Inhale 2 puffs into the lungs every 6 (six) hours as needed for wheezing or shortness of breath. 1 Inhaler 5  . bevacizumab (AVASTIN) 400 MG/16ML SOLN Inject into the vein once. Every 8 weeks injected into right eye.    Marland Kitchen buPROPion (WELLBUTRIN SR) 150 MG 12 hr tablet TAKE 1 TABLET BY MOUTH ONCE DAILY FOR  THE  FIRST  THREE  DAYS  THEN  TAKE  1  TABLET  TWICE  DAILY 180 tablet 0  . BYDUREON 2 MG PEN INJECT 2MG INTO THE SKIN ONCE A WEEK. 12 each 1  .  cetirizine (ZYRTEC) 10 MG tablet Take 1 tablet (10 mg total) by mouth daily. 90 tablet 1  . cyclobenzaprine (FLEXERIL) 10 MG tablet TAKE 1 TABLET BY MOUTH THREE TIMES DAILY AS NEEDED FOR MUSCLE SPASM 30 tablet 2  . dicyclomine (BENTYL) 20 MG tablet Take 1 tablet (20 mg total) by mouth 4 (four) times daily as needed for spasms. 60 tablet 3  . fluticasone (FLONASE) 50 MCG/ACT nasal spray Place 2 sprays into both nostrils daily. 16 g 6  . hydrochlorothiazide (HYDRODIURIL) 12.5 MG tablet Take 1 tablet (12.5 mg total) by mouth daily. 90 tablet 1  . metFORMIN (GLUCOPHAGE-XR) 500 MG 24 hr tablet TAKE 2 TABLETS BY MOUTH TWICE DAILY 360 tablet 0  . metoprolol succinate (TOPROL-XL) 25 MG 24 hr tablet TAKE 1 TABLET BY MOUTH ONCE DAILY WITH  OR  IMMEDIATELY  FOLLOWING  A  MEAL 90 tablet 1  . norethindrone (ORTHO MICRONOR) 0.35 MG tablet Take 1 tablet (0.35 mg total) by mouth daily. 1 Package 12  . omeprazole (PRILOSEC) 40 MG capsule Take 1 capsule (40 mg total) by mouth daily. 1 tablet a day 90 capsule 1  . podofilox (CONDYLOX) 0.5 % gel Apply  topically 2 (two) times daily. (Patient taking differently: Apply topically as needed. ) 3.5 g 11  . sertraline (ZOLOFT) 50 MG tablet Take 3 tablets (150 mg total) by mouth daily. 270 tablet 1  . spironolactone (ALDACTONE) 100 MG tablet Take 1 tablet (100 mg total) by mouth daily. 90 tablet 1   No current facility-administered medications for this visit.      Past Surgical History:  Procedure Laterality Date  . CESAREAN SECTION N/A 12/08/2015   Procedure: CESAREAN SECTION;  Surgeon: Jonnie Kind, MD;  Location: [facility];  Service: Obstetrics;  Laterality: N/A;  . CHOLECYSTECTOMY       No Known Allergies    Family History  Problem Relation Age of Onset  . Fibromyalgia Mother   . Hernia Father   . Lung cancer Father   . Colon cancer Father 32       partial colectomy  . Diabetes Maternal Grandmother   . Hypertension Maternal Grandmother   . Cancer  Paternal Uncle   . Brain cancer Paternal Uncle   . Throat cancer Paternal Uncle   . Thyroid disease Paternal Aunt      Social History Ms. Kouba reports that she has been smoking cigarettes. She started smoking about 12 years ago. She has a 4.50 pack-year smoking history. She has never used smokeless tobacco. Ms. Filip reports no history of alcohol use.   Review of Systems CONSTITUTIONAL: No weight loss, fever, chills, weakness or fatigue.  HEENT: Eyes: No visual loss, blurred vision, double vision or yellow sclerae.No hearing loss, sneezing, congestion, runny nose or sore throat.  SKIN: No rash or itching.  CARDIOVASCULAR:  RESPIRATORY: No shortness of breath, cough or sputum.  GASTROINTESTINAL: No anorexia, nausea, vomiting or diarrhea. No abdominal pain or blood.  GENITOURINARY: No burning on urination, no polyuria NEUROLOGICAL: No headache, dizziness, syncope, paralysis, ataxia, numbness or tingling in the extremities. No change in bowel or bladder control.  MUSCULOSKELETAL: No muscle, back pain, joint pain or stiffness.  LYMPHATICS: No enlarged nodes. No history of splenectomy.  PSYCHIATRIC: No history of depression or anxiety.  ENDOCRINOLOGIC: No reports of sweating, cold or heat intolerance. No polyuria or polydipsia.  Marland Kitchen   Physical Examination There were no vitals filed for this visit. There were no vitals filed for this visit.  Gen: resting comfortably, no acute distress HEENT: no scleral icterus, pupils equal round and reactive, no palptable cervical adenopathy,  CV Resp: Clear to auscultation bilaterally GI: abdomen is soft, non-tender, non-distended, normal bowel sounds, no hepatosplenomegaly MSK: extremities are warm, no edema.  Skin: warm, no rash Neuro:  no focal deficits Psych: appropriate affect   Diagnostic Studies 10/2015 Sleep study: severe OSA  02/2015 48 hr holter No arrhythmias, no symptoms reported     Assessment and Plan  1.  Palpitations - no significant arrhythmias on holter - symptoms improved on beta blocker adn limited caffeine - continue to monitor  2. OSA  - continue CPAP   3. LE edema - significant weight gain and edema over the last few months. We will obtain echo to evaluate for any cardiac dysfunction       Arnoldo Lenis, M.D.

## 2019-02-16 ENCOUNTER — Encounter: Payer: Self-pay | Admitting: Family Medicine

## 2019-02-16 ENCOUNTER — Telehealth: Payer: Self-pay | Admitting: Orthopedic Surgery

## 2019-02-16 ENCOUNTER — Telehealth: Payer: Self-pay | Admitting: Family Medicine

## 2019-02-16 ENCOUNTER — Ambulatory Visit: Payer: Medicaid Other | Admitting: Family Medicine

## 2019-02-16 VITALS — BP 125/74 | HR 98 | Temp 99.1°F

## 2019-02-16 DIAGNOSIS — M545 Low back pain, unspecified: Secondary | ICD-10-CM

## 2019-02-16 DIAGNOSIS — Z09 Encounter for follow-up examination after completed treatment for conditions other than malignant neoplasm: Secondary | ICD-10-CM | POA: Diagnosis not present

## 2019-02-16 DIAGNOSIS — D500 Iron deficiency anemia secondary to blood loss (chronic): Secondary | ICD-10-CM

## 2019-02-16 LAB — CBC WITH DIFFERENTIAL/PLATELET
Basophils Absolute: 0.1 10*3/uL (ref 0.0–0.2)
Basos: 1 %
EOS (ABSOLUTE): 0.3 10*3/uL (ref 0.0–0.4)
Eos: 2 %
HEMATOCRIT: 28.7 % — AB (ref 34.0–46.6)
Hemoglobin: 9.2 g/dL — ABNORMAL LOW (ref 11.1–15.9)
Immature Grans (Abs): 0.3 10*3/uL — ABNORMAL HIGH (ref 0.0–0.1)
Immature Granulocytes: 2 %
LYMPHS: 26 %
Lymphocytes Absolute: 3.5 10*3/uL — ABNORMAL HIGH (ref 0.7–3.1)
MCH: 27.7 pg (ref 26.6–33.0)
MCHC: 32.1 g/dL (ref 31.5–35.7)
MCV: 86 fL (ref 79–97)
Monocytes Absolute: 0.6 10*3/uL (ref 0.1–0.9)
Monocytes: 5 %
NRBC: 1 % — ABNORMAL HIGH (ref 0–0)
Neutrophils Absolute: 8.5 10*3/uL — ABNORMAL HIGH (ref 1.4–7.0)
Neutrophils: 64 %
Platelets: 345 10*3/uL (ref 150–450)
RBC: 3.32 x10E6/uL — ABNORMAL LOW (ref 3.77–5.28)
RDW: 14.1 % (ref 11.7–15.4)
WBC: 13.3 10*3/uL — ABNORMAL HIGH (ref 3.4–10.8)

## 2019-02-16 MED ORDER — CYCLOBENZAPRINE HCL 10 MG PO TABS: 10.0000 mg | ORAL_TABLET | Freq: Three times a day (TID) | ORAL | 2 refills | Status: DC | PRN

## 2019-02-16 NOTE — Telephone Encounter (Signed)
tues afternoon 130  Must come then

## 2019-02-16 NOTE — Progress Notes (Signed)
Subjective:  Patient ID: Heidi Landry, female    DOB: 30-Aug-1986, 33 y.o.   MRN: [id number]  Chief Complaint:  Hospitalization Follow-up   HPI: Heidi Landry is a 33 y.o. female presenting on 02/16/2019 for Hospitalization Follow-up   1. Surgical follow-up care  Pt presents today for hospital discharge follow up. Pt was visiting her mother in [place] and fell through a floor severely injuring her right leg. Pt states this happened on 02/04/2019. States she was rushed into emergency surgery due to bleeding, ligament and tendon damage. Pt states she had to received 4 units of blood during the course of her hospital stay. Pt is in a knee immobilizer and had a dressing covering wound. Pt states she has been doing wound care twice daily. States she was not prescribed outpatient antibiotics but did receive them while in the hospital. Pt states she was told to follow up with her PCP and they should schedule follow up with providers in this area. Approximate 30 cm laceration / avulsion to right knee and leg. Partial records received. Requested additional records.  Surgical note indicated partially transected quadriceps tendon and an avulsed patellar tendon. Arthrotomy of knee joint was noted. Orthopedic surgeon repaired the joint capsule and tendons.   Pt was discharged on 02/09/2019 and was supposed to see the surgeon 2 weeks after discharge. No mention of staple removal or follow up otherwise noted in records.  Hgb at discharge was 7.4    Relevant past medical, surgical, family, and social history reviewed and updated as indicated.  Allergies and medications reviewed and updated.   Past Medical History:  Diagnosis Date  . Anxiety   . Atrial fibrillation ([place])   . Carpal tunnel syndrome, bilateral   . Chest pain   . Chronic back pain   . Degenerative disc disease   . Edema   . Gestational diabetes mellitus, antepartum   . Hypertension   . IBS (irritable bowel syndrome)   . Panic  attacks   . Post partum depression   . Sleep apnea   . Spondylosis   . Tendonitis     Past Surgical History:  Procedure Laterality Date  . CESAREAN SECTION N/A 12/08/2015   Procedure: CESAREAN SECTION;  Surgeon: Jonnie Kind, MD;  Location: [facility];  Service: Obstetrics;  Laterality: N/A;  . CHOLECYSTECTOMY      Social History   Socioeconomic History  . Marital status: Married    Spouse name: Not on file  . Number of children: Not on file  . Years of education: 4  . Highest education level: Not on file  Occupational History    Employer: [employer]  Social Needs  . Financial resource strain: Not on file  . Food insecurity:    Worry: Not on file    Inability: Not on file  . Transportation needs:    Medical: Not on file    Non-medical: Not on file  Tobacco Use  . Smoking status: Current Every Day Smoker    Packs/day: 0.50    Years: 9.00    Pack years: 4.50    Types: Cigarettes    Start date: 05/25/2006  . Smokeless tobacco: Never Used  Substance and Sexual Activity  . Alcohol use: No    Alcohol/week: 0.0 standard drinks  . Drug use: No  . Sexual activity: Yes    Birth control/protection: Pill  Lifestyle  . Physical activity:    Days per week: Not on file  Minutes per session: Not on file  . Stress: Not on file  Relationships  . Social connections:    Talks on phone: Not on file    Gets together: Not on file    Attends religious service: Not on file    Active member of club or organization: Not on file    Attends meetings of clubs or organizations: Not on file    Relationship status: Not on file  . Intimate partner violence:    Fear of current or ex partner: Not on file    Emotionally abused: Not on file    Physically abused: Not on file    Forced sexual activity: Not on file  Other Topics Concern  . Not on file  Social History Narrative  . Not on file    Outpatient Encounter Medications as of 02/16/2019  Medication Sig  . ACCU-CHEK  AVIVA PLUS test strip USE TO CHECK BLOOD GLUCOSE ONCE DAILY OR EVERY OTHER DAY  . ACCU-CHEK FASTCLIX LANCETS MISC 1 each daily by Does not apply route. Use to check BG four times daily  . albuterol (PROVENTIL HFA;VENTOLIN HFA) 108 (90 Base) MCG/ACT inhaler Inhale 2 puffs into the lungs every 6 (six) hours as needed for wheezing or shortness of breath.  . bevacizumab (AVASTIN) 400 MG/16ML SOLN Inject into the vein once. Every 8 weeks injected into right eye.  Marland Kitchen buPROPion (WELLBUTRIN SR) 150 MG 12 hr tablet TAKE 1 TABLET BY MOUTH ONCE DAILY FOR  THE  FIRST  THREE  DAYS  THEN  TAKE  1  TABLET  TWICE  DAILY (Patient taking differently: 150 mg. TAKE 1 TABLET BY MOUTH ONCE DAILY FOR  THE  FIRST  THREE  DAYS  THEN  TAKE  1  TABLET  TWICE  DAILY)  . BYDUREON 2 MG PEN INJECT 2MG INTO THE SKIN ONCE A WEEK.  . cetirizine (ZYRTEC) 10 MG tablet Take 1 tablet (10 mg total) by mouth daily.  . cyclobenzaprine (FLEXERIL) 10 MG tablet Take 1 tablet (10 mg total) by mouth 3 (three) times daily as needed. for muscle spams  . dicyclomine (BENTYL) 20 MG tablet Take 1 tablet (20 mg total) by mouth 4 (four) times daily as needed for spasms.  . fluticasone (FLONASE) 50 MCG/ACT nasal spray Place 2 sprays into both nostrils daily.  Marland Kitchen gabapentin (NEURONTIN) 300 MG capsule Take 300 mg by mouth 3 (three) times daily.  . hydrochlorothiazide (HYDRODIURIL) 12.5 MG tablet Take 1 tablet (12.5 mg total) by mouth daily.  . metFORMIN (GLUCOPHAGE-XR) 500 MG 24 hr tablet TAKE 2 TABLETS BY MOUTH TWICE DAILY  . metoprolol succinate (TOPROL-XL) 25 MG 24 hr tablet TAKE 1 TABLET BY MOUTH ONCE DAILY WITH  OR  IMMEDIATELY  FOLLOWING  A  MEAL  . norethindrone (ORTHO MICRONOR) 0.35 MG tablet Take 1 tablet (0.35 mg total) by mouth daily.  Marland Kitchen omeprazole (PRILOSEC) 40 MG capsule Take 1 capsule (40 mg total) by mouth daily. 1 tablet a day  . oxyCODONE-acetaminophen (PERCOCET/ROXICET) 5-325 MG tablet Take 1 tablet by mouth every 4 (four) hours as needed.  for pain  . podofilox (CONDYLOX) 0.5 % gel Apply topically 2 (two) times daily. (Patient taking differently: Apply topically as needed. )  . sertraline (ZOLOFT) 50 MG tablet Take 3 tablets (150 mg total) by mouth daily.  Marland Kitchen spironolactone (ALDACTONE) 100 MG tablet Take 1 tablet (100 mg total) by mouth daily.  . [DISCONTINUED] cyclobenzaprine (FLEXERIL) 10 MG tablet TAKE 1 TABLET BY MOUTH THREE  TIMES DAILY AS NEEDED FOR MUSCLE SPASM   No facility-administered encounter medications on file as of 02/16/2019.     No Known Allergies  Review of Systems  Constitutional: Positive for fatigue. Negative for chills and fever.  Respiratory: Negative for cough, chest tightness and shortness of breath.   Cardiovascular: Positive for leg swelling (right). Negative for chest pain and palpitations.  Endocrine: Negative for polydipsia, polyphagia and polyuria.  Genitourinary: Negative for decreased urine volume and difficulty urinating.  Musculoskeletal: Positive for arthralgias (low back pain) and myalgias.  Skin: Positive for color change (around wound to right leg) and wound.  Neurological: Positive for numbness (around wound to right leg). Negative for dizziness, tremors, seizures, syncope, facial asymmetry, speech difficulty, weakness, light-headedness and headaches.  Psychiatric/Behavioral: Negative for confusion.  All other systems reviewed and are negative.       Objective:  BP 125/74   Pulse 98   Temp 99.1 F (37.3 C) (Oral)    Wt Readings from Last 3 Encounters:  01/20/19 (!) 332 lb (150.6 kg)  12/09/18 (!) 328 lb (148.8 kg)  12/06/18 (!) 326 lb 12.8 oz (148.2 kg)    Physical Exam Vitals signs and nursing note reviewed.  Constitutional:      General: She is not in acute distress.    Appearance: Normal appearance. She is not ill-appearing or toxic-appearing.  HENT:     Head: Normocephalic and atraumatic.  Eyes:     Conjunctiva/sclera: Conjunctivae normal.     Pupils: Pupils are  equal, round, and reactive to light.  Neck:     Musculoskeletal: Normal range of motion and neck supple.  Cardiovascular:     Rate and Rhythm: Normal rate and regular rhythm.     Pulses: Normal pulses.     Heart sounds: Normal heart sounds. No murmur. No friction rub. No gallop.   Pulmonary:     Effort: Pulmonary effort is normal. No respiratory distress.     Breath sounds: Normal breath sounds.  Musculoskeletal:       Legs:     Comments: Staples remain intact. No drainage or erythema around wound. Area of concern as noted.  Skin:    General: Skin is warm and dry.     Capillary Refill: Capillary refill takes less than 2 seconds.     Coloration: Skin is not pale.  Neurological:     General: No focal deficit present.     Mental Status: She is alert and oriented to person, place, and time.  Psychiatric:        Mood and Affect: Mood normal.        Behavior: Behavior normal.        Thought Content: Thought content normal.        Judgment: Judgment normal.        Results for orders placed or performed in visit on 12/09/18  CBC with Differential/Platelet  Result Value Ref Range   WBC 13.4 (H) 3.4 - 10.8 x10E3/uL   RBC 5.10 3.77 - 5.28 x10E6/uL   Hemoglobin 14.3 11.1 - 15.9 g/dL   Hematocrit 43.6 34.0 - 46.6 %   MCV 86 79 - 97 fL   MCH 28.0 26.6 - 33.0 pg   MCHC 32.8 31.5 - 35.7 g/dL   RDW 13.4 12.3 - 15.4 %   Platelets 249 150 - 450 x10E3/uL   Neutrophils 63 Not Estab. %   Lymphs 28 Not Estab. %   Monocytes 5 Not Estab. %   Eos 2 Not  Estab. %   Basos 1 Not Estab. %   Neutrophils Absolute 8.7 (H) 1.4 - 7.0 x10E3/uL   Lymphocytes Absolute 3.7 (H) 0.7 - 3.1 x10E3/uL   Monocytes Absolute 0.6 0.1 - 0.9 x10E3/uL   EOS (ABSOLUTE) 0.2 0.0 - 0.4 x10E3/uL   Basophils Absolute 0.1 0.0 - 0.2 x10E3/uL   Immature Granulocytes 1 Not Estab. %   Immature Grans (Abs) 0.1 0.0 - 0.1 x10E3/uL  Comprehensive metabolic panel  Result Value Ref Range   Glucose 76 65 - 99 mg/dL   BUN 10 6 -  20 mg/dL   Creatinine, Ser 0.68 0.57 - 1.00 mg/dL   GFR calc non Af Amer 115 >59 mL/min/1.73   GFR calc Af Amer 133 >59 mL/min/1.73   BUN/Creatinine Ratio 15 9 - 23   Sodium 140 134 - 144 mmol/L   Potassium 4.2 3.5 - 5.2 mmol/L   Chloride 97 96 - 106 mmol/L   CO2 26 20 - 29 mmol/L   Calcium 9.7 8.7 - 10.2 mg/dL   Total Protein 7.0 6.0 - 8.5 g/dL   Albumin 4.4 3.5 - 5.5 g/dL   Globulin, Total 2.6 1.5 - 4.5 g/dL   Albumin/Globulin Ratio 1.7 1.2 - 2.2   Bilirubin Total 0.6 0.0 - 1.2 mg/dL   Alkaline Phosphatase 105 39 - 117 IU/L   AST 31 0 - 40 IU/L   ALT 50 (H) 0 - 32 IU/L  Ferritin  Result Value Ref Range   Ferritin 124 15 - 150 ng/mL  Hepatitis A antibody, total  Result Value Ref Range   Hep A Total Ab Negative Negative  Hepatitis B Surface AntiGEN  Result Value Ref Range   Hepatitis B Surface Ag Negative Negative  Hepatitis B Core Antibody, total  Result Value Ref Range   Hep B Core Total Ab Negative Negative  Hepatitis B Surface AntiBODY  Result Value Ref Range   Hep B Surface Ab, Qual Non Reactive   Hepatitis c antibody (reflex)  Result Value Ref Range   HCV Ab <0.1 0.0 - 0.9 s/co ratio  Antinuclear Antib (ANA)  Result Value Ref Range   Anti Nuclear Antibody(ANA) Negative Negative  Mitochondrial Antibodies  Result Value Ref Range   Mitochondrial Ab 85.9 (H) 0.0 - 20.0 Units  Anti-Smooth Muscle Antibody, IGG  Result Value Ref Range   Smooth Muscle Ab 13 0 - 19 Units  HCV Comment:  Result Value Ref Range   Comment: Comment   Specimen status report  Result Value Ref Range   specimen status report Comment        Pertinent labs & imaging results that were available during my care of the patient were reviewed by me and considered in my medical decision making.  Assessment & Plan:  Damyah was seen today for hospitalization follow-up.  Diagnoses and all orders for this visit:  Surgical follow-up care Additional records requested from [facility] in  [place]. Urgent referral to orthopedic surgery and wound care made.  -     Ambulatory referral to Orthopedic Surgery -     AMB referral to wound care center  Midline low back pain without sciatica, unspecified chronicity Can trial Flexeril. Report any new or worsening symptoms.  -     cyclobenzaprine (FLEXERIL) 10 MG tablet; Take 1 tablet (10 mg total) by mouth 3 (three) times daily as needed. for muscle spams  Anemia due to blood loss Labs pending. Report any chest pain, palpitations, dizziness, syncope, or weakness.  -  CBC with Differential/Platelet     Continue all other maintenance medications.  Follow up plan: Return in about 1 week (around 02/23/2019), or if symptoms worsen or fail to improve.  Educational handout given for wound care  The above assessment and management plan was discussed with the patient. The patient verbalized understanding of and has agreed to the management plan. Patient is aware to call the clinic if symptoms persist or worsen. Patient is aware when to return to the clinic for a follow-up visit. Patient educated on when it is appropriate to go to the emergency department.   Monia Pouch, FNP-C [facility] [phone]

## 2019-02-16 NOTE — Telephone Encounter (Signed)
Patient has called back and appointment is scheduled accordingly, per notes in referral Workqueue.

## 2019-02-16 NOTE — Telephone Encounter (Signed)
Pt aware.

## 2019-02-16 NOTE — Patient Instructions (Signed)
Wound Care, Adult  Taking care of your wound properly can help to prevent pain, infection, and scarring. It can also help your wound to heal more quickly.  How to care for your wound  Wound care          Follow instructions from your health care provider about how to take care of your wound. Make sure you:  ? Wash your hands with soap and water before you change the bandage (dressing). If soap and water are not available, use hand sanitizer.  ? Change your dressing as told by your health care provider.  ? Leave stitches (sutures), skin glue, or adhesive strips in place. These skin closures may need to stay in place for 2 weeks or longer. If adhesive strip edges start to loosen and curl up, you may trim the loose edges. Do not remove adhesive strips completely unless your health care provider tells you to do that.   Check your wound area every day for signs of infection. Check for:  ? Redness, swelling, or pain.  ? Fluid or blood.  ? Warmth.  ? Pus or a bad smell.   Ask your health care provider if you should clean the wound with mild soap and water. Doing this may include:  ? Using a clean towel to pat the wound dry after cleaning it. Do not rub or scrub the wound.  ? Applying a cream or ointment. Do this only as told by your health care provider.  ? Covering the incision with a clean dressing.   Ask your health care provider when you can leave the wound uncovered.   Keep the dressing dry until your health care provider says it can be removed. Do not take baths, swim, use a hot tub, or do anything that would put the wound underwater until your health care provider approves. Ask your health care provider if you can take showers. You may only be allowed to take sponge baths.  Medicines     If you were prescribed an antibiotic medicine, cream, or ointment, take or use the antibiotic as told by your health care provider. Do not stop taking or using the antibiotic even if your condition improves.   Take  over-the-counter and prescription medicines only as told by your health care provider. If you were prescribed pain medicine, take it 30 or more minutes before you do any wound care or as told by your health care provider.  General instructions   Return to your normal activities as told by your health care provider. Ask your health care provider what activities are safe.   Do not scratch or pick at the wound.   Do not use any products that contain nicotine or tobacco, such as cigarettes and e-cigarettes. These may delay wound healing. If you need help quitting, ask your health care provider.   Keep all follow-up visits as told by your health care provider. This is important.   Eat a diet that includes protein, vitamin A, vitamin C, and other nutrient-rich foods to help the wound heal.  ? Foods rich in protein include meat, dairy, beans, nuts, and other sources.  ? Foods rich in vitamin A include carrots and dark green, leafy vegetables.  ? Foods rich in vitamin C include citrus, tomatoes, and other fruits and vegetables.  ? Nutrient-rich foods have protein, carbohydrates, fat, vitamins, or minerals. Eat a variety of healthy foods including vegetables, fruits, and whole grains.  Contact a health care provider if:     You received a tetanus shot and you have swelling, severe pain, redness, or bleeding at the injection site.   Your pain is not controlled with medicine.   You have redness, swelling, or pain around the wound.   You have fluid or blood coming from the wound.   Your wound feels warm to the touch.   You have pus or a bad smell coming from the wound.   You have a fever or chills.   You are nauseous or you vomit.   You are dizzy.  Get help right away if:   You have a red streak going away from your wound.   The edges of the wound open up and separate.   Your wound is bleeding, and the bleeding does not stop with gentle pressure.   You have a rash.   You faint.   You have trouble  breathing.  Summary   Always wash your hands with soap and water before changing your bandage (dressing).   To help with healing, eat foods that are rich in protein, vitamin A, vitamin C, and other nutrients.   Check your wound every day for signs of infection. Contact your health care provider if you suspect that your wound is infected.  This information is not intended to replace advice given to you by your health care provider. Make sure you discuss any questions you have with your health care provider.  Document Released: 09/23/2008 Document Revised: 01/26/2018 Document Reviewed: 07/01/2016  Elsevier Interactive Patient Education  2019 [employer]

## 2019-02-16 NOTE — Telephone Encounter (Signed)
Please see notes and "urgent" referral received today from [facility].  Patient relays the following information - please advise:  "Called patient re: urgent referral for surgical fol/up. Patient said she had emergency surgery on 02/04/2019, in [place], [facility], for trauma to right knee, due to a fall "through ceramic tile floor." Michela Pitcher she had been transported to this facility by EMS.  Said she had a deep laceration and also"tendon pulled away from bone, and had to be re-attached to the bone" *States she still has the staples in her knee. States she had requested records (op notes,films) however has not yet received.

## 2019-02-16 NOTE — Telephone Encounter (Signed)
Called patient per Dr Ruthe Mannan response regarding appointment. Left voice mail message to return call.

## 2019-02-16 NOTE — Telephone Encounter (Signed)
Yes, that will be fine. That is the earliest appointment we could get. Tell her is she has problems in the meantime to let me know.

## 2019-02-17 ENCOUNTER — Encounter: Payer: Self-pay | Admitting: Pediatrics

## 2019-02-17 ENCOUNTER — Telehealth: Payer: Self-pay | Admitting: Obstetrics & Gynecology

## 2019-02-17 MED ORDER — NORETHINDRONE 0.35 MG PO TABS: 1.0000 | ORAL_TABLET | Freq: Every day | ORAL | 1 refills | Status: DC

## 2019-02-17 MED ORDER — FERROUS SULFATE 325 (65 FE) MG PO TBEC: 325.0000 mg | DELAYED_RELEASE_TABLET | Freq: Three times a day (TID) | ORAL | 0 refills | Status: DC

## 2019-02-17 NOTE — Telephone Encounter (Signed)
Patient called, had a P&P scheduled for 02/22/19, but fell and injured her leg really bad.  She is requesting bc to last her until she can come in.  She'd like a call back.  [facility] [place]  [phone]

## 2019-02-17 NOTE — Telephone Encounter (Signed)
Meds ordered this encounter  Medications  . norethindrone (ORTHO MICRONOR) 0.35 MG tablet    Sig: Take 1 tablet (0.35 mg total) by mouth daily.    Dispense:  1 Package    Refill:  1

## 2019-02-17 NOTE — Telephone Encounter (Signed)
Pt requests that a refill be sent on birth control until she can come in for p/p. LMOVM that I would send her request to a provider and she could check with her pharmacy later today.

## 2019-02-17 NOTE — Addendum Note (Signed)
Addended by: Baruch Gouty on: 02/17/2019 08:11 AM   Modules accepted: Orders

## 2019-02-17 NOTE — Telephone Encounter (Signed)
done

## 2019-02-21 ENCOUNTER — Telehealth: Payer: Self-pay | Admitting: Family Medicine

## 2019-02-21 ENCOUNTER — Other Ambulatory Visit: Payer: Self-pay

## 2019-02-21 ENCOUNTER — Telehealth: Payer: Self-pay | Admitting: Radiology

## 2019-02-21 DIAGNOSIS — Z09 Encounter for follow-up examination after completed treatment for conditions other than malignant neoplasm: Secondary | ICD-10-CM

## 2019-02-21 NOTE — Telephone Encounter (Signed)
Patient states one of the metal parts of the knee immobilizer is coming out of her brace, and poking her in the abdomen and her private areas. I have advised her to remove the piece of metal and to make sure she is not bending her knee, and to make sure the immobilizer is high enough not to allow her to bend, and we will look at what she has when she comes in tomorrow  She voiced understanding.

## 2019-02-21 NOTE — Telephone Encounter (Signed)
Please refer as pt requests

## 2019-02-22 ENCOUNTER — Encounter: Payer: Self-pay | Admitting: Orthopedic Surgery

## 2019-02-22 ENCOUNTER — Other Ambulatory Visit: Payer: Medicaid Other | Admitting: Obstetrics & Gynecology

## 2019-02-22 ENCOUNTER — Ambulatory Visit: Payer: Medicaid Other | Admitting: Orthopedic Surgery

## 2019-02-22 VITALS — BP 164/96 | HR 94 | Ht 62.0 in

## 2019-02-22 DIAGNOSIS — S86811A Strain of other muscle(s) and tendon(s) at lower leg level, right leg, initial encounter: Secondary | ICD-10-CM

## 2019-02-22 MED ORDER — HYDROCODONE-ACETAMINOPHEN 7.5-325 MG PO TABS: 1.0000 | ORAL_TABLET | ORAL | 0 refills | Status: DC | PRN

## 2019-02-22 MED ORDER — HYDROCODONE-ACETAMINOPHEN 7.5-325 MG PO TABS: 1.0000 | ORAL_TABLET | ORAL | 0 refills | Status: AC | PRN

## 2019-02-22 NOTE — Addendum Note (Signed)
Addended by: Carole Civil on: 02/22/2019 02:51 PM   Modules accepted: Orders

## 2019-02-22 NOTE — Progress Notes (Addendum)
NEW PATIENT OFFICE VISIT  Chief Complaint  Patient presents with  . Knee Injury    right knee laceration had surgery and 4 blood transfusions has been told no flexion for 6 week s    Right knee injury right knee pain Duration 18 days, injury date February 7 Pain currently under control Initial 10 out of 10 pain Patient having difficulty with current immobilizer rubbing her skin   33 year old female was operated on in [place] referral came in from primary care for further management.  She fell through a tile floor sustained a horseshoe-like laceration around her right knee area.  She says she had ligament and tendon damage which required surgery.  No notes available.  She was told she could not bend her knee for 6 weeks     Review of Systems  Constitutional: Negative for chills, fever and malaise/fatigue.  Neurological: Negative for tingling and sensory change.     Past Medical History:  Diagnosis Date  . Anxiety   . Atrial fibrillation ([place])   . Carpal tunnel syndrome, bilateral   . Chest pain   . Chronic back pain   . Degenerative disc disease   . Edema   . Gestational diabetes mellitus, antepartum   . Hypertension   . IBS (irritable bowel syndrome)   . Panic attacks   . Post partum depression   . Sleep apnea   . Spondylosis   . Tendonitis     Past Surgical History:  Procedure Laterality Date  . CESAREAN SECTION N/A 12/08/2015   Procedure: CESAREAN SECTION;  Surgeon: Jonnie Kind, MD;  Location: [facility];  Service: Obstetrics;  Laterality: N/A;  . CHOLECYSTECTOMY      Family History  Problem Relation Age of Onset  . Fibromyalgia Mother   . Hernia Father   . Lung cancer Father   . Colon cancer Father 27       partial colectomy  . Diabetes Maternal Grandmother   . Hypertension Maternal Grandmother   . Cancer Paternal Uncle   . Brain cancer Paternal Uncle   . Throat cancer Paternal Uncle   . Thyroid disease Paternal Aunt    Social History   Tobacco  Use  . Smoking status: Current Every Day Smoker    Packs/day: 0.50    Years: 9.00    Pack years: 4.50    Types: Cigarettes    Start date: 05/25/2006  . Smokeless tobacco: Never Used  Substance Use Topics  . Alcohol use: No    Alcohol/week: 0.0 standard drinks  . Drug use: No    No Known Allergies  Current Meds  Medication Sig  . ACCU-CHEK AVIVA PLUS test strip USE TO CHECK BLOOD GLUCOSE ONCE DAILY OR EVERY OTHER DAY  . ACCU-CHEK FASTCLIX LANCETS MISC 1 each daily by Does not apply route. Use to check BG four times daily  . albuterol (PROVENTIL HFA;VENTOLIN HFA) 108 (90 Base) MCG/ACT inhaler Inhale 2 puffs into the lungs every 6 (six) hours as needed for wheezing or shortness of breath.  . bevacizumab (AVASTIN) 400 MG/16ML SOLN Inject into the vein once. Every 8 weeks injected into right eye.  Marland Kitchen buPROPion (WELLBUTRIN SR) 150 MG 12 hr tablet TAKE 1 TABLET BY MOUTH ONCE DAILY FOR  THE  FIRST  THREE  DAYS  THEN  TAKE  1  TABLET  TWICE  DAILY (Patient taking differently: 150 mg. TAKE 1 TABLET BY MOUTH ONCE DAILY FOR  THE  FIRST  THREE  DAYS  THEN  TAKE  1  TABLET  TWICE  DAILY)  . BYDUREON 2 MG PEN INJECT 2MG INTO THE SKIN ONCE A WEEK.  . cetirizine (ZYRTEC) 10 MG tablet Take 1 tablet (10 mg total) by mouth daily.  . cyclobenzaprine (FLEXERIL) 10 MG tablet Take 1 tablet (10 mg total) by mouth 3 (three) times daily as needed. for muscle spams  . dicyclomine (BENTYL) 20 MG tablet Take 1 tablet (20 mg total) by mouth 4 (four) times daily as needed for spasms.  . ferrous sulfate 325 (65 FE) MG EC tablet Take 1 tablet (325 mg total) by mouth 3 (three) times daily with meals.  . fluticasone (FLONASE) 50 MCG/ACT nasal spray Place 2 sprays into both nostrils daily.  Marland Kitchen gabapentin (NEURONTIN) 300 MG capsule Take 300 mg by mouth 3 (three) times daily.  . hydrochlorothiazide (HYDRODIURIL) 12.5 MG tablet Take 1 tablet (12.5 mg total) by mouth daily.  . metFORMIN (GLUCOPHAGE-XR) 500 MG 24 hr tablet TAKE 2  TABLETS BY MOUTH TWICE DAILY  . metoprolol succinate (TOPROL-XL) 25 MG 24 hr tablet TAKE 1 TABLET BY MOUTH ONCE DAILY WITH  OR  IMMEDIATELY  FOLLOWING  A  MEAL  . norethindrone (ORTHO MICRONOR) 0.35 MG tablet Take 1 tablet (0.35 mg total) by mouth daily.  Marland Kitchen omeprazole (PRILOSEC) 40 MG capsule Take 1 capsule (40 mg total) by mouth daily. 1 tablet a day  . oxyCODONE-acetaminophen (PERCOCET/ROXICET) 5-325 MG tablet Take 1 tablet by mouth every 4 (four) hours as needed. for pain  . podofilox (CONDYLOX) 0.5 % gel Apply topically 2 (two) times daily. (Patient taking differently: Apply topically as needed. )  . sertraline (ZOLOFT) 50 MG tablet Take 3 tablets (150 mg total) by mouth daily.  Marland Kitchen spironolactone (ALDACTONE) 100 MG tablet Take 1 tablet (100 mg total) by mouth daily.    BP (!) 164/96   Pulse 94   Ht 5' 2"  (1.575 m)   BMI 60.72 kg/m   Physical Exam Vitals signs reviewed.  Constitutional:      Appearance: Normal appearance. She is well-developed.  Musculoskeletal:     Right knee: She exhibits no effusion.  Neurological:     Mental Status: She is alert and oriented to person, place, and time.     Gait: Gait abnormal.  Psychiatric:        Attention and Perception: Attention normal.        Mood and Affect: Mood and affect normal.        Speech: Speech normal.        Behavior: Behavior normal.        Thought Content: Thought content normal.        Judgment: Judgment normal.     Right Knee Exam   Tenderness  The patient is experiencing no tenderness.   Range of Motion  Extension: normal  Right knee flexion: No flexion tested.   Tests  Varus: negative Valgus: negative  Other  Erythema: absent Scars: present Sensation: normal Pulse: present Swelling: none Effusion: no effusion present  Comments:  Paper line looks good all staples were extracted   Left Knee Exam   Muscle Strength  The patient has normal left knee strength.  Tenderness  The patient is  experiencing no tenderness.   Range of Motion  Extension: normal  Flexion: normal   Tests  McMurray:  Medial - negative Lateral - negative Varus: negative Valgus: negative Drawer:  Anterior - negative     Posterior - negative  Other  Erythema: absent  Scars: absent Sensation: normal Pulse: present Swelling: none        MEDICAL DECISION SECTION  Xrays were done at NO  My independent reading of xrays:  NA  Encounter Diagnosis  Name Primary?  . Rupture of right patellar tendon, initial encounter Yes    PLAN: (Rx., injectx, surgery, frx, mri/ct) Knee immobilization with a T scope brace weight-bear as tolerated follow-up in 3 weeks.  I will get the operative report and operative note discharge summary and treat reporting leg.  No orders of the defined types were placed in this encounter.   Arther Abbott, MD  02/22/2019 2:43 PM   02/23/2019 Notes from [facility] are obtained and to pictures are placed in the chart of the operative reports  The patient had a quadriceps tendon laceration partial which was repaired and then the patellar tendon was avulsed from the patella and also repaired please see media for pictures of the operative reports

## 2019-02-23 ENCOUNTER — Ambulatory Visit (HOSPITAL_COMMUNITY): Payer: Medicaid Other

## 2019-02-23 ENCOUNTER — Telehealth: Payer: Self-pay | Admitting: Orthopedic Surgery

## 2019-02-23 NOTE — Telephone Encounter (Signed)
Records from [facility], [place], [phone] / [phone], were received, and on fax machine when we arrived this morning. Records were given to Dr Aline Brochure as requested, and will then be sent for scanning.

## 2019-02-23 NOTE — Telephone Encounter (Signed)
-----   Message from Candice Camp, RT sent at 02/22/2019  3:33 PM EST ----- Regarding: RE: RECORDS Arbie Cookey called primary care, they can not confirm or deny records, and they can not help with this. Arbie Cookey had patient sign a release and they are working on it  ----- Message ----- From: Carole Civil, MD Sent: 02/22/2019   2:44 PM EST To: Santo Held, Uvaldo Bristle, # Subject: RECORDS                                        CALL PRIMARY CARE  THEY HAVE RECORDS  SEND HERE  LET ME KNOW WHEN THEY ARE HERE

## 2019-03-01 ENCOUNTER — Other Ambulatory Visit: Payer: Self-pay | Admitting: *Deleted

## 2019-03-01 NOTE — Telephone Encounter (Signed)
Patient aware.

## 2019-03-01 NOTE — Telephone Encounter (Signed)
Will discuss at next visit.

## 2019-03-07 ENCOUNTER — Encounter: Payer: Self-pay | Admitting: Family Medicine

## 2019-03-07 ENCOUNTER — Ambulatory Visit (INDEPENDENT_AMBULATORY_CARE_PROVIDER_SITE_OTHER): Payer: Medicaid Other | Admitting: Family Medicine

## 2019-03-07 VITALS — BP 154/90 | HR 96 | Temp 99.1°F | Ht 62.0 in | Wt 318.0 lb

## 2019-03-07 DIAGNOSIS — D72829 Elevated white blood cell count, unspecified: Secondary | ICD-10-CM

## 2019-03-07 DIAGNOSIS — E1169 Type 2 diabetes mellitus with other specified complication: Secondary | ICD-10-CM

## 2019-03-07 DIAGNOSIS — I10 Essential (primary) hypertension: Secondary | ICD-10-CM | POA: Diagnosis not present

## 2019-03-07 DIAGNOSIS — E11319 Type 2 diabetes mellitus with unspecified diabetic retinopathy without macular edema: Secondary | ICD-10-CM | POA: Diagnosis not present

## 2019-03-07 DIAGNOSIS — E781 Pure hyperglyceridemia: Secondary | ICD-10-CM

## 2019-03-07 LAB — CBC WITH DIFFERENTIAL/PLATELET
Basophils Absolute: 0.1 10*3/uL (ref 0.0–0.2)
Basos: 0 %
EOS (ABSOLUTE): 0.2 10*3/uL (ref 0.0–0.4)
Eos: 2 %
Hematocrit: 37.2 % (ref 34.0–46.6)
Hemoglobin: 11.4 g/dL (ref 11.1–15.9)
Immature Grans (Abs): 0.1 10*3/uL (ref 0.0–0.1)
Immature Granulocytes: 1 %
Lymphocytes Absolute: 3.1 10*3/uL (ref 0.7–3.1)
Lymphs: 26 %
MCH: 26.5 pg — ABNORMAL LOW (ref 26.6–33.0)
MCHC: 30.6 g/dL — ABNORMAL LOW (ref 31.5–35.7)
MCV: 86 fL (ref 79–97)
MONOS ABS: 0.5 10*3/uL (ref 0.1–0.9)
Monocytes: 4 %
Neutrophils Absolute: 8 10*3/uL — ABNORMAL HIGH (ref 1.4–7.0)
Neutrophils: 67 %
Platelets: 274 10*3/uL (ref 150–450)
RBC: 4.31 x10E6/uL (ref 3.77–5.28)
RDW: 13.7 % (ref 11.7–15.4)
WBC: 11.9 10*3/uL — AB (ref 3.4–10.8)

## 2019-03-07 LAB — CMP14+EGFR
ALT: 32 IU/L (ref 0–32)
AST: 20 IU/L (ref 0–40)
Albumin/Globulin Ratio: 1.5 (ref 1.2–2.2)
Albumin: 4.3 g/dL (ref 3.8–4.8)
Alkaline Phosphatase: 107 IU/L (ref 39–117)
BUN/Creatinine Ratio: 20 (ref 9–23)
BUN: 15 mg/dL (ref 6–20)
Bilirubin Total: 0.4 mg/dL (ref 0.0–1.2)
CO2: 24 mmol/L (ref 20–29)
Calcium: 9.7 mg/dL (ref 8.7–10.2)
Chloride: 97 mmol/L (ref 96–106)
Creatinine, Ser: 0.76 mg/dL (ref 0.57–1.00)
GFR calc Af Amer: 119 mL/min/{1.73_m2} (ref >59)
GFR calc non Af Amer: 103 mL/min/{1.73_m2} (ref >59)
Globulin, Total: 2.9 g/dL (ref 1.5–4.5)
Glucose: 117 mg/dL — ABNORMAL HIGH (ref 65–99)
Potassium: 4.4 mmol/L (ref 3.5–5.2)
Sodium: 139 mmol/L (ref 134–144)
Total Protein: 7.2 g/dL (ref 6.0–8.5)

## 2019-03-07 LAB — LIPID PANEL
CHOL/HDL RATIO: 5.1 ratio — AB (ref 0.0–4.4)
CHOLESTEROL TOTAL: 174 mg/dL (ref 100–199)
HDL: 34 mg/dL — ABNORMAL LOW (ref >39)
LDL Calculated: 83 mg/dL (ref 0–99)
Triglycerides: 284 mg/dL — ABNORMAL HIGH (ref 0–149)
VLDL Cholesterol Cal: 57 mg/dL — ABNORMAL HIGH (ref 5–40)

## 2019-03-07 LAB — BAYER DCA HB A1C WAIVED: HB A1C: 5 % (ref <7.0)

## 2019-03-07 NOTE — Progress Notes (Signed)
Subjective:  Patient ID: Heidi Landry, female    DOB: [birth year], 33 y.o.   MRN: [id number]  Chief Complaint:  Medical Management of Chronic Issues   HPI: Heidi Landry is a 33 y.o. female presenting on 03/07/2019 for Medical Management of Chronic Issues   1. Essential hypertension  Complaint with meds - Yes Checking BP at home ranging 130/80 Exercising Regularly - No Watching Salt intake - Yes Pertinent ROS:  Headache - No Chest pain - No Dyspnea - No Palpitations - No LE edema - Yes, due to recent injury and surgery They report good compliance with medications and can restate their regimen by memory. No medication side effects.  BP Readings from Last 3 Encounters:  03/07/19 (!) 154/90  02/22/19 (!) 164/96  02/16/19 125/74     2. 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 No Exercising regularly? - No Watching carbohydrate intake? - Yes Neuropathy ? - No Hypoglycemic events - No  Pertinent ROS:  Polyuria - No Polydipsia - No Vision problems - No      Relevant past medical, surgical, family, and social history reviewed and updated as indicated.  Allergies and medications reviewed and updated.   Past Medical History:  Diagnosis Date  . Anxiety   . Atrial fibrillation ([place])   . Carpal tunnel syndrome, bilateral   . Chest pain   . Chronic back pain   . Degenerative disc disease   . Edema   . Gestational diabetes mellitus, antepartum   . Hypertension   . IBS (irritable bowel syndrome)   . Panic attacks   . Post partum depression   . Sleep apnea   . Spondylosis   . Tendonitis     Past Surgical History:  Procedure Laterality Date  . CESAREAN SECTION N/A 12/08/2015   Procedure: CESAREAN SECTION;  Surgeon: Jonnie Kind, MD;  Location: [facility];  Service:  Obstetrics;  Laterality: N/A;  . CHOLECYSTECTOMY      Social History   Socioeconomic History  . Marital status: Married    Spouse name: Not on file  . Number of children: Not on file  . Years of education: 47  . Highest education level: Not on file  Occupational History    Employer: [employer]  Social Needs  . Financial resource strain: Not on file  . Food insecurity:    Worry: Not on file    Inability: Not on file  . Transportation needs:    Medical: Not on file    Non-medical: Not on file  Tobacco Use  . Smoking status: Current Every Day Smoker    Packs/day: 0.50    Years: 9.00    Pack years: 4.50    Types: Cigarettes    Start date: 05/25/2006  . Smokeless tobacco: Never Used  Substance and Sexual Activity  . Alcohol use: No    Alcohol/week: 0.0 standard drinks  . Drug use: No  . Sexual activity: Yes    Birth control/protection: Pill  Lifestyle  . Physical activity:    Days per week: Not on file    Minutes per session: Not on file  . Stress: Not on file  Relationships  . Social connections:    Talks on phone: Not on file    Gets together: Not on file    Attends religious service:  Not on file    Active member of club or organization: Not on file    Attends meetings of clubs or organizations: Not on file    Relationship status: Not on file  . Intimate partner violence:    Fear of current or ex partner: Not on file    Emotionally abused: Not on file    Physically abused: Not on file    Forced sexual activity: Not on file  Other Topics Concern  . Not on file  Social History Narrative  . Not on file    Outpatient Encounter Medications as of 03/07/2019  Medication Sig  . ACCU-CHEK AVIVA PLUS test strip USE TO CHECK BLOOD GLUCOSE ONCE DAILY OR EVERY OTHER DAY  . ACCU-CHEK FASTCLIX LANCETS MISC 1 each daily by Does not apply route. Use to check BG four times daily  . albuterol (PROVENTIL HFA;VENTOLIN HFA) 108 (90 Base) MCG/ACT inhaler Inhale 2 puffs  into the lungs every 6 (six) hours as needed for wheezing or shortness of breath.  . bevacizumab (AVASTIN) 400 MG/16ML SOLN Inject into the vein once. Every 8 weeks injected into right eye.  Marland Kitchen buPROPion (WELLBUTRIN SR) 150 MG 12 hr tablet TAKE 1 TABLET BY MOUTH ONCE DAILY FOR  THE  FIRST  THREE  DAYS  THEN  TAKE  1  TABLET  TWICE  DAILY (Patient taking differently: 150 mg. TAKE 1 TABLET BY MOUTH ONCE DAILY FOR  THE  FIRST  THREE  DAYS  THEN  TAKE  1  TABLET  TWICE  DAILY)  . BYDUREON 2 MG PEN INJECT 2MG INTO THE SKIN ONCE A WEEK.  . cetirizine (ZYRTEC) 10 MG tablet Take 1 tablet (10 mg total) by mouth daily.  . cyclobenzaprine (FLEXERIL) 10 MG tablet Take 1 tablet (10 mg total) by mouth 3 (three) times daily as needed. for muscle spams  . dicyclomine (BENTYL) 20 MG tablet Take 1 tablet (20 mg total) by mouth 4 (four) times daily as needed for spasms.  . ferrous sulfate 325 (65 FE) MG EC tablet Take 1 tablet (325 mg total) by mouth 3 (three) times daily with meals.  . fluticasone (FLONASE) 50 MCG/ACT nasal spray Place 2 sprays into both nostrils daily.  Marland Kitchen gabapentin (NEURONTIN) 300 MG capsule Take 300 mg by mouth 3 (three) times daily.  . hydrochlorothiazide (HYDRODIURIL) 12.5 MG tablet Take 1 tablet (12.5 mg total) by mouth daily.  . metFORMIN (GLUCOPHAGE-XR) 500 MG 24 hr tablet TAKE 2 TABLETS BY MOUTH TWICE DAILY  . metoprolol succinate (TOPROL-XL) 25 MG 24 hr tablet TAKE 1 TABLET BY MOUTH ONCE DAILY WITH  OR  IMMEDIATELY  FOLLOWING  A  MEAL  . norethindrone (ORTHO MICRONOR) 0.35 MG tablet Take 1 tablet (0.35 mg total) by mouth daily.  Marland Kitchen omeprazole (PRILOSEC) 40 MG capsule Take 1 capsule (40 mg total) by mouth daily. 1 tablet a day  . oxyCODONE-acetaminophen (PERCOCET/ROXICET) 5-325 MG tablet Take 1 tablet by mouth every 4 (four) hours as needed. for pain  . podofilox (CONDYLOX) 0.5 % gel Apply topically 2 (two) times daily. (Patient taking differently: Apply topically as needed. )  . sertraline  (ZOLOFT) 50 MG tablet Take 3 tablets (150 mg total) by mouth daily.  Marland Kitchen spironolactone (ALDACTONE) 100 MG tablet Take 1 tablet (100 mg total) by mouth daily.   No facility-administered encounter medications on file as of 03/07/2019.     No Known Allergies  Review of Systems  Constitutional: Negative for chills, fatigue and fever.  Eyes: Negative for photophobia and visual disturbance.  Respiratory: Negative for cough and shortness of breath.   Cardiovascular: Positive for leg swelling (right lower extremity due to recent injury and surgery). Negative for chest pain and palpitations.  Endocrine: Negative for polydipsia, polyphagia and polyuria.  Skin: Positive for wound (surgical wound to right lower extremity, healing well, no s/s of infection.).  Neurological: Negative for dizziness, tremors, seizures, syncope, facial asymmetry, speech difficulty, weakness, light-headedness, numbness and headaches.  Psychiatric/Behavioral: Negative for confusion.  All other systems reviewed and are negative.       Objective:  BP (!) 154/90   Pulse 96   Temp 99.1 F (37.3 C) (Oral)   Ht 5' 2"  (1.575 m)   Wt (!) 318 lb (144.2 kg)   BMI 58.16 kg/m    Wt Readings from Last 3 Encounters:  03/07/19 (!) 318 lb (144.2 kg)  01/20/19 (!) 332 lb (150.6 kg)  12/09/18 (!) 328 lb (148.8 kg)    Physical Exam Vitals signs and nursing note reviewed.  Constitutional:      General: She is not in acute distress.    Appearance: Normal appearance. She is obese. She is not ill-appearing or toxic-appearing.  HENT:     Head: Normocephalic and atraumatic.     Right Ear: Tympanic membrane, ear canal and external ear normal.     Left Ear: Tympanic membrane, ear canal and external ear normal.     Nose: Nose normal.     Mouth/Throat:     Mouth: Mucous membranes are moist.     Pharynx: Oropharynx is clear.  Eyes:     Extraocular Movements: Extraocular movements intact.     Conjunctiva/sclera: Conjunctivae normal.      Pupils: Pupils are equal, round, and reactive to light.  Neck:     Musculoskeletal: Normal range of motion and neck supple. No neck rigidity.     Thyroid: No thyroid mass, thyromegaly or thyroid tenderness.     Vascular: No carotid bruit or JVD.     Trachea: Trachea and phonation normal.  Cardiovascular:     Rate and Rhythm: Normal rate and regular rhythm.     Pulses: Normal pulses.          Dorsalis pedis pulses are 2+ on the right side and 2+ on the left side.       Posterior tibial pulses are 2+ on the right side and 2+ on the left side.     Heart sounds: Normal heart sounds. No murmur. No friction rub. No gallop.   Pulmonary:     Effort: Pulmonary effort is normal. No respiratory distress.     Breath sounds: Normal breath sounds. No wheezing.  Abdominal:     General: Bowel sounds are normal. There is no distension.     Palpations: Abdomen is soft.     Tenderness: There is no abdominal tenderness. There is no right CVA tenderness or left CVA tenderness.  Feet:     Right foot:     Protective Sensation: 10 sites tested. 10 sites sensed.     Skin integrity: Skin integrity normal.     Left foot:     Protective Sensation: 10 sites tested. 10 sites sensed.     Skin integrity: Skin integrity normal.  Lymphadenopathy:     Cervical: No cervical adenopathy.  Skin:    General: Skin is warm and dry.     Capillary Refill: Capillary refill takes less than 2 seconds.  Neurological:  General: No focal deficit present.     Mental Status: She is alert and oriented to person, place, and time.  Psychiatric:        Mood and Affect: Mood normal.        Behavior: Behavior normal. Behavior is cooperative.        Thought Content: Thought content normal.        Judgment: Judgment normal.     Results for orders placed or performed in visit on 02/16/19  CBC with Differential/Platelet  Result Value Ref Range   WBC 13.3 (H) 3.4 - 10.8 x10E3/uL   RBC 3.32 (L) 3.77 - 5.28 x10E6/uL    Hemoglobin 9.2 (L) 11.1 - 15.9 g/dL   Hematocrit 28.7 (L) 34.0 - 46.6 %   MCV 86 79 - 97 fL   MCH 27.7 26.6 - 33.0 pg   MCHC 32.1 31.5 - 35.7 g/dL   RDW 14.1 11.7 - 15.4 %   Platelets 345 150 - 450 x10E3/uL   Neutrophils 64 Not Estab. %   Lymphs 26 Not Estab. %   Monocytes 5 Not Estab. %   Eos 2 Not Estab. %   Basos 1 Not Estab. %   Neutrophils Absolute 8.5 (H) 1.4 - 7.0 x10E3/uL   Lymphocytes Absolute 3.5 (H) 0.7 - 3.1 x10E3/uL   Monocytes Absolute 0.6 0.1 - 0.9 x10E3/uL   EOS (ABSOLUTE) 0.3 0.0 - 0.4 x10E3/uL   Basophils Absolute 0.1 0.0 - 0.2 x10E3/uL   Immature Granulocytes 2 Not Estab. %   Immature Grans (Abs) 0.3 (H) 0.0 - 0.1 x10E3/uL   NRBC 1 (H) 0 - 0 %     A1C 5.0 in office today.   Pertinent labs & imaging results that were available during my care of the patient were reviewed by me and considered in my medical decision making.  Assessment & Plan:  Mayrani was seen today for medical management of chronic issues.  Diagnoses and all orders for this visit:  Essential hypertension Elevated reading today. States she has been normal at home. States she took her medication right before appointment time. DASH diet discussed. Report any persistent highs or new or worsening symptoms. Will recheck in 3 months and adjust therapy if warranted.  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 Pt denies lows at home. Labs pending. Report any new or worsening symptoms. Continue medications as prescribed. Will schedule eye exam.  -     Microalbumin / creatinine urine ratio -     CMP14+EGFR -     CBC with Differential/Platelet -     Lipid panel -     Bayer DCA Hb A1c Waived     Continue all other maintenance medications.  Follow up plan: Return in about 3  months (around 06/07/2019), or if symptoms worsen or fail to improve, for DM, HTN.  Educational handout given for DASH diet. BP log provided also  The above assessment and management plan was discussed with the patient. The patient verbalized understanding of and has agreed to the management plan. Patient is aware to call the clinic if symptoms persist or worsen. Patient is aware when to return to the clinic for a follow-up visit. Patient educated on when it is appropriate to go to the  emergency department.   Monia Pouch, FNP-C [facility] [phone]

## 2019-03-07 NOTE — Patient Instructions (Signed)
DASH Eating Plan  DASH stands for "Dietary Approaches to Stop Hypertension." The DASH eating plan is a healthy eating plan that has been shown to reduce high blood pressure (hypertension). It may also reduce your risk for type 2 diabetes, heart disease, and stroke. The DASH eating plan may also help with weight loss.  What are tips for following this plan?    General guidelines   Avoid eating more than 2,300 mg (milligrams) of salt (sodium) a day. If you have hypertension, you may need to reduce your sodium intake to 1,500 mg a day.   Limit alcohol intake to no more than 1 drink a day for nonpregnant women and 2 drinks a day for men. One drink equals 12 oz of beer, 5 oz of wine, or 1 oz of hard liquor.   Work with your health care provider to maintain a healthy body weight or to lose weight. Ask what an ideal weight is for you.   Get at least 30 minutes of exercise that causes your heart to beat faster (aerobic exercise) most days of the week. Activities may include walking, swimming, or biking.   Work with your health care provider or diet and nutrition specialist (dietitian) to adjust your eating plan to your individual calorie needs.  Reading food labels     Check food labels for the amount of sodium per serving. Choose foods with less than 5 percent of the Daily Value of sodium. Generally, foods with less than 300 mg of sodium per serving fit into this eating plan.   To find whole grains, look for the word "whole" as the first word in the ingredient list.  Shopping   Buy products labeled as "low-sodium" or "no salt added."   Buy fresh foods. Avoid canned foods and premade or frozen meals.  Cooking   Avoid adding salt when cooking. Use salt-free seasonings or herbs instead of table salt or sea salt. Check with your health care provider or pharmacist before using salt substitutes.   Do not fry foods. Cook foods using healthy methods such as baking, boiling, grilling, and broiling instead.   Cook with  heart-healthy oils, such as olive, canola, soybean, or sunflower oil.  Meal planning   Eat a balanced diet that includes:  ? 5 or more servings of fruits and vegetables each day. At each meal, try to fill half of your plate with fruits and vegetables.  ? Up to 6-8 servings of whole grains each day.  ? Less than 6 oz of lean meat, poultry, or fish each day. A 3-oz serving of meat is about the same size as a deck of cards. One egg equals 1 oz.  ? 2 servings of low-fat dairy each day.  ? A serving of nuts, seeds, or beans 5 times each week.  ? Heart-healthy fats. Healthy fats called Omega-3 fatty acids are found in foods such as flaxseeds and coldwater fish, like sardines, salmon, and mackerel.   Limit how much you eat of the following:  ? Canned or prepackaged foods.  ? Food that is high in trans fat, such as fried foods.  ? Food that is high in saturated fat, such as fatty meat.  ? Sweets, desserts, sugary drinks, and other foods with added sugar.  ? Full-fat dairy products.   Do not salt foods before eating.   Try to eat at least 2 vegetarian meals each week.   Eat more home-cooked food and less restaurant, buffet, and fast food.     When eating at a restaurant, ask that your food be prepared with less salt or no salt, if possible.  What foods are recommended?  The items listed may not be a complete list. Talk with your dietitian about what dietary choices are best for you.  Grains  Whole-grain or whole-wheat bread. Whole-grain or whole-wheat pasta. Brown rice. Oatmeal. Quinoa. Bulgur. Whole-grain and low-sodium cereals. Pita bread. Low-fat, low-sodium crackers. Whole-wheat flour tortillas.  Vegetables  Fresh or frozen vegetables (raw, steamed, roasted, or grilled). Low-sodium or reduced-sodium tomato and vegetable juice. Low-sodium or reduced-sodium tomato sauce and tomato paste. Low-sodium or reduced-sodium canned vegetables.  Fruits  All fresh, dried, or frozen fruit. Canned fruit in natural juice (without  added sugar).  Meat and other protein foods  Skinless chicken or turkey. Ground chicken or turkey. Pork with fat trimmed off. Fish and seafood. Egg whites. Dried beans, peas, or lentils. Unsalted nuts, nut butters, and seeds. Unsalted canned beans. Lean cuts of beef with fat trimmed off. Low-sodium, lean deli meat.  Dairy  Low-fat (1%) or fat-free (skim) milk. Fat-free, low-fat, or reduced-fat cheeses. Nonfat, low-sodium ricotta or cottage cheese. Low-fat or nonfat yogurt. Low-fat, low-sodium cheese.  Fats and oils  Soft margarine without trans fats. Vegetable oil. Low-fat, reduced-fat, or light mayonnaise and salad dressings (reduced-sodium). Canola, safflower, olive, soybean, and sunflower oils. Avocado.  Seasoning and other foods  Herbs. Spices. Seasoning mixes without salt. Unsalted popcorn and pretzels. Fat-free sweets.  What foods are not recommended?  The items listed may not be a complete list. Talk with your dietitian about what dietary choices are best for you.  Grains  Baked goods made with fat, such as croissants, muffins, or some breads. Dry pasta or rice meal packs.  Vegetables  Creamed or fried vegetables. Vegetables in a cheese sauce. Regular canned vegetables (not low-sodium or reduced-sodium). Regular canned tomato sauce and paste (not low-sodium or reduced-sodium). Regular tomato and vegetable juice (not low-sodium or reduced-sodium). Pickles. Olives.  Fruits  Canned fruit in a light or heavy syrup. Fried fruit. Fruit in cream or butter sauce.  Meat and other protein foods  Fatty cuts of meat. Ribs. Fried meat. Bacon. Sausage. Bologna and other processed lunch meats. Salami. Fatback. Hotdogs. Bratwurst. Salted nuts and seeds. Canned beans with added salt. Canned or smoked fish. Whole eggs or egg yolks. Chicken or turkey with skin.  Dairy  Whole or 2% milk, cream, and half-and-half. Whole or full-fat cream cheese. Whole-fat or sweetened yogurt. Full-fat cheese. Nondairy creamers. Whipped toppings.  Processed cheese and cheese spreads.  Fats and oils  Butter. Stick margarine. Lard. Shortening. Ghee. Bacon fat. Tropical oils, such as coconut, palm kernel, or palm oil.  Seasoning and other foods  Salted popcorn and pretzels. Onion salt, garlic salt, seasoned salt, table salt, and sea salt. Worcestershire sauce. Tartar sauce. Barbecue sauce. Teriyaki sauce. Soy sauce, including reduced-sodium. Steak sauce. Canned and packaged gravies. Fish sauce. Oyster sauce. Cocktail sauce. Horseradish that you find on the shelf. Ketchup. Mustard. Meat flavorings and tenderizers. Bouillon cubes. Hot sauce and Tabasco sauce. Premade or packaged marinades. Premade or packaged taco seasonings. Relishes. Regular salad dressings.  Where to find more information:   National Heart, Lung, and Blood Institute: www.nhlbi.nih.gov   American Heart Association: www.heart.org  Summary   The DASH eating plan is a healthy eating plan that has been shown to reduce high blood pressure (hypertension). It may also reduce your risk for type 2 diabetes, heart disease, and stroke.   With the   DASH eating plan, you should limit salt (sodium) intake to 2,300 mg a day. If you have hypertension, you may need to reduce your sodium intake to 1,500 mg a day.   When on the DASH eating plan, aim to eat more fresh fruits and vegetables, whole grains, lean proteins, low-fat dairy, and heart-healthy fats.   Work with your health care provider or diet and nutrition specialist (dietitian) to adjust your eating plan to your individual calorie needs.  This information is not intended to replace advice given to you by your health care provider. Make sure you discuss any questions you have with your health care provider.  Document Released: 12/04/2011 Document Revised: 12/08/2016 Document Reviewed: 12/08/2016  Elsevier Interactive Patient Education  2019 Elsevier Inc.

## 2019-03-08 DIAGNOSIS — D72829 Elevated white blood cell count, unspecified: Secondary | ICD-10-CM | POA: Insufficient documentation

## 2019-03-08 DIAGNOSIS — E1169 Type 2 diabetes mellitus with other specified complication: Secondary | ICD-10-CM | POA: Insufficient documentation

## 2019-03-08 DIAGNOSIS — E11319 Type 2 diabetes mellitus with unspecified diabetic retinopathy without macular edema: Secondary | ICD-10-CM | POA: Insufficient documentation

## 2019-03-08 DIAGNOSIS — E781 Pure hyperglyceridemia: Secondary | ICD-10-CM

## 2019-03-08 LAB — MICROALBUMIN / CREATININE URINE RATIO
Creatinine, Urine: 103.8 mg/dL
Microalb/Creat Ratio: 23 mg/g creat (ref 0–29)
Microalbumin, Urine: 23.7 ug/mL

## 2019-03-08 MED ORDER — ATORVASTATIN CALCIUM 20 MG PO TABS: 20.0000 mg | ORAL_TABLET | Freq: Every day | ORAL | 3 refills | Status: DC

## 2019-03-08 NOTE — Addendum Note (Signed)
Addended by: Baruch Gouty on: 03/08/2019 11:41 AM   Modules accepted: Orders

## 2019-03-14 ENCOUNTER — Other Ambulatory Visit: Payer: Self-pay | Admitting: Pediatrics

## 2019-03-14 ENCOUNTER — Ambulatory Visit (HOSPITAL_COMMUNITY): Payer: Self-pay | Admitting: Hematology

## 2019-03-14 DIAGNOSIS — J3089 Other allergic rhinitis: Secondary | ICD-10-CM

## 2019-03-15 ENCOUNTER — Ambulatory Visit: Payer: Medicaid Other | Admitting: Cardiology

## 2019-03-16 ENCOUNTER — Ambulatory Visit: Payer: Medicaid Other | Admitting: Orthopedic Surgery

## 2019-03-16 ENCOUNTER — Other Ambulatory Visit: Payer: Self-pay

## 2019-03-16 ENCOUNTER — Encounter: Payer: Self-pay | Admitting: Orthopedic Surgery

## 2019-03-16 VITALS — BP 132/72 | HR 110 | Ht 62.0 in | Wt 318.0 lb

## 2019-03-16 DIAGNOSIS — L03115 Cellulitis of right lower limb: Secondary | ICD-10-CM | POA: Diagnosis not present

## 2019-03-16 DIAGNOSIS — S86811D Strain of other muscle(s) and tendon(s) at lower leg level, right leg, subsequent encounter: Secondary | ICD-10-CM

## 2019-03-16 MED ORDER — CEPHALEXIN 500 MG PO CAPS: 500.0000 mg | ORAL_CAPSULE | Freq: Two times a day (BID) | ORAL | 1 refills | Status: AC

## 2019-03-16 NOTE — Progress Notes (Addendum)
Chief Complaint  Patient presents with  . Knee Injury    surgery in [place] 02/04/19 right knee / improving d/c brace 1 week ago     33 year old female had a traumatic laceration to her right lower extremity which included a patella tendon injury.  Initial treatment was in [place] she was initially in the immobilizer comes in today walking independently excellent extension with some discomfort    System review she has some tenderness at 1 of the areas of the incision which has a scab over it  Physical Exam Vitals signs and nursing note reviewed.  Constitutional:      Appearance: Normal appearance.  Neurological:     Mental Status: She is alert and oriented to person, place, and time.  Psychiatric:        Mood and Affect: Mood normal.    Ambulation independent  Curvilinear incision extends down to the patellar tendon area on the right mid lateral region of the incision there is a 5 to 7 mm wide scab with some tenderness around it and some erythema  Flexion extension arc looks good extension power looks good  Plan Recommend routine wound care Oral antibiotic Return 2 weeks

## 2019-03-21 ENCOUNTER — Telehealth: Payer: Self-pay | Admitting: Obstetrics & Gynecology

## 2019-03-21 ENCOUNTER — Telehealth: Payer: Self-pay | Admitting: *Deleted

## 2019-03-21 MED ORDER — NORETHINDRONE 0.35 MG PO TABS: 1.0000 | ORAL_TABLET | Freq: Every day | ORAL | 2 refills | Status: DC

## 2019-03-21 NOTE — Telephone Encounter (Signed)
Refilled this time due to extraordinary time Will need appt before the next refill, no exception

## 2019-03-21 NOTE — Telephone Encounter (Signed)
LMOVM that I would send her request for refill on birth control to Dr Elonda Husky. Advised that he may not send in a refill as she did not come in for her appt after the last request she had.

## 2019-03-21 NOTE — Telephone Encounter (Signed)
Patient called, stated she needs a refill on her birth control.  [facility] [place]  [phone]

## 2019-03-21 NOTE — Telephone Encounter (Signed)
LMOVM that refill had been sent to pharmacy. Advised that no more refills would be sent in after this until she is seen for an exam.

## 2019-03-30 ENCOUNTER — Other Ambulatory Visit: Payer: Self-pay

## 2019-03-30 ENCOUNTER — Ambulatory Visit: Payer: Medicaid Other | Admitting: Orthopedic Surgery

## 2019-03-30 ENCOUNTER — Encounter: Payer: Self-pay | Admitting: Orthopedic Surgery

## 2019-03-30 VITALS — BP 134/71 | HR 97 | Temp 97.3°F | Ht 62.0 in | Wt 330.0 lb

## 2019-03-30 DIAGNOSIS — L03115 Cellulitis of right lower limb: Secondary | ICD-10-CM

## 2019-03-30 DIAGNOSIS — S86811D Strain of other muscle(s) and tendon(s) at lower leg level, right leg, subsequent encounter: Secondary | ICD-10-CM | POA: Diagnosis not present

## 2019-03-30 NOTE — Progress Notes (Signed)
Chief Complaint  Patient presents with  . Leg Injury    DOI 02/04/19   BP 134/71   Pulse 97   Temp (!) 97.3 F (36.3 C)   Ht 5' 2"  (1.575 m)   Wt (!) 330 lb (149.7 kg)   BMI 60.11 kg/m    33 year old female had a traumatic laceration to her right lower extremity which included a patella tendon injury.   Last visit she was doing well she had a little bit of a wound issue so we put her on an antibiotic/KEFLEX and told her to come back for wound check  She has several scabs over the different areas of the wound but overall looks very good  I told her to use Neosporin she can finish her antibiotics and see me in 3 weeks for wound check  I showed her some knee exercises which are basically heel slides 25 3 times a day to improve her knee motion  She is walking independently she can lift her leg fine.      Encounter Diagnoses  Name Primary?  . Rupture of right patellar tendon, subsequent encounter   . Cellulitis of leg, right Yes

## 2019-04-02 ENCOUNTER — Other Ambulatory Visit: Payer: Self-pay | Admitting: Pediatrics

## 2019-04-02 DIAGNOSIS — K219 Gastro-esophageal reflux disease without esophagitis: Secondary | ICD-10-CM

## 2019-04-02 DIAGNOSIS — Z720 Tobacco use: Secondary | ICD-10-CM

## 2019-04-06 ENCOUNTER — Other Ambulatory Visit: Payer: Self-pay | Admitting: Pediatrics

## 2019-04-06 DIAGNOSIS — Z720 Tobacco use: Secondary | ICD-10-CM

## 2019-04-06 DIAGNOSIS — K219 Gastro-esophageal reflux disease without esophagitis: Secondary | ICD-10-CM

## 2019-04-11 ENCOUNTER — Other Ambulatory Visit: Payer: Self-pay

## 2019-04-11 ENCOUNTER — Telehealth: Payer: Self-pay

## 2019-04-11 ENCOUNTER — Ambulatory Visit (INDEPENDENT_AMBULATORY_CARE_PROVIDER_SITE_OTHER): Payer: Medicaid Other | Admitting: Nurse Practitioner

## 2019-04-11 ENCOUNTER — Encounter: Payer: Self-pay | Admitting: Nurse Practitioner

## 2019-04-11 ENCOUNTER — Encounter: Payer: Self-pay | Admitting: Internal Medicine

## 2019-04-11 DIAGNOSIS — R7989 Other specified abnormal findings of blood chemistry: Secondary | ICD-10-CM | POA: Insufficient documentation

## 2019-04-11 DIAGNOSIS — K219 Gastro-esophageal reflux disease without esophagitis: Secondary | ICD-10-CM

## 2019-04-11 DIAGNOSIS — R945 Abnormal results of liver function studies: Secondary | ICD-10-CM | POA: Diagnosis not present

## 2019-04-11 DIAGNOSIS — K760 Fatty (change of) liver, not elsewhere classified: Secondary | ICD-10-CM | POA: Diagnosis not present

## 2019-04-11 NOTE — Assessment & Plan Note (Signed)
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  She did have elevation of her LFTs which is improved and has had 1 transaminase normalized.  Bilirubin and alkaline phosphatase have always been normal.  She did have an elevated mitochondrial antibody, normal ANA and anti-smooth muscle antibodies.  She has lost a significant amount of weight in the past 6 months through diet and exercise which is likely accounting for her improvement in LFTs.  I will recheck her labs today including CBC, BMP, HFP.  At this point given her elevated mitochondrial antibody I feel its best to proceed with an MRI/MRCP to evaluate for possible PBC.  Return for follow-up in 6 months.

## 2019-04-11 NOTE — Patient Instructions (Signed)
Your health issues we discussed today were:   Abnormal liver labs and nonalcoholic fatty liver disease: 1. As we discussed your liver labs have improved along with your weight loss 2. Continue diet and exercise to help continued weight loss 3. Given your elevated mitochondrial antibodies we will plan for an MRI/MRCP to evaluate for PBC (primary biliary cholangitis) 4. Have your lab test completed when you can 5. Further recommendations to follow your lab results and MRI  GERD (heartburn): 1. I am glad your heartburn is doing well on the medication 2. Continue taking your medication and let us know if you have any worsening/severe symptoms  Overall I recommend:  1. Continue your other medications 2. Call us if you have any questions or concerns 3. Follow-up in 6 months   Because of recent events of COVID-19 ("Coronavirus"), follow CDC recommendations:  1. Wash your hand frequently 2. Avoid touching your face 3. Stay away from people who are sick 4. If you have symptoms such as fever, cough, shortness of breath then call your healthcare provider for further guidance 5. If you are sick, STAY AT HOME unless otherwise directed by your healthcare provider. 6. Follow directions from state and national officials regarding staying safe    At [facility] we value your feedback. You may receive a survey about your visit today. Please share your experience as we strive to create trusting relationships with our patients to provide genuine, compassionate, quality care.  We appreciate your understanding and patience as we review any laboratory studies, imaging, and other diagnostic tests that are ordered as we care for you. Our office policy is 5 business days for review of these results, and any emergent or urgent results are addressed in a timely manner for your best interest. If you do not hear from our office in 1 week, please contact us.   We also encourage the use of MyChart,  which contains your medical information for your review as well. If you are not enrolled in this feature, an access code is on this after visit summary for your convenience. Thank you for allowing [place] to be involved in your care.  It was great to see you today!  I hope you have a great day!!

## 2019-04-11 NOTE — Progress Notes (Signed)
Referring Provider: Eustaquio Maize, MD Primary Care Physician:  Baruch Gouty, FNP Primary GI:  Dr. Gala Romney  NOTE: Service was provided via telemedicine and was requested by the patient due to COVID-19 pandemic.  Method of visit: Zoom  Patient Location: Home  Provider Location: Office  Reason for Phone Visit: Follow-up on GERD.  The patient was consented to phone follow-up via telephone encounter including billing of the encounter (yes/no): Yes  Persons present on the phone encounter, with roles: Son  Total time (minutes) spent on medical discussion: 24 minutes  Chief Complaint  Patient presents with   Follow-up    Fatty liver disease,acid reflux under control per pt,wants to go over recent bloodwork we did a few months ago    HPI:   Heidi Landry is a 33 y.o. female who presents for virtual visit regarding: Follow-up on GERD.  Patient was last seen in our office 12/09/2018 for GERD, NAFLD, transaminitis.  Noted history of elevated BMI with diabetes, hypertension, fatty liver disease.  Was seen by bariatric surgery previously but noted limited options.  HFP in August 2019 found mild elevation of AST/ALT at 71/77 which is stable, bilirubin, alk phos, albumin all normal.  Right upper quadrant ultrasound and increased hepatic echotexture diffusely consistent with fatty infiltrative change, splenic enlargement/elongation, previous platelets mildly suppressed but still normal at 185.  At her last visit she was doing well overall, had been working on weight loss and had lost a total of 40 pounds over the previous 6 to 7 months.  Is increasing exercise since diagnosis with NAFLD although occasional limitations with back pain.  Rare/intermittent nausea which is brief and self resolves.  No other GI symptoms.  Had never been tested with for hepatitis viruses.  Father with a history of colon cancer at age 35.  Recommended serologic liver work-up, discussed treatment of fatty liver disease  extensively including diet and exercise and recommended she continue the excellent progress she had made, follow-up in 4 months.  Labs completed 12/20/2018 found normal hemoglobin, improved/normal platelets at 249, improvement in LFTs with AST normal at 31 and ALT mildly elevated at 50, normal bilirubin and alk phos.  Ferritin normal at 124.  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  Normal ANA, normal smooth muscle antibodies.  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  There was a patient email that appears to have been sent questioning the mitochondrial antibodies result, however this was not received by myself.  Today she states she's doing well overall. She had been going to the gym until the stay-in place order. GERD is doing well on PPI (omeprazole 40 mg daily). No breakthrough. Denies abdominal pain, N/V, hematochezia, melena, fever, chills, unintentional weight loss. Denies yellowing of skin/eyes, darkened urine, acute episodic confusion, tremors, generalized pruritis. She does have a cough (she has chronically due to smoking) and at baseline. No other URI and flue-like symptoms. Denies chest pain, dyspnea, dizziness, lightheadedness, syncope, near syncope. Denies any other upper or lower GI symptoms.  She recently had a significant fall through a tile floor and states her leg was cut down to the bone. She went to the hospital and required PRBC x 4, states "I almost died". This occurred 2019/03/03. She was on iron due to blood loss but this has been discontinued due to normalization of hgb (11 compared to 5 or 6).  Past Medical History:  Diagnosis Date   Anxiety    Atrial fibrillation ([place])    Carpal  tunnel syndrome, bilateral    Chest pain    Chronic back pain    Degenerative disc disease    Edema    Gestational diabetes mellitus, antepartum    Hypertension    IBS (irritable bowel syndrome)    Panic attacks    Post partum depression    Sleep apnea      Spondylosis    Tendonitis     Past Surgical History:  Procedure Laterality Date   CESAREAN SECTION N/A 12/08/2015   Procedure: CESAREAN SECTION;  Surgeon: Jonnie Kind, MD;  Location: [facility];  Service: Obstetrics;  Laterality: N/A;   CHOLECYSTECTOMY     KNEE SURGERY Right 01/29/2019   at [facility] in [place], [place]; s/p fall and deel leg laceration    Current Outpatient Medications  Medication Sig Dispense Refill   albuterol (PROVENTIL HFA;VENTOLIN HFA) 108 (90 Base) MCG/ACT inhaler Inhale 2 puffs into the lungs every 6 (six) hours as needed for wheezing or shortness of breath. 1 Inhaler 5   atorvastatin (LIPITOR) 20 MG tablet Take 1 tablet (20 mg total) by mouth daily. 90 tablet 3   bevacizumab (AVASTIN) 400 MG/16ML SOLN Inject into the vein once. Every 8 weeks injected into right eye.     buPROPion (WELLBUTRIN SR) 150 MG 12 hr tablet Take 1 tablet (150 mg total) by mouth 2 (two) times daily. 180 tablet 0   BYDUREON 2 MG PEN INJECT 2MG INTO THE SKIN ONCE A WEEK. (Patient taking differently: On Saturday) 12 each 1   cyclobenzaprine (FLEXERIL) 10 MG tablet Take 1 tablet (10 mg total) by mouth 3 (three) times daily as needed. for muscle spams 30 tablet 2   dicyclomine (BENTYL) 20 MG tablet Take 1 tablet (20 mg total) by mouth 4 (four) times daily as needed for spasms. 60 tablet 3   EQ ALLERGY RELIEF, CETIRIZINE, 10 MG tablet Take 1 tablet by mouth once daily 90 tablet 0   fluticasone (FLONASE) 50 MCG/ACT nasal spray Place 2 sprays into both nostrils daily. 16 g 6   hydrochlorothiazide (HYDRODIURIL) 12.5 MG tablet Take 1 tablet (12.5 mg total) by mouth daily. 90 tablet 1   metFORMIN (GLUCOPHAGE-XR) 500 MG 24 hr tablet TAKE 2 TABLETS BY MOUTH TWICE DAILY 360 tablet 0   metoprolol succinate (TOPROL-XL) 25 MG 24 hr tablet TAKE 1 TABLET BY MOUTH ONCE DAILY WITH  OR  IMMEDIATELY  FOLLOWING  A  MEAL 90 tablet 1   norethindrone (ORTHO MICRONOR) 0.35 MG  tablet Take 1 tablet (0.35 mg total) by mouth daily. 1 Package 2   omeprazole (PRILOSEC) 40 MG capsule Take 1 capsule by mouth once daily 90 capsule 1   oxyCODONE-acetaminophen (PERCOCET/ROXICET) 5-325 MG tablet Take 1 tablet by mouth every 4 (four) hours as needed. for pain     podofilox (CONDYLOX) 0.5 % gel Apply topically 2 (two) times daily. (Patient taking differently: Apply topically as needed. ) 3.5 g 11   Prenatal Vit-Fe Fumarate-FA (PRENATAL MULTIVITAMIN) TABS tablet Take 1 tablet by mouth daily at 12 noon.     senna-docusate (SENOKOT-S) 8.6-50 MG tablet Take 1 tablet by mouth 2 (two) times daily at 10 AM and 5 PM. Takes every other day     sertraline (ZOLOFT) 50 MG tablet Take 3 tablets (150 mg total) by mouth daily. 270 tablet 1   spironolactone (ALDACTONE) 100 MG tablet Take 1 tablet (100 mg total) by mouth daily. 90 tablet 1   No current facility-administered medications  for this visit.     Allergies as of 04/11/2019   (No Known Allergies)    Family History  Problem Relation Age of Onset   Fibromyalgia Mother    Hernia Father    Lung cancer Father    Colon cancer Father 20       partial colectomy   Liver disease Father        Fatty liver; ? ETOH-related   Diabetes Maternal Grandmother    Hypertension Maternal Grandmother    Cancer Paternal Uncle    Brain cancer Paternal Uncle    Throat cancer Paternal Uncle    Thyroid disease Paternal Aunt     Social History   Socioeconomic History   Marital status: Married    Spouse name: Not on file   Number of children: Not on file   Years of education: 12   Highest education level: Not on file  Occupational History    Employer: [employer]  Social Needs   Financial resource strain: Not on file   Food insecurity:    Worry: Not on file    Inability: Not on file   Transportation needs:    Medical: Not on file    Non-medical: Not on file  Tobacco Use   Smoking status: Current Every  Day Smoker    Packs/day: 0.50    Years: 9.00    Pack years: 4.50    Types: Cigarettes    Start date: 05/25/2006   Smokeless tobacco: Never Used  Substance and Sexual Activity   Alcohol use: No    Alcohol/week: 0.0 standard drinks   Drug use: No   Sexual activity: Yes    Birth control/protection: Pill  Lifestyle   Physical activity:    Days per week: Not on file    Minutes per session: Not on file   Stress: Not on file  Relationships   Social connections:    Talks on phone: Not on file    Gets together: Not on file    Attends religious service: Not on file    Active member of club or organization: Not on file    Attends meetings of clubs or organizations: Not on file    Relationship status: Not on file  Other Topics Concern   Not on file  Social History Narrative   Not on file    Review of Systems: General: Negative for anorexia, weight loss, fever, chills, fatigue, weakness. ENT: Negative for hoarseness, difficulty swallowing. CV: Negative for chest pain, angina, palpitations, peripheral edema.  Respiratory: Negative for dyspnea at rest, cough, sputum, wheezing.  GI: See history of present illness. Derm: Negative for rash or itching.  Neuro: Negative for memory loss, confusion.  Endo: Negative for unusual weight change.  Heme: Negative for bruising or bleeding. Allergy: Negative for rash or hives.  Physical Exam: Note: limited exam due to virtual visit General:   Obese female. Alert and oriented. Pleasant and cooperative. Well-nourished and well-developed.  Head:  Normocephalic and atraumatic. Eyes:  Without icterus, sclera clear and conjunctiva pink.  Ears:  Normal auditory acuity. Skin:  Intact without facial significant lesions or rashes. Neurologic:  Alert and oriented x4;  grossly normal neurologically. Psych:  Alert and cooperative. Normal mood and affect. Heme/Lymph/Immune: No excessive bruising noted.

## 2019-04-11 NOTE — Progress Notes (Signed)
cc'ed to pcp and referring

## 2019-04-11 NOTE — Assessment & Plan Note (Signed)
Previous abnormal LFTs with transaminitis that is since improved significantly.  Her ALT is currently 50, otherwise LFTs normal now.  She did have elevated mitochondrial antibody with no other autoimmune changes.  We will proceed with MRI/MRCP as per above.  Recheck labs.  Follow-up in 6 months.

## 2019-04-11 NOTE — Assessment & Plan Note (Signed)
GERD symptoms currently well managed on PPI.  Recommend she continue this and call if any worsening symptoms.

## 2019-04-11 NOTE — Telephone Encounter (Signed)
MRI/MRCP scheduled for 06/01/19 (d/t COVID-19 restrictions) at 10:00am, arrive at 9:00am. NPO 4 hours prior to test. Called and informed pt of appt. Letter mailed.  Will do PA for Medicaid closer to appt date.

## 2019-04-20 ENCOUNTER — Other Ambulatory Visit: Payer: Self-pay

## 2019-04-20 ENCOUNTER — Ambulatory Visit (INDEPENDENT_AMBULATORY_CARE_PROVIDER_SITE_OTHER): Payer: Medicaid Other | Admitting: Orthopedic Surgery

## 2019-04-20 DIAGNOSIS — S86811D Strain of other muscle(s) and tendon(s) at lower leg level, right leg, subsequent encounter: Secondary | ICD-10-CM | POA: Diagnosis not present

## 2019-04-20 DIAGNOSIS — L03115 Cellulitis of right lower limb: Secondary | ICD-10-CM

## 2019-04-20 NOTE — Progress Notes (Signed)
Virtual Visit via Telephone Note  I connected with Heidi Landry on 04/20/19 at 11:30 AM EDT by telephone and verified that I am speaking with the correct person using two identifiers.   I discussed the limitations, risks, security and privacy concerns of performing an evaluation and management service by telephone and the availability of in person appointments. I also discussed with the patient that there may be a patient responsible charge related to this service. The patient expressed understanding and agreed to proceed.   Patient ID: Heidi Landry, female   DOB: 08-Apr-1986, 33 y.o.   MRN: [id number]   FOLLOW UP VISIT    Chief Complaint  Patient presents with  . Knee Problem    RIGHT KNEE INJ S/P REPAIR     Virtual visit follow-up  Status post traumatic laceration to the right lower extremity which included a patella tendon repair large loss of blood.  Surgery was performed in [place] follow-up was here closer to the patient's home  He developed an cellulitis which was treated with Keflex and Neosporin  She reports that her wound has healed very well she has no redness or tenderness over the incision  He does have some anterior knee pain for which she is doing home exercises  Prior history   33 year old female had a traumatic laceration to her right lower extremity which included a patella tendon injury.   Last visit she was doing well she had a little bit of a wound issue so we put her on an antibiotic/KEFLEX and told her to come back for wound check  She has several scabs over the different areas of the wound but overall looks very good  I told her to use Neosporin she can finish her antibiotics and see me in 3 weeks for wound check  I showed her some knee exercises which are basically heel slides 25 3 times a day to improve her knee motion  She is walking independently she can lift her leg fine.       ROS    Ortho Exam She says she is bending and  straightening her knee very well she has no extensor lag when she straightens her leg out into extension  A/P  Medical decision-making  Encounter Diagnoses  Name Primary?  . Rupture of right patellar tendon, subsequent encounter Yes  . Cellulitis of leg, right      No orders of the defined types were placed in this encounter.    Recommend continue home exercises  Continue Keflex and Neosporin as needed  Follow-up as needed patient has been released     Arther Abbott, MD 04/20/2019 10:08 AM    I discussed the assessment and treatment plan with the patient. The patient was provided an opportunity to ask questions and all were answered. The patient agreed with the plan and demonstrated an understanding of the instructions.   The patient was advised to call back or seek an in-person evaluation if the symptoms worsen or if the condition fails to improve as anticipated.  I provided 5 minutes of non-face-to-face time during this encounter.   Arther Abbott, MD

## 2019-04-22 DIAGNOSIS — H31091 Other chorioretinal scars, right eye: Secondary | ICD-10-CM | POA: Diagnosis not present

## 2019-04-22 DIAGNOSIS — H35051 Retinal neovascularization, unspecified, right eye: Secondary | ICD-10-CM | POA: Diagnosis not present

## 2019-04-22 DIAGNOSIS — H2513 Age-related nuclear cataract, bilateral: Secondary | ICD-10-CM | POA: Diagnosis not present

## 2019-04-22 DIAGNOSIS — B399 Histoplasmosis, unspecified: Secondary | ICD-10-CM | POA: Diagnosis not present

## 2019-04-29 ENCOUNTER — Telehealth: Payer: Self-pay | Admitting: *Deleted

## 2019-04-29 ENCOUNTER — Telehealth: Payer: Self-pay | Admitting: Obstetrics & Gynecology

## 2019-04-29 MED ORDER — NORETHINDRONE 0.35 MG PO TABS: 1.0000 | ORAL_TABLET | Freq: Every day | ORAL | 2 refills | Status: DC

## 2019-04-29 NOTE — Telephone Encounter (Signed)
Pt has not been able to have a pap due to COVID-19 and due to leg injury she had in February. She is wanting to see if her birth control can be refilled.

## 2019-04-29 NOTE — Telephone Encounter (Signed)
Birth control refilled by Dr. Elonda Husky. [place]

## 2019-05-09 ENCOUNTER — Ambulatory Visit (INDEPENDENT_AMBULATORY_CARE_PROVIDER_SITE_OTHER): Payer: Medicaid Other | Admitting: Family

## 2019-05-09 ENCOUNTER — Other Ambulatory Visit: Payer: Self-pay

## 2019-05-09 ENCOUNTER — Encounter: Payer: Self-pay | Admitting: Family

## 2019-05-09 DIAGNOSIS — B373 Candidiasis of vulva and vagina: Secondary | ICD-10-CM

## 2019-05-09 DIAGNOSIS — B3731 Acute candidiasis of vulva and vagina: Secondary | ICD-10-CM

## 2019-05-09 MED ORDER — FLUCONAZOLE 150 MG PO TABS: 150.0000 mg | ORAL_TABLET | ORAL | 0 refills | Status: DC | PRN

## 2019-05-09 NOTE — Progress Notes (Signed)
   Virtual Visit via telephone Note  I connected with Heidi Landry on 05/09/19 at 12:15 pm by telephone and verified that I am speaking with the correct person using two identifiers. Heidi Landry is currently located in the car  and son is currently with her during visit. The provider, Evelina Dun, FNP is located in their office at time of visit.  I discussed the limitations, risks, security and privacy concerns of performing an evaluation and management service by telephone and the availability of in person appointments. I also discussed with the patient that there may be a patient responsible charge related to this service. The patient expressed understanding and agreed to proceed.   History and Present Illness:  Vaginal Itching  The patient's primary symptoms include genital itching, a genital odor and vaginal discharge. The patient's pertinent negatives include no vaginal bleeding. This is a recurrent problem. The problem has been gradually worsening. The pain is mild. Pertinent negatives include no back pain, dysuria, fever, flank pain, frequency, hematuria or urgency. The vaginal discharge was thick and white.      Review of Systems  Constitutional: Negative for fever.  Genitourinary: Positive for vaginal discharge. Negative for dysuria, flank pain, frequency, hematuria and urgency.  Musculoskeletal: Negative for back pain.  All other systems reviewed and are negative.    Observations/Objective: No SOB or distress   Assessment and Plan: 1. Vagina, candidiasis Keep clean and dry Eat daily yogurt Cotton underwear RTO In 4 weeks with PCP to discuss BP issues - fluconazole (DIFLUCAN) 150 MG tablet; Take 1 tablet (150 mg total) by mouth every three (3) days as needed.  Dispense: 3 tablet; Refill: 0     I discussed the assessment and treatment plan with the patient. The patient was provided an opportunity to ask questions and all were answered. The patient agreed with the  plan and demonstrated an understanding of the instructions.   The patient was advised to call back or seek an in-person evaluation if the symptoms worsen or if the condition fails to improve as anticipated.  The above assessment and management plan was discussed with the patient. The patient verbalized understanding of and has agreed to the management plan. Patient is aware to call the clinic if symptoms persist or worsen. Patient is aware when to return to the clinic for a follow-up visit. Patient educated on when it is appropriate to go to the emergency department.   Time call ended:  12:26 pm  I provided 11 minutes of non-face-to-face time during this encounter.    Evelina Dun, FNP

## 2019-05-10 ENCOUNTER — Ambulatory Visit (INDEPENDENT_AMBULATORY_CARE_PROVIDER_SITE_OTHER): Payer: Medicaid Other | Admitting: Family

## 2019-05-10 ENCOUNTER — Encounter: Payer: Self-pay | Admitting: Family

## 2019-05-10 ENCOUNTER — Other Ambulatory Visit: Payer: Self-pay

## 2019-05-10 DIAGNOSIS — R319 Hematuria, unspecified: Secondary | ICD-10-CM | POA: Diagnosis not present

## 2019-05-10 DIAGNOSIS — B373 Candidiasis of vulva and vagina: Secondary | ICD-10-CM

## 2019-05-10 DIAGNOSIS — B3731 Acute candidiasis of vulva and vagina: Secondary | ICD-10-CM

## 2019-05-10 MED ORDER — BUTOCONAZOLE NITRATE (1 DOSE) 2 % VA CREA: 5.0000 g | TOPICAL_CREAM | Freq: Every day | VAGINAL | 2 refills | Status: DC

## 2019-05-10 NOTE — Progress Notes (Signed)
   Virtual Visit via telephone Note  I connected with Heidi Landry on 05/10/19 at 3:49 pm by telephone and verified that I am speaking with the correct person using two identifiers. Heidi Landry is currently located at home and no one is currently with her during visit. The provider, Evelina Dun, FNP is located in their office at time of visit.  I discussed the limitations, risks, security and privacy concerns of performing an evaluation and management service by telephone and the availability of in person appointments. I also discussed with the patient that there may be a patient responsible charge related to this service. The patient expressed understanding and agreed to proceed.   History and Present Illness:  PT calls the office today complaining of hematuria that she noticed today. She was diagnosed with a vaginal yeast infection yesterday and start diflucan. She states the itching has improved, but her labia is still very swollen and "raw".  Denies any other UTI symptom.  Dysuria   This is a new problem. The current episode started yesterday. The problem occurs intermittently. The problem has been waxing and waning. The quality of the pain is described as burning. The pain is at a severity of 2/10. The pain is mild. There has been no fever. Associated symptoms include hematuria. Pertinent negatives include no discharge, flank pain, frequency, nausea or vomiting. She has tried increased fluids (difluican) for the symptoms.      Review of Systems  Gastrointestinal: Negative for nausea and vomiting.  Genitourinary: Positive for dysuria and hematuria. Negative for flank pain and frequency.     Observations/Objective: No SOB or distress noted  Assessment and Plan: 1. Vagina, candidiasis Keep clean and dry Do not scratch Take other Diflucan in 2 days from now, use vaginal cream Avoid irritants  - Butoconazole Nitrate, 1 Dose, 2 % CREA; Place 5 g vaginally at bedtime.  Dispense:  10 g; Refill: 2  2. Hematuria, unspecified type I believe this is related from her labia becoming so inflamed related to her yeast infection. If she develops any UTI symptoms such as pressure, flank pain, urgency, or frequency she will call office.     I discussed the assessment and treatment plan with the patient. The patient was provided an opportunity to ask questions and all were answered. The patient agreed with the plan and demonstrated an understanding of the instructions.   The patient was advised to call back or seek an in-person evaluation if the symptoms worsen or if the condition fails to improve as anticipated.  The above assessment and management plan was discussed with the patient. The patient verbalized understanding of and has agreed to the management plan. Patient is aware to call the clinic if symptoms persist or worsen. Patient is aware when to return to the clinic for a follow-up visit. Patient educated on when it is appropriate to go to the emergency department.   Time call ended:  4:02 pm  I provided 13 minutes of non-face-to-face time during this encounter.    Evelina Dun, FNP

## 2019-05-12 DIAGNOSIS — G4733 Obstructive sleep apnea (adult) (pediatric): Secondary | ICD-10-CM | POA: Diagnosis not present

## 2019-05-13 ENCOUNTER — Other Ambulatory Visit: Payer: Self-pay | Admitting: Family Medicine

## 2019-05-13 DIAGNOSIS — M545 Low back pain, unspecified: Secondary | ICD-10-CM

## 2019-05-16 ENCOUNTER — Other Ambulatory Visit: Payer: Self-pay | Admitting: Family Medicine

## 2019-05-16 DIAGNOSIS — M545 Low back pain, unspecified: Secondary | ICD-10-CM

## 2019-05-18 ENCOUNTER — Telehealth: Payer: Self-pay

## 2019-05-18 NOTE — Telephone Encounter (Signed)
MRI/MRCP approved. PA# [id number], valid 05/18/19-11/14/19.

## 2019-05-18 NOTE — Telephone Encounter (Signed)
PA for MRI/MRCP submitted via [facility] website. Case pending. Service order: [id number]. Clinical notes faxed to [facility].

## 2019-05-21 ENCOUNTER — Other Ambulatory Visit: Payer: Self-pay | Admitting: Family Medicine

## 2019-05-21 DIAGNOSIS — M545 Low back pain, unspecified: Secondary | ICD-10-CM

## 2019-06-01 ENCOUNTER — Ambulatory Visit (HOSPITAL_COMMUNITY): Payer: Medicaid Other

## 2019-06-08 ENCOUNTER — Ambulatory Visit: Payer: Medicaid Other | Admitting: Family Medicine

## 2019-06-15 ENCOUNTER — Telehealth: Payer: Self-pay | Admitting: Internal Medicine

## 2019-06-15 NOTE — Telephone Encounter (Signed)
[phone] PLEASE CALL PATIENT, SHE NEEDS [facility] TO WRITE A LETTER DESCRIBING HER CONDITION FOR AN IMMIGRATION LAW OFFICE.

## 2019-06-15 NOTE — Telephone Encounter (Signed)
Forwarding to Walden Field, NP who saw pt on 04/11/2019.

## 2019-06-15 NOTE — Telephone Encounter (Signed)
PT IS A DR. [name] PT.

## 2019-06-15 NOTE — Telephone Encounter (Signed)
Forwarding to Dr. Oneida Alar to advise!

## 2019-06-18 ENCOUNTER — Other Ambulatory Visit: Payer: Self-pay | Admitting: Family Medicine

## 2019-06-18 DIAGNOSIS — J3089 Other allergic rhinitis: Secondary | ICD-10-CM

## 2019-06-20 ENCOUNTER — Ambulatory Visit (INDEPENDENT_AMBULATORY_CARE_PROVIDER_SITE_OTHER): Payer: Medicaid Other | Admitting: Orthopedic Surgery

## 2019-06-20 ENCOUNTER — Encounter: Payer: Self-pay | Admitting: Orthopedic Surgery

## 2019-06-20 ENCOUNTER — Encounter: Payer: Self-pay | Admitting: Family Medicine

## 2019-06-20 ENCOUNTER — Other Ambulatory Visit: Payer: Self-pay

## 2019-06-20 VITALS — BP 152/88 | HR 116 | Temp 97.3°F | Ht 62.0 in | Wt 330.0 lb

## 2019-06-20 DIAGNOSIS — S8991XS Unspecified injury of right lower leg, sequela: Secondary | ICD-10-CM | POA: Diagnosis not present

## 2019-06-20 DIAGNOSIS — Z6841 Body Mass Index (BMI) 40.0 and over, adult: Secondary | ICD-10-CM | POA: Diagnosis not present

## 2019-06-20 NOTE — Patient Instructions (Signed)
Home exercises

## 2019-06-20 NOTE — Progress Notes (Signed)
Progress Note   Patient ID: Heidi Landry, female   DOB: 1986-05-16, 33 y.o.   MRN: [id number]   Chief Complaint  Patient presents with  . Knee Problem    right knee injury 02/04/19 feels like it gives out couple times a day     Encounter Diagnoses  Name Primary?  . Injury of right knee, sequela Yes  . Body mass index 60.0-69.9, adult ([place])   . Morbid obesity ([facility])     No Known Allergies   Current Outpatient Medications:  .  albuterol (PROVENTIL HFA;VENTOLIN HFA) 108 (90 Base) MCG/ACT inhaler, Inhale 2 puffs into the lungs every 6 (six) hours as needed for wheezing or shortness of breath., Disp: 1 Inhaler, Rfl: 5 .  atorvastatin (LIPITOR) 20 MG tablet, Take 1 tablet (20 mg total) by mouth daily., Disp: 90 tablet, Rfl: 3 .  bevacizumab (AVASTIN) 400 MG/16ML SOLN, Inject into the vein once. Every 8 weeks injected into right eye., Disp: , Rfl:  .  buPROPion (WELLBUTRIN SR) 150 MG 12 hr tablet, Take 1 tablet (150 mg total) by mouth 2 (two) times daily., Disp: 180 tablet, Rfl: 0 .  Butoconazole Nitrate, 1 Dose, 2 % CREA, Place 5 g vaginally at bedtime., Disp: 10 g, Rfl: 2 .  BYDUREON 2 MG PEN, INJECT 2MG INTO THE SKIN ONCE A WEEK. (Patient taking differently: On Saturday), Disp: 12 each, Rfl: 1 .  cyclobenzaprine (FLEXERIL) 10 MG tablet, Take 1 tablet (10 mg total) by mouth 3 (three) times daily as needed. for muscle spams, Disp: 30 tablet, Rfl: 2 .  dicyclomine (BENTYL) 20 MG tablet, Take 1 tablet (20 mg total) by mouth 4 (four) times daily as needed for spasms., Disp: 60 tablet, Rfl: 3 .  EQ ALLERGY RELIEF, CETIRIZINE, 10 MG tablet, Take 1 tablet by mouth once daily, Disp: 90 tablet, Rfl: 0 .  fluconazole (DIFLUCAN) 150 MG tablet, Take 1 tablet (150 mg total) by mouth every three (3) days as needed., Disp: 3 tablet, Rfl: 0 .  fluticasone (FLONASE) 50 MCG/ACT nasal spray, Place 2 sprays into both nostrils daily., Disp: 16 g, Rfl: 6 .  hydrochlorothiazide (HYDRODIURIL) 12.5 MG tablet, Take 1  tablet (12.5 mg total) by mouth daily., Disp: 90 tablet, Rfl: 1 .  metFORMIN (GLUCOPHAGE-XR) 500 MG 24 hr tablet, TAKE 2 TABLETS BY MOUTH TWICE DAILY, Disp: 360 tablet, Rfl: 0 .  metoprolol succinate (TOPROL-XL) 25 MG 24 hr tablet, TAKE 1 TABLET BY MOUTH ONCE DAILY WITH  OR  IMMEDIATELY  FOLLOWING  A  MEAL, Disp: 90 tablet, Rfl: 1 .  norethindrone (ORTHO MICRONOR) 0.35 MG tablet, Take 1 tablet (0.35 mg total) by mouth daily., Disp: 1 Package, Rfl: 2 .  omeprazole (PRILOSEC) 40 MG capsule, Take 1 capsule by mouth once daily, Disp: 90 capsule, Rfl: 1 .  podofilox (CONDYLOX) 0.5 % gel, Apply topically 2 (two) times daily. (Patient taking differently: Apply topically as needed. ), Disp: 3.5 g, Rfl: 11 .  senna-docusate (SENOKOT-S) 8.6-50 MG tablet, Take 1 tablet by mouth 2 (two) times daily at 10 AM and 5 PM. Takes every other day, Disp: , Rfl:  .  sertraline (ZOLOFT) 50 MG tablet, Take 3 tablets (150 mg total) by mouth daily., Disp: 270 tablet, Rfl: 1 .  spironolactone (ALDACTONE) 100 MG tablet, Take 1 tablet (100 mg total) by mouth daily., Disp: 90 tablet, Rfl: 46   33 year old female was operated on in [place], referral came in from primary care for further management.  She fell  through a tile floor sustained a horseshoe-like laceration around her right knee area.  She says she had ligament and tendon damage which required surgery.  No notes available.  She was told she could not bend her knee for 6 weeks  She was injured in February and had surgery at that time she is here for follow-up visit.  She comes in for giving way symptoms of the right knee.      Review of Systems  Musculoskeletal: Negative for joint pain.      BP (!) 152/88   Pulse (!) 116   Temp (!) 97.3 F (36.3 C)   Ht 5' 2"  (1.575 m)   Wt (!) 330 lb (149.7 kg)   BMI 60.36 kg/m   Physical Exam Musculoskeletal:       Legs:  Neurological:     Gait: Gait is intact.      Medical decisions:  (Established problem  worse, x-ray ,physical therapy, over-the-counter medicines, read outside film or summarize x-ray)   Encounter Diagnoses  Name Primary?  . Injury of right knee, sequela Yes  . Body mass index 60.0-69.9, adult ([place])   . Morbid obesity ([place])    The patient meets the AMA guidelines for Morbid (severe) obesity with a BMI > 40.0 and I have recommended weight loss.  PLAN:   The patient has Medicaid she will only qualify for 3 physical therapy visits so I have asked her to perform a home exercise program to strengthen her quadriceps  She also asked for a letter for the lawyer regarding an immigration issue with her husband.  To whom it may concern this is a 33 year old female had a traumatic laceration to her right knee which involved transection of the patellar tendon large laceration with extensive blood loss requiring transfusion.  She was treated in [place] for this and her operative reports can be reviewed for details of the surgery.  She presented here for wound check and further management.  She has progressed well with the exception of occasional giving way of the right knee.  Although her ligaments are stable she has quadriceps weakness which is expected.  She is advised to perform home exercises to strengthen her quadriceps.  She is also noted to have a BMI of 60 she has natural hyperextension of her knee which contributes to her tend to give way in the setting of the repair of her tendon  Sincerely  Arther Abbott, Brooke Bonito., MD    Arther Abbott, MD 06/20/2019 4:18 PM

## 2019-06-21 NOTE — Telephone Encounter (Signed)
PT is aware that Walden Field, NP will be doing the letter. He has been out of the office some and will get to it when he can. She just needs a letter to : To Whom It Concerns, stating when she was seen and diagnosed and something about her condition. She will pick up the letter when it is completed.

## 2019-06-27 ENCOUNTER — Encounter: Payer: Self-pay | Admitting: Nurse Practitioner

## 2019-06-27 NOTE — Progress Notes (Signed)
Per patient request, letter completed related to her medical conditions.

## 2019-06-27 NOTE — Telephone Encounter (Signed)
LMOM that letter was complete. Letter placed at front along with printed info from [facility] about her condition/diagnosis.

## 2019-06-27 NOTE — Telephone Encounter (Signed)
Completed per patient request.

## 2019-07-04 ENCOUNTER — Other Ambulatory Visit: Payer: Self-pay | Admitting: Family Medicine

## 2019-07-04 DIAGNOSIS — Z720 Tobacco use: Secondary | ICD-10-CM

## 2019-07-04 DIAGNOSIS — K219 Gastro-esophageal reflux disease without esophagitis: Secondary | ICD-10-CM

## 2019-07-13 ENCOUNTER — Other Ambulatory Visit: Payer: Self-pay | Admitting: Obstetrics and Gynecology

## 2019-07-13 MED ORDER — NORETHINDRONE 0.35 MG PO TABS: 1.0000 | ORAL_TABLET | Freq: Every day | ORAL | 2 refills | Status: DC

## 2019-07-13 NOTE — Addendum Note (Signed)
Addended by: Christiana Pellant A on: 07/13/2019 03:48 PM   Modules accepted: Orders

## 2019-07-13 NOTE — Telephone Encounter (Signed)
Patient called, she rescheduled her p/p for August.  Stated she had to cx due to a really bad leg injury and then COVID.  She is needing a refill on her bc.  [facility] [place]  [phone]

## 2019-07-13 NOTE — Progress Notes (Signed)
Reorder norethindrone x 3 months.

## 2019-08-07 ENCOUNTER — Other Ambulatory Visit: Payer: Self-pay | Admitting: Family Medicine

## 2019-08-07 DIAGNOSIS — K219 Gastro-esophageal reflux disease without esophagitis: Secondary | ICD-10-CM

## 2019-08-07 DIAGNOSIS — Z720 Tobacco use: Secondary | ICD-10-CM

## 2019-08-08 NOTE — Telephone Encounter (Signed)
Rakes. NTBS 30 days given 07/05/19

## 2019-08-08 NOTE — Telephone Encounter (Signed)
Patient states she will be out of wellbutrin in 2-3 days.  Televisit appt made

## 2019-08-08 NOTE — Telephone Encounter (Signed)
Will refill during upcoming appointment.

## 2019-08-11 ENCOUNTER — Encounter: Payer: Self-pay | Admitting: Family Medicine

## 2019-08-11 ENCOUNTER — Other Ambulatory Visit (HOSPITAL_COMMUNITY)
Admission: RE | Admit: 2019-08-11 | Discharge: 2019-08-11 | Disposition: A | Discharge status: Home / Self Care | Payer: Medicaid Other | Source: Ambulatory Visit | Attending: Obstetrics and Gynecology | Admitting: Obstetrics and Gynecology

## 2019-08-11 ENCOUNTER — Ambulatory Visit (INDEPENDENT_AMBULATORY_CARE_PROVIDER_SITE_OTHER): Payer: Medicaid Other | Admitting: Family Medicine

## 2019-08-11 ENCOUNTER — Ambulatory Visit (INDEPENDENT_AMBULATORY_CARE_PROVIDER_SITE_OTHER): Payer: Medicaid Other | Admitting: Obstetrics and Gynecology

## 2019-08-11 ENCOUNTER — Encounter: Payer: Self-pay | Admitting: Obstetrics and Gynecology

## 2019-08-11 ENCOUNTER — Other Ambulatory Visit: Payer: Self-pay

## 2019-08-11 VITALS — BP 141/82 | HR 97 | Ht 62.0 in | Wt 335.0 lb

## 2019-08-11 DIAGNOSIS — K219 Gastro-esophageal reflux disease without esophagitis: Secondary | ICD-10-CM

## 2019-08-11 DIAGNOSIS — Z0000 Encounter for general adult medical examination without abnormal findings: Secondary | ICD-10-CM

## 2019-08-11 DIAGNOSIS — F339 Major depressive disorder, recurrent, unspecified: Secondary | ICD-10-CM

## 2019-08-11 DIAGNOSIS — Z01419 Encounter for gynecological examination (general) (routine) without abnormal findings: Secondary | ICD-10-CM | POA: Diagnosis not present

## 2019-08-11 DIAGNOSIS — E11319 Type 2 diabetes mellitus with unspecified diabetic retinopathy without macular edema: Secondary | ICD-10-CM | POA: Diagnosis not present

## 2019-08-11 MED ORDER — METRONIDAZOLE 500 MG PO TABS: 500.0000 mg | ORAL_TABLET | Freq: Two times a day (BID) | ORAL | 0 refills | Status: AC

## 2019-08-11 MED ORDER — OMEPRAZOLE 40 MG PO CPDR: 40.0000 mg | DELAYED_RELEASE_CAPSULE | Freq: Every day | ORAL | 0 refills | Status: DC

## 2019-08-11 MED ORDER — BUPROPION HCL ER (SR) 150 MG PO TB12: 150.0000 mg | ORAL_TABLET | Freq: Two times a day (BID) | ORAL | 0 refills | Status: DC

## 2019-08-11 MED ORDER — METFORMIN HCL ER 500 MG PO TB24: ORAL_TABLET | ORAL | 0 refills | Status: DC

## 2019-08-11 NOTE — Progress Notes (Signed)
Patient ID: Heidi Landry, female   DOB: [birth year], 33 y.o.   MRN: [id number]  Assessment:  1.   Annual Gyn Exam 2.   Obesity Body mass index is 61.27 kg/m.  3.   BTB while on Norethindrone 4 morbid obesity s/p traumatic injury to right shin and thigh   Plan:  1. Pap smear done, next pap due in 3 years 2. Return annually or prn 3    Annual mammogram advised after age 11 4.   Take a 5 day break from Norethindrone, then restart 5.   Refill Rx Flagyl Subjective:  Heidi Landry is a 33 y.o. female G1P0101 who presents for annual exam. No LMP recorded. The patient reports that she was losing weight before the covid-19 pandemic, but then she injured her knee, fell thru floor. and had to stop exercising. Now, her knee is weak so she is trying to get her strength back.   She reports that the pharmacy recently switched her to the generic brand of Norethindrone. Since then, her periods have been irregular. She is still struggling with BV, for which she uses Flagyl.  Her diabetes is managed by Josie Saunders and she is using Exenatide injections.  The following portions of the patient's history were reviewed and updated as appropriate: allergies, current medications, past family history, past medical history, past social history, past surgical history and problem list. Past Medical History:  Diagnosis Date  . Anxiety   . Atrial fibrillation ([place])   . Carpal tunnel syndrome, bilateral   . Chest pain   . Chronic back pain   . Degenerative disc disease   . Edema   . Gestational diabetes mellitus, antepartum   . Gestational diabetes mellitus, class A2/B 08/08/2015   Early 2hr: 84/186/117 @ 12wks   A1/B  Notified & dietician referral ordered 08/08/15   . Hypertension   . IBS (irritable bowel syndrome)   . Panic attacks   . Placental abruption in third trimester 12/07/2015  . Post partum depression   . Sleep apnea   . Spondylosis   . Tendonitis   . Thoracic spine fracture ([place]) 04/13/2012   . Varicose veins of left lower extremity with complications 50/35/4656    Past Surgical History:  Procedure Laterality Date  . CESAREAN SECTION N/A 12/08/2015   Procedure: CESAREAN SECTION;  Surgeon: Jonnie Kind, MD;  Location: [facility];  Service: Obstetrics;  Laterality: N/A;  . CHOLECYSTECTOMY    . KNEE SURGERY Right 01/29/2019   at [facility] in [place], [place]; s/p fall and deel leg laceration     Current Outpatient Medications:  .  albuterol (PROVENTIL HFA;VENTOLIN HFA) 108 (90 Base) MCG/ACT inhaler, Inhale 2 puffs into the lungs every 6 (six) hours as needed for wheezing or shortness of breath., Disp: 1 Inhaler, Rfl: 5 .  atorvastatin (LIPITOR) 20 MG tablet, Take 1 tablet (20 mg total) by mouth daily., Disp: 90 tablet, Rfl: 3 .  bevacizumab (AVASTIN) 400 MG/16ML SOLN, Inject into the vein once. Every 8 weeks injected into right eye., Disp: , Rfl:  .  buPROPion (WELLBUTRIN SR) 150 MG 12 hr tablet, Take 1 tablet (150 mg total) by mouth 2 (two) times daily. (Needs to be seen before next refill), Disp: 60 tablet, Rfl: 0 .  Butoconazole Nitrate, 1 Dose, 2 % CREA, Place 5 g vaginally at bedtime., Disp: 10 g, Rfl: 2 .  BYDUREON 2 MG PEN, INJECT 2MG INTO THE SKIN ONCE A WEEK. (Patient taking differently:  On Saturday), Disp: 12 each, Rfl: 1 .  cyclobenzaprine (FLEXERIL) 10 MG tablet, Take 1 tablet (10 mg total) by mouth 3 (three) times daily as needed. for muscle spams, Disp: 30 tablet, Rfl: 2 .  dicyclomine (BENTYL) 20 MG tablet, Take 1 tablet (20 mg total) by mouth 4 (four) times daily as needed for spasms., Disp: 60 tablet, Rfl: 3 .  EQ ALLERGY RELIEF, CETIRIZINE, 10 MG tablet, Take 1 tablet by mouth once daily, Disp: 90 tablet, Rfl: 0 .  fluticasone (FLONASE) 50 MCG/ACT nasal spray, Place 2 sprays into both nostrils daily., Disp: 16 g, Rfl: 6 .  hydrochlorothiazide (HYDRODIURIL) 12.5 MG tablet, Take 1 tablet (12.5 mg total) by mouth daily., Disp: 90 tablet, Rfl: 1 .   metFORMIN (GLUCOPHAGE-XR) 500 MG 24 hr tablet, TAKE 2 TABLETS BY MOUTH TWICE DAILY, Disp: 360 tablet, Rfl: 0 .  metoprolol succinate (TOPROL-XL) 25 MG 24 hr tablet, TAKE 1 TABLET BY MOUTH ONCE DAILY WITH  OR  IMMEDIATELY  FOLLOWING  A  MEAL, Disp: 90 tablet, Rfl: 1 .  norethindrone (ORTHO MICRONOR) 0.35 MG tablet, Take 1 tablet (0.35 mg total) by mouth daily., Disp: 1 Package, Rfl: 2 .  omeprazole (PRILOSEC) 40 MG capsule, Take 1 capsule (40 mg total) by mouth daily. (Needs to be seen before next refill), Disp: 30 capsule, Rfl: 0 .  senna-docusate (SENOKOT-S) 8.6-50 MG tablet, Take 1 tablet by mouth 2 (two) times daily at 10 AM and 5 PM. Takes every other day, Disp: , Rfl:  .  sertraline (ZOLOFT) 50 MG tablet, Take 3 tablets (150 mg total) by mouth daily., Disp: 270 tablet, Rfl: 1 .  spironolactone (ALDACTONE) 100 MG tablet, Take 1 tablet (100 mg total) by mouth daily., Disp: 90 tablet, Rfl: 1 .  fluconazole (DIFLUCAN) 150 MG tablet, Take 1 tablet (150 mg total) by mouth every three (3) days as needed., Disp: 3 tablet, Rfl: 0 .  podofilox (CONDYLOX) 0.5 % gel, Apply topically 2 (two) times daily. (Patient not taking: Reported on 08/11/2019), Disp: 3.5 g, Rfl: 11  Review of Systems Constitutional: negative Gastrointestinal: negative Genitourinary: negative  Objective:  BP (!) 141/82 (BP Location: Right Arm, Patient Position: Sitting, Cuff Size: Large)   Pulse 97   Ht 5' 2"  (1.575 m)   Wt (!) 335 lb (152 kg)   BMI 61.27 kg/m    BMI: Body mass index is 61.27 kg/m.  General Appearance: Alert, appropriate appearance for age. No acute distress HEENT: Grossly normal Neck / Thyroid:  Cardiovascular: RRR; normal S1, S2, no murmur Lungs: CTA bilaterally Back: No CVAT Breast Exam: No dimpling, nipple retraction or discharge. No masses or nodes., Normal to inspection and No masses or nodes.No dimpling, nipple retraction or discharge. Gastrointestinal: Soft, non-tender, no masses or  organomegaly Pelvic Exam: Vulva and vagina appear normal. Bimanual exam reveals normal uterus and adnexa. Largest snowman speculum used without clear visibility of cervix. Blind sweep guided by physician's index finger to obtain vaginal vault sample. Rectovaginal: Deferred Lymphatic Exam: Non-palpable nodes in neck, clavicular, axillary, or inguinal regions  Skin: no rash or abnormalities, suture extruding from right shin removed Neurologic: Normal gait and speech, no tremor  Psychiatric: Alert and oriented, appropriate affect.  Urinalysis:Not done  Mallory Shirk. MD Pgr [phone] 1:58 PM  By signing my name below, I, De Burrs, attest that this documentation has been prepared under the direction and in the presence of Jonnie Kind, MD. Electronically Signed: De Burrs, Medical Scribe. 08/11/19. 1:58 PM.  I personally performed the services described in this documentation, which was SCRIBED in my presence. The recorded information has been reviewed and considered accurate. It has been edited as necessary during review. Jonnie Kind, MD

## 2019-08-11 NOTE — Progress Notes (Signed)
Virtual Visit via telephone Note Due to COVID-19 pandemic this visit was conducted virtually. This visit type was conducted due to national recommendations for restrictions regarding the COVID-19 Pandemic (e.g. social distancing, sheltering in place) in an effort to limit this patient's exposure and mitigate transmission in our community. All issues noted in this document were discussed and addressed.  A physical exam was not performed with this format.   I connected with Heidi Landry on 08/11/19 at 0925 by telephone and verified that I am speaking with the correct person using two identifiers. Heidi Landry is currently located at home and son is currently with them during visit. The provider, Monia Pouch, FNP is located in their office at time of visit.  I discussed the limitations, risks, security and privacy concerns of performing an evaluation and management service by telephone and the availability of in person appointments. I also discussed with the patient that there may be a patient responsible charge related to this service. The patient expressed understanding and agreed to proceed.  Subjective:  Patient ID: Heidi Landry, female    DOB: [birth year], 33 y.o.   MRN: [id number]  Chief Complaint:  Medical Management of Chronic Issues   HPI: Heidi Landry is a 33 y.o. female presenting on 08/11/2019 for Medical Management of Chronic Issues   1. Depression, recurrent ([place]) Doing very well on current medications. No SI or HI. Does have fatigue / lack of energy. Notices this more is she misses a dose of her medications. Compliant with medications 99% of the time. No associated side effects.  Depression screen [facility] 2/9 08/11/2019 03/07/2019 02/16/2019 12/06/2018 10/06/2018  Decreased Interest 0 0 0 0 0  Down, Depressed, Hopeless 0 0 0 0 0  PHQ - 2 Score 0 0 0 0 0  Altered sleeping 1 - - - -  Tired, decreased energy 1 - - - -  Change in appetite 0 - - - -  Feeling bad or failure about  yourself  0 - - - -  Trouble concentrating 0 - - - -  Moving slowly or fidgety/restless 0 - - - -  Suicidal thoughts 0 - - - -  PHQ-9 Score 2 - - - -  Difficult doing work/chores - - - - -  Some recent data might be hidden     2. 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 female who presents for an follow up evaluation of Type 2 diabetes mellitus.  Current symptoms include none. Patient denies foot ulcerations, hyperglycemia, hyperglycemia weakness, increased appetite, nausea, paresthesia of the feet, polydipsia, polyuria, visual disturbances, vomiting and weight loss.  Current diabetic medications include bydureon, metformin.  Compliant with meds - Yes  Current monitoring regimen: home blood tests - several times weekly Home blood sugar records: trend: stable Any episodes of hypoglycemia? no  Known diabetic complications: retinopathy, cardiovascular disease and peripheral vascular disease Cardiovascular risk factors: diabetes mellitus, dyslipidemia, hypertension, obesity (BMI >= 30 kg/m2), sedentary lifestyle and smoking/ tobacco exposure Eye exam current (within one year): no Podiatry yearly?  No  Weight trend: stable Prior visit with CDE: no Current diet: in general, an "unhealthy" diet Current exercise: walking  PNA Vaccine UTD?  Yes Hep B Vaccine?  Yes Tdap Vaccine UTD?  No  Is She on ACE inhibitor or angiotensin II receptor blocker?  No   Is She on statin?  No Is She on ASA 81 mg daily?  No  Lab Review Glucose (mg/dL)  Date Value  03/07/2019 117 (H)  12/20/2018 76  08/12/2018 200 (H)   Glucose, Bld (mg/dL)  Date Value  12/06/2015 98  02/26/2015 96  10/16/2014 99   CO2 (mmol/L)  Date Value  03/07/2019 24  12/20/2018 26  08/12/2018 26   BUN (mg/dL)  Date Value  03/07/2019 15  12/20/2018 10   08/12/2018 13   Creatinine, Ser (mg/dL)  Date Value  03/07/2019 0.76  12/20/2018 0.68  08/12/2018 0.60   Lab Results  Component Value Date   HGBA1C 5.0 03/07/2019   HGBA1C 5.9 12/06/2018   HGBA1C 7.3 (H) 08/26/2018     3. GERD without esophagitis Compliant with medications - Yes Current medications - omeprazole Adverse side effects - No Cough - No Sore throat - No Voice change - No Hemoptysis - No Dysphagia or dyspepsia - No Water brash - No Red Flags (weight loss, hematochezia, melena, weight loss, early satiety, fevers, odynophagia, or persistent vomiting) - No  Relevant past medical, surgical, family, and social history reviewed and updated as indicated.  Allergies and medications reviewed and updated.   Past Medical History:  Diagnosis Date  . Anxiety   . Atrial fibrillation ([place])   . Carpal tunnel syndrome, bilateral   . Chest pain   . Chronic back pain   . Degenerative disc disease   . Edema   . Gestational diabetes mellitus, antepartum   . Gestational diabetes mellitus, class A2/B 08/08/2015   Early 2hr: 84/186/117 @ 12wks   A1/B  Notified & dietician referral ordered 08/08/15   . Hypertension   . IBS (irritable bowel syndrome)   . Panic attacks   . Placental abruption in third trimester 12/07/2015  . Post partum depression   . Sleep apnea   . Spondylosis   . Tendonitis   . Thoracic spine fracture ([place]) 04/13/2012  . Varicose veins of left lower extremity with complications 41/96/2229    Past Surgical History:  Procedure Laterality Date  . CESAREAN SECTION N/A 12/08/2015   Procedure: CESAREAN SECTION;  Surgeon: Jonnie Kind, MD;  Location: [facility];  Service: Obstetrics;  Laterality: N/A;  . CHOLECYSTECTOMY    . KNEE SURGERY Right 01/29/2019   at [facility] in [place], [place]; s/p fall and deel leg laceration    Social History   Socioeconomic History  . Marital status: Married    Spouse name: Not on file  . Number of  children: Not on file  . Years of education: 32  . Highest education level: Not on file  Occupational History    Employer: [employer]  Social Needs  . Financial resource strain: Not on file  . Food insecurity    Worry: Not on file    Inability: Not on file  . Transportation needs    Medical: Not on file    Non-medical: Not on file  Tobacco Use  . Smoking status: Current Every Day Smoker    Packs/day: 0.50    Years: 9.00    Pack years: 4.50    Types: Cigarettes    Start date: 05/25/2006  . Smokeless tobacco: Never Used  Substance and Sexual Activity  . Alcohol use: No    Alcohol/week: 0.0 standard drinks  . Drug use: No  . Sexual activity: Yes    Birth control/protection: Pill  Lifestyle  . Physical activity    Days per week:  Not on file    Minutes per session: Not on file  . Stress: Not on file  Relationships  . Social Herbalist on phone: Not on file    Gets together: Not on file    Attends religious service: Not on file    Active member of club or organization: Not on file    Attends meetings of clubs or organizations: Not on file    Relationship status: Not on file  . Intimate partner violence    Fear of current or ex partner: Not on file    Emotionally abused: Not on file    Physically abused: Not on file    Forced sexual activity: Not on file  Other Topics Concern  . Not on file  Social History Narrative  . Not on file    Outpatient Encounter Medications as of 08/11/2019  Medication Sig  . albuterol (PROVENTIL HFA;VENTOLIN HFA) 108 (90 Base) MCG/ACT inhaler Inhale 2 puffs into the lungs every 6 (six) hours as needed for wheezing or shortness of breath.  Marland Kitchen atorvastatin (LIPITOR) 20 MG tablet Take 1 tablet (20 mg total) by mouth daily.  . bevacizumab (AVASTIN) 400 MG/16ML SOLN Inject into the vein once. Every 8 weeks injected into right eye.  Marland Kitchen buPROPion (WELLBUTRIN SR) 150 MG 12 hr tablet Take 1 tablet (150 mg total) by mouth 2 (two) times  daily. (Needs to be seen before next refill)  . Butoconazole Nitrate, 1 Dose, 2 % CREA Place 5 g vaginally at bedtime.  Marland Kitchen BYDUREON 2 MG PEN INJECT 2MG INTO THE SKIN ONCE A WEEK. (Patient taking differently: On Saturday)  . cyclobenzaprine (FLEXERIL) 10 MG tablet Take 1 tablet (10 mg total) by mouth 3 (three) times daily as needed. for muscle spams  . dicyclomine (BENTYL) 20 MG tablet Take 1 tablet (20 mg total) by mouth 4 (four) times daily as needed for spasms.  Heidi Landry ALLERGY RELIEF, CETIRIZINE, 10 MG tablet Take 1 tablet by mouth once daily  . fluconazole (DIFLUCAN) 150 MG tablet Take 1 tablet (150 mg total) by mouth every three (3) days as needed.  . fluticasone (FLONASE) 50 MCG/ACT nasal spray Place 2 sprays into both nostrils daily.  . hydrochlorothiazide (HYDRODIURIL) 12.5 MG tablet Take 1 tablet (12.5 mg total) by mouth daily.  . metFORMIN (GLUCOPHAGE-XR) 500 MG 24 hr tablet TAKE 2 TABLETS BY MOUTH TWICE DAILY  . metoprolol succinate (TOPROL-XL) 25 MG 24 hr tablet TAKE 1 TABLET BY MOUTH ONCE DAILY WITH  OR  IMMEDIATELY  FOLLOWING  A  MEAL  . norethindrone (ORTHO MICRONOR) 0.35 MG tablet Take 1 tablet (0.35 mg total) by mouth daily.  Marland Kitchen omeprazole (PRILOSEC) 40 MG capsule Take 1 capsule (40 mg total) by mouth daily. (Needs to be seen before next refill)  . podofilox (CONDYLOX) 0.5 % gel Apply topically 2 (two) times daily. (Patient taking differently: Apply topically as needed. )  . senna-docusate (SENOKOT-S) 8.6-50 MG tablet Take 1 tablet by mouth 2 (two) times daily at 10 AM and 5 PM. Takes every other day  . sertraline (ZOLOFT) 50 MG tablet Take 3 tablets (150 mg total) by mouth daily.  Marland Kitchen spironolactone (ALDACTONE) 100 MG tablet Take 1 tablet (100 mg total) by mouth daily.  . [DISCONTINUED] buPROPion (WELLBUTRIN SR) 150 MG 12 hr tablet Take 1 tablet (150 mg total) by mouth 2 (two) times daily. (Needs to be seen before next refill)  . [DISCONTINUED] metFORMIN (GLUCOPHAGE-XR) 500 MG 24 hr  tablet TAKE 2 TABLETS BY MOUTH TWICE DAILY  . [DISCONTINUED] omeprazole (PRILOSEC) 40 MG capsule Take 1 capsule (40 mg total) by mouth daily. (Needs to be seen before next refill)   No facility-administered encounter medications on file as of 08/11/2019.     No Known Allergies  Review of Systems  Constitutional: Positive for fatigue. Negative for activity change, appetite change, chills, diaphoresis, fever and unexpected weight change.  HENT: Negative.  Negative for sore throat, trouble swallowing and voice change.   Eyes: Negative.  Negative for photophobia and visual disturbance.  Respiratory: Negative for cough, choking, chest tightness, shortness of breath and wheezing.   Cardiovascular: Negative for chest pain, palpitations and leg swelling.  Gastrointestinal: Negative for abdominal pain, anal bleeding, blood in stool, constipation, diarrhea, nausea and vomiting.  Endocrine: Negative.  Negative for polydipsia, polyphagia and polyuria.  Genitourinary: Negative for decreased urine volume, difficulty urinating, dysuria, frequency, hematuria and urgency.  Musculoskeletal: Negative for arthralgias and myalgias.  Skin: Negative.   Allergic/Immunologic: Negative.   Neurological: Negative for dizziness, tremors, seizures, syncope, facial asymmetry, speech difficulty, weakness, light-headedness, numbness and headaches.  Hematological: Negative.  Does not bruise/bleed easily.  Psychiatric/Behavioral: Negative for confusion, hallucinations, sleep disturbance and suicidal ideas.  All other systems reviewed and are negative.        Observations/Objective: No vital signs or physical exam, this was a telephone or virtual health encounter.  Pt alert and oriented, answers all questions appropriately, and able to speak in full sentences.    Assessment and Plan: Deiona was seen today for medical management of chronic issues.  Diagnoses and all orders for this visit:  Depression, recurrent  ([place]) Pt doing well on sertraline and bupropion. No SI or HI. Some fatigue but improving. Report any new or worsening symptoms. Medications as prescribed.  -     buPROPion (WELLBUTRIN SR) 150 MG 12 hr tablet; Take 1 tablet (150 mg total) by mouth 2 (two) times daily. 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 BS well controlled per pts report. Last A1C was 5 in 02/2019. Pt to follow up in 2 months for repeat blood work.  -     metFORMIN (GLUCOPHAGE-XR) 500 MG 24 hr tablet; TAKE 2 TABLETS BY MOUTH TWICE DAILY  GERD without esophagitis Diet discussed. Avoid fried, spicy, fatty, and acidic foods. Avoid caffeine, nicotine, and alcohol. Do not eat 2-3 hours before bedtime and stay upright for at least 1-2 hours after eating. Eat small frequent meals. Avoid NSAID's like motrin and aleve. Medications as prescribed. Report any new or worsening symptoms. Follow up as discussed or sooner if needed. Continue medications as prescribed. No red flags present.  -     omeprazole (PRILOSEC) 40 MG capsule; Take 1 capsule (40 mg total) by mouth daily. (Needs to be seen before next refill)     Follow Up Instructions: Return in about 2 months (around 10/11/2019), or if symptoms worsen or fail to improve, for DM, .    I discussed the assessment and treatment plan with the patient. The patient was provided an opportunity to ask questions and all were answered. The patient agreed with the plan and demonstrated an understanding of the instructions.   The patient was advised to call back or seek an in-person evaluation if the symptoms worsen or if the condition fails  to improve as anticipated.  The above assessment and management plan was discussed with the patient. The patient verbalized understanding of and  has agreed to the management plan. Patient is aware to call the clinic if symptoms persist or worsen. Patient is aware when to return to the clinic for a follow-up visit. Patient educated on when it is appropriate to go to the emergency department.    I provided 25 minutes of non-face-to-face time during this encounter. The call started at 0925. The call ended at 0950. The other time was used for coordination of care.    Monia Pouch, FNP-C [facility] [address] [place], [place] [address] [phone] 08/11/19

## 2019-08-16 LAB — CYTOLOGY - PAP
Adequacy: ABSENT
Chlamydia: NEGATIVE
Diagnosis: NEGATIVE
HPV: NOT DETECTED
Neisseria Gonorrhea: NEGATIVE

## 2019-08-19 ENCOUNTER — Other Ambulatory Visit: Payer: Self-pay | Admitting: Family Medicine

## 2019-08-19 DIAGNOSIS — M545 Low back pain, unspecified: Secondary | ICD-10-CM

## 2019-08-24 ENCOUNTER — Other Ambulatory Visit: Payer: Self-pay | Admitting: *Deleted

## 2019-08-24 DIAGNOSIS — I10 Essential (primary) hypertension: Secondary | ICD-10-CM

## 2019-08-24 MED ORDER — METOPROLOL SUCCINATE ER 25 MG PO TB24: ORAL_TABLET | ORAL | 0 refills | Status: DC

## 2019-09-04 ENCOUNTER — Other Ambulatory Visit: Payer: Self-pay | Admitting: Family Medicine

## 2019-09-04 ENCOUNTER — Telehealth: Payer: Self-pay | Admitting: Obstetrics and Gynecology

## 2019-09-04 ENCOUNTER — Other Ambulatory Visit: Payer: Self-pay | Admitting: Obstetrics and Gynecology

## 2019-09-04 DIAGNOSIS — K219 Gastro-esophageal reflux disease without esophagitis: Secondary | ICD-10-CM

## 2019-09-04 NOTE — Telephone Encounter (Signed)
Pt left a message suggesting that she make an appt for NuSwab testing to better diagnose her vaginitis symptoms, as BV does not usually require frequent repeat treatments, so diagnosis is suspect.  If BV is confirmed, would consider periodic tx with Metrogel, as that has worked for chronic recurrent BV in other patients.

## 2019-09-08 ENCOUNTER — Other Ambulatory Visit: Payer: Self-pay | Admitting: *Deleted

## 2019-09-08 DIAGNOSIS — E11319 Type 2 diabetes mellitus with unspecified diabetic retinopathy without macular edema: Secondary | ICD-10-CM

## 2019-09-08 DIAGNOSIS — I10 Essential (primary) hypertension: Secondary | ICD-10-CM

## 2019-09-08 MED ORDER — HYDROCHLOROTHIAZIDE 12.5 MG PO TABS: 12.5000 mg | ORAL_TABLET | Freq: Every day | ORAL | 0 refills | Status: DC

## 2019-09-08 MED ORDER — BYDUREON 2 MG ~~LOC~~ PEN: PEN_INJECTOR | SUBCUTANEOUS | 0 refills | Status: DC

## 2019-09-09 ENCOUNTER — Other Ambulatory Visit: Payer: Self-pay | Admitting: Family Medicine

## 2019-09-09 DIAGNOSIS — F339 Major depressive disorder, recurrent, unspecified: Secondary | ICD-10-CM

## 2019-09-20 ENCOUNTER — Other Ambulatory Visit: Payer: Self-pay | Admitting: Family Medicine

## 2019-09-20 DIAGNOSIS — J3089 Other allergic rhinitis: Secondary | ICD-10-CM

## 2019-09-21 ENCOUNTER — Other Ambulatory Visit: Payer: Self-pay | Admitting: *Deleted

## 2019-09-21 DIAGNOSIS — F411 Generalized anxiety disorder: Secondary | ICD-10-CM

## 2019-09-21 MED ORDER — SERTRALINE HCL 50 MG PO TABS: 150.0000 mg | ORAL_TABLET | Freq: Every day | ORAL | 0 refills | Status: DC

## 2019-09-27 ENCOUNTER — Ambulatory Visit (HOSPITAL_COMMUNITY)
Admission: RE | Admit: 2019-09-27 | Discharge: 2019-09-27 | Disposition: A | Discharge status: Home / Self Care | Payer: Medicaid Other | Source: Ambulatory Visit | Attending: Nurse Practitioner | Admitting: Nurse Practitioner

## 2019-09-27 ENCOUNTER — Other Ambulatory Visit: Payer: Self-pay

## 2019-09-27 ENCOUNTER — Encounter (HOSPITAL_COMMUNITY): Payer: Self-pay

## 2019-09-27 DIAGNOSIS — M545 Low back pain, unspecified: Secondary | ICD-10-CM

## 2019-09-27 DIAGNOSIS — R945 Abnormal results of liver function studies: Secondary | ICD-10-CM

## 2019-09-27 NOTE — Telephone Encounter (Signed)
Patient requesting refill - last seen 08/11/19. Please advise and send back to pools.

## 2019-10-01 ENCOUNTER — Ambulatory Visit (HOSPITAL_COMMUNITY): Payer: Medicaid Other

## 2019-10-03 DIAGNOSIS — M9905 Segmental and somatic dysfunction of pelvic region: Secondary | ICD-10-CM | POA: Diagnosis not present

## 2019-10-03 DIAGNOSIS — M9903 Segmental and somatic dysfunction of lumbar region: Secondary | ICD-10-CM | POA: Diagnosis not present

## 2019-10-03 DIAGNOSIS — M9901 Segmental and somatic dysfunction of cervical region: Secondary | ICD-10-CM | POA: Diagnosis not present

## 2019-10-03 DIAGNOSIS — M545 Low back pain: Secondary | ICD-10-CM | POA: Diagnosis not present

## 2019-10-03 DIAGNOSIS — M546 Pain in thoracic spine: Secondary | ICD-10-CM | POA: Diagnosis not present

## 2019-10-03 DIAGNOSIS — M9902 Segmental and somatic dysfunction of thoracic region: Secondary | ICD-10-CM | POA: Diagnosis not present

## 2019-10-10 ENCOUNTER — Encounter: Payer: Self-pay | Admitting: Internal Medicine

## 2019-10-16 ENCOUNTER — Other Ambulatory Visit: Payer: Self-pay | Admitting: Family Medicine

## 2019-10-16 DIAGNOSIS — F339 Major depressive disorder, recurrent, unspecified: Secondary | ICD-10-CM

## 2019-10-17 ENCOUNTER — Ambulatory Visit: Payer: Self-pay | Admitting: Nurse Practitioner

## 2019-10-17 ENCOUNTER — Ambulatory Visit: Payer: Medicaid Other | Admitting: Gastroenterology

## 2019-10-24 ENCOUNTER — Other Ambulatory Visit: Payer: Self-pay | Admitting: Nurse Practitioner

## 2019-10-24 ENCOUNTER — Ambulatory Visit (HOSPITAL_COMMUNITY)
Admission: RE | Admit: 2019-10-24 | Discharge: 2019-10-24 | Disposition: A | Discharge status: Home / Self Care | Payer: Medicaid Other | Source: Ambulatory Visit | Attending: Nurse Practitioner | Admitting: Nurse Practitioner

## 2019-10-24 ENCOUNTER — Other Ambulatory Visit: Payer: Self-pay

## 2019-10-24 DIAGNOSIS — R945 Abnormal results of liver function studies: Secondary | ICD-10-CM | POA: Diagnosis not present

## 2019-10-24 DIAGNOSIS — K760 Fatty (change of) liver, not elsewhere classified: Secondary | ICD-10-CM | POA: Diagnosis not present

## 2019-10-24 DIAGNOSIS — D3501 Benign neoplasm of right adrenal gland: Secondary | ICD-10-CM | POA: Diagnosis not present

## 2019-10-24 LAB — CREATININE, SERUM
Creatinine, Ser: 0.77 mg/dL (ref 0.44–1.00)
GFR calc Af Amer: >60 mL/min (ref >60)
GFR calc non Af Amer: >60 mL/min (ref >60)

## 2019-10-24 MED ORDER — GADOBUTROL 1 MMOL/ML IV SOLN
10.0000 mL | Freq: Once | INTRAVENOUS | Status: AC | PRN
  Administered 2019-10-24: 10 mL via INTRAVENOUS

## 2019-10-27 ENCOUNTER — Other Ambulatory Visit: Payer: Self-pay

## 2019-10-28 ENCOUNTER — Telehealth: Payer: Self-pay | Admitting: Family Medicine

## 2019-10-28 ENCOUNTER — Encounter: Payer: Self-pay | Admitting: Family Medicine

## 2019-10-28 ENCOUNTER — Ambulatory Visit (INDEPENDENT_AMBULATORY_CARE_PROVIDER_SITE_OTHER): Payer: Medicaid Other | Admitting: Family Medicine

## 2019-10-28 VITALS — BP 134/88 | HR 92 | Temp 98.0°F | Resp 20 | Ht 62.0 in | Wt 338.0 lb

## 2019-10-28 DIAGNOSIS — E1159 Type 2 diabetes mellitus with other circulatory complications: Secondary | ICD-10-CM | POA: Diagnosis not present

## 2019-10-28 DIAGNOSIS — F339 Major depressive disorder, recurrent, unspecified: Secondary | ICD-10-CM

## 2019-10-28 DIAGNOSIS — E1169 Type 2 diabetes mellitus with other specified complication: Secondary | ICD-10-CM | POA: Diagnosis not present

## 2019-10-28 DIAGNOSIS — E781 Pure hyperglyceridemia: Secondary | ICD-10-CM

## 2019-10-28 DIAGNOSIS — Z6841 Body Mass Index (BMI) 40.0 and over, adult: Secondary | ICD-10-CM

## 2019-10-28 DIAGNOSIS — G44209 Tension-type headache, unspecified, not intractable: Secondary | ICD-10-CM | POA: Insufficient documentation

## 2019-10-28 DIAGNOSIS — R079 Chest pain, unspecified: Secondary | ICD-10-CM | POA: Diagnosis not present

## 2019-10-28 DIAGNOSIS — F411 Generalized anxiety disorder: Secondary | ICD-10-CM

## 2019-10-28 DIAGNOSIS — K219 Gastro-esophageal reflux disease without esophagitis: Secondary | ICD-10-CM | POA: Diagnosis not present

## 2019-10-28 DIAGNOSIS — I10 Essential (primary) hypertension: Secondary | ICD-10-CM

## 2019-10-28 LAB — BAYER DCA HB A1C WAIVED: HB A1C (BAYER DCA - WAIVED): 7 % — ABNORMAL HIGH (ref <7.0)

## 2019-10-28 MED ORDER — SERTRALINE HCL 100 MG PO TABS: 200.0000 mg | ORAL_TABLET | Freq: Every day | ORAL | 6 refills | Status: DC

## 2019-10-28 NOTE — Telephone Encounter (Signed)
Patient aware per labs

## 2019-10-28 NOTE — Progress Notes (Signed)
Subjective:  Patient ID: Heidi Landry, female    DOB: Dec 11, 1986, 34 y.o.   MRN: [id number]  Patient Care Team: Baruch Gouty, FNP as PCP - General (Family Medicine) Harl Bowie, Alphonse Guild, MD as PCP - Cardiology (Cardiology) Gala Romney Cristopher Estimable, MD as Consulting Physician (Gastroenterology)   Chief Complaint:  Annual Exam (No pap   Back and hip pain)   HPI: Heidi Landry is a 33 y.o. female presenting on 10/28/2019 for Annual Exam (No pap   Back and hip pain)   HPI   1. Type 2 Diabetes Doesn't check blood sugars at home. Is compliant with medications without adverse effects. Has not been exercising over the last few months due to a previous leg injury that is healing. Has been adding more vegetables to diet and eating smaller portions.   2. Hypertension Does not check blood pressure at home but has cuff to do so. Feeling stressed this morning. Trying to make dietary changes. No current exercise.   3. Depression/Anxiety Has been under increased stress for the last few months. She has been helping a friend whose husband recently died. She also reports that her son can be difficult at times and causes her stress. She feels like she "does for everybody else" and then doesn't have time for herself. She is taking Zoloft and Wellbutrin daily. She denies SI or HI. She is not interested in counseling or therapy at this time due to a busy life.   4. Chest pain Reports pressure sensation in center of chest 1-2 times a week for the last 2 months. Reports that it only occurs when she is stressed or anxious. Denies diaphoresis, SOB, dizziness, nausea, or one sided weakness. Denies exertional fatigue.  5. Tension headache Has been having headaches 3-4x a week that usually are frontal or occipital and bilateral. Denies nausea, vomiting, photophobia, phonophobia, or visual disturbances with headaches. Has tried tylenol and ibuprofen without relief and doesn't like to take pain medication. No focal  deficits.   6. GERD Has occasional random episodes of nausea that last for only a few minutes. Denies vomiting, abdominal pain, change in bowel habits, or blood in stool.    Relevant past medical, surgical, family, and social history reviewed and updated as indicated.  Allergies and medications reviewed and updated. Date reviewed: Chart in Epic.   Past Medical History:  Diagnosis Date   Anxiety    Atrial fibrillation ([place])    Carpal tunnel syndrome, bilateral    Chest pain    Chronic back pain    Degenerative disc disease    Edema    Gestational diabetes mellitus, antepartum    Gestational diabetes mellitus, class A2/B 08/08/2015   Early 2hr: 84/186/117 @ 12wks   A1/B  Notified & dietician referral ordered 08/08/15    Hypertension    IBS (irritable bowel syndrome)    Panic attacks    Placental abruption in third trimester 12/07/2015   Post partum depression    Sleep apnea    Spondylosis    Tendonitis    Thoracic spine fracture ([place]) 04/13/2012   Varicose veins of left lower extremity with complications 56/81/2751    Past Surgical History:  Procedure Laterality Date   CESAREAN SECTION N/A 12/08/2015   Procedure: CESAREAN SECTION;  Surgeon: Jonnie Kind, MD;  Location: [facility];  Service: Obstetrics;  Laterality: N/A;   CHOLECYSTECTOMY     KNEE SURGERY Right 01/29/2019   at [facility] in [place],  [place]; s/p fall and deel leg laceration    Social History   Socioeconomic History   Marital status: Married    Spouse name: Not on file   Number of children: Not on file   Years of education: 12   Highest education level: Not on file  Occupational History    Employer: [employer]  Social Needs   Financial resource strain: Not on file   Food insecurity    Worry: Not on file    Inability: Not on file   Transportation needs    Medical: Not on file    Non-medical: Not on file  Tobacco Use   Smoking status: Current  Every Day Smoker    Packs/day: 0.50    Years: 9.00    Pack years: 4.50    Types: Cigarettes    Start date: 05/25/2006   Smokeless tobacco: Never Used  Substance and Sexual Activity   Alcohol use: No    Alcohol/week: 0.0 standard drinks   Drug use: No   Sexual activity: Yes    Birth control/protection: Pill  Lifestyle   Physical activity    Days per week: Not on file    Minutes per session: Not on file   Stress: Not on file  Relationships   Social connections    Talks on phone: Not on file    Gets together: Not on file    Attends religious service: Not on file    Active member of club or organization: Not on file    Attends meetings of clubs or organizations: Not on file    Relationship status: Not on file   Intimate partner violence    Fear of current or ex partner: Not on file    Emotionally abused: Not on file    Physically abused: Not on file    Forced sexual activity: Not on file  Other Topics Concern   Not on file  Social History Narrative   Not on file    Outpatient Encounter Medications as of 10/28/2019  Medication Sig   albuterol (PROVENTIL HFA;VENTOLIN HFA) 108 (90 Base) MCG/ACT inhaler Inhale 2 puffs into the lungs every 6 (six) hours as needed for wheezing or shortness of breath.   atorvastatin (LIPITOR) 20 MG tablet Take 1 tablet (20 mg total) by mouth daily.   bevacizumab (AVASTIN) 400 MG/16ML SOLN Inject into the vein once. Every 8 weeks injected into right eye.   buPROPion (WELLBUTRIN SR) 150 MG 12 hr tablet Take 1 tablet (150 mg total) by mouth 2 (two) times daily.   Butoconazole Nitrate, 1 Dose, 2 % CREA Place 5 g vaginally at bedtime.   cyclobenzaprine (FLEXERIL) 10 MG tablet Take 1 tablet by mouth three times daily as needed for muscle spasm   dicyclomine (BENTYL) 20 MG tablet Take 1 tablet (20 mg total) by mouth 4 (four) times daily as needed for spasms.   EQ ALLERGY RELIEF, CETIRIZINE, 10 MG tablet Take 1 tablet by mouth once daily     Exenatide ER (BYDUREON) 2 MG PEN INJECT 2MG INTO THE SKIN ONCE A WEEK.   fluconazole (DIFLUCAN) 150 MG tablet Take 1 tablet (150 mg total) by mouth every three (3) days as needed.   fluticasone (FLONASE) 50 MCG/ACT nasal spray Place 2 sprays into both nostrils daily.   hydrochlorothiazide (HYDRODIURIL) 12.5 MG tablet Take 1 tablet (12.5 mg total) by mouth daily.   metFORMIN (GLUCOPHAGE-XR) 500 MG 24 hr tablet TAKE 2 TABLETS BY MOUTH TWICE DAILY  metoprolol succinate (TOPROL-XL) 25 MG 24 hr tablet TAKE 1 TABLET BY MOUTH ONCE DAILY WITH  OR  IMMEDIATELY  FOLLOWING  A  MEAL   norethindrone (ORTHO MICRONOR) 0.35 MG tablet Take 1 tablet (0.35 mg total) by mouth daily.   omeprazole (PRILOSEC) 40 MG capsule Take 1 capsule (40 mg total) by mouth daily.   podofilox (CONDYLOX) 0.5 % gel Apply topically 2 (two) times daily.   senna-docusate (SENOKOT-S) 8.6-50 MG tablet Take 1 tablet by mouth 2 (two) times daily at 10 AM and 5 PM. Takes every other day   spironolactone (ALDACTONE) 100 MG tablet Take 1 tablet (100 mg total) by mouth daily.   [DISCONTINUED] sertraline (ZOLOFT) 50 MG tablet Take 3 tablets (150 mg total) by mouth daily.   sertraline (ZOLOFT) 100 MG tablet Take 2 tablets (200 mg total) by mouth daily.   No facility-administered encounter medications on file as of 10/28/2019.     No Known Allergies  Review of Systems  Constitutional: Negative for activity change, appetite change, chills, diaphoresis, fatigue, fever and unexpected weight change.  HENT: Negative.  Negative for sore throat, trouble swallowing and voice change.   Eyes: Negative.  Negative for photophobia and visual disturbance.  Respiratory: Negative for cough, choking, chest tightness, shortness of breath and wheezing.   Cardiovascular: Negative for chest pain and leg swelling.  Gastrointestinal: Positive for nausea (occasional). Negative for abdominal pain, anal bleeding, blood in stool, constipation, diarrhea  and vomiting.  Endocrine: Negative.  Negative for polydipsia, polyphagia and polyuria.  Genitourinary: Negative for decreased urine volume, difficulty urinating, dysuria, frequency, hematuria and urgency.  Musculoskeletal: Negative for arthralgias and myalgias.  Skin: Negative.  Negative for rash and wound.  Allergic/Immunologic: Negative.   Neurological: Positive for headaches. Negative for dizziness, tremors, seizures, syncope, facial asymmetry, speech difficulty, weakness, light-headedness and numbness.  Hematological: Negative.  Does not bruise/bleed easily.  Psychiatric/Behavioral: Negative for confusion, hallucinations, sleep disturbance and suicidal ideas.  All other systems reviewed and are negative.       Objective:  BP 134/88    Pulse 92    Temp 98 F (36.7 C) (Temporal)    Resp 20    Ht 5' 2"  (1.575 m)    Wt (!) 338 lb (153.3 kg)    SpO2 94%    BMI 61.82 kg/m    Wt Readings from Last 3 Encounters:  10/28/19 (!) 338 lb (153.3 kg)  08/11/19 (!) 335 lb (152 kg)  06/20/19 (!) 330 lb (149.7 kg)    Physical Exam Vitals signs and nursing note reviewed.  Constitutional:      General: She is not in acute distress.    Appearance: Normal appearance. She is obese. She is not ill-appearing, toxic-appearing or diaphoretic.  HENT:     Head: Normocephalic and atraumatic.     Right Ear: Tympanic membrane, ear canal and external ear normal.     Left Ear: Tympanic membrane, ear canal and external ear normal.     Nose: Nose normal.     Mouth/Throat:     Mouth: Mucous membranes are moist.     Pharynx: Oropharynx is clear.  Eyes:     Extraocular Movements: Extraocular movements intact.     Conjunctiva/sclera: Conjunctivae normal.     Pupils: Pupils are equal, round, and reactive to light.  Neck:     Musculoskeletal: Normal range of motion and neck supple. No neck rigidity.     Thyroid: No thyroid mass, thyromegaly or thyroid tenderness.  Vascular: No carotid bruit or JVD.      Trachea: Trachea and phonation normal.  Cardiovascular:     Rate and Rhythm: Normal rate and regular rhythm.     Pulses: Normal pulses.     Heart sounds: Normal heart sounds. No murmur. No friction rub. No gallop.   Pulmonary:     Effort: Pulmonary effort is normal. No respiratory distress.     Breath sounds: Normal breath sounds. No wheezing.  Abdominal:     General: Bowel sounds are normal. There is no distension.     Palpations: Abdomen is soft.     Tenderness: There is no abdominal tenderness. There is no right CVA tenderness or left CVA tenderness.  Musculoskeletal: Normal range of motion.     Right lower leg: No edema.     Left lower leg: No edema.     Comments: Large scar that wraps around circumference of right lower exteremity  Lymphadenopathy:     Cervical: No cervical adenopathy.  Skin:    General: Skin is warm and dry.     Capillary Refill: Capillary refill takes less than 2 seconds.     Findings: No rash.  Neurological:     General: No focal deficit present.     Mental Status: She is alert and oriented to person, place, and time.     Cranial Nerves: No cranial nerve deficit or facial asymmetry.     Motor: No weakness.     Coordination: Coordination normal.     Gait: Gait normal.  Psychiatric:        Mood and Affect: Mood normal.        Behavior: Behavior normal. Behavior is cooperative.        Thought Content: Thought content normal.        Judgment: Judgment normal.     Results for orders placed or performed during the hospital encounter of 10/24/19  Creatinine, serum  Result Value Ref Range   Creatinine, Ser 0.77 0.44 - 1.00 mg/dL   GFR calc non Af Amer >60 >60 mL/min   GFR calc Af Amer >60 >60 mL/min       Pertinent labs & imaging results that were available during my care of the patient were reviewed by me and considered in my medical decision making.  Assessment & Plan:  Heidi Landry was seen today for annual exam.  Diagnoses and all orders for this  visit:  Generalized anxiety disorder Depression, recurrent ([place]) Declines counseling or therapy referral at this time. Increase Zoloft dosage to 200 mg/day. Notify provider of new or worsening symptoms.   If your symptoms worsen or you have thoughts of suicide/homicide, PLEASE SEEK IMMEDIATE MEDICAL ATTENTION.  You may always call the National Suicide Hotline.  This is available 24 hours a day, 7 days a week.  Their number is: 210-785-2856  Taking the medicine as directed and not missing any doses is one of the best things you can do to treat your depression.  Here are some things to keep in mind:  1) Side effects (stomach upset, some increased anxiety) may happen before you notice a benefit.  These side effects typically go away over time. 2) Changes to your dose of medicine or a change in medication all together is sometimes necessary 3) Most people need to be on medication at least 12 months 4) Many people will notice an improvement within two weeks but the full effect of the medication can take up to 4-6 weeks 5) Stopping the  medication when you start feeling better often results in a return of symptoms 6) Never discontinue your medication without contacting a health care professional first.  Some medications require gradual discontinuation/ taper and can make you sick if you stop them abruptly.  If your symptoms worsen or you have thoughts of suicide/homicide, PLEASE SEEK IMMEDIATE MEDICAL ATTENTION.  You may always call:  National Suicide Hotline: 217 211 5127 Heidi Landry: (430)048-4749 [facility] in [place]: 4161000511  These are available 24 hours a day, 7 days a week. -     Thyroid Panel With TSH -     sertraline (ZOLOFT) 100 MG tablet; Take 2 tablets (200 mg total) by mouth daily.  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  Rx changes: none today 2.  Education: Reviewed ABCs of diabetes management (respective goals in parentheses):  A1C (<7), blood pressure (<130/80), BMI (<25), and cholesterol (LDL <100). 3.  Discussed pathophysiology of DM; difference between type 1 and type 2 DM. 4.  CHO counting diet discussed.  Reviewed CHO amount in various foods and how to read nutrition labels.  Discussed recommended serving sizes.  5.  Recommend check BG several times a week and record readings to bring to next appointment. Report persistent high or low readings. 6.  Recommended increase physical activity - goal is 150 minutes per week and advance as tolerated. 7.  Adequate sleep, at least 6-8 hours per night.  8.  Smoking cessation.  9.  Follow up: 3 months    -     CMP14+EGFR -     Bayer DCA Hb A1c Waived  Hypertension associated with type 2 diabetes mellitus (HCC) BP fairly controlled. Discussed checking BP at home at least a few times at week given slightly elevated BP today. Pt aware to report any persistent high or low readingsChanges were not made in regimen today. Goal BP is 130/80. DASH diet and exercise encouraged. Exercise at least 150 minutes per week and increase as tolerated. Goal BMI < 25. Stress management encouraged. Avoid nicotine and tobacco product use. Avoid excessive alcohol and NSAID's. Avoid more than 2000 mg of sodium daily. Medications as prescribed. Follow up as scheduled.  -     CMP14+EGFR -     CBC with Differential  Chest pain, unspecified type Given patient's description and no red flags on exam, likely due to anxiety. Report any new or worsening symptoms.   Morbid obesity with BMI of 50.0-59.9, adult (HCC) -     CMP14+EGFR -     CBC with  Differential -     Lipid Panel -     Thyroid Panel With TSH -     Bayer DCA Hb A1c Waived  GERD without esophagitis Continue medications as prescribed. Report any new or worsening symptoms.  -     CBC with Differential  Tension headache  Given patient's history, like tension headache. No red flags on exam. Headache diary given to patient. Can continue OTC medications as needed. Notify provider for new or worsening symptoms.   Continue all other maintenance medications.  Total time spent with patient 40 minutes.  Greater than 50%  of encounter spent in coordination of care/counseling.  Follow up plan: Return in about 3 months (around 01/28/2020), or if symptoms worsen or fail to improve, for DM.  Continue healthy lifestyle choices, including diet (rich in fruits, vegetables, and lean proteins, and low in salt and simple carbohydrates) and exercise (at least 30 minutes of moderate physical activity daily).  Educational handout given for tension headache, stress, dash diet.   The above assessment and management plan was discussed with the patient. The patient verbalized understanding of and has agreed to the management plan. Patient is aware to call the clinic if they develop any new symptoms or if symptoms persist or worsen. Patient is aware when to return to the clinic for a follow-up visit. Patient educated on when it is appropriate to go to the emergency department.   Marjorie Smolder, RN, FNP student [facility] [phone]   I personally was present during the history, physical exam, and medical decision-making activities of this service and have verified that the service and findings are accurately documented in the nurse practitioner student's note.  Monia Pouch, FNP-C [facility] [address] [place], [place] [address] [phone]

## 2019-10-28 NOTE — Patient Instructions (Addendum)
Stress Stress is a normal reaction to life events. Stress is what you feel when life demands more than you are used to, or more than you think you can handle. Some stress can be useful, such as studying for a test or meeting a deadline at work. Stress that occurs too often or for too long can cause problems. It can affect your emotional health and interfere with relationships and normal daily activities. Too much stress can weaken your body's defense system (immune system) and increase your risk for physical illness. If you already have a medical problem, stress can make it worse. What are the causes? All sorts of life events can cause stress. An event that causes stress for one person may not be stressful for another person. Major life events, whether positive or negative, commonly cause stress. Examples include:  Losing a job or starting a new job.  Losing a loved one.  Moving to a new town or home.  Getting married or divorced.  Having a baby.  Injury or illness. Less obvious life events can also cause stress, especially if they occur day after day or in combination with each other. Examples include:  Working long hours.  Driving in traffic.  Caring for children.  Being in debt.  Being in a difficult relationship. What are the signs or symptoms? Stress can cause emotional symptoms, including:  Anxiety. This is feeling worried, afraid, on edge, overwhelmed, or out of control.  Anger, including irritation or impatience.  Depression. This is feeling sad, down, helpless, or guilty.  Trouble focusing, remembering, or making decisions. Stress can cause physical symptoms, including:  Aches and pains. These may affect your head, neck, back, stomach, or other areas of your body.  Tight muscles or a clenched jaw.  Low energy.  Trouble sleeping. Stress can cause unhealthy behaviors, including:  Eating to feel better (overeating) or skipping meals.  Working too much or  putting off tasks.  Smoking, drinking alcohol, or using drugs to feel better. How is this diagnosed? Stress is diagnosed through an assessment by your health care provider. He or she may diagnose this condition based on:  Your symptoms and any stressful life events.  Your medical history.  Tests to rule out other causes of your symptoms. Depending on your condition, your health care provider may refer you to a specialist for further evaluation. How is this treated?  Stress management techniques are the recommended treatment for stress. Medicine is not typically recommended for the treatment of stress. Techniques to reduce your reaction to stressful life events include:  Stress identification. Monitor yourself for symptoms of stress and identify what causes stress for you. These skills may help you to avoid or prepare for stressful events.  Time management. Set your priorities, keep a calendar of events, and learn to say "no." Taking these actions can help you avoid making too many commitments. Techniques for coping with stress include:  Rethinking the problem. Try to think realistically about stressful events rather than ignoring them or overreacting. Try to find the positives in a stressful situation rather than focusing on the negatives.  Exercise. Physical exercise can release both physical and emotional tension. The key is to find a form of exercise that you enjoy and do it regularly.  Relaxation techniques. These relax the body and mind. The key is to find one or more that you enjoy and use the technique(s) regularly. Examples include: ? Meditation, deep breathing, or progressive relaxation techniques. ? Yoga or tai chi. ?  Biofeedback, mindfulness techniques, or journaling. ? Listening to music, being out in nature, or participating in other hobbies.  Practicing a healthy lifestyle. Eat a balanced diet, drink plenty of water, limit or avoid caffeine, and get plenty of sleep.   Having a strong support network. Spend time with family, friends, or other people you enjoy being around. Express your feelings and talk things over with someone you trust. Counseling or talk therapy with a mental health professional may be helpful if you are having trouble managing stress on your own. Follow these instructions at home: Lifestyle   Avoid drugs.  Do not use any products that contain nicotine or tobacco, such as cigarettes and e-cigarettes. If you need help quitting, ask your health care provider.  Limit alcohol intake to no more than 1 drink a day for nonpregnant women and 2 drinks a day for men. One drink equals 12 oz of beer, 5 oz of wine, or 1 oz of hard liquor.  Do not use alcohol or drugs to relax.  Eat a balanced diet that includes fresh fruits and vegetables, whole grains, lean meats, fish, eggs, and beans, and low-fat dairy. Avoid processed foods and foods high in added fat, sugar, and salt.  Exercise at least 30 minutes on 5 or more days each week.  Get 7-8 hours of sleep each night. General instructions   Practice stress management techniques as discussed with your health care provider.  Drink enough fluid to keep your urine clear or pale yellow.  Take over-the-counter and prescription medicines only as told by your health care provider.  Keep all follow-up visits as told by your health care provider. This is important. Contact a health care provider if:  Your symptoms get worse.  You have new symptoms.  You feel overwhelmed by your problems and can no longer manage them on your own. Get help right away if:  You have thoughts of hurting yourself or others. If you ever feel like you may hurt yourself or others, or have thoughts about taking your own life, get help right away. You can go to your nearest emergency department or call:  Your local emergency services (911 in the [place]  A suicide crisis helpline, such as the [place] at 9020654211. This is open 24 hours a day. Summary  Stress is a normal reaction to life events. It can cause problems if it happens too often or for too long.  Practicing stress management techniques is the best way to treat stress.  Counseling or talk therapy with a mental health professional may be helpful if you are having trouble managing stress on your own. This information is not intended to replace advice given to you by your health care provider. Make sure you discuss any questions you have with your health care provider. Document Released: 06/10/2001 Document Revised: 11/27/2017 Document Reviewed: 02/04/2017 Elsevier Patient Education  [place]. Tension Headache, Adult A tension headache is a feeling of pain, pressure, or aching in the head that is often felt over the front and sides of the head. The pain can be dull, or it can feel tight (constricting). There are two types of tension headache:  Episodic tension headache. This is when the headaches happen fewer than 15 days a month.  Chronic tension headache. This is when the headaches happen more than 15 days a month during a 39-monthperiod. A tension headache can last from 30 minutes to several days. It is the most common kind  of headache. Tension headaches are not normally associated with nausea or vomiting, and they do not get worse with physical activity. What are the causes? The exact cause of this condition is not known. Tension headaches are often triggered by stress, anxiety, or depression. Other triggers include:  Alcohol.  Too much caffeine or caffeine withdrawal.  Respiratory infections, such as colds, flu, or sinus infections.  Dental problems or teeth clenching.  Tiredness (fatigue).  Holding your head and neck in the same position for a long period of time, such as while using a computer.  Smoking.  Arthritis of the neck. What are the signs or symptoms? Symptoms of this condition  include:  A feeling of pressure or tightness around the head.  Dull, aching head pain.  Pain over the front and sides of the head.  Tenderness in the muscles of the head, neck, and shoulders. How is this diagnosed? This condition may be diagnosed based on your symptoms, your medical history, and a physical exam. If your symptoms are severe or unusual, you may have imaging tests, such as a CT scan or an MRI of your head. Your vision may also be checked. How is this treated? This condition may be treated with lifestyle changes and with medicines that help relieve symptoms. Follow these instructions at home: Managing pain  Take over-the-counter and prescription medicines only as told by your health care provider.  When you have a headache, lie down in a dark, quiet room.  If directed, apply ice to the head and neck: ? Put ice in a plastic bag. ? Place a towel between your skin and the bag. ? Leave the ice on for 20 minutes, 2-3 times a day.  If directed, apply heat to the back of your neck as often as told by your health care provider. Use the heat source that your health care provider recommends, such as a moist heat pack or a heating pad. ? Place a towel between your skin and the heat source. ? Leave the heat on for 20-30 minutes. ? Remove the heat if your skin turns bright red. This is especially important if you are unable to feel pain, heat, or cold. You may have a greater risk of getting burned. Eating and drinking  Eat meals on a regular schedule.  Limit alcohol intake to no more than 1 drink a day for nonpregnant women and 2 drinks a day for men. One drink equals 12 oz of beer, 5 oz of wine, or 1 oz of hard liquor.  Drink enough fluid to keep your urine pale yellow.  Decrease your caffeine intake, or stop using caffeine. Lifestyle  Get 7-9 hours of sleep each night, or get the amount of sleep recommended by your health care provider.  At bedtime, remove all electronic  devices from your room. Electronic devices include computers, phones, and tablets.  Find ways to manage your stress. Some things that can help relieve stress include: ? Exercise. ? Deep breathing exercises. ? Yoga. ? Listening to music. ? Positive mental imagery.  Try to sit up straight and avoid tensing your muscles.  Do not use any products that contain nicotine or tobacco, such as cigarettes and e-cigarettes. If you need help quitting, ask your health care provider. General instructions   Keep all follow-up visits as told by your health care provider. This is important.  Avoid any headache triggers. Keep a headache journal to help find out what may trigger your headaches. For example, write down: ?  What you eat and drink. ? How much sleep you get. ? Any change to your diet or medicines. Contact a health care provider if:  Your headache does not get better.  Your headache comes back.  You are sensitive to sounds, light, or smells because of a headache.  You have nausea or you vomit.  Your stomach hurts. Get help right away if:  You suddenly develop a very severe headache along with any of the following: ? A stiff neck. ? Nausea and vomiting. ? Confusion. ? Weakness. ? Double vision or loss of vision. ? Shortness of breath. ? Rash. ? Unusual sleepiness. ? Fever. ? Trouble speaking. ? Pain in your eyes or ears. ? Trouble walking or balancing. ? Feeling faint or passing out. Summary  A tension headache is a feeling of pain, pressure, or aching in the head that is often felt over the front and sides of the head.  A tension headache can last from 30 minutes to several days. It is the most common kind of headache.  This condition may be diagnosed based on your symptoms, your medical history, and a physical exam.  This condition may be treated with lifestyle changes and with medicines that help relieve symptoms. This information is not intended to replace advice  given to you by your health care provider. Make sure you discuss any questions you have with your health care provider. Document Released: 12/15/2005 Document Revised: 11/27/2017 Document Reviewed: 03/27/2017 Elsevier Patient Education  2020 [place] DASH stands for "Dietary Approaches to Stop Hypertension." The DASH eating plan is a healthy eating plan that has been shown to reduce high blood pressure (hypertension). It may also reduce your risk for type 2 diabetes, heart disease, and stroke. The DASH eating plan may also help with weight loss. What are tips for following this plan?  General guidelines  Avoid eating more than 2,300 mg (milligrams) of salt (sodium) a day. If you have hypertension, you may need to reduce your sodium intake to 1,500 mg a day.  Limit alcohol intake to no more than 1 drink a day for nonpregnant women and 2 drinks a day for men. One drink equals 12 oz of beer, 5 oz of wine, or 1 oz of hard liquor.  Work with your health care provider to maintain a healthy body weight or to lose weight. Ask what an ideal weight is for you.  Get at least 30 minutes of exercise that causes your heart to beat faster (aerobic exercise) most days of the week. Activities may include walking, swimming, or biking.  Work with your health care provider or diet and nutrition specialist (dietitian) to adjust your eating plan to your individual calorie needs. Reading food labels   Check food labels for the amount of sodium per serving. Choose foods with less than 5 percent of the Daily Value of sodium. Generally, foods with less than 300 mg of sodium per serving fit into this eating plan.  To find whole grains, look for the word "whole" as the first word in the ingredient list. Shopping  Buy products labeled as "low-sodium" or "no salt added."  Buy fresh foods. Avoid canned foods and premade or frozen meals. Cooking  Avoid adding salt when cooking. Use salt-free  seasonings or herbs instead of table salt or sea salt. Check with your health care provider or pharmacist before using salt substitutes.  Do not fry foods. Cook foods using healthy methods such as baking, boiling, grilling,  and broiling instead.  Cook with heart-healthy oils, such as olive, canola, soybean, or sunflower oil. Meal planning  Eat a balanced diet that includes: ? 5 or more servings of fruits and vegetables each day. At each meal, try to fill half of your plate with fruits and vegetables. ? Up to 6-8 servings of whole grains each day. ? Less than 6 oz of lean meat, poultry, or fish each day. A 3-oz serving of meat is about the same size as a deck of cards. One egg equals 1 oz. ? 2 servings of low-fat dairy each day. ? A serving of nuts, seeds, or beans 5 times each week. ? Heart-healthy fats. Healthy fats called Omega-3 fatty acids are found in foods such as flaxseeds and coldwater fish, like sardines, salmon, and mackerel.  Limit how much you eat of the following: ? Canned or prepackaged foods. ? Food that is high in trans fat, such as fried foods. ? Food that is high in saturated fat, such as fatty meat. ? Sweets, desserts, sugary drinks, and other foods with added sugar. ? Full-fat dairy products.  Do not salt foods before eating.  Try to eat at least 2 vegetarian meals each week.  Eat more home-cooked food and less restaurant, buffet, and fast food.  When eating at a restaurant, ask that your food be prepared with less salt or no salt, if possible. What foods are recommended? The items listed may not be a complete list. Talk with your dietitian about what dietary choices are best for you. Grains Whole-grain or whole-wheat bread. Whole-grain or whole-wheat pasta. Brown rice. [place]. Bulgur. Whole-grain and low-sodium cereals. Pita bread. Low-fat, low-sodium crackers. Whole-wheat flour tortillas. Vegetables Fresh or frozen vegetables (raw, steamed, roasted, or  grilled). Low-sodium or reduced-sodium tomato and vegetable juice. Low-sodium or reduced-sodium tomato sauce and tomato paste. Low-sodium or reduced-sodium canned vegetables. Fruits All fresh, dried, or frozen fruit. Canned fruit in natural juice (without added sugar). Meat and other protein foods Skinless chicken or [place]. Ground chicken or [place]. Pork with fat trimmed off. Fish and seafood. Egg whites. Dried beans, peas, or lentils. Unsalted nuts, nut butters, and seeds. Unsalted canned beans. Lean cuts of beef with fat trimmed off. Low-sodium, lean deli meat. Dairy Low-fat (1%) or fat-free (skim) milk. Fat-free, low-fat, or reduced-fat cheeses. Nonfat, low-sodium ricotta or cottage cheese. Low-fat or nonfat yogurt. Low-fat, low-sodium cheese. Fats and oils Soft margarine without trans fats. Vegetable oil. Low-fat, reduced-fat, or light mayonnaise and salad dressings (reduced-sodium). Canola, safflower, olive, soybean, and sunflower oils. Avocado. Seasoning and other foods Herbs. Spices. Seasoning mixes without salt. Unsalted popcorn and pretzels. Fat-free sweets. What foods are not recommended? The items listed may not be a complete list. Talk with your dietitian about what dietary choices are best for you. Grains Baked goods made with fat, such as croissants, muffins, or some breads. Dry pasta or rice meal packs. Vegetables Creamed or fried vegetables. Vegetables in a cheese sauce. Regular canned vegetables (not low-sodium or reduced-sodium). Regular canned tomato sauce and paste (not low-sodium or reduced-sodium). Regular tomato and vegetable juice (not low-sodium or reduced-sodium). Angie Fava. Olives. Fruits Canned fruit in a light or heavy syrup. Fried fruit. Fruit in cream or butter sauce. Meat and other protein foods Fatty cuts of meat. Ribs. Fried meat. Berniece Salines. Sausage. Bologna and other processed lunch meats. Salami. Fatback. Hotdogs. Bratwurst. Salted nuts and seeds. Canned beans with  added salt. Canned or smoked fish. Whole eggs or egg yolks. Chicken or [place]  with skin. Dairy Whole or 2% milk, cream, and half-and-half. Whole or full-fat cream cheese. Whole-fat or sweetened yogurt. Full-fat cheese. Nondairy creamers. Whipped toppings. Processed cheese and cheese spreads. Fats and oils Butter. Stick margarine. Lard. Shortening. Ghee. Bacon fat. Tropical oils, such as coconut, palm kernel, or palm oil. Seasoning and other foods Salted popcorn and pretzels. Onion salt, garlic salt, seasoned salt, table salt, and sea salt. Worcestershire sauce. Tartar sauce. Barbecue sauce. Teriyaki sauce. Soy sauce, including reduced-sodium. Steak sauce. Canned and packaged gravies. Fish sauce. Oyster sauce. Cocktail sauce. Horseradish that you find on the shelf. Ketchup. Mustard. Meat flavorings and tenderizers. Bouillon cubes. Hot sauce and Tabasco sauce. Premade or packaged marinades. Premade or packaged taco seasonings. Relishes. Regular salad dressings. Where to find more information:  National Heart, Lung, and [place]: https://wilson-eaton.com/  American Heart Association: www.heart.org Summary  The DASH eating plan is a healthy eating plan that has been shown to reduce high blood pressure (hypertension). It may also reduce your risk for type 2 diabetes, heart disease, and stroke.  With the DASH eating plan, you should limit salt (sodium) intake to 2,300 mg a day. If you have hypertension, you may need to reduce your sodium intake to 1,500 mg a day.  When on the DASH eating plan, aim to eat more fresh fruits and vegetables, whole grains, lean proteins, low-fat dairy, and heart-healthy fats.  Work with your health care provider or diet and nutrition specialist (dietitian) to adjust your eating plan to your individual calorie needs. This information is not intended to replace advice given to you by your health care provider. Make sure you discuss any questions you have with your health care  provider. Document Released: 12/04/2011 Document Revised: 11/27/2017 Document Reviewed: 12/08/2016 Elsevier Patient Education  2020 [employer].

## 2019-10-29 LAB — THYROID PANEL WITH TSH
Free Thyroxine Index: 1.7 (ref 1.2–4.9)
T3 Uptake Ratio: 28 % (ref 24–39)
T4, Total: 5.9 ug/dL (ref 4.5–12.0)
TSH: 1.57 u[IU]/mL (ref 0.450–4.500)

## 2019-10-29 LAB — LIPID PANEL
Chol/HDL Ratio: 3.4 ratio (ref 0.0–4.4)
Cholesterol, Total: 131 mg/dL (ref 100–199)
HDL: 38 mg/dL — ABNORMAL LOW (ref >39)
LDL Chol Calc (NIH): 70 mg/dL (ref 0–99)
Triglycerides: 131 mg/dL (ref 0–149)
VLDL Cholesterol Cal: 23 mg/dL (ref 5–40)

## 2019-10-29 LAB — CMP14+EGFR
ALT: 39 IU/L — ABNORMAL HIGH (ref 0–32)
AST: 32 IU/L (ref 0–40)
Albumin/Globulin Ratio: 1.7 (ref 1.2–2.2)
Albumin: 4.3 g/dL (ref 3.8–4.8)
Alkaline Phosphatase: 134 IU/L — ABNORMAL HIGH (ref 39–117)
BUN/Creatinine Ratio: 14 (ref 9–23)
BUN: 10 mg/dL (ref 6–20)
Bilirubin Total: 0.5 mg/dL (ref 0.0–1.2)
CO2: 28 mmol/L (ref 20–29)
Calcium: 9.2 mg/dL (ref 8.7–10.2)
Chloride: 98 mmol/L (ref 96–106)
Creatinine, Ser: 0.72 mg/dL (ref 0.57–1.00)
GFR calc Af Amer: 126 mL/min/{1.73_m2} (ref >59)
GFR calc non Af Amer: 110 mL/min/{1.73_m2} (ref >59)
Globulin, Total: 2.6 g/dL (ref 1.5–4.5)
Glucose: 97 mg/dL (ref 65–99)
Potassium: 4.3 mmol/L (ref 3.5–5.2)
Sodium: 141 mmol/L (ref 134–144)
Total Protein: 6.9 g/dL (ref 6.0–8.5)

## 2019-10-29 LAB — CBC WITH DIFFERENTIAL/PLATELET
Basophils Absolute: 0.1 10*3/uL (ref 0.0–0.2)
Basos: 1 %
EOS (ABSOLUTE): 0.2 10*3/uL (ref 0.0–0.4)
Eos: 2 %
Hematocrit: 39.9 % (ref 34.0–46.6)
Hemoglobin: 12.8 g/dL (ref 11.1–15.9)
Immature Grans (Abs): 0.1 10*3/uL (ref 0.0–0.1)
Immature Granulocytes: 1 %
Lymphocytes Absolute: 3.5 10*3/uL — ABNORMAL HIGH (ref 0.7–3.1)
Lymphs: 29 %
MCH: 25.7 pg — ABNORMAL LOW (ref 26.6–33.0)
MCHC: 32.1 g/dL (ref 31.5–35.7)
MCV: 80 fL (ref 79–97)
Monocytes Absolute: 0.6 10*3/uL (ref 0.1–0.9)
Monocytes: 5 %
Neutrophils Absolute: 7.6 10*3/uL — ABNORMAL HIGH (ref 1.4–7.0)
Neutrophils: 62 %
Platelets: 224 10*3/uL (ref 150–450)
RBC: 4.99 x10E6/uL (ref 3.77–5.28)
RDW: 15 % (ref 11.7–15.4)
WBC: 12 10*3/uL — ABNORMAL HIGH (ref 3.4–10.8)

## 2019-10-31 ENCOUNTER — Telehealth: Payer: Self-pay | Admitting: Family Medicine

## 2019-10-31 ENCOUNTER — Ambulatory Visit: Payer: Medicaid Other | Admitting: Gastroenterology

## 2019-10-31 NOTE — Telephone Encounter (Signed)
Refer to labs

## 2019-11-02 ENCOUNTER — Telehealth: Payer: Self-pay | Admitting: Family Medicine

## 2019-11-02 NOTE — Progress Notes (Signed)
Referring Provider: Baruch Gouty, FNP Primary Care Physician:  Baruch Gouty, FNP Primary GI Physician: Dr. Gala Romney  Chief Complaint  Patient presents with  . Gastroesophageal Reflux  . abnormal lft's    HPI:   Heidi Landry is a 33 y.o. female with past medical history of elevated BMI, diabetes, HTN, and fatty liver disease.  Presenting today for follow-up of GERD, NAFLD, and and elevated LFTs.  Patient was last seen by our staff via virtual visit on 04/11/2019 for follow-up of the same.  She had labs completed in December 2019 for further evaluation of her elevated LFTs which found normal hemoglobin, improved/normal platelets at 249, improvement in LFTs with AST normal at 31 and ALT mildly elevated at 50, normal bilirubin and alk phos.  Ferritin normal at 124.  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  Normal ANA, normal smooth muscle antibodies.  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  CMP in March with normalized LFTs.  At last visit, patient reported she was doing well.  Had been going to the gym until stay-in-place order due to COVID-19.  Reflux was well controlled on omeprazole daily.  She did have a fairly recent significant fall through tile floor cutting her leg to the bone and requiring PRBC x4 in February 2020.  Plans to pursue MRI/MRCP due to positive AMA, recheck labs, and follow-up in 6 months.  Patient did not have the labs we placed completed.  She did have labs with her primary care on 10/28/2019 which revealed mildly elevated ALT at 39, normal AST at 32, slight elevation of alk phos at 134, normal total bili.  Hemoglobin normal at 12.8, platelets normal 224.  Electrolytes and kidney function within normal limits.  Lipid panel with improvement, remarkable only for HDL slightly low at 38.  MRCP on 10/24/2019: Hepatomegaly with severe hepatic steatosis, no signs of biliary tract obstruction or other findings to account for abnormal LFTs.  Spleen and  pancreas normal.  Small right adrenal adenoma.  Today she is very concerned about her enlarged liver and the adrenal adenoma. She has changed her diet and is really working on weight loss. Avoiding high card and high sugar foods. Drinking slimfast for breakfast. Lunch will be vegetables. Limited her meat intake. If hungry before dinner, will have spoon of peanut butter. Dinner will be a low carb meal. Decreased Pepsi intake. Started diet on 10/31. Not able to afford the gym. She is going to cancel her [employer] membership. States she is constantly doing something around the house though.  Patient had gained about 20 pounds between March and October of this year.  She has lost about 9 pounds in the last 6 days.  GERD is well controlled on omeprazole. Short lived pain under her left breast yesterday. Lasted about 2 minutes. Then on the right side. Lasted less than 2 minutes. No pain today. No bloating. No gas. No nausea or vomiting. No dysphagia. Bowels moving daily. No constipation or diarrhea. No blood in the stool. No melena. Intermittent lower abdominal pain, more in the pelvic region.. Present for a while now. Random shooting pain from vaginal area up. Associates this with her menstrual cycles becoming irregular fairly recently and recurrent BV. Also has history of ovarian cyst.   Father with history of colon cancer diagnosed in early 27s. Had partial colectomy. He now has something going on with his kidneys.   Denies yellowing of skin or eyes,  Confusion, swelling in abdomen or  lower extremities, or dark urine. No bruising or abnormal bleeding noted.   Only OTC supplement is a multivitamin.  Rare alcohol.   Past Medical History:  Diagnosis Date  . Adrenal adenoma    right  . Anxiety   . Atrial fibrillation ([place])   . Carpal tunnel syndrome, bilateral   . Chest pain   . Chronic back pain   . Degenerative disc disease   . Edema   . Gestational diabetes mellitus, antepartum   .  Gestational diabetes mellitus, class A2/B 08/08/2015   Early 2hr: 84/186/117 @ 12wks   A1/B  Notified & dietician referral ordered 08/08/15   . Hypertension   . IBS (irritable bowel syndrome)   . Panic attacks   . Placental abruption in third trimester 12/07/2015  . Post partum depression   . Sleep apnea   . Spondylosis   . Tendonitis   . Thoracic spine fracture ([place]) 04/13/2012  . Varicose veins of left lower extremity with complications 62/69/4854    Past Surgical History:  Procedure Laterality Date  . CESAREAN SECTION N/A 12/08/2015   Procedure: CESAREAN SECTION;  Surgeon: Jonnie Kind, MD;  Location: [facility];  Service: Obstetrics;  Laterality: N/A;  . CHOLECYSTECTOMY    . KNEE SURGERY Right 01/29/2019   at [facility] in [place], [place]; s/p fall and deel leg laceration    Current Outpatient Medications  Medication Sig Dispense Refill  . albuterol (PROVENTIL HFA;VENTOLIN HFA) 108 (90 Base) MCG/ACT inhaler Inhale 2 puffs into the lungs every 6 (six) hours as needed for wheezing or shortness of breath. 1 Inhaler 5  . atorvastatin (LIPITOR) 20 MG tablet Take 1 tablet (20 mg total) by mouth daily. 90 tablet 3  . bevacizumab (AVASTIN) 400 MG/16ML SOLN Inject into the vein once. Every 8 weeks injected into right eye.    Marland Kitchen buPROPion (WELLBUTRIN SR) 150 MG 12 hr tablet Take 1 tablet (150 mg total) by mouth 2 (two) times daily. 60 tablet 0  . Butoconazole Nitrate, 1 Dose, 2 % CREA Place 5 g vaginally at bedtime. (Patient taking differently: Place 5 g vaginally as needed. ) 10 g 2  . cyclobenzaprine (FLEXERIL) 10 MG tablet Take 1 tablet by mouth three times daily as needed for muscle spasm 90 tablet 0  . dicyclomine (BENTYL) 20 MG tablet Take 1 tablet (20 mg total) by mouth 4 (four) times daily as needed for spasms. 60 tablet 3  . EQ ALLERGY RELIEF, CETIRIZINE, 10 MG tablet Take 1 tablet by mouth once daily 90 tablet 0  . Exenatide ER (BYDUREON) 2 MG PEN INJECT 2MG INTO THE  SKIN ONCE A WEEK. 12 each 0  . fluconazole (DIFLUCAN) 150 MG tablet Take 1 tablet (150 mg total) by mouth every three (3) days as needed. 3 tablet 0  . fluticasone (FLONASE) 50 MCG/ACT nasal spray Place 2 sprays into both nostrils daily. 16 g 6  . hydrochlorothiazide (HYDRODIURIL) 12.5 MG tablet Take 1 tablet (12.5 mg total) by mouth daily. 90 tablet 0  . metFORMIN (GLUCOPHAGE-XR) 500 MG 24 hr tablet TAKE 2 TABLETS BY MOUTH TWICE DAILY (Patient taking differently: Take 500 mg by mouth daily. TAKE 2 TABLETS BY MOUTH TWICE DAILY) 360 tablet 0  . metoprolol succinate (TOPROL-XL) 25 MG 24 hr tablet TAKE 1 TABLET BY MOUTH ONCE DAILY WITH  OR  IMMEDIATELY  FOLLOWING  A  MEAL 90 tablet 0  . norethindrone (ORTHO MICRONOR) 0.35 MG tablet Take 1 tablet (0.35  mg total) by mouth daily. 1 Package 2  . omeprazole (PRILOSEC) 40 MG capsule Take 1 capsule (40 mg total) by mouth daily. 90 capsule 1  . podofilox (CONDYLOX) 0.5 % gel Apply topically 2 (two) times daily. 3.5 g 11  . rOPINIRole (REQUIP) 0.5 MG tablet Take 0.5 mg by mouth at bedtime.    . senna-docusate (SENOKOT-S) 8.6-50 MG tablet Take 1 tablet by mouth 2 (two) times daily at 10 AM and 5 PM. Takes every other day    . sertraline (ZOLOFT) 100 MG tablet Take 2 tablets (200 mg total) by mouth daily. 60 tablet 6  . spironolactone (ALDACTONE) 100 MG tablet Take 1 tablet (100 mg total) by mouth daily. 90 tablet 1   No current facility-administered medications for this visit.     Allergies as of 11/03/2019  . (No Known Allergies)    Family History  Problem Relation Age of Onset  . Fibromyalgia Mother   . Hernia Father   . Lung cancer Father   . Colon cancer Father 48       partial colectomy  . Liver disease Father        Fatty liver; ? ETOH-related  . Diabetes Maternal Grandmother   . Hypertension Maternal Grandmother   . Cancer Paternal Uncle   . Brain cancer Paternal Uncle   . Throat cancer Paternal Uncle   . Thyroid disease Paternal Aunt      Social History   Socioeconomic History  . Marital status: Married    Spouse name: Not on file  . Number of children: Not on file  . Years of education: 25  . Highest education level: Not on file  Occupational History    Employer: [employer]  Social Needs  . Financial resource strain: Not on file  . Food insecurity    Worry: Not on file    Inability: Not on file  . Transportation needs    Medical: Not on file    Non-medical: Not on file  Tobacco Use  . Smoking status: Current Every Day Smoker    Packs/day: 0.50    Years: 9.00    Pack years: 4.50    Types: Cigarettes    Start date: 05/25/2006  . Smokeless tobacco: Never Used  Substance and Sexual Activity  . Alcohol use: Yes    Alcohol/week: 0.0 standard drinks    Comment: rare  . Drug use: No  . Sexual activity: Yes    Birth control/protection: Pill  Lifestyle  . Physical activity    Days per week: Not on file    Minutes per session: Not on file  . Stress: Not on file  Relationships  . Social Herbalist on phone: Not on file    Gets together: Not on file    Attends religious service: Not on file    Active member of club or organization: Not on file    Attends meetings of clubs or organizations: Not on file    Relationship status: Not on file  Other Topics Concern  . Not on file  Social History Narrative  . Not on file    Review of Systems: Gen: Denies fever, chills, lightheadedness, dizziness, or feeling like she will pass out. HEENT: Denies nasal congestion or sore throat. CV: Occasional heart palpitations. Thinks it was due to caffeine. Intermittent cental chest pain.  PCP is aware and feels it is related to anxiety at this point.    Resp: Denies dyspnea at  rest, cough GI: See HPI Derm: Denies rash Psych: Admits to depression and anxiety.  Medications have recently been adjusted by PCP. Heme: Denies bruising, bleeding.   Physical Exam: BP (!) 150/79   Pulse 97   Temp (!) 97.1 F  (36.2 C) (Temporal)   Ht 5' 2"  (1.575 m)   Wt (!) 329 lb 6.4 oz (149.4 kg)   LMP 10/03/2019 (Approximate) Comment: irregular period  BMI 60.25 kg/m  General:   Alert and oriented. No distress noted. Pleasant and cooperative.  Head:  Normocephalic and atraumatic. Eyes:  Conjuctiva clear without scleral icterus. Heart:  S1, S2 present without murmurs appreciated. Lungs:  Clear to auscultation bilaterally. No wheezes, rales, or rhonchi. No distress.  Abdomen: Obese, +BS, soft,  non-distended.  Minimal tenderness to palpation diffusely.  Patient states her abdomen has always been somewhat tender.  No significant change. No rebound or guarding. No masses noted. Msk:  Symmetrical without gross deformities. Normal posture. Extremities:  Without edema. Neurologic:  Alert and  oriented x4 Psych: Normal mood and affect.

## 2019-11-02 NOTE — Telephone Encounter (Signed)
Please advise 

## 2019-11-02 NOTE — Telephone Encounter (Signed)
Patient notified of MRI results and verbalized understanding. States that she has a follow up with GI tomorrow to discuss

## 2019-11-02 NOTE — Telephone Encounter (Signed)
GI will follow up with imaging. I did review the results which revealed the following: 1. Hepatomegaly with severe hepatic steatosis. - enlarged live due to fatty liver disease 2. No signs of biliary tract obstruction or other findings to account for the abnormal liver function tests. 3. Small right adrenal adenoma. Less than 4 cm mass on right adrenal. Unilateral adrenal masses less than 4 cm are typically benign.

## 2019-11-03 ENCOUNTER — Encounter: Payer: Self-pay | Admitting: Gastroenterology

## 2019-11-03 ENCOUNTER — Ambulatory Visit (INDEPENDENT_AMBULATORY_CARE_PROVIDER_SITE_OTHER): Payer: Medicaid Other | Admitting: Gastroenterology

## 2019-11-03 ENCOUNTER — Other Ambulatory Visit: Payer: Self-pay

## 2019-11-03 VITALS — BP 150/79 | HR 97 | Temp 97.1°F | Ht 62.0 in | Wt 329.4 lb

## 2019-11-03 DIAGNOSIS — K219 Gastro-esophageal reflux disease without esophagitis: Secondary | ICD-10-CM | POA: Diagnosis not present

## 2019-11-03 DIAGNOSIS — K760 Fatty (change of) liver, not elsewhere classified: Secondary | ICD-10-CM

## 2019-11-03 DIAGNOSIS — R945 Abnormal results of liver function studies: Secondary | ICD-10-CM | POA: Diagnosis not present

## 2019-11-03 DIAGNOSIS — R7989 Other specified abnormal findings of blood chemistry: Secondary | ICD-10-CM

## 2019-11-03 NOTE — Assessment & Plan Note (Addendum)
Previously elevated AST and ALT.  ALT first noted to be elevated in April 2018.  AST and ALT both elevated in August 2019.  Ultrasound in December 2019 suggestive of nonalcoholic fatty liver disease without cirrhosis.  She completed further serologic work-up for her elevated LFTs in December 2019 which found normal hemoglobin, normal platelets at 249, improvement in LFTs with AST normal at 31 and ALT mildly elevated at 50, normal bilirubin and alk phos.  Ferritin normal at 124.  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  Normal ANA, normal smooth muscle antibodies.  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  CMP in March 2020 with normalized LFTs.  Prior to this, patient had been working on weight loss.  She underwent MRCP on 10/24/19 for elevated ANA that revealed hepatomegaly with severe hepatic steatosis, no biliary tract obstruction or dilation, and spleen unremarkable.  Most recent labs on 10/28/2019 with ALT slightly elevated at 39, normal AST, slight elevation of alk phos at 134, and normal total bilirubin.  Hemoglobin and platelets also within normal limits.  Of note patient had 20 pound weight gain between March-October 2020.  Her A1c also increased from 5-7 during this time.  No regular alcohol. Since being made aware of her results, she has made significant dietary changes and has lost about 9 pounds.  I had offered to refer her to a dietitian to help with weight loss, but patient desires to continue efforts on her own for now.    Elevated LFTs likely secondary to hepatic steatosis.  As alk phos is not 1.5 times the upper limit of normal, this is not consistent with PBC.  I suspect this patient continues to lose weight and get her diabetes under better control, her LFTs will again normalize.  Recommended she see her PCP about getting hepatitis A and hepatitis B vaccine. Instructions for fatty liver: Recommend 1-2# weight loss per week until ideal body weight through exercise  & diet. Low fat/cholesterol diet.   Avoid sweets, sodas, fruit juices, sweetened beverages like tea, etc. Gradually increase exercise from 15 min daily up to 1 hr per day 5 days/week. Limit alcohol use. Follow-up in 3 months.  Recheck HFP prior to next visit. 

## 2019-11-03 NOTE — Patient Instructions (Addendum)
Follow-up with PCP or OBGYN about abnormal menstruation and bacterial vaginosis.   Follow-up with PCP about intermittent chest pain.   Please see your primary care about getting vaccinated for Hep A and B.   Continue working on diet and exercise. Instructions for fatty liver: Recommend 1-2# weight loss per week until ideal body weight through exercise & diet. Low fat/cholesterol diet.   Avoid sweets, sodas, fruit juices, sweetened beverages like tea, etc. Gradually increase exercise from 15 min daily up to 1 hr per day 5 days/week. Limit alcohol use.  Follow-up in 3 months. I would like you to complete blood work prior to this visit to recheck your liver function tests. Please call if questions or concerns prior.   Aliene Altes, PA-C [facility]

## 2019-11-03 NOTE — Assessment & Plan Note (Signed)
GERD symptoms are well controlled on omeprazole 40 mg daily.  Recommended she continue her current medications.  Follow-up in 3 months.  Call if questions or concerns prior.

## 2019-11-03 NOTE — Assessment & Plan Note (Addendum)
Ultrasound in 2019 suggestive of NAFLD without evidence of cirrhosis.  She had elevation of her LFTs which have been improving.  LFTs completely normal in March 2020.  However, most recent labs on 10/28/2019 with ALT slightly elevated to 39 and alk phos, which has historically been within normal limits, slightly elevated at 134. Total bilirubin normal.  Hemoglobin and platelets within normal limits.  She did have an elevated AMA in December 2020 with normal ANA and ASMA.  Recently underwent MRI/MRCP on 10/24/2019 to evaluate for PBC which revealed hepatomegaly with severe hepatic steatosis without biliary ductal dilation.  Spleen appeared unremarkable.  She had gained about 20 pounds between March-October 2020.  Her A1c had also increased from 5-7 between March and October.  Since seeing these results, she has made significant dietary changes and has since lost about 9 pounds.  She is without any signs or symptoms of advanced liver disease.  Denies any regular alcohol use.  As alk phos is not 1.5 times upper limit of normal, this is not diagnostic for PBC.  Suspect this will trend back down as patient continues to work on weight loss and gets her diabetes under better control.  I offered to refer her to a dietitian; however, patient desires to continue working on weight loss on her own for now.  Plan: Recommended she see her PCP about getting vaccinated for hepatitis A and hepatitis B. Instructions for fatty liver: Recommend 1-2# weight loss per week until ideal body weight through exercise & diet. Low fat/cholesterol diet.   Avoid sweets, sodas, fruit juices, sweetened beverages like tea, etc. Gradually increase exercise from 15 min daily up to 1 hr per day 5 days/week. Limit alcohol use. Follow-up in 3 months.  Check HFP prior to next visit.

## 2019-11-04 DIAGNOSIS — G4733 Obstructive sleep apnea (adult) (pediatric): Secondary | ICD-10-CM | POA: Diagnosis not present

## 2019-11-04 NOTE — Progress Notes (Signed)
cc'ed to pcp °

## 2019-11-09 ENCOUNTER — Other Ambulatory Visit: Payer: Self-pay | Admitting: *Deleted

## 2019-11-09 DIAGNOSIS — I10 Essential (primary) hypertension: Secondary | ICD-10-CM

## 2019-11-09 MED ORDER — SPIRONOLACTONE 100 MG PO TABS: 100.0000 mg | ORAL_TABLET | Freq: Every day | ORAL | 1 refills | Status: DC

## 2019-11-12 ENCOUNTER — Other Ambulatory Visit: Payer: Self-pay | Admitting: Family Medicine

## 2019-11-12 DIAGNOSIS — F339 Major depressive disorder, recurrent, unspecified: Secondary | ICD-10-CM

## 2019-11-12 DIAGNOSIS — E11319 Type 2 diabetes mellitus with unspecified diabetic retinopathy without macular edema: Secondary | ICD-10-CM

## 2019-11-20 ENCOUNTER — Other Ambulatory Visit: Payer: Self-pay | Admitting: Family Medicine

## 2019-11-20 DIAGNOSIS — I10 Essential (primary) hypertension: Secondary | ICD-10-CM

## 2019-11-28 ENCOUNTER — Telehealth: Payer: Self-pay | Admitting: Family Medicine

## 2019-11-28 ENCOUNTER — Ambulatory Visit (INDEPENDENT_AMBULATORY_CARE_PROVIDER_SITE_OTHER): Payer: Medicaid Other | Admitting: Family Medicine

## 2019-11-28 ENCOUNTER — Encounter: Payer: Self-pay | Admitting: Family Medicine

## 2019-11-28 ENCOUNTER — Other Ambulatory Visit: Payer: Self-pay

## 2019-11-28 DIAGNOSIS — R300 Dysuria: Secondary | ICD-10-CM | POA: Diagnosis not present

## 2019-11-28 DIAGNOSIS — R350 Frequency of micturition: Secondary | ICD-10-CM | POA: Diagnosis not present

## 2019-11-28 DIAGNOSIS — R3989 Other symptoms and signs involving the genitourinary system: Secondary | ICD-10-CM

## 2019-11-28 MED ORDER — SULFAMETHOXAZOLE-TRIMETHOPRIM 800-160 MG PO TABS: 1.0000 | ORAL_TABLET | Freq: Two times a day (BID) | ORAL | 0 refills | Status: AC

## 2019-11-28 NOTE — Telephone Encounter (Signed)
You can put her in for a phone call and I will call her now. Thanks.

## 2019-11-28 NOTE — Telephone Encounter (Signed)
Do you want to do a visit with her or call in meds?

## 2019-11-28 NOTE — Telephone Encounter (Signed)
Appt scheduled

## 2019-11-28 NOTE — Progress Notes (Signed)
Virtual Visit via telephone Note Due to COVID-19 pandemic this visit was conducted virtually. This visit type was conducted due to national recommendations for restrictions regarding the COVID-19 Pandemic (e.g. social distancing, sheltering in place) in an effort to limit this patient's exposure and mitigate transmission in our community. All issues noted in this document were discussed and addressed.  A physical exam was not performed with this format.   I connected with Heidi Landry on 11/28/2019 at 1515 by telephone and verified that I am speaking with the correct person using two identifiers. Heidi Landry is currently located at home and family is currently with them during visit. The provider, Monia Pouch, FNP is located in their office at time of visit.  I discussed the limitations, risks, security and privacy concerns of performing an evaluation and management service by telephone and the availability of in person appointments. I also discussed with the patient that there may be a patient responsible charge related to this service. The patient expressed understanding and agreed to proceed.  Subjective:  Patient ID: Heidi Landry, female    DOB: 1986-06-13, 33 y.o.   MRN: [id number]  Chief Complaint:  Dysuria   HPI: Heidi Landry is a 33 y.o. female presenting on 11/28/2019 for Dysuria   Dysuria  This is a new problem. The current episode started yesterday. The problem occurs every urination. The problem has been gradually worsening. The quality of the pain is described as burning and aching. The pain is at a severity of 3/10. The pain is mild. There has been no fever. Associated symptoms include frequency and urgency. Pertinent negatives include no chills, discharge, flank pain, hematuria, hesitancy, nausea, possible pregnancy, sweats or vomiting. She has tried nothing for the symptoms.     Relevant past medical, surgical, family, and social history reviewed and updated as  indicated.  Allergies and medications reviewed and updated.   Past Medical History:  Diagnosis Date  . Adrenal adenoma    right  . Anxiety   . Atrial fibrillation ([place])   . Carpal tunnel syndrome, bilateral   . Chest pain   . Chronic back pain   . Degenerative disc disease   . Edema   . Gestational diabetes mellitus, antepartum   . Gestational diabetes mellitus, class A2/B 08/08/2015   Early 2hr: 84/186/117 @ 12wks   A1/B  Notified & dietician referral ordered 08/08/15   . Hypertension   . IBS (irritable bowel syndrome)   . Panic attacks   . Placental abruption in third trimester 12/07/2015  . Post partum depression   . Sleep apnea   . Spondylosis   . Tendonitis   . Thoracic spine fracture ([facility]) 04/13/2012  . Varicose veins of left lower extremity with complications 39/02/91    Past Surgical History:  Procedure Laterality Date  . CESAREAN SECTION N/A 12/08/2015   Procedure: CESAREAN SECTION;  Surgeon: Jonnie Kind, MD;  Location: [facility];  Service: Obstetrics;  Laterality: N/A;  . CHOLECYSTECTOMY    . KNEE SURGERY Right 01/29/2019   at [facility] in [place], [place]; s/p fall and deel leg laceration    Social History   Socioeconomic History  . Marital status: Married    Spouse name: Not on file  . Number of children: Not on file  . Years of education: 10  . Highest education level: Not on file  Occupational History    Employer: [employer]  Social Needs  . Financial resource strain: Not  on file  . Food insecurity    Worry: Not on file    Inability: Not on file  . Transportation needs    Medical: Not on file    Non-medical: Not on file  Tobacco Use  . Smoking status: Current Every Day Smoker    Packs/day: 0.50    Years: 9.00    Pack years: 4.50    Types: Cigarettes    Start date: 05/25/2006  . Smokeless tobacco: Never Used  Substance and Sexual Activity  . Alcohol use: Yes    Alcohol/week: 0.0 standard drinks    Comment: rare   . Drug use: No  . Sexual activity: Yes    Birth control/protection: Pill  Lifestyle  . Physical activity    Days per week: Not on file    Minutes per session: Not on file  . Stress: Not on file  Relationships  . Social Herbalist on phone: Not on file    Gets together: Not on file    Attends religious service: Not on file    Active member of club or organization: Not on file    Attends meetings of clubs or organizations: Not on file    Relationship status: Not on file  . Intimate partner violence    Fear of current or ex partner: Not on file    Emotionally abused: Not on file    Physically abused: Not on file    Forced sexual activity: Not on file  Other Topics Concern  . Not on file  Social History Narrative  . Not on file    Outpatient Encounter Medications as of 11/28/2019  Medication Sig  . albuterol (PROVENTIL HFA;VENTOLIN HFA) 108 (90 Base) MCG/ACT inhaler Inhale 2 puffs into the lungs every 6 (six) hours as needed for wheezing or shortness of breath.  Marland Kitchen atorvastatin (LIPITOR) 20 MG tablet Take 1 tablet (20 mg total) by mouth daily.  . bevacizumab (AVASTIN) 400 MG/16ML SOLN Inject into the vein once. Every 8 weeks injected into right eye.  Marland Kitchen buPROPion (WELLBUTRIN SR) 150 MG 12 hr tablet Take 1 tablet by mouth twice daily  . Butoconazole Nitrate, 1 Dose, 2 % CREA Place 5 g vaginally at bedtime. (Patient taking differently: Place 5 g vaginally as needed. )  . cyclobenzaprine (FLEXERIL) 10 MG tablet Take 1 tablet by mouth three times daily as needed for muscle spasm  . dicyclomine (BENTYL) 20 MG tablet Take 1 tablet (20 mg total) by mouth 4 (four) times daily as needed for spasms.  Noelle Penner ALLERGY RELIEF, CETIRIZINE, 10 MG tablet Take 1 tablet by mouth once daily  . Exenatide ER (BYDUREON) 2 MG PEN INJECT 2MG SUBCUTANEOUSLY ONCE A WEEK  . fluconazole (DIFLUCAN) 150 MG tablet Take 1 tablet (150 mg total) by mouth every three (3) days as needed.  . fluticasone  (FLONASE) 50 MCG/ACT nasal spray Place 2 sprays into both nostrils daily.  . hydrochlorothiazide (HYDRODIURIL) 12.5 MG tablet Take 1 tablet (12.5 mg total) by mouth daily.  . metFORMIN (GLUCOPHAGE-XR) 500 MG 24 hr tablet TAKE 2 TABLETS BY MOUTH TWICE DAILY (Patient taking differently: Take 500 mg by mouth daily. TAKE 2 TABLETS BY MOUTH TWICE DAILY)  . metoprolol succinate (TOPROL-XL) 25 MG 24 hr tablet TAKE 1 TABLET BY MOUTH ONCE DAILY WITH MEAL OR  IMMEDIATELY  FOLLOWING  . norethindrone (ORTHO MICRONOR) 0.35 MG tablet Take 1 tablet (0.35 mg total) by mouth daily.  Marland Kitchen omeprazole (PRILOSEC) 40 MG capsule Take  1 capsule (40 mg total) by mouth daily.  . podofilox (CONDYLOX) 0.5 % gel Apply topically 2 (two) times daily.  Marland Kitchen rOPINIRole (REQUIP) 0.5 MG tablet Take 0.5 mg by mouth at bedtime.  . senna-docusate (SENOKOT-S) 8.6-50 MG tablet Take 1 tablet by mouth 2 (two) times daily at 10 AM and 5 PM. Takes every other day  . sertraline (ZOLOFT) 100 MG tablet Take 2 tablets (200 mg total) by mouth daily.  Marland Kitchen spironolactone (ALDACTONE) 100 MG tablet Take 1 tablet (100 mg total) by mouth daily.  Marland Kitchen sulfamethoxazole-trimethoprim (BACTRIM DS) 800-160 MG tablet Take 1 tablet by mouth 2 (two) times daily for 7 days.   No facility-administered encounter medications on file as of 11/28/2019.     No Known Allergies  Review of Systems  Constitutional: Negative for activity change, appetite change, chills, fatigue and fever.  HENT: Negative.   Eyes: Negative.   Respiratory: Negative for cough, chest tightness and shortness of breath.   Cardiovascular: Negative for chest pain, palpitations and leg swelling.  Gastrointestinal: Negative for abdominal pain, blood in stool, constipation, diarrhea, nausea and vomiting.  Endocrine: Negative.   Genitourinary: Positive for dysuria, frequency and urgency. Negative for decreased urine volume, difficulty urinating, flank pain, hematuria, hesitancy, vaginal bleeding, vaginal  discharge and vaginal pain.  Musculoskeletal: Negative for arthralgias, back pain and myalgias.  Skin: Negative.   Allergic/Immunologic: Negative.   Neurological: Negative for dizziness and headaches.  Hematological: Negative.   Psychiatric/Behavioral: Negative for confusion, hallucinations, sleep disturbance and suicidal ideas.  All other systems reviewed and are negative.        Observations/Objective: No vital signs or physical exam, this was a telephone or virtual health encounter.  Pt alert and oriented, answers all questions appropriately, and able to speak in full sentences.    Assessment and Plan: Aimie was seen today for dysuria.  Diagnoses and all orders for this visit:  Dysuria Frequency of micturition Suspected UTI -     sulfamethoxazole-trimethoprim (BACTRIM DS) 800-160 MG tablet; Take 1 tablet by mouth 2 (two) times daily for 7 days.     Follow Up Instructions: Return if symptoms worsen or fail to improve.    I discussed the assessment and treatment plan with the patient. The patient was provided an opportunity to ask questions and all were answered. The patient agreed with the plan and demonstrated an understanding of the instructions.   The patient was advised to call back or seek an in-person evaluation if the symptoms worsen or if the condition fails to improve as anticipated.  The above assessment and management plan was discussed with the patient. The patient verbalized understanding of and has agreed to the management plan. Patient is aware to call the clinic if they develop any new symptoms or if symptoms persist or worsen. Patient is aware when to return to the clinic for a follow-up visit. Patient educated on when it is appropriate to go to the emergency department.    I provided 15 minutes of non-face-to-face time during this encounter. The call started at 1515. The call ended at 1530. The other time was used for coordination of care.    Monia Pouch, FNP-C [facility] [address] [place], [place] [address] [phone] 11/28/2019

## 2019-11-28 NOTE — Telephone Encounter (Signed)
What symptoms do you have? Pressure, burning when she pees  How long have you been sick? Today  Have you been seen for this problem? Yes  If your provider decides to give you a prescription, which pharmacy would you like for it to be sent to? [facility] in [place]   Patient informed that this information will be sent to the clinical staff for review and that they should receive a follow up call.

## 2019-11-29 ENCOUNTER — Other Ambulatory Visit: Payer: Self-pay | Admitting: Family Medicine

## 2019-11-29 DIAGNOSIS — I10 Essential (primary) hypertension: Secondary | ICD-10-CM

## 2019-12-07 ENCOUNTER — Other Ambulatory Visit: Payer: Self-pay

## 2019-12-07 ENCOUNTER — Ambulatory Visit (INDEPENDENT_AMBULATORY_CARE_PROVIDER_SITE_OTHER): Payer: Medicaid Other | Admitting: Obstetrics and Gynecology

## 2019-12-07 ENCOUNTER — Encounter: Payer: Self-pay | Admitting: Obstetrics and Gynecology

## 2019-12-07 VITALS — BP 143/67 | HR 112 | Ht 62.0 in | Wt 325.6 lb

## 2019-12-07 DIAGNOSIS — R1032 Left lower quadrant pain: Secondary | ICD-10-CM | POA: Insufficient documentation

## 2019-12-07 MED ORDER — ACETAMINOPHEN-CODEINE #3 300-30 MG PO TABS: 2.0000 | ORAL_TABLET | ORAL | 0 refills | Status: DC | PRN

## 2019-12-07 NOTE — Progress Notes (Addendum)
Patient ID: Heidi Landry, female   DOB: 03-Jun-1986, 33 y.o.   MRN: [id number]    [facility] Visit  @DATE @            Patient name: Heidi Landry MRN [id number]  Date of birth: 12-12-1986  CC & HPI:  [name] is a 33 y.o. female presenting today for an ovarian cyst. In 2010-2014, she had a lot of ovarian cysts that burst. At this time, she lived in [place].  When she moved back to [place], she was put on Progesterone BCPs and "everything has been fine". She was taken off her BCPs at her last visit for 5 days so she could have a period. She did, and then restarted the pills.  She had a period in early December and felt a lot more cramping than what she was used to. Yesterday morning, all of a sudden, she began experiencing severe abdominal pain/cramping. She figured it was another cyst, so she came in today to see if there was anything that she could do to help the pain. The patient denies fever, diarrhea, loose bowel movements chills or any other symptoms or complaints at this time.   ROS:  ROS  + abdominal pain - fever - chills All systems are negative except as noted in the HPI and PMH.   Pertinent History Reviewed:   Reviewed: Medical         Past Medical History:  Diagnosis Date   Adrenal adenoma    right   Anxiety    Atrial fibrillation ([facility])    Carpal tunnel syndrome, bilateral    Chest pain    Chronic back pain    Degenerative disc disease    Edema    Gestational diabetes mellitus, antepartum    Gestational diabetes mellitus, class A2/B 08/08/2015   Early 2hr: 84/186/117 @ 12wks   A1/B  Notified & dietician referral ordered 08/08/15    Hypertension    IBS (irritable bowel syndrome)    Panic attacks    Placental abruption in third trimester 12/07/2015   Post partum depression    Sleep apnea    Spondylosis    Tendonitis    Thoracic spine fracture ([place]) 04/13/2012   Varicose veins of left lower extremity with complications 62/70/3500                               Surgical Hx:    Past Surgical History:  Procedure Laterality Date   CESAREAN SECTION N/A 12/08/2015   Procedure: CESAREAN SECTION;  Surgeon: Jonnie Kind, MD;  Location: [facility];  Service: Obstetrics;  Laterality: N/A;   CHOLECYSTECTOMY     KNEE SURGERY Right 01/29/2019   at [facility] in [place], [place]; s/p fall and deel leg laceration   Medications: Reviewed & Updated - see associated section                       Current Outpatient Medications:    albuterol (PROVENTIL HFA;VENTOLIN HFA) 108 (90 Base) MCG/ACT inhaler, Inhale 2 puffs into the lungs every 6 (six) hours as needed for wheezing or shortness of breath., Disp: 1 Inhaler, Rfl: 5   atorvastatin (LIPITOR) 20 MG tablet, Take 1 tablet (20 mg total) by mouth daily., Disp: 90 tablet, Rfl: 3   bevacizumab (AVASTIN) 400 MG/16ML SOLN, Inject into the vein once. Every 8 weeks injected into right eye., Disp: ,  Rfl:    buPROPion (WELLBUTRIN SR) 150 MG 12 hr tablet, Take 1 tablet by mouth twice daily, Disp: 60 tablet, Rfl: 0   Butoconazole Nitrate, 1 Dose, 2 % CREA, Place 5 g vaginally at bedtime. (Patient taking differently: Place 5 g vaginally as needed. ), Disp: 10 g, Rfl: 2   cyclobenzaprine (FLEXERIL) 10 MG tablet, Take 1 tablet by mouth three times daily as needed for muscle spasm, Disp: 90 tablet, Rfl: 0   dicyclomine (BENTYL) 20 MG tablet, Take 1 tablet (20 mg total) by mouth 4 (four) times daily as needed for spasms., Disp: 60 tablet, Rfl: 3   EQ ALLERGY RELIEF, CETIRIZINE, 10 MG tablet, Take 1 tablet by mouth once daily, Disp: 90 tablet, Rfl: 0   Exenatide ER (BYDUREON) 2 MG PEN, INJECT 2MG SUBCUTANEOUSLY ONCE A WEEK, Disp: 12 each, Rfl: 0   fluconazole (DIFLUCAN) 150 MG tablet, Take 1 tablet (150 mg total) by mouth every three (3) days as needed., Disp: 3 tablet, Rfl: 0   fluticasone (FLONASE) 50 MCG/ACT nasal spray, Place 2 sprays into both nostrils daily., Disp: 16 g,  Rfl: 6   hydrochlorothiazide (HYDRODIURIL) 12.5 MG tablet, Take 1 tablet by mouth once daily, Disp: 90 tablet, Rfl: 0   metFORMIN (GLUCOPHAGE-XR) 500 MG 24 hr tablet, TAKE 2 TABLETS BY MOUTH TWICE DAILY (Patient taking differently: Take 500 mg by mouth daily. TAKE 2 TABLETS BY MOUTH TWICE DAILY), Disp: 360 tablet, Rfl: 0   metoprolol succinate (TOPROL-XL) 25 MG 24 hr tablet, TAKE 1 TABLET BY MOUTH ONCE DAILY WITH MEAL OR  IMMEDIATELY  FOLLOWING, Disp: 90 tablet, Rfl: 0   norethindrone (ORTHO MICRONOR) 0.35 MG tablet, Take 1 tablet (0.35 mg total) by mouth daily., Disp: 1 Package, Rfl: 2   omeprazole (PRILOSEC) 40 MG capsule, Take 1 capsule (40 mg total) by mouth daily., Disp: 90 capsule, Rfl: 1   podofilox (CONDYLOX) 0.5 % gel, Apply topically 2 (two) times daily., Disp: 3.5 g, Rfl: 11   rOPINIRole (REQUIP) 0.5 MG tablet, Take 0.5 mg by mouth at bedtime., Disp: , Rfl:    senna-docusate (SENOKOT-S) 8.6-50 MG tablet, Take 1 tablet by mouth 2 (two) times daily at 10 AM and 5 PM. Takes every other day, Disp: , Rfl:    sertraline (ZOLOFT) 100 MG tablet, Take 2 tablets (200 mg total) by mouth daily., Disp: 60 tablet, Rfl: 6   spironolactone (ALDACTONE) 100 MG tablet, Take 1 tablet (100 mg total) by mouth daily., Disp: 90 tablet, Rfl: 1   Social History: Reviewed -  reports that she has been smoking cigarettes. She started smoking about 13 years ago. She has a 4.50 pack-year smoking history. She has never used smokeless tobacco.  Objective Findings:  Vitals: Blood pressure (!) 143/67, pulse (!) 112, height 5' 2"  (1.575 m), weight (!) 325 lb 9.6 oz (147.7 kg), last menstrual period 11/26/2019.  PHYSICAL EXAMINATION General appearance - alert, well appearing, and in no distress, oriented to person, place, and time and overweight Mental status - alert, oriented to person, place, and time, normal mood, behavior, speech, dress, motor activity, and thought processes, affect appropriate to  mood  PELVIC DEFERRED  Assessment & Plan:   A:  1.  episodic LLQ pain, uncertain etiology 2. Morbid obesity Body mass index is 59.55 kg/m.  P:  1.  Symptomatic relief with Tylenol #3 prn 2. 2 if persists, consider TV u/s  By signing my name below, I, De Burrs, attest that this documentation has been prepared  under the direction and in the presence of Jonnie Kind, MD. Electronically Signed: De Burrs, Medical Scribe. 12/07/19. 2:45 PM.  I personally performed the services described in this documentation, which was SCRIBED in my presence. The recorded information has been reviewed and considered accurate. It has been edited as necessary during review. Jonnie Kind, MD

## 2019-12-07 NOTE — Progress Notes (Signed)
Patient ID: Heidi Landry, female   DOB: December 01, 1986, 33 y.o.   MRN: [id number]    [facility] Visit  @DATE @            Patient name: Heidi Landry MRN [id number]  Date of birth: 16-Oct-1986  CC & HPI:  Heidi Landry is a 33 y.o. female presenting today for an ovarian cyst. In 2010-2014, she had a lot of ovarian cysts that burst. At this time, she lived in [place].  When she moved back to [place], she was put on Progesterone BCPs and "everything has been fine". She was taken off her BCPs at her last visit for 5 days so she could have a period. She did, and then restarted the pills.  She had a period in early December and felt a lot more cramping than what she was used to. Yesterday morning, all of a sudden, she began experiencing severe abdominal pain/cramping. She figured it was another cyst, so she came in today to see if there was anything that she could do to help the pain. The patient denies fever, diarrhea, loose bowel movements chills or any other symptoms or complaints at this time.   ROS:  ROS  + abdominal pain - fever - chills All systems are negative except as noted in the HPI and PMH.   Pertinent History Reviewed:   Reviewed: Medical         Past Medical History:  Diagnosis Date  . Adrenal adenoma    right  . Anxiety   . Atrial fibrillation ([facility])   . Carpal tunnel syndrome, bilateral   . Chest pain   . Chronic back pain   . Degenerative disc disease   . Edema   . Gestational diabetes mellitus, antepartum   . Gestational diabetes mellitus, class A2/B 08/08/2015   Early 2hr: 84/186/117 @ 12wks   A1/B  Notified & dietician referral ordered 08/08/15   . Hypertension   . IBS (irritable bowel syndrome)   . Panic attacks   . Placental abruption in third trimester 12/07/2015  . Post partum depression   . Sleep apnea   . Spondylosis   . Tendonitis   . Thoracic spine fracture ([facility]) 04/13/2012  . Varicose veins of left lower extremity with complications 89/38/1017                               Surgical Hx:    Past Surgical History:  Procedure Laterality Date  . CESAREAN SECTION N/A 12/08/2015   Procedure: CESAREAN SECTION;  Surgeon: Jonnie Kind, MD;  Location: [facility];  Service: Obstetrics;  Laterality: N/A;  . CHOLECYSTECTOMY    . KNEE SURGERY Right 01/29/2019   at [facility] in [place], [place]; s/p fall and deel leg laceration   Medications: Reviewed & Updated - see associated section                       Current Outpatient Medications:  .  albuterol (PROVENTIL HFA;VENTOLIN HFA) 108 (90 Base) MCG/ACT inhaler, Inhale 2 puffs into the lungs every 6 (six) hours as needed for wheezing or shortness of breath., Disp: 1 Inhaler, Rfl: 5 .  atorvastatin (LIPITOR) 20 MG tablet, Take 1 tablet (20 mg total) by mouth daily., Disp: 90 tablet, Rfl: 3 .  bevacizumab (AVASTIN) 400 MG/16ML SOLN, Inject into the vein once. Every 8 weeks injected into right eye., Disp: ,  Rfl:  .  buPROPion (WELLBUTRIN SR) 150 MG 12 hr tablet, Take 1 tablet by mouth twice daily, Disp: 60 tablet, Rfl: 0 .  Butoconazole Nitrate, 1 Dose, 2 % CREA, Place 5 g vaginally at bedtime. (Patient taking differently: Place 5 g vaginally as needed. ), Disp: 10 g, Rfl: 2 .  cyclobenzaprine (FLEXERIL) 10 MG tablet, Take 1 tablet by mouth three times daily as needed for muscle spasm, Disp: 90 tablet, Rfl: 0 .  dicyclomine (BENTYL) 20 MG tablet, Take 1 tablet (20 mg total) by mouth 4 (four) times daily as needed for spasms., Disp: 60 tablet, Rfl: 3 .  EQ ALLERGY RELIEF, CETIRIZINE, 10 MG tablet, Take 1 tablet by mouth once daily, Disp: 90 tablet, Rfl: 0 .  Exenatide ER (BYDUREON) 2 MG PEN, INJECT 2MG SUBCUTANEOUSLY ONCE A WEEK, Disp: 12 each, Rfl: 0 .  fluconazole (DIFLUCAN) 150 MG tablet, Take 1 tablet (150 mg total) by mouth every three (3) days as needed., Disp: 3 tablet, Rfl: 0 .  fluticasone (FLONASE) 50 MCG/ACT nasal spray, Place 2 sprays into both nostrils daily., Disp: 16 g,  Rfl: 6 .  hydrochlorothiazide (HYDRODIURIL) 12.5 MG tablet, Take 1 tablet by mouth once daily, Disp: 90 tablet, Rfl: 0 .  metFORMIN (GLUCOPHAGE-XR) 500 MG 24 hr tablet, TAKE 2 TABLETS BY MOUTH TWICE DAILY (Patient taking differently: Take 500 mg by mouth daily. TAKE 2 TABLETS BY MOUTH TWICE DAILY), Disp: 360 tablet, Rfl: 0 .  metoprolol succinate (TOPROL-XL) 25 MG 24 hr tablet, TAKE 1 TABLET BY MOUTH ONCE DAILY WITH MEAL OR  IMMEDIATELY  FOLLOWING, Disp: 90 tablet, Rfl: 0 .  norethindrone (ORTHO MICRONOR) 0.35 MG tablet, Take 1 tablet (0.35 mg total) by mouth daily., Disp: 1 Package, Rfl: 2 .  omeprazole (PRILOSEC) 40 MG capsule, Take 1 capsule (40 mg total) by mouth daily., Disp: 90 capsule, Rfl: 1 .  podofilox (CONDYLOX) 0.5 % gel, Apply topically 2 (two) times daily., Disp: 3.5 g, Rfl: 11 .  rOPINIRole (REQUIP) 0.5 MG tablet, Take 0.5 mg by mouth at bedtime., Disp: , Rfl:  .  senna-docusate (SENOKOT-S) 8.6-50 MG tablet, Take 1 tablet by mouth 2 (two) times daily at 10 AM and 5 PM. Takes every other day, Disp: , Rfl:  .  sertraline (ZOLOFT) 100 MG tablet, Take 2 tablets (200 mg total) by mouth daily., Disp: 60 tablet, Rfl: 6 .  spironolactone (ALDACTONE) 100 MG tablet, Take 1 tablet (100 mg total) by mouth daily., Disp: 90 tablet, Rfl: 1   Social History: Reviewed -  reports that she has been smoking cigarettes. She started smoking about 13 years ago. She has a 4.50 pack-year smoking history. She has never used smokeless tobacco.  Objective Findings:  Vitals: Blood pressure (!) 143/67, pulse (!) 112, height 5' 2"  (1.575 m), weight (!) 325 lb 9.6 oz (147.7 kg), last menstrual period 11/26/2019.  PHYSICAL EXAMINATION General appearance - alert, well appearing, and in no distress, oriented to person, place, and time and overweight Mental status - alert, oriented to person, place, and time, normal mood, behavior, speech, dress, motor activity, and thought processes, affect appropriate to  mood  PELVIC DEFERRED  Assessment & Plan:   A:  1.  episodic LLQ pain, uncertain etiology 2. Morbid obesity Body mass index is 59.55 kg/m.  P:  1.  Symptomatic relief with Tylenol #3 prn 2. 2 if persists, consider TV u/s  By signing my name below, I, De Burrs, attest that this documentation has been prepared  under the direction and in the presence of Jonnie Kind, MD. Electronically Signed: De Burrs, Medical Scribe. 12/07/19. 2:45 PM.  I personally performed the services described in this documentation, which was SCRIBED in my presence. The recorded information has been reviewed and considered accurate. It has been edited as necessary during review. Jonnie Kind, MD   Duplicate of todays note .

## 2019-12-18 ENCOUNTER — Other Ambulatory Visit: Payer: Self-pay | Admitting: Family Medicine

## 2019-12-18 DIAGNOSIS — F339 Major depressive disorder, recurrent, unspecified: Secondary | ICD-10-CM

## 2019-12-28 ENCOUNTER — Other Ambulatory Visit: Payer: Self-pay | Admitting: Family Medicine

## 2019-12-28 DIAGNOSIS — J3089 Other allergic rhinitis: Secondary | ICD-10-CM

## 2020-01-17 ENCOUNTER — Other Ambulatory Visit: Payer: Self-pay | Admitting: Family Medicine

## 2020-01-17 DIAGNOSIS — F339 Major depressive disorder, recurrent, unspecified: Secondary | ICD-10-CM

## 2020-01-24 ENCOUNTER — Other Ambulatory Visit: Payer: Self-pay

## 2020-01-24 DIAGNOSIS — K760 Fatty (change of) liver, not elsewhere classified: Secondary | ICD-10-CM

## 2020-01-27 ENCOUNTER — Other Ambulatory Visit: Payer: Self-pay

## 2020-01-27 ENCOUNTER — Ambulatory Visit: Payer: Medicaid Other | Admitting: Gastroenterology

## 2020-01-27 ENCOUNTER — Encounter: Payer: Self-pay | Admitting: Gastroenterology

## 2020-01-27 VITALS — BP 135/76 | HR 95 | Temp 97.5°F | Ht 62.0 in | Wt 324.4 lb

## 2020-01-27 DIAGNOSIS — R1011 Right upper quadrant pain: Secondary | ICD-10-CM | POA: Diagnosis not present

## 2020-01-27 DIAGNOSIS — K760 Fatty (change of) liver, not elsewhere classified: Secondary | ICD-10-CM | POA: Diagnosis not present

## 2020-01-27 NOTE — Patient Instructions (Signed)
1. Please go for labs as discussed next week. We will be in touch as results available. 2. Continue to work towards further weight loss. Given physical limitations your diet is so important. If you decide you would like to see nutritionist or go to [facility] Weight loss program, please let me know.

## 2020-01-27 NOTE — Assessment & Plan Note (Signed)
2-day history of intermittent right upper quadrant pain occurring about 2 weeks ago.  Etiology unclear.  She had constipation during this time which may be contributing to it.  Possibly musculoskeletal.  Cannot exclude biliary etiology.  Symptoms now resolved.  Continue to monitor.

## 2020-01-27 NOTE — Progress Notes (Signed)
Primary Care Physician: Baruch Gouty, FNP  Primary Gastroenterologist:  Garfield Cornea, MD   Chief Complaint  Patient presents with  . Abdominal Pain    ruq    HPI: Heidi Landry is a 34 y.o. female here for follow-up of abnormal LFTs.  Last seen in November.  She has a history of elevated BMI, diabetes, hypertension, fatty liver disease, and GERD.  History of elevated mitochondrial antibodies.  MRCP October 2020 with hepatomegaly with severe hepatic steatosis, small right adrenal adenoma but otherwise unremarkable.  Patient's father had colon cancer diagnosed in his early 77s, required partial colectomy.  2 weeks ago, had some intermittent sharp in ruq for two days. Since then no pain. Had been staying constipation. Now more regular.  Not sure if pain related to constipation.  Worried about her liver.  Pain not postprandial. Shooting sharp pain.  No associated N/V. No heartburn.  Has episodes of short-lived nausea lasting for seconds to minutes.  Has not been strict with her diet lately.  Unable to do significant exercise regimen since leg injury in February 2020.  Has been to bariatric surgery interest meetings and decided it was not for her.  She wants to lose weight naturally.  Current Outpatient Medications  Medication Sig Dispense Refill  . acetaminophen-codeine (TYLENOL #3) 300-30 MG tablet Take 2 tablets by mouth every 4 (four) hours as needed for moderate pain. 30 tablet 0  . albuterol (PROVENTIL HFA;VENTOLIN HFA) 108 (90 Base) MCG/ACT inhaler Inhale 2 puffs into the lungs every 6 (six) hours as needed for wheezing or shortness of breath. 1 Inhaler 5  . atorvastatin (LIPITOR) 20 MG tablet Take 1 tablet (20 mg total) by mouth daily. 90 tablet 3  . bevacizumab (AVASTIN) 400 MG/16ML SOLN Inject into the vein once. Every 8 weeks injected into right eye.    Marland Kitchen buPROPion (WELLBUTRIN SR) 150 MG 12 hr tablet Take 1 tablet by mouth twice daily 60 tablet 0  . Butoconazole Nitrate, 1  Dose, 2 % CREA Place 5 g vaginally at bedtime. (Patient taking differently: Place 5 g vaginally as needed. ) 10 g 2  . cyclobenzaprine (FLEXERIL) 10 MG tablet Take 1 tablet by mouth three times daily as needed for muscle spasm 90 tablet 0  . dicyclomine (BENTYL) 20 MG tablet Take 1 tablet (20 mg total) by mouth 4 (four) times daily as needed for spasms. 60 tablet 3  . EQ ALLERGY RELIEF, CETIRIZINE, 10 MG tablet Take 1 tablet by mouth once daily 90 tablet 1  . Exenatide ER (BYDUREON) 2 MG PEN INJECT 2MG SUBCUTANEOUSLY ONCE A WEEK 12 each 0  . fluconazole (DIFLUCAN) 150 MG tablet Take 1 tablet (150 mg total) by mouth every three (3) days as needed. 3 tablet 0  . fluticasone (FLONASE) 50 MCG/ACT nasal spray Place 2 sprays into both nostrils daily. 16 g 6  . hydrochlorothiazide (HYDRODIURIL) 12.5 MG tablet Take 1 tablet by mouth once daily 90 tablet 0  . metFORMIN (GLUCOPHAGE-XR) 500 MG 24 hr tablet TAKE 2 TABLETS BY MOUTH TWICE DAILY (Patient taking differently: Take 500 mg by mouth daily. TAKE 2 TABLETS BY MOUTH TWICE DAILY) 360 tablet 0  . metoprolol succinate (TOPROL-XL) 25 MG 24 hr tablet TAKE 1 TABLET BY MOUTH ONCE DAILY WITH MEAL OR  IMMEDIATELY  FOLLOWING 90 tablet 0  . norethindrone (ORTHO MICRONOR) 0.35 MG tablet Take 1 tablet (0.35 mg total) by mouth daily. 1 Package 2  . omeprazole (PRILOSEC) 40 MG  capsule Take 1 capsule (40 mg total) by mouth daily. 90 capsule 1  . podofilox (CONDYLOX) 0.5 % gel Apply topically 2 (two) times daily. 3.5 g 11  . rOPINIRole (REQUIP) 0.5 MG tablet Take 0.5 mg by mouth at bedtime.    . senna-docusate (SENOKOT-S) 8.6-50 MG tablet Take 1 tablet by mouth 2 (two) times daily at 10 AM and 5 PM. Takes every other day    . sertraline (ZOLOFT) 100 MG tablet Take 2 tablets (200 mg total) by mouth daily. 60 tablet 6  . spironolactone (ALDACTONE) 100 MG tablet Take 1 tablet (100 mg total) by mouth daily. 90 tablet 1   No current facility-administered medications for this  visit.    Allergies as of 01/27/2020  . (No Known Allergies)    ROS:  General: Negative for anorexia, weight loss, fever, chills, fatigue, weakness. ENT: Negative for hoarseness, difficulty swallowing , nasal congestion. CV: Negative for chest pain, angina, palpitations, dyspnea on exertion, peripheral edema.  Respiratory: Negative for dyspnea at rest, dyspnea on exertion, cough, sputum, wheezing.  GI: See history of present illness. GU:  Negative for dysuria, hematuria, urinary incontinence, urinary frequency, nocturnal urination.  Endo: Negative for unusual weight change.    Physical Examination:   BP 135/76   Pulse 95   Temp (!) 97.5 F (36.4 C) (Temporal)   Ht 5' 2"  (1.575 m)   Wt (!) 324 lb 6.4 oz (147.1 kg)   LMP 01/13/2020 (Approximate)   BMI 59.33 kg/m   General: Morbidly obese female in no acute distress.  Eyes: No icterus. Mouth: masked. Lungs: Clear to auscultation bilaterally.  Heart: Regular rate and rhythm, no murmurs rubs or gallops.  Abdomen: Bowel sounds are normal, nontender, nondistended, no hepatosplenomegaly or masses, no abdominal bruits or hernia , no rebound or guarding.   Extremities: No lower extremity edema. No clubbing or deformities. Neuro: Alert and oriented x 4   Skin: Warm and dry, no jaundice.   Psych: Alert and cooperative, normal mood and affect.  Labs:  Lab Results  Component Value Date   CREATININE 0.72 10/28/2019   BUN 10 10/28/2019   NA 141 10/28/2019   K 4.3 10/28/2019   CL 98 10/28/2019   CO2 28 10/28/2019   Lab Results  Component Value Date   ALT 39 (H) 10/28/2019   AST 32 10/28/2019   ALKPHOS 134 (H) 10/28/2019   BILITOT 0.5 10/28/2019   Lab Results  Component Value Date   WBC 12.0 (H) 10/28/2019   HGB 12.8 10/28/2019   HCT 39.9 10/28/2019   MCV 80 10/28/2019   PLT 224 10/28/2019   Lab Results  Component Value Date   TSH 1.570 10/28/2019   Lab Results  Component Value Date   HGBA1C 7.0 (H) 10/28/2019      Imaging Studies: No results found.

## 2020-01-27 NOTE — Assessment & Plan Note (Addendum)
Abnormal LFTs, hepatomegaly, elevated AMA.  Other serologies negative.  MRCP with hepatomegaly and severe hepatic steatosis but no biliary ductal dilation.  Discussed need for significant weight loss, beneficial for many of her medical conditions but especially her liver.  We discussed referral to nutritionist, wellness coach, or for bariatric surgery.  At this time she wants to try to lose weight on her own.  She has been successful in losing about 40 pounds in 2019 although has gained some of this back.  We will update labs in the near future.  Further recommendations to follow.

## 2020-01-30 ENCOUNTER — Ambulatory Visit (INDEPENDENT_AMBULATORY_CARE_PROVIDER_SITE_OTHER): Payer: Medicaid Other | Admitting: Family Medicine

## 2020-01-30 ENCOUNTER — Other Ambulatory Visit: Payer: Self-pay

## 2020-01-30 ENCOUNTER — Telehealth: Payer: Self-pay | Admitting: Family Medicine

## 2020-01-30 ENCOUNTER — Encounter: Payer: Self-pay | Admitting: Family Medicine

## 2020-01-30 VITALS — BP 109/66 | HR 93 | Temp 98.9°F | Resp 20 | Ht 62.0 in | Wt 324.0 lb

## 2020-01-30 DIAGNOSIS — E785 Hyperlipidemia, unspecified: Secondary | ICD-10-CM | POA: Insufficient documentation

## 2020-01-30 DIAGNOSIS — K582 Mixed irritable bowel syndrome: Secondary | ICD-10-CM

## 2020-01-30 DIAGNOSIS — E119 Type 2 diabetes mellitus without complications: Secondary | ICD-10-CM

## 2020-01-30 DIAGNOSIS — E1169 Type 2 diabetes mellitus with other specified complication: Secondary | ICD-10-CM | POA: Insufficient documentation

## 2020-01-30 DIAGNOSIS — I10 Essential (primary) hypertension: Secondary | ICD-10-CM | POA: Diagnosis not present

## 2020-01-30 DIAGNOSIS — F339 Major depressive disorder, recurrent, unspecified: Secondary | ICD-10-CM

## 2020-01-30 DIAGNOSIS — K219 Gastro-esophageal reflux disease without esophagitis: Secondary | ICD-10-CM | POA: Diagnosis not present

## 2020-01-30 DIAGNOSIS — N898 Other specified noninflammatory disorders of vagina: Secondary | ICD-10-CM | POA: Diagnosis not present

## 2020-01-30 DIAGNOSIS — G4733 Obstructive sleep apnea (adult) (pediatric): Secondary | ICD-10-CM | POA: Diagnosis not present

## 2020-01-30 DIAGNOSIS — E1159 Type 2 diabetes mellitus with other circulatory complications: Secondary | ICD-10-CM

## 2020-01-30 DIAGNOSIS — G44209 Tension-type headache, unspecified, not intractable: Secondary | ICD-10-CM | POA: Diagnosis not present

## 2020-01-30 LAB — URINALYSIS, COMPLETE
Bilirubin, UA: NEGATIVE
Glucose, UA: NEGATIVE
Ketones, UA: NEGATIVE
Leukocytes,UA: NEGATIVE
Nitrite, UA: NEGATIVE
Protein,UA: NEGATIVE
RBC, UA: NEGATIVE
Specific Gravity, UA: 1.025 (ref 1.005–1.030)
Urobilinogen, Ur: 0.2 mg/dL (ref 0.2–1.0)
pH, UA: 6 (ref 5.0–7.5)

## 2020-01-30 LAB — MICROSCOPIC EXAMINATION
Epithelial Cells (non renal): >10 /hpf — AB (ref 0–10)
RBC, Urine: NONE SEEN /hpf (ref 0–2)
Renal Epithel, UA: NONE SEEN /hpf

## 2020-01-30 LAB — WET PREP FOR TRICH, YEAST, CLUE
Clue Cell Exam: NEGATIVE
Trichomonas Exam: NEGATIVE
Yeast Exam: NEGATIVE

## 2020-01-30 LAB — BAYER DCA HB A1C WAIVED: HB A1C (BAYER DCA - WAIVED): 6.4 % (ref <7.0)

## 2020-01-30 MED ORDER — LINACLOTIDE 72 MCG PO CAPS: 72.0000 ug | ORAL_CAPSULE | Freq: Every day | ORAL | 3 refills | Status: DC

## 2020-01-30 MED ORDER — METFORMIN HCL ER 500 MG PO TB24: 1000.0000 mg | ORAL_TABLET | Freq: Every day | ORAL | 1 refills | Status: DC

## 2020-01-30 NOTE — Progress Notes (Signed)
Subjective:  Patient ID: Heidi Landry, female    DOB: 28-Dec-1986, 34 y.o.   MRN: [id number]  Patient Care Team: Baruch Gouty, FNP as PCP - General (Family Medicine) Harl Bowie Alphonse Guild, MD as PCP - Cardiology (Cardiology) Gala Romney Cristopher Estimable, MD as Consulting Physician (Gastroenterology)   Chief Complaint:  Medical Management of Chronic Issues (3 mo ), Diabetes, and Hyperlipidemia   HPI: Heidi Landry is a 34 y.o. female presenting on 01/30/2020 for Medical Management of Chronic Issues (3 mo ), Diabetes, and Hyperlipidemia   1. Type 2 diabetes mellitus without complication, without long-term current use of insulin (HCC) Does not check blood sugars at home. Is due for eye exam and will schedule. Compliant with medications without associated side effects. No polyuria, polydipsia, or polyphagia. Has changed diet and is trying to exercise more. She reports she is eating more fruits and vegetables.   2. Hypertension associated with type 2 diabetes mellitus ([place]) Has been taking medications as prescribed without associated side effects. No chest pain, shortness of breath, leg swelling, confusion, weakness, or fatigue. Has been having bandlike headaches over the last few weeks.   3. GERD without esophagitis Compliant with medications - Yes Current medications - omeprazole Adverse side effects - No Cough - No Sore throat - No Voice change - No Hemoptysis - No Dysphagia or dyspepsia - No Water brash - No Red Flags (weight loss, hematochezia, melena, weight loss, early satiety, fevers, odynophagia, or persistent vomiting) - No   4. Hyperlipidemia associated with type 2 diabetes mellitus ([place]) Compliant with medications - Yes Current medications - atorvastatin Side effects from medications - No Diet - trying to do better, has started eating more fruits and vegetables Exercise - not on a regular basis  Lab Results  Component Value Date   CHOL 131 10/28/2019   HDL 38 (L) 10/28/2019     LDLCALC 70 10/28/2019   TRIG 131 10/28/2019   CHOLHDL 3.4 10/28/2019     Family and personal medical history reviewed. Smoking and ETOH history reviewed.    5. OSA (obstructive sleep apnea) Does wear CPAP at night. States her sleep medicine doctor retired and she needs a referral to a new one. Tolerating CPAP well and using on a nightly basis.   6. Depression, recurrent ([place]) States she has been doing great since increasing her zoloft to 200 mg daily. States she has stopped caring for her friend and started taking better care of herself and her family. States this has helped greatly. No adverse effects from the medications.  Depression screen [facility] 2/9 10/28/2019 08/11/2019 03/07/2019 02/16/2019 12/06/2018  Decreased Interest 0 0 0 0 0  Down, Depressed, Hopeless 0 0 0 0 0  PHQ - 2 Score 0 0 0 0 0  Altered sleeping - 1 - - -  Tired, decreased energy - 1 - - -  Change in appetite - 0 - - -  Feeling bad or failure about yourself  - 0 - - -  Trouble concentrating - 0 - - -  Moving slowly or fidgety/restless - 0 - - -  Suicidal thoughts - 0 - - -  PHQ-9 Score - 2 - - -  Difficult doing work/chores - - - - -  Some recent data might be hidden     7. Tension headache Pt repots a bandlike headache that starts in her forehead and wraps around to the back of her head. States it is tight in nature. States worse when  she is stressed or has not slept well. States she has been taking tylenol with some relief of the pain. No photophobia or phonophobia. Some nausea, no vomiting.   8. Irritable bowel syndrome with both constipation and diarrhea Pt states she continues to have abdominal cramping with constipation and some diarrhea. More constipation at present. States she has not mentioned this to GI but will at her upcoming visit. She has tried Bentyl in the past without relief of the cramping. Has not tried other therapies.   9. Vaginal irritation Pt reports vaginal irritation with malodorous  discharge. Denies new sexual partner. No recent antibiotic use. States she has pruritis. No known STI exposures. States the discharge is white in color.      Relevant past medical, surgical, family, and social history reviewed and updated as indicated.  Allergies and medications reviewed and updated. Date reviewed: Chart in Epic.   Past Medical History:  Diagnosis Date  . Adrenal adenoma    right  . Anxiety   . Atrial fibrillation ([place])   . Carpal tunnel syndrome, bilateral   . Chest pain   . Chronic back pain   . Degenerative disc disease   . Edema   . Gestational diabetes mellitus, antepartum   . Gestational diabetes mellitus, class A2/B 08/08/2015   Early 2hr: 84/186/117 @ 12wks   A1/B  Notified & dietician referral ordered 08/08/15   . Hypertension   . IBS (irritable bowel syndrome)   . Panic attacks   . Placental abruption in third trimester 12/07/2015  . Post partum depression   . Sleep apnea   . Spondylosis   . Tendonitis   . Thoracic spine fracture ([place]) 04/13/2012  . Varicose veins of left lower extremity with complications 95/28/4132    Past Surgical History:  Procedure Laterality Date  . CESAREAN SECTION N/A 12/08/2015   Procedure: CESAREAN SECTION;  Surgeon: Jonnie Kind, MD;  Location: [facility];  Service: Obstetrics;  Laterality: N/A;  . CHOLECYSTECTOMY    . KNEE SURGERY Right 01/29/2019   at [facility] in [place], [place]; s/p fall and deel leg laceration    Social History   Socioeconomic History  . Marital status: Married    Spouse name: Not on file  . Number of children: Not on file  . Years of education: 54  . Highest education level: Not on file  Occupational History    Employer: [employer]  Tobacco Use  . Smoking status: Current Every Day Smoker    Packs/day: 0.50    Years: 9.00    Pack years: 4.50    Types: Cigarettes    Start date: 05/25/2006  . Smokeless tobacco: Never Used  Substance and Sexual Activity  .  Alcohol use: Not Currently    Alcohol/week: 0.0 standard drinks    Comment: rare  . Drug use: No  . Sexual activity: Yes    Birth control/protection: Pill  Other Topics Concern  . Not on file  Social History Narrative  . Not on file   Social Determinants of Health   Financial Resource Strain:   . Difficulty of Paying Living Expenses: Not on file  Food Insecurity:   . Worried About Charity fundraiser in the Last Year: Not on file  . Ran Out of Food in the Last Year: Not on file  Transportation Needs:   . Lack of Transportation (Medical): Not on file  . Lack of Transportation (Non-Medical): Not on file  Physical Activity:   . Days of Exercise per Week: Not on file  . Minutes of Exercise per Session: Not on file  Stress:   . Feeling of Stress : Not on file  Social Connections:   . Frequency of Communication with Friends and Family: Not on file  . Frequency of Social Gatherings with Friends and Family: Not on file  . Attends Religious Services: Not on file  . Active Member of Clubs or Organizations: Not on file  . Attends Archivist Meetings: Not on file  . Marital Status: Not on file  Intimate Partner Violence:   . Fear of Current or Ex-Partner: Not on file  . Emotionally Abused: Not on file  . Physically Abused: Not on file  . Sexually Abused: Not on file    Outpatient Encounter Medications as of 01/30/2020  Medication Sig  . acetaminophen-codeine (TYLENOL #3) 300-30 MG tablet Take 2 tablets by mouth every 4 (four) hours as needed for moderate pain.  Marland Kitchen albuterol (PROVENTIL HFA;VENTOLIN HFA) 108 (90 Base) MCG/ACT inhaler Inhale 2 puffs into the lungs every 6 (six) hours as needed for wheezing or shortness of breath.  Marland Kitchen atorvastatin (LIPITOR) 20 MG tablet Take 1 tablet (20 mg total) by mouth daily.  . bevacizumab (AVASTIN) 400 MG/16ML SOLN Inject into the vein once. Every 8 weeks injected into right eye.  Marland Kitchen buPROPion (WELLBUTRIN SR) 150 MG 12 hr tablet Take 1 tablet  by mouth twice daily  . cyclobenzaprine (FLEXERIL) 10 MG tablet Take 1 tablet by mouth three times daily as needed for muscle spasm  . EQ ALLERGY RELIEF, CETIRIZINE, 10 MG tablet Take 1 tablet by mouth once daily  . Exenatide ER (BYDUREON) 2 MG PEN INJECT 2MG SUBCUTANEOUSLY ONCE A WEEK  . fluticasone (FLONASE) 50 MCG/ACT nasal spray Place 2 sprays into both nostrils daily.  . hydrochlorothiazide (HYDRODIURIL) 12.5 MG tablet Take 1 tablet by mouth once daily  . metFORMIN (GLUCOPHAGE-XR) 500 MG 24 hr tablet Take 2 tablets (1,000 mg total) by mouth daily.  . metoprolol succinate (TOPROL-XL) 25 MG 24 hr tablet TAKE 1 TABLET BY MOUTH ONCE DAILY WITH MEAL OR  IMMEDIATELY  FOLLOWING  . Multiple Vitamin (MULTIVITAMIN WITH MINERALS) TABS tablet Take 1 tablet by mouth daily. womens  . norethindrone (ORTHO MICRONOR) 0.35 MG tablet Take 1 tablet (0.35 mg total) by mouth daily.  Marland Kitchen omeprazole (PRILOSEC) 40 MG capsule Take 1 capsule (40 mg total) by mouth daily.  . podofilox (CONDYLOX) 0.5 % gel Apply topically 2 (two) times daily. (Patient taking differently: Apply topically as needed. )  . senna-docusate (SENOKOT-S) 8.6-50 MG tablet Take 1 tablet by mouth as needed.   . sertraline (ZOLOFT) 100 MG tablet Take 2 tablets (200 mg total) by mouth daily.  Marland Kitchen spironolactone (ALDACTONE) 100 MG tablet Take 1 tablet (100 mg total) by mouth daily.  . [DISCONTINUED] metFORMIN (GLUCOPHAGE-XR) 500 MG 24 hr tablet TAKE 2 TABLETS BY MOUTH TWICE DAILY (Patient taking differently: Take 1,000 mg by mouth daily. )  . Butoconazole Nitrate, 1 Dose, 2 % CREA Place 5 g vaginally at bedtime. (Patient not taking: Reported on 01/30/2020)  . linaclotide (LINZESS) 72 MCG capsule Take 1 capsule (72 mcg total) by mouth daily before breakfast.  . rOPINIRole (REQUIP) 0.5 MG tablet Take 0.5 mg by mouth at bedtime.  . [DISCONTINUED] dicyclomine (BENTYL) 20 MG tablet Take 1 tablet (20 mg total) by mouth 4 (four) times daily as needed for spasms.    . [DISCONTINUED] fluconazole (  DIFLUCAN) 150 MG tablet Take 1 tablet (150 mg total) by mouth every three (3) days as needed.   No facility-administered encounter medications on file as of 01/30/2020.    No Known Allergies  Review of Systems  Constitutional: Negative for activity change, appetite change, chills, diaphoresis, fatigue, fever and unexpected weight change.  HENT: Negative.   Eyes: Negative.  Negative for photophobia and visual disturbance.  Respiratory: Negative for cough, chest tightness and shortness of breath.   Cardiovascular: Negative for chest pain, palpitations and leg swelling.  Gastrointestinal: Positive for abdominal distention, abdominal pain, constipation, diarrhea and nausea. Negative for anal bleeding, blood in stool, rectal pain and vomiting.  Endocrine: Negative.  Negative for cold intolerance, heat intolerance, polydipsia, polyphagia and polyuria.  Genitourinary: Positive for vaginal discharge and vaginal pain. Negative for decreased urine volume, difficulty urinating, dyspareunia, dysuria, enuresis, flank pain, frequency, genital sores, hematuria, menstrual problem, pelvic pain, urgency and vaginal bleeding.  Musculoskeletal: Negative for arthralgias and myalgias.  Skin: Negative.  Negative for color change and rash.  Allergic/Immunologic: Negative.   Neurological: Positive for headaches. Negative for dizziness, tremors, seizures, syncope, facial asymmetry, speech difficulty, weakness, light-headedness and numbness.  Hematological: Negative.   Psychiatric/Behavioral: Negative for confusion, hallucinations, sleep disturbance and suicidal ideas.  All other systems reviewed and are negative.       Objective:  BP 109/66   Pulse 93   Temp 98.9 F (37.2 C)   Resp 20   Ht 5' 2"  (1.575 m)   Wt (!) 324 lb (147 kg)   LMP 01/13/2020 (Approximate)   SpO2 97%   BMI 59.26 kg/m    Wt Readings from Last 3 Encounters:  01/30/20 (!) 324 lb (147 kg)  01/27/20 (!)  324 lb 6.4 oz (147.1 kg)  12/07/19 (!) 325 lb 9.6 oz (147.7 kg)    Physical Exam Vitals and nursing note reviewed.  Constitutional:      General: She is not in acute distress.    Appearance: Normal appearance. She is well-developed and well-groomed. She is morbidly obese. She is not ill-appearing, toxic-appearing or diaphoretic.  HENT:     Head: Normocephalic and atraumatic.     Jaw: There is normal jaw occlusion.     Right Ear: Hearing normal.     Left Ear: Hearing normal.     Nose: Nose normal.     Mouth/Throat:     Lips: Pink.     Mouth: Mucous membranes are moist.     Pharynx: Oropharynx is clear. Uvula midline.  Eyes:     General: Lids are normal.     Extraocular Movements: Extraocular movements intact.     Conjunctiva/sclera: Conjunctivae normal.     Pupils: Pupils are equal, round, and reactive to light.  Neck:     Thyroid: No thyroid mass, thyromegaly or thyroid tenderness.     Vascular: No carotid bruit or JVD.     Trachea: Trachea and phonation normal.  Cardiovascular:     Rate and Rhythm: Normal rate and regular rhythm.     Chest Wall: PMI is not displaced.     Pulses: Normal pulses.     Heart sounds: Normal heart sounds. No murmur. No friction rub. No gallop.   Pulmonary:     Effort: Pulmonary effort is normal. No respiratory distress.     Breath sounds: Normal breath sounds. No wheezing.  Abdominal:     General: Bowel sounds are normal. There is no distension or abdominal bruit.     Palpations:  Abdomen is soft. There is no hepatomegaly or splenomegaly.     Tenderness: There is no abdominal tenderness. There is no right CVA tenderness or left CVA tenderness.     Hernia: No hernia is present.  Genitourinary:    Comments: Deferred, pt wanted to do self wet prep Musculoskeletal:        General: Normal range of motion.     Cervical back: Normal range of motion and neck supple.     Right lower leg: No edema.     Left lower leg: No edema.  Lymphadenopathy:      Cervical: No cervical adenopathy.  Skin:    General: Skin is warm and dry.     Capillary Refill: Capillary refill takes less than 2 seconds.     Coloration: Skin is not cyanotic, jaundiced or pale.     Findings: No rash.          Comments: Large scar from previous trauma  Neurological:     General: No focal deficit present.     Mental Status: She is alert and oriented to person, place, and time.     Cranial Nerves: Cranial nerves are intact. No cranial nerve deficit.     Sensory: Sensation is intact. No sensory deficit.     Motor: Motor function is intact. No weakness.     Coordination: Coordination is intact. Coordination normal.     Gait: Gait is intact. Gait normal.     Deep Tendon Reflexes: Reflexes are normal and symmetric. Reflexes normal.  Psychiatric:        Attention and Perception: Attention and perception normal.        Mood and Affect: Mood and affect normal.        Speech: Speech normal.        Behavior: Behavior normal. Behavior is cooperative.        Thought Content: Thought content normal.        Cognition and Memory: Cognition and memory normal.        Judgment: Judgment normal.     Results for orders placed or performed in visit on 10/28/19  CMP14+EGFR  Result Value Ref Range   Glucose 97 65 - 99 mg/dL   BUN 10 6 - 20 mg/dL   Creatinine, Ser 0.72 0.57 - 1.00 mg/dL   GFR calc non Af Amer 110 >59 mL/min/1.73   GFR calc Af Amer 126 >59 mL/min/1.73   BUN/Creatinine Ratio 14 9 - 23   Sodium 141 134 - 144 mmol/L   Potassium 4.3 3.5 - 5.2 mmol/L   Chloride 98 96 - 106 mmol/L   CO2 28 20 - 29 mmol/L   Calcium 9.2 8.7 - 10.2 mg/dL   Total Protein 6.9 6.0 - 8.5 g/dL   Albumin 4.3 3.8 - 4.8 g/dL   Globulin, Total 2.6 1.5 - 4.5 g/dL   Albumin/Globulin Ratio 1.7 1.2 - 2.2   Bilirubin Total 0.5 0.0 - 1.2 mg/dL   Alkaline Phosphatase 134 (H) 39 - 117 IU/L   AST 32 0 - 40 IU/L   ALT 39 (H) 0 - 32 IU/L  CBC with Differential  Result Value Ref Range   WBC 12.0 (H)  3.4 - 10.8 x10E3/uL   RBC 4.99 3.77 - 5.28 x10E6/uL   Hemoglobin 12.8 11.1 - 15.9 g/dL   Hematocrit 39.9 34.0 - 46.6 %   MCV 80 79 - 97 fL   MCH 25.7 (L) 26.6 - 33.0 pg   MCHC 32.1 31.5 -  35.7 g/dL   RDW 15.0 11.7 - 15.4 %   Platelets 224 150 - 450 x10E3/uL   Neutrophils 62 Not Estab. %   Lymphs 29 Not Estab. %   Monocytes 5 Not Estab. %   Eos 2 Not Estab. %   Basos 1 Not Estab. %   Neutrophils Absolute 7.6 (H) 1.4 - 7.0 x10E3/uL   Lymphocytes Absolute 3.5 (H) 0.7 - 3.1 x10E3/uL   Monocytes Absolute 0.6 0.1 - 0.9 x10E3/uL   EOS (ABSOLUTE) 0.2 0.0 - 0.4 x10E3/uL   Basophils Absolute 0.1 0.0 - 0.2 x10E3/uL   Immature Granulocytes 1 Not Estab. %   Immature Grans (Abs) 0.1 0.0 - 0.1 x10E3/uL  Lipid Panel  Result Value Ref Range   Cholesterol, Total 131 100 - 199 mg/dL   Triglycerides 131 0 - 149 mg/dL   HDL 38 (L) >39 mg/dL   VLDL Cholesterol Cal 23 5 - 40 mg/dL   LDL Chol Calc (NIH) 70 0 - 99 mg/dL   Chol/HDL Ratio 3.4 0.0 - 4.4 ratio  Thyroid Panel With TSH  Result Value Ref Range   TSH 1.570 0.450 - 4.500 uIU/mL   T4, Total 5.9 4.5 - 12.0 ug/dL   T3 Uptake Ratio 28 24 - 39 %   Free Thyroxine Index 1.7 1.2 - 4.9  Bayer DCA Hb A1c Waived  Result Value Ref Range   HB A1C (BAYER DCA - WAIVED) 7.0 (H) <7.0 %     Wet prep negative for clue cells, trich, or yeast.  Urinalysis negative for acute cystitis.   Pertinent labs & imaging results that were available during my care of the patient were reviewed by me and considered in my medical decision making.  Assessment & Plan:  Stepahnie was seen today for medical management of chronic issues, diabetes and hyperlipidemia.  Diagnoses and all orders for this visit:  Type 2 diabetes mellitus without complication, without long-term current use of insulin (HCC) A1C 6.2 in office today. No changes in therapy. Continue medications as prescribed along with diet and exercise. Follow up in 3 months.  -     Bayer DCA Hb A1c Waived -      metFORMIN (GLUCOPHAGE-XR) 500 MG 24 hr tablet; Take 2 tablets (1,000 mg total) by mouth daily.  Hypertension associated with type 2 diabetes mellitus (HCC) BP well controlled. Changes were not made in regimen. Goal BP is 130/80. Pt aware to report any persistent high or low readings. DASH diet and exercise encouraged. Exercise at least 150 minutes per week and increase as tolerated. Goal BMI > 25. Stress management encouraged. Avoid nicotine and tobacco product use. Avoid excessive alcohol and NSAID's. Avoid more than 2000 mg of sodium daily. Medications as prescribed. Follow up as scheduled.  -     CBC with Differential/Platelet -     CMP14+EGFR  GERD without esophagitis No red flags present. Diet discussed. Avoid fried, spicy, fatty, greasy, and acidic foods. Avoid caffeine, nicotine, and alcohol. Do not eat 2-3 hours before bedtime and stay upright for at least 1-2 hours after eating. Eat small frequent meals. Avoid NSAID's like motrin and aleve. Medications as prescribed. Report any new or worsening symptoms. Follow up as discussed or sooner if needed.   -     CBC with Differential/Platelet  Hyperlipidemia associated with type 2 diabetes mellitus ([place]) Diet encouraged - increase intake of fresh fruits and vegetables, increase intake of lean proteins. Bake, broil, or grill foods. Avoid fried, greasy, and fatty foods. Avoid  fast foods. Increase intake of fiber-rich whole grains. Exercise encouraged - at least 150 minutes per week and advance as tolerated.  Goal BMI < 25. Continue medications as prescribed. Follow up in 3-6 months as discussed.  -     CBC with Differential/Platelet -     Lipid panel  OSA (obstructive sleep apnea) Pts sleep medicine provider has retired and pt would like referral to new provider. Referral placed today.  -     Ambulatory referral to Neurology  Depression, recurrent [facility]) Reports doing well since increasing Zoloft dose. Tolerating well, will continue.   Tension  headache Symptomatic care discussed in detail. Can trial Excedrin tension headache. Pt aware to report any new, worsening, or persistent symptoms.   Irritable bowel syndrome with both constipation and diarrhea Ongoing and worsening symptoms. Will initiate below. Pt aware to keep follow up with GI as scheduled. Report any new, worsening, or persistent symptoms.  -     linaclotide (LINZESS) 72 MCG capsule; Take 1 capsule (72 mcg total) by mouth daily before breakfast.  Vaginal irritation Urinalysis and wet prep negative in office. STI cultures pending. Will initiate treatment if warranted. Vaginal hygiene discussed in detail.  -     Chlamydia/Gonococcus/Trichomonas, NAA -     Urinalysis, Complete -     WET PREP FOR TRICH, YEAST, CLUE     Continue all other maintenance medications.  Follow up plan: Return in about 3 months (around 04/28/2020), or if symptoms worsen or fail to improve, for DM.  Continue healthy lifestyle choices, including diet (rich in fruits, vegetables, and lean proteins, and low in salt and simple carbohydrates) and exercise (at least 30 minutes of moderate physical activity daily).  Educational handout given for DM  The above assessment and management plan was discussed with the patient. The patient verbalized understanding of and has agreed to the management plan. Patient is aware to call the clinic if they develop any new symptoms or if symptoms persist or worsen. Patient is aware when to return to the clinic for a follow-up visit. Patient educated on when it is appropriate to go to the emergency department.   Monia Pouch, FNP-C [facility] [phone]

## 2020-01-30 NOTE — Telephone Encounter (Signed)
Called pt and discussed labs back so far - will call again tomorrow

## 2020-01-30 NOTE — Patient Instructions (Addendum)
Continue to monitor your blood sugars as we discussed and record them. Bring the log to your next appointment.  Take your medications as directed.    Goal Blood glucose:    Fasting (before meals) = 80 to 130   Within 2 hours of eating = less than 180   Understanding your Hemoglobin A1c:     Diabetes Mellitus and Nutrition    I think that you would greatly benefit from seeing a nutritionist. If this is something you are interested in, please call Dr Jenne Campus at [phone] to schedule an appointment.   When you have diabetes (diabetes mellitus), it is very important to have healthy eating habits because your blood sugar (glucose) levels are greatly affected by what you eat and drink. Eating healthy foods in the appropriate amounts, at about the same times every day, can help you:  Control your blood glucose.  Lower your risk of heart disease.  Improve your blood pressure.  Reach or maintain a healthy weight.  Every person with diabetes is different, and each person has different needs for a meal plan. Your health care provider may recommend that you work with a diet and nutrition specialist (dietitian) to make a meal plan that is best for you. Your meal plan may vary depending on factors such as:  The calories you need.  The medicines you take.  Your weight.  Your blood glucose, blood pressure, and cholesterol levels.  Your activity level.  Other health conditions you have, such as heart or kidney disease.  How do carbohydrates affect me? Carbohydrates affect your blood glucose level more than any other type of food. Eating carbohydrates naturally increases the amount of glucose in your blood. Carbohydrate counting is a method for keeping track of how many carbohydrates you eat. Counting carbohydrates is important to keep your blood glucose at a healthy level, especially if you use insulin or take certain oral diabetes medicines. It is important to know how many  carbohydrates you can safely have in each meal. This is different for every person. Your dietitian can help you calculate how many carbohydrates you should have at each meal and for snack. Foods that contain carbohydrates include:  Bread, cereal, rice, pasta, and crackers.  Potatoes and corn.  Peas, beans, and lentils.  Milk and yogurt.  Fruit and juice.  Desserts, such as cakes, cookies, ice cream, and candy.  How does alcohol affect me? Alcohol can cause a sudden decrease in blood glucose (hypoglycemia), especially if you use insulin or take certain oral diabetes medicines. Hypoglycemia can be a life-threatening condition. Symptoms of hypoglycemia (sleepiness, dizziness, and confusion) are similar to symptoms of having too much alcohol. If your health care provider says that alcohol is safe for you, follow these guidelines:  Limit alcohol intake to no more than 1 drink per day for nonpregnant women and 2 drinks per day for men. One drink equals 12 oz of beer, 5 oz of wine, or 1 oz of hard liquor.  Do not drink on an empty stomach.  Keep yourself hydrated with water, diet soda, or unsweetened iced tea.  Keep in mind that regular soda, juice, and other mixers may contain a lot of sugar and must be counted as carbohydrates.  What are tips for following this plan?  Reading food labels  Start by checking the serving size on the label. The amount of calories, carbohydrates, fats, and other nutrients listed on the label are based on one serving of the food. Many foods  contain more than one serving per package.  Check the total grams (g) of carbohydrates in one serving. You can calculate the number of servings of carbohydrates in one serving by dividing the total carbohydrates by 15. For example, if a food has 30 g of total carbohydrates, it would be equal to 2 servings of carbohydrates.  Check the number of grams (g) of saturated and trans fats in one serving. Choose foods that have  low or no amount of these fats.  Check the number of milligrams (mg) of sodium in one serving. Most people should limit total sodium intake to less than 2,300 mg per day.  Always check the nutrition information of foods labeled as "low-fat" or "nonfat". These foods may be higher in added sugar or refined carbohydrates and should be avoided.  Talk to your dietitian to identify your daily goals for nutrients listed on the label.  Shopping  Avoid buying canned, premade, or processed foods. These foods tend to be high in fat, sodium, and added sugar.  Shop around the outside edge of the grocery store. This includes fresh fruits and vegetables, bulk grains, fresh meats, and fresh dairy.  Cooking  Use low-heat cooking methods, such as baking, instead of high-heat cooking methods like deep frying.  Cook using healthy oils, such as olive, canola, or sunflower oil.  Avoid cooking with butter, cream, or high-fat meats.  Meal planning  Eat meals and snacks regularly, preferably at the same times every day. Avoid going long periods of time without eating.  Eat foods high in fiber, such as fresh fruits, vegetables, beans, and whole grains. Talk to your dietitian about how many servings of carbohydrates you can eat at each meal.  Eat 4-6 ounces of lean protein each day, such as lean meat, chicken, fish, eggs, or tofu. 1 ounce is equal to 1 ounce of meat, chicken, or fish, 1 egg, or 1/4 cup of tofu.  Eat some foods each day that contain healthy fats, such as avocado, nuts, seeds, and fish.  Lifestyle   Check your blood glucose regularly.  Exercise at least 30 minutes 5 or more days each week, or as told by your health care provider.  Take medicines as told by your health care provider.  Do not use any products that contain nicotine or tobacco, such as cigarettes and e-cigarettes. If you need help quitting, ask your health care provider.  Work with a Social worker or diabetes educator to  identify strategies to manage stress and any emotional and social challenges.   What are some questions to ask my health care provider?  Do I need to meet with a diabetes educator?  Do I need to meet with a dietitian?  What number can I call if I have questions?  When are the best times to check my blood glucose?   Where to find more information:  American Diabetes Association: diabetes.org/food-and-fitness/food  Academy of Nutrition and Dietetics: PokerClues.dk  [employer] of Diabetes and Digestive and Kidney Diseases (NIH): ContactWire.be   Summary  A healthy meal plan will help you control your blood glucose and maintain a healthy lifestyle.  Working with a diet and nutrition specialist (dietitian) can help you make a meal plan that is best for you.  Keep in mind that carbohydrates and alcohol have immediate effects on your blood glucose levels. It is important to count carbohydrates and to use alcohol carefully. This information is not intended to replace advice given to you by your health care provider.  Make sure you discuss any questions you have with your health care provider. Document Released: 09/11/2005 Document Revised: 01/19/2017 Document Reviewed: 01/19/2017 Elsevier Interactive Patient Education  2018 [employer].     Tension Headache, Adult A tension headache is pain, pressure, or aching in your head. Tension headaches can last from 30 minutes to several days. Follow these instructions at home: Managing pain  Take over-the-counter and prescription medicines only as told by your doctor.  When you have a headache, lie down in a dark, quiet room.  If told, put ice on your head and neck: ? Put ice in a plastic bag. ? Place a towel between your skin and the bag. ? Leave the ice on for 20 minutes, 2-3 times a day.  If told,  put heat on the back of your neck. Do this as often as your doctor tells you to. Use the kind of heat that your doctor recommends, such as a moist heat pack or a heating pad. ? Place a towel between your skin and the heat. ? Leave the heat on for 20-30 minutes. ? Remove the heat if your skin turns bright red. Eating and drinking  Eat meals on a regular schedule.  Watch how much alcohol you drink: ? If you are a woman and are not pregnant, do not drink more than 1 drink a day. ? If you are a man, do not drink more than 2 drinks a day.  Drink enough fluid to keep your pee (urine) pale yellow.  Do not use a lot of caffeine, or stop using caffeine. Lifestyle  Get enough sleep. Get 7-9 hours of sleep each night. Or get the amount of sleep that your doctor tells you to.  At bedtime, remove all electronic devices from your room. Examples of electronic devices are computers, phones, and tablets.  Find ways to lessen your stress. Some things that can lessen stress are: ? Exercise. ? Deep breathing. ? Yoga. ? Music. ? Positive thoughts.  Sit up straight. Do not tighten (tense) your muscles.  Do not use any products that have nicotine or tobacco in them, such as cigarettes and e-cigarettes. If you need help quitting, ask your doctor. General instructions   Keep all follow-up visits as told by your doctor. This is important.  Avoid things that can bring on headaches. Keep a journal to find out if certain things bring on headaches. For example, write down: ? What you eat and drink. ? How much sleep you get. ? Any change to your diet or medicines. Contact a doctor if:  Your headache does not get better.  Your headache comes back.  You have a headache and sounds, light, or smells bother you.  You feel sick to your stomach (nauseous) or you throw up (vomit).  Your stomach hurts. Get help right away if:  You suddenly get a very bad headache along with any of these: ? A stiff  neck. ? Feeling sick to your stomach. ? Throwing up. ? Feeling weak. ? Trouble seeing. ? Feeling short of breath. ? A rash. ? Feeling unusually sleepy. ? Trouble speaking. ? Pain in your eye or ear. ? Trouble walking or balancing. ? Feeling like you will pass out (faint). ? Passing out. Summary  A tension headache is pain, pressure, or aching in your head.  Tension headaches can last from 30 minutes to several days.  Lifestyle changes and medicines may help relieve pain. This information is not intended to replace advice given  to you by your health care provider. Make sure you discuss any questions you have with your health care provider. Document Revised: 10/12/2019 Document Reviewed: 03/27/2017 Elsevier Patient Education  [place].

## 2020-01-31 LAB — HEPATIC FUNCTION PANEL
ALT: 24 IU/L (ref 0–32)
AST: 18 IU/L (ref 0–40)
Albumin: 4.5 g/dL (ref 3.8–4.8)
Alkaline Phosphatase: 126 IU/L — ABNORMAL HIGH (ref 39–117)
Bilirubin Total: 0.4 mg/dL (ref 0.0–1.2)
Bilirubin, Direct: 0.11 mg/dL (ref 0.00–0.40)
Total Protein: 7.2 g/dL (ref 6.0–8.5)

## 2020-01-31 LAB — LIPID PANEL
Chol/HDL Ratio: 3.6 ratio (ref 0.0–4.4)
Cholesterol, Total: 130 mg/dL (ref 100–199)
HDL: 36 mg/dL — ABNORMAL LOW (ref >39)
LDL Chol Calc (NIH): 59 mg/dL (ref 0–99)
Triglycerides: 214 mg/dL — ABNORMAL HIGH (ref 0–149)
VLDL Cholesterol Cal: 35 mg/dL (ref 5–40)

## 2020-01-31 LAB — CBC WITH DIFFERENTIAL/PLATELET
Basophils Absolute: 0.1 10*3/uL (ref 0.0–0.2)
Basos: 1 %
EOS (ABSOLUTE): 0.2 10*3/uL (ref 0.0–0.4)
Eos: 1 %
Hematocrit: 40.9 % (ref 34.0–46.6)
Hemoglobin: 12.8 g/dL (ref 11.1–15.9)
Immature Grans (Abs): 0.1 10*3/uL (ref 0.0–0.1)
Immature Granulocytes: 1 %
Lymphocytes Absolute: 3.1 10*3/uL (ref 0.7–3.1)
Lymphs: 22 %
MCH: 25.2 pg — ABNORMAL LOW (ref 26.6–33.0)
MCHC: 31.3 g/dL — ABNORMAL LOW (ref 31.5–35.7)
MCV: 81 fL (ref 79–97)
Monocytes Absolute: 0.7 10*3/uL (ref 0.1–0.9)
Monocytes: 5 %
Neutrophils Absolute: 9.8 10*3/uL — ABNORMAL HIGH (ref 1.4–7.0)
Neutrophils: 70 %
Platelets: 221 10*3/uL (ref 150–450)
RBC: 5.07 x10E6/uL (ref 3.77–5.28)
RDW: 15 % (ref 11.7–15.4)
WBC: 14 10*3/uL — ABNORMAL HIGH (ref 3.4–10.8)

## 2020-01-31 LAB — CMP14+EGFR
ALT: 26 IU/L (ref 0–32)
AST: 17 IU/L (ref 0–40)
Albumin/Globulin Ratio: 1.6 (ref 1.2–2.2)
Albumin: 4.3 g/dL (ref 3.8–4.8)
Alkaline Phosphatase: 122 IU/L — ABNORMAL HIGH (ref 39–117)
BUN/Creatinine Ratio: 19 (ref 9–23)
BUN: 14 mg/dL (ref 6–20)
Bilirubin Total: 0.4 mg/dL (ref 0.0–1.2)
CO2: 27 mmol/L (ref 20–29)
Calcium: 9.1 mg/dL (ref 8.7–10.2)
Chloride: 97 mmol/L (ref 96–106)
Creatinine, Ser: 0.72 mg/dL (ref 0.57–1.00)
GFR calc Af Amer: 126 mL/min/{1.73_m2} (ref >59)
GFR calc non Af Amer: 110 mL/min/{1.73_m2} (ref >59)
Globulin, Total: 2.7 g/dL (ref 1.5–4.5)
Glucose: 82 mg/dL (ref 65–99)
Potassium: 4.6 mmol/L (ref 3.5–5.2)
Sodium: 141 mmol/L (ref 134–144)
Total Protein: 7 g/dL (ref 6.0–8.5)

## 2020-02-01 ENCOUNTER — Encounter: Payer: Self-pay | Admitting: Family Medicine

## 2020-02-01 LAB — CHLAMYDIA/GONOCOCCUS/TRICHOMONAS, NAA
Chlamydia by NAA: NEGATIVE
Gonococcus by NAA: NEGATIVE
Trich vag by NAA: NEGATIVE

## 2020-02-08 ENCOUNTER — Other Ambulatory Visit: Payer: Self-pay

## 2020-02-08 ENCOUNTER — Other Ambulatory Visit: Payer: Medicaid Other

## 2020-02-08 DIAGNOSIS — N898 Other specified noninflammatory disorders of vagina: Secondary | ICD-10-CM

## 2020-02-08 DIAGNOSIS — D72829 Elevated white blood cell count, unspecified: Secondary | ICD-10-CM

## 2020-02-08 DIAGNOSIS — R7989 Other specified abnormal findings of blood chemistry: Secondary | ICD-10-CM | POA: Diagnosis not present

## 2020-02-08 LAB — WET PREP FOR TRICH, YEAST, CLUE
Clue Cell Exam: NEGATIVE
Trichomonas Exam: NEGATIVE
Yeast Exam: NEGATIVE

## 2020-02-08 NOTE — Addendum Note (Signed)
Addended by: Zannie Cove on: 02/08/2020 12:48 PM   Modules accepted: Orders

## 2020-02-09 ENCOUNTER — Ambulatory Visit: Payer: Medicaid Other | Admitting: Gastroenterology

## 2020-02-09 ENCOUNTER — Encounter: Payer: Self-pay | Admitting: Family Medicine

## 2020-02-09 ENCOUNTER — Ambulatory Visit: Payer: Medicaid Other | Admitting: Nurse Practitioner

## 2020-02-09 LAB — CBC WITH DIFFERENTIAL/PLATELET
Basophils Absolute: 0.1 10*3/uL (ref 0.0–0.2)
Basos: 0 %
EOS (ABSOLUTE): 0.2 10*3/uL (ref 0.0–0.4)
Eos: 1 %
Hematocrit: 39.4 % (ref 34.0–46.6)
Hemoglobin: 12.4 g/dL (ref 11.1–15.9)
Immature Grans (Abs): 0.1 10*3/uL (ref 0.0–0.1)
Immature Granulocytes: 1 %
Lymphocytes Absolute: 2.7 10*3/uL (ref 0.7–3.1)
Lymphs: 20 %
MCH: 24.9 pg — ABNORMAL LOW (ref 26.6–33.0)
MCHC: 31.5 g/dL (ref 31.5–35.7)
MCV: 79 fL (ref 79–97)
Monocytes Absolute: 0.7 10*3/uL (ref 0.1–0.9)
Monocytes: 5 %
Neutrophils Absolute: 9.9 10*3/uL — ABNORMAL HIGH (ref 1.4–7.0)
Neutrophils: 73 %
Platelets: 202 10*3/uL (ref 150–450)
RBC: 4.98 x10E6/uL (ref 3.77–5.28)
RDW: 15 % (ref 11.7–15.4)
WBC: 13.5 10*3/uL — ABNORMAL HIGH (ref 3.4–10.8)

## 2020-02-09 LAB — HEPATIC FUNCTION PANEL
ALT: 22 IU/L (ref 0–32)
AST: 20 IU/L (ref 0–40)
Albumin: 4.5 g/dL (ref 3.8–4.8)
Alkaline Phosphatase: 122 IU/L — ABNORMAL HIGH (ref 39–117)
Bilirubin Total: 0.4 mg/dL (ref 0.0–1.2)
Bilirubin, Direct: 0.11 mg/dL (ref 0.00–0.40)
Total Protein: 7.1 g/dL (ref 6.0–8.5)

## 2020-02-09 NOTE — Progress Notes (Signed)
Patient aware.

## 2020-02-13 ENCOUNTER — Encounter: Payer: Self-pay | Admitting: Neurology

## 2020-02-13 ENCOUNTER — Ambulatory Visit: Payer: Medicaid Other | Admitting: Neurology

## 2020-02-13 ENCOUNTER — Other Ambulatory Visit: Payer: Self-pay

## 2020-02-13 VITALS — BP 132/78 | HR 96 | Temp 97.6°F | Ht 62.0 in | Wt 320.0 lb

## 2020-02-13 DIAGNOSIS — G4733 Obstructive sleep apnea (adult) (pediatric): Secondary | ICD-10-CM | POA: Diagnosis not present

## 2020-02-13 DIAGNOSIS — Z6841 Body Mass Index (BMI) 40.0 and over, adult: Secondary | ICD-10-CM

## 2020-02-13 DIAGNOSIS — Z9989 Dependence on other enabling machines and devices: Secondary | ICD-10-CM | POA: Diagnosis not present

## 2020-02-13 NOTE — Patient Instructions (Signed)
Please continue using your CPAP regularly. While your insurance requires that you use CPAP at least 4 hours each night on 70% of the nights, I recommend, that you not skip any nights and use it throughout the night if you can. Getting used to CPAP and staying with the treatment long term does take time and patience and discipline. Untreated obstructive sleep apnea when it is moderate to severe can have an adverse impact on cardiovascular health and raise her risk for heart disease, arrhythmias, hypertension, congestive heart failure, stroke and diabetes. Untreated obstructive sleep apnea causes sleep disruption, nonrestorative sleep, and sleep deprivation. This can have an impact on your day to day functioning and cause daytime sleepiness and impairment of cognitive function, memory loss, mood disturbance, and problems focussing. Using CPAP regularly can improve these symptoms.  Please talk to your DME provider, [facility], [facility] about your compliance data, I would like to get access to your machines data, they can grant access or fax [place] the report about your usage of the machine, I believe you may have [employer] machine. You can follow-up routinely in 1 year if your compliance looks good.  We will call you if you need to come in sooner than that.  Your machine should be eligible for renewal after at least 5 years, you may be eligible for a new next year or end of this year.

## 2020-02-13 NOTE — Progress Notes (Signed)
Subjective:    Patient ID: Heidi Landry is a 34 y.o. female.  HPI     Star Age, MD, PhD [facility] [address].O. [place], [place] [address]  Dear Heidi Landry,   I saw your patient, Heidi Landry, upon your kind request in my sleep clinic today for initial consultation of her sleep disorder, in particular, her diagnosis of obstructive sleep apnea.  The patient is unaccompanied today.  As you know, [name] is a 34 year old right-handed woman with an underlying medical history of irritable bowel syndrome, hypertension, diabetes, chronic back pain, history of thoracic spine fracture, history of A. fib, anxiety, adrenal adenoma, edema, carpal tunnel syndrome, varicose veins and morbid obesity with a BMI of over 50, who was diagnosed with obstructive sleep apnea in 2016.  She has been on CPAP therapy.  She brought her machine today but did not bring the power cord and we do not have a matching power cord unfortunately.  A CPAP compliance download was not possible.  Her sleep specialist, Dr. Sinda Du retired.  She had sleep study testing on 11/24/2015.  She was pregnant at the time and had difficulty tolerating CPAP.  She had a sleep study on 12/28/2015 which showed a total AHI of 50.5/h.  Oxygen desaturation nadir was 74%.  Trial of AutoPap was recommended at the time.  She lives with her husband and 2 sons, ages 28 and 76. She currently does not work, she smokes 1 pack/day, she does not utilize any alcohol, drinks caffeine in the form of coffee, 1 to 3 cups daily and occasional diet soda. She reports compliance with her machine, she feels that she cannot sleep without it.  She had significant improvement of her sleep quality once she got on the machine.  She needs new supplies.  She particularly noticed an improvement in her nocturia and enuresis when she started using her machine. Her weight has been fluctuating, she has lost some weight over the past  year.  She is motivated to continue to work on weight loss.  She has quit smoking in the past, she plans to quit smoking again. Bedtime is currently between 8 and 10 PM, rise time around 6 or earlier.   Her Past Medical History Is Significant For: Past Medical History:  Diagnosis Date  . Adrenal adenoma    right  . Anxiety   . Atrial fibrillation ([place])   . Carpal tunnel syndrome, bilateral   . Chest pain   . Chronic back pain   . Degenerative disc disease   . Edema   . Gestational diabetes mellitus, antepartum   . Gestational diabetes mellitus, class A2/B 08/08/2015   Early 2hr: 84/186/117 @ 12wks   A1/B  Notified & dietician referral ordered 08/08/15   . Hypertension   . IBS (irritable bowel syndrome)   . Panic attacks   . Placental abruption in third trimester 12/07/2015  . Post partum depression   . Sleep apnea   . Spondylosis   . Tendonitis   . Thoracic spine fracture ([place]) 04/13/2012  . Varicose veins of left lower extremity with complications 02/72/5366    Her Past Surgical History Is Significant For: Past Surgical History:  Procedure Laterality Date  . CESAREAN SECTION N/A 12/08/2015   Procedure: CESAREAN SECTION;  Surgeon: Jonnie Kind, MD;  Location: [facility];  Service: Obstetrics;  Laterality: N/A;  . CHOLECYSTECTOMY    . KNEE SURGERY Right 01/29/2019   at [facility] in [place], [place]; s/p fall and deel leg laceration    Her Family History Is Significant For: Family History  Problem Relation Age of Onset  . Fibromyalgia Mother   . Hernia Father   . Lung cancer Father   . Colon cancer Father 12       partial colectomy  . Liver disease Father        Fatty liver; ? ETOH-related  . Diabetes Maternal Grandmother   . Hypertension Maternal Grandmother   . Cancer Paternal Uncle   . Brain cancer Paternal Uncle   . Throat cancer Paternal Uncle   . Thyroid disease Paternal Aunt     Her Social History Is Significant For: Social History    Socioeconomic History  . Marital status: Married    Spouse name: Not on file  . Number of children: Not on file  . Years of education: 64  . Highest education level: Not on file  Occupational History    Employer: [employer]  Tobacco Use  . Smoking status: Current Every Day Smoker    Packs/day: 0.50    Years: 9.00    Pack years: 4.50    Types: Cigarettes    Start date: 05/25/2006  . Smokeless tobacco: Never Used  Substance and Sexual Activity  . Alcohol use: Not Currently    Alcohol/week: 0.0 standard drinks    Comment: rare  . Drug use: No  . Sexual activity: Yes    Birth control/protection: Pill  Other Topics Concern  . Not on file  Social History Narrative  . Not on file   Social Determinants of Health   Financial Resource Strain:   . Difficulty of Paying Living Expenses: Not on file  Food Insecurity:   . Worried About Charity fundraiser in the Last Year: Not on file  . Ran Out of Food in the Last Year: Not on file  Transportation Needs:   . Lack of Transportation (Medical): Not on file  . Lack of Transportation (Non-Medical): Not on file  Physical Activity:   . Days of Exercise per Week: Not on file  . Minutes of Exercise per Session: Not on file  Stress:   . Feeling of Stress : Not on file  Social Connections:   . Frequency of Communication with Friends and Family: Not on file  . Frequency of Social Gatherings with Friends and Family: Not on file  . Attends Religious Services: Not on file  . Active Member of Clubs or Organizations: Not on file  . Attends Archivist Meetings: Not on file  . Marital Status: Not on file    Her Allergies Are:  No Known Allergies:   Her Current Medications Are:  Outpatient Encounter Medications as of 02/13/2020  Medication Sig  . acetaminophen-codeine (TYLENOL #3) 300-30 MG tablet Take 2 tablets by mouth every 4 (four) hours as needed for moderate pain.  Marland Kitchen albuterol (PROVENTIL HFA;VENTOLIN HFA) 108 (90  Base) MCG/ACT inhaler Inhale 2 puffs into the lungs every 6 (six) hours as needed for wheezing or shortness of breath.  Marland Kitchen atorvastatin (LIPITOR) 20 MG tablet Take 1 tablet (20 mg total) by mouth daily.  Marland Kitchen buPROPion (WELLBUTRIN SR) 150 MG 12 hr tablet Take 1 tablet by mouth twice daily  . cyclobenzaprine (FLEXERIL) 10 MG tablet Take 1 tablet by mouth three times daily as needed for muscle spasm  . EQ ALLERGY RELIEF, CETIRIZINE, 10 MG tablet Take 1 tablet by mouth once daily  .  fluticasone (FLONASE) 50 MCG/ACT nasal spray Place 2 sprays into both nostrils daily.  . hydrochlorothiazide (HYDRODIURIL) 12.5 MG tablet Take 1 tablet by mouth once daily  . linaclotide (LINZESS) 72 MCG capsule Take 1 capsule (72 mcg total) by mouth daily before breakfast.  . metFORMIN (GLUCOPHAGE-XR) 500 MG 24 hr tablet Take 2 tablets (1,000 mg total) by mouth daily.  . metoprolol succinate (TOPROL-XL) 25 MG 24 hr tablet TAKE 1 TABLET BY MOUTH ONCE DAILY WITH MEAL OR  IMMEDIATELY  FOLLOWING  . Multiple Vitamin (MULTIVITAMIN WITH MINERALS) TABS tablet Take 1 tablet by mouth daily. womens  . norethindrone (ORTHO MICRONOR) 0.35 MG tablet Take 1 tablet (0.35 mg total) by mouth daily.  Marland Kitchen omeprazole (PRILOSEC) 40 MG capsule Take 1 capsule (40 mg total) by mouth daily.  . podofilox (CONDYLOX) 0.5 % gel Apply topically 2 (two) times daily. (Patient taking differently: Apply topically as needed. )  . rOPINIRole (REQUIP) 0.5 MG tablet Take 0.5 mg by mouth at bedtime.  . senna-docusate (SENOKOT-S) 8.6-50 MG tablet Take 1 tablet by mouth as needed.   Marland Kitchen spironolactone (ALDACTONE) 100 MG tablet Take 1 tablet (100 mg total) by mouth daily.  . bevacizumab (AVASTIN) 400 MG/16ML SOLN Inject into the vein once. Every 8 weeks injected into right eye.  . Butoconazole Nitrate, 1 Dose, 2 % CREA Place 5 g vaginally at bedtime. (Patient not taking: Reported on 01/30/2020)  . Exenatide ER (BYDUREON) 2 MG PEN INJECT 2MG SUBCUTANEOUSLY ONCE A WEEK  (Patient not taking: Reported on 02/13/2020)  . sertraline (ZOLOFT) 100 MG tablet Take 2 tablets (200 mg total) by mouth daily.   No facility-administered encounter medications on file as of 02/13/2020.  :  Review of Systems:  Out of a complete 14 point review of systems, all are reviewed and negative with the exception of these symptoms as listed below: Review of Systems  Neurological:       Pt here to establish care for CPAP. Pt has sleep study in 2016 and was started on CPAP. Dr. Kathaleen Grinder in [place] was previous MD prescribing supplies, he has recently retired.  Epworth Sleepiness Scale 0= would never doze 1= slight chance of dozing 2= moderate chance of dozing 3= high chance of dozing  Sitting and reading:2 Watching TV:1 Sitting inactive in a public place (ex. Theater or meeting):1 As a passenger in a car for an hour without a break:3 Lying down to rest in the afternoon:1 Sitting and talking to someone:0 Sitting quietly after lunch (no alcohol):0 In a car, while stopped in traffic:0 Total:8    Objective:  Neurological Exam  Physical Exam Physical Examination:   Vitals:   02/13/20 0910  BP: 132/78  Pulse: 96  Temp: 97.6 F (36.4 C)    General Examination: The patient is a very pleasant 34 y.o. female in no acute distress. She appears well-developed and well-nourished and well groomed.   HEENT: Normocephalic, atraumatic, pupils are equal, round and reactive to light, extraocular tracking is good without limitation to gaze excursion or nystagmus noted. Hearing is grossly intact. Face is symmetric with normal facial animation. Speech is clear with no dysarthria noted. There is no hypophonia. There is no lip, neck/head, jaw or voice tremor. Neck is supple with full range of passive and active motion. There are no carotid bruits on auscultation. Oropharynx exam reveals: moderate mouth dryness, adequate dental hygiene and moderate airway crowding, due to Small airway entry,  uvula slightly larger, tonsillar size of about 1-2+ bilaterally, neck circumference  of 19-3/8 inches.  Tongue protrudes centrally and palate elevates symmetrically.  Chest: Clear to auscultation without wheezing, rhonchi or crackles noted.  Heart: S1+S2+0, regular and normal without murmurs, rubs or gallops noted.   Abdomen: Soft, non-tender and non-distended with normal bowel sounds appreciated on auscultation.  Extremities: There is no pitting edema in the distal lower extremities bilaterally.   Skin: Warm and dry without trophic changes noted.   Musculoskeletal: exam reveals Large scar across knee area on the right.  Neurologically:  Mental status: The patient is awake, alert and oriented in all 4 spheres. Her immediate and remote memory, attention, language skills and fund of knowledge are appropriate. There is no evidence of aphasia, agnosia, apraxia or anomia. Speech is clear with normal prosody and enunciation. Thought process is linear. Mood is normal and affect is normal.  Cranial nerves II - XII are as described above under HEENT exam.  Motor exam: Normal bulk, strength and tone is noted. There is no tremor. Fine motor skills and coordination: grossly intact.  Cerebellar testing: No dysmetria or intention tremor. There is no truncal or gait ataxia.  Sensory exam: intact to light touch in the upper and lower extremities.  Gait, station and balance: She stands easily. No veering to one side is noted. No leaning to one side is noted. Posture is age-appropriate and stance is narrow based. Gait shows normal stride length and normal pace. No problems turning are noted.  Assessment and Plan:  In summary, Creedence Heiss is a very pleasant 34 y.o.-year old female with an underlying medical history of irritable bowel syndrome, hypertension, diabetes, chronic back pain, history of thoracic spine fracture, history of A. fib, anxiety, adrenal adenoma, edema, carpal tunnel syndrome, varicose veins  and morbid obesity with a BMI of over 45, who Presents for evaluation of her prior diagnosis of obstructive sleep apnea.  She was diagnosed with severe obstructive sleep apnea in December 2016.  She has been on CPAP or AutoPap.  She reports compliance with her machine. I discussed the diagnosis of severe obstructive sleep apnea with the patient and the importance of continuing treatment for it.  She is motivated to continue with treatment, I wrote for new supplies, we will have her get in touch with her DME company about getting compliance data.  We will email them as well.  If all goes well and she continues to be fully compliant with her CPAP she can be seen yearly.  She may be eligible for a new machine by the end of this year or early next year.  We talked about the importance of weight loss and smoking cessation and maintaining a healthy lifestyle.  She is encouraged to stay well-hydrated with water. I suggested a 1 year checkup with one of our nurse practitioners.  I answered all her questions today and she was in agreement. Thank you very much for allowing me to participate in the care of this nice patient. If I can be of any further assistance to you please do not hesitate to call me at [phone].  Sincerely,   Star Age, MD, PhD

## 2020-02-14 ENCOUNTER — Other Ambulatory Visit: Payer: Self-pay | Admitting: Family Medicine

## 2020-02-14 DIAGNOSIS — M545 Low back pain, unspecified: Secondary | ICD-10-CM

## 2020-02-14 DIAGNOSIS — F339 Major depressive disorder, recurrent, unspecified: Secondary | ICD-10-CM

## 2020-02-14 LAB — ALKALINE PHOSPHATASE, ISOENZYMES
Alkaline Phosphatase: 125 IU/L — ABNORMAL HIGH (ref 39–117)
BONE FRACTION: 23 % (ref 14–68)
INTESTINAL FRAC.: 7 % (ref 0–18)
LIVER FRACTION: 70 % (ref 18–85)

## 2020-02-14 LAB — SPECIMEN STATUS REPORT

## 2020-02-14 LAB — GAMMA GT: GGT: 18 IU/L (ref 0–60)

## 2020-02-20 ENCOUNTER — Encounter: Payer: Self-pay | Admitting: Family Medicine

## 2020-02-20 ENCOUNTER — Ambulatory Visit (INDEPENDENT_AMBULATORY_CARE_PROVIDER_SITE_OTHER): Payer: Medicaid Other | Admitting: Family Medicine

## 2020-02-20 ENCOUNTER — Other Ambulatory Visit: Payer: Self-pay

## 2020-02-20 DIAGNOSIS — J309 Allergic rhinitis, unspecified: Secondary | ICD-10-CM | POA: Diagnosis not present

## 2020-02-20 MED ORDER — PREDNISONE 20 MG PO TABS: ORAL_TABLET | ORAL | 0 refills | Status: DC

## 2020-02-20 NOTE — Progress Notes (Signed)
Virtual Visit via telephone Note  I connected with Heidi Landry on 02/20/20 at 1045 by telephone and verified that I am speaking with the correct person using two identifiers. Heidi Landry is currently located at home and no other people are currently with her during visit. The provider, Fransisca Kaufmann Bryn Saline, MD is located in their office at time of visit.  Call ended at 1052  I discussed the limitations, risks, security and privacy concerns of performing an evaluation and management service by telephone and the availability of in person appointments. I also discussed with the patient that there may be a patient responsible charge related to this service. The patient expressed understanding and agreed to proceed.   History and Present Illness: Patient is calling in because 1 week ago she had a stuffy nose and then diarrhea for 2-3 days and then she got over it and then he started have the same symptoms for 2-3 days and is over that about 5 days ago.  2 days ago he started with a deep rattly cough and she also started having the same.  She denies any fevers or chils.  He is taking sudafed and zarby's and it is not improving.  Mom is taking Vick's.  He takes claritin everyday.  She denies him having any wheezing or shortness of breath.  No shortness of breath.  1. Allergic sinusitis     Outpatient Encounter Medications as of 02/20/2020  Medication Sig  . acetaminophen-codeine (TYLENOL #3) 300-30 MG tablet Take 2 tablets by mouth every 4 (four) hours as needed for moderate pain.  Marland Kitchen albuterol (PROVENTIL HFA;VENTOLIN HFA) 108 (90 Base) MCG/ACT inhaler Inhale 2 puffs into the lungs every 6 (six) hours as needed for wheezing or shortness of breath.  Marland Kitchen atorvastatin (LIPITOR) 20 MG tablet Take 1 tablet (20 mg total) by mouth daily.  . bevacizumab (AVASTIN) 400 MG/16ML SOLN Inject into the vein once. Every 8 weeks injected into right eye.  Marland Kitchen buPROPion (WELLBUTRIN SR) 150 MG 12 hr tablet Take 1 tablet  by mouth twice daily  . Butoconazole Nitrate, 1 Dose, 2 % CREA Place 5 g vaginally at bedtime. (Patient not taking: Reported on 01/30/2020)  . cyclobenzaprine (FLEXERIL) 10 MG tablet Take 1 tablet by mouth three times daily as needed for muscle spasm  . EQ ALLERGY RELIEF, CETIRIZINE, 10 MG tablet Take 1 tablet by mouth once daily  . Exenatide ER (BYDUREON) 2 MG PEN INJECT 2MG SUBCUTANEOUSLY ONCE A WEEK (Patient not taking: Reported on 02/13/2020)  . fluticasone (FLONASE) 50 MCG/ACT nasal spray Place 2 sprays into both nostrils daily.  . hydrochlorothiazide (HYDRODIURIL) 12.5 MG tablet Take 1 tablet by mouth once daily  . linaclotide (LINZESS) 72 MCG capsule Take 1 capsule (72 mcg total) by mouth daily before breakfast.  . metFORMIN (GLUCOPHAGE-XR) 500 MG 24 hr tablet Take 2 tablets (1,000 mg total) by mouth daily.  . metoprolol succinate (TOPROL-XL) 25 MG 24 hr tablet TAKE 1 TABLET BY MOUTH ONCE DAILY WITH MEAL OR  IMMEDIATELY  FOLLOWING  . Multiple Vitamin (MULTIVITAMIN WITH MINERALS) TABS tablet Take 1 tablet by mouth daily. womens  . norethindrone (ORTHO MICRONOR) 0.35 MG tablet Take 1 tablet (0.35 mg total) by mouth daily.  Marland Kitchen omeprazole (PRILOSEC) 40 MG capsule Take 1 capsule (40 mg total) by mouth daily.  . podofilox (CONDYLOX) 0.5 % gel Apply topically 2 (two) times daily. (Patient taking differently: Apply topically as needed. )  . predniSONE (DELTASONE) 20 MG tablet 2 po at  same time daily for 5 days  . rOPINIRole (REQUIP) 0.5 MG tablet Take 0.5 mg by mouth at bedtime.  . senna-docusate (SENOKOT-S) 8.6-50 MG tablet Take 1 tablet by mouth as needed.   . sertraline (ZOLOFT) 100 MG tablet Take 2 tablets (200 mg total) by mouth daily.  Marland Kitchen spironolactone (ALDACTONE) 100 MG tablet Take 1 tablet (100 mg total) by mouth daily.   No facility-administered encounter medications on file as of 02/20/2020.    Review of Systems  Constitutional: Negative for chills and fever.  HENT: Positive for  congestion, postnasal drip, rhinorrhea, sneezing and sore throat. Negative for ear discharge, ear pain and sinus pressure.   Eyes: Negative for pain, redness and visual disturbance.  Respiratory: Positive for cough. Negative for chest tightness and shortness of breath.   Cardiovascular: Negative for chest pain and leg swelling.  Genitourinary: Negative for difficulty urinating and dysuria.  Musculoskeletal: Negative for back pain and gait problem.  Skin: Negative for rash.  Neurological: Negative for light-headedness and headaches.  Psychiatric/Behavioral: Negative for agitation and behavioral problems.  All other systems reviewed and are negative.   Observations/Objective: Patient sounds comfortable and in no acute distress  Assessment and Plan: Problem List Items Addressed This Visit    None    Visit Diagnoses    Allergic sinusitis    -  Primary   Relevant Medications   predniSONE (DELTASONE) 20 MG tablet    Will treat like possible allergic sinusitis or residual from viral infection  Follow up plan: Return if symptoms worsen or fail to improve.     I discussed the assessment and treatment plan with the patient. The patient was provided an opportunity to ask questions and all were answered. The patient agreed with the plan and demonstrated an understanding of the instructions.   The patient was advised to call back or seek an in-person evaluation if the symptoms worsen or if the condition fails to improve as anticipated.  The above assessment and management plan was discussed with the patient. The patient verbalized understanding of and has agreed to the management plan. Patient is aware to call the clinic if symptoms persist or worsen. Patient is aware when to return to the clinic for a follow-up visit. Patient educated on when it is appropriate to go to the emergency department.    I provided 7 minutes of non-face-to-face time during this encounter.    Worthy Rancher, MD

## 2020-02-23 ENCOUNTER — Other Ambulatory Visit: Payer: Self-pay | Admitting: Family Medicine

## 2020-02-23 DIAGNOSIS — K219 Gastro-esophageal reflux disease without esophagitis: Secondary | ICD-10-CM

## 2020-02-23 DIAGNOSIS — I10 Essential (primary) hypertension: Secondary | ICD-10-CM

## 2020-02-23 DIAGNOSIS — H66003 Acute suppurative otitis media without spontaneous rupture of ear drum, bilateral: Secondary | ICD-10-CM

## 2020-02-23 DIAGNOSIS — J069 Acute upper respiratory infection, unspecified: Secondary | ICD-10-CM

## 2020-02-24 ENCOUNTER — Ambulatory Visit (INDEPENDENT_AMBULATORY_CARE_PROVIDER_SITE_OTHER): Payer: Medicaid Other | Admitting: Family Medicine

## 2020-02-24 ENCOUNTER — Encounter: Payer: Self-pay | Admitting: Family Medicine

## 2020-02-24 DIAGNOSIS — J329 Chronic sinusitis, unspecified: Secondary | ICD-10-CM | POA: Diagnosis not present

## 2020-02-24 DIAGNOSIS — J40 Bronchitis, not specified as acute or chronic: Secondary | ICD-10-CM | POA: Diagnosis not present

## 2020-02-24 MED ORDER — AZITHROMYCIN 250 MG PO TABS: ORAL_TABLET | ORAL | 0 refills | Status: DC

## 2020-02-24 NOTE — Progress Notes (Signed)
Virtual Visit via telephone Note  I connected with Heidi Landry on 02/24/20 at 0918 by telephone and verified that I am speaking with the correct person using two identifiers. Heidi Landry is currently located at home and no other people are currently with her during visit. The provider, Fransisca Kaufmann Bryten Maher, MD is located in their office at time of visit.  Call ended at 0930  I discussed the limitations, risks, security and privacy concerns of performing an evaluation and management service by telephone and the availability of in person appointments. I also discussed with the patient that there may be a patient responsible charge related to this service. The patient expressed understanding and agreed to proceed.   History and Present Illness: Patient is calling in today and has been taking the prednisone for allergic rhinitis and congestion and wheezing. She denies any fevers or chills.  Her son has also similar symptoms. She has had coughing spells and did not improve on prednisone.  1. Chronic sinusitis with recurrent bronchitis     Outpatient Encounter Medications as of 02/24/2020  Medication Sig  . acetaminophen-codeine (TYLENOL #3) 300-30 MG tablet Take 2 tablets by mouth every 4 (four) hours as needed for moderate pain.  Marland Kitchen albuterol (PROVENTIL HFA;VENTOLIN HFA) 108 (90 Base) MCG/ACT inhaler Inhale 2 puffs into the lungs every 6 (six) hours as needed for wheezing or shortness of breath.  Marland Kitchen atorvastatin (LIPITOR) 20 MG tablet Take 1 tablet (20 mg total) by mouth daily.  Marland Kitchen azithromycin (ZITHROMAX) 250 MG tablet Take 2 the first day and then one each day after.  . bevacizumab (AVASTIN) 400 MG/16ML SOLN Inject into the vein once. Every 8 weeks injected into right eye.  Marland Kitchen buPROPion (WELLBUTRIN SR) 150 MG 12 hr tablet Take 1 tablet by mouth twice daily  . Butoconazole Nitrate, 1 Dose, 2 % CREA Place 5 g vaginally at bedtime. (Patient not taking: Reported on 01/30/2020)  . cyclobenzaprine  (FLEXERIL) 10 MG tablet Take 1 tablet by mouth three times daily as needed for muscle spasm  . EQ ALLERGY RELIEF, CETIRIZINE, 10 MG tablet Take 1 tablet by mouth once daily  . Exenatide ER (BYDUREON) 2 MG PEN INJECT 2MG SUBCUTANEOUSLY ONCE A WEEK (Patient not taking: Reported on 02/13/2020)  . fluticasone (FLONASE) 50 MCG/ACT nasal spray Use 2 spray(s) in each nostril once daily  . hydrochlorothiazide (HYDRODIURIL) 12.5 MG tablet Take 1 tablet by mouth once daily  . linaclotide (LINZESS) 72 MCG capsule Take 1 capsule (72 mcg total) by mouth daily before breakfast.  . metFORMIN (GLUCOPHAGE-XR) 500 MG 24 hr tablet Take 2 tablets (1,000 mg total) by mouth daily.  . metoprolol succinate (TOPROL-XL) 25 MG 24 hr tablet TAKE 1 TABLET BY MOUTH ONCE DAILY WITH MEALS OR  IMMEDIATELY  FOLLOWING  . Multiple Vitamin (MULTIVITAMIN WITH MINERALS) TABS tablet Take 1 tablet by mouth daily. womens  . norethindrone (ORTHO MICRONOR) 0.35 MG tablet Take 1 tablet (0.35 mg total) by mouth daily.  Marland Kitchen omeprazole (PRILOSEC) 40 MG capsule Take 1 capsule by mouth once daily  . podofilox (CONDYLOX) 0.5 % gel Apply topically 2 (two) times daily. (Patient taking differently: Apply topically as needed. )  . predniSONE (DELTASONE) 20 MG tablet 2 po at same time daily for 5 days  . rOPINIRole (REQUIP) 0.5 MG tablet Take 0.5 mg by mouth at bedtime.  . senna-docusate (SENOKOT-S) 8.6-50 MG tablet Take 1 tablet by mouth as needed.   . sertraline (ZOLOFT) 100 MG tablet Take 2 tablets (  200 mg total) by mouth daily.  Marland Kitchen spironolactone (ALDACTONE) 100 MG tablet Take 1 tablet (100 mg total) by mouth daily.   No facility-administered encounter medications on file as of 02/24/2020.    Review of Systems  Constitutional: Negative for chills and fever.  HENT: Positive for congestion, postnasal drip, rhinorrhea, sinus pressure, sneezing and sore throat. Negative for ear discharge and ear pain.   Eyes: Negative for pain, redness and visual  disturbance.  Respiratory: Positive for cough and wheezing. Negative for chest tightness and shortness of breath.   Cardiovascular: Negative for chest pain and leg swelling.  Musculoskeletal: Negative for back pain and gait problem.  Skin: Negative for rash.  Neurological: Negative for light-headedness and headaches.  Psychiatric/Behavioral: Negative for agitation and behavioral problems.  All other systems reviewed and are negative.   Observations/Objective: Patient sounds comfortable and in no acute distress  Assessment and Plan: Problem List Items Addressed This Visit      Respiratory   Chronic sinusitis with recurrent bronchitis - Primary   Relevant Medications   azithromycin (ZITHROMAX) 250 MG tablet    recommended covid testing and quarantine   Give the aithromycin  Follow up plan: Return if symptoms worsen or fail to improve.     I discussed the assessment and treatment plan with the patient. The patient was provided an opportunity to ask questions and all were answered. The patient agreed with the plan and demonstrated an understanding of the instructions.   The patient was advised to call back or seek an in-person evaluation if the symptoms worsen or if the condition fails to improve as anticipated.  The above assessment and management plan was discussed with the patient. The patient verbalized understanding of and has agreed to the management plan. Patient is aware to call the clinic if symptoms persist or worsen. Patient is aware when to return to the clinic for a follow-up visit. Patient educated on when it is appropriate to go to the emergency department.    I provided 12 minutes of non-face-to-face time during this encounter.    Worthy Rancher, MD

## 2020-02-26 ENCOUNTER — Other Ambulatory Visit: Payer: Self-pay | Admitting: Family Medicine

## 2020-02-26 DIAGNOSIS — E11319 Type 2 diabetes mellitus with unspecified diabetic retinopathy without macular edema: Secondary | ICD-10-CM

## 2020-03-09 ENCOUNTER — Telehealth (INDEPENDENT_AMBULATORY_CARE_PROVIDER_SITE_OTHER): Payer: Medicaid Other | Admitting: Family Medicine

## 2020-03-09 ENCOUNTER — Encounter: Payer: Self-pay | Admitting: Family Medicine

## 2020-03-09 ENCOUNTER — Other Ambulatory Visit: Payer: Self-pay

## 2020-03-09 DIAGNOSIS — R3989 Other symptoms and signs involving the genitourinary system: Secondary | ICD-10-CM

## 2020-03-09 MED ORDER — NITROFURANTOIN MONOHYD MACRO 100 MG PO CAPS: 100.0000 mg | ORAL_CAPSULE | Freq: Two times a day (BID) | ORAL | 0 refills | Status: AC

## 2020-03-09 NOTE — Progress Notes (Signed)
Virtual Visit via Telephone Note  I connected with Heidi Landry on 03/09/20 at 3:56 PM by telephone and verified that I am speaking with the correct person using two identifiers. Heidi Landry is currently located at home and nobody is currently with her during this visit. The provider, Loman Brooklyn, FNP is located in their office at time of visit.  I discussed the limitations, risks, security and privacy concerns of performing an evaluation and management service by telephone and the availability of in person appointments. I also discussed with the patient that there may be a patient responsible charge related to this service. The patient expressed understanding and agreed to proceed.  Subjective: PCP: Baruch Gouty, FNP  Chief Complaint  Patient presents with  . Urinary Tract Infection   Urinary Tract Infection: Patient complains of dysuria and suprapubic pressure She has had symptoms for 1 day. Patient denies fever and stomach ache. Patient does have a history of recurrent UTI.  Patient does not have a history of pyelonephritis.    ROS: Per HPI  Current Outpatient Medications:  .  acetaminophen-codeine (TYLENOL #3) 300-30 MG tablet, Take 2 tablets by mouth every 4 (four) hours as needed for moderate pain., Disp: 30 tablet, Rfl: 0 .  albuterol (PROVENTIL HFA;VENTOLIN HFA) 108 (90 Base) MCG/ACT inhaler, Inhale 2 puffs into the lungs every 6 (six) hours as needed for wheezing or shortness of breath., Disp: 1 Inhaler, Rfl: 5 .  atorvastatin (LIPITOR) 20 MG tablet, Take 1 tablet (20 mg total) by mouth daily., Disp: 90 tablet, Rfl: 3 .  bevacizumab (AVASTIN) 400 MG/16ML SOLN, Inject into the vein once. Every 8 weeks injected into right eye., Disp: , Rfl:  .  buPROPion (WELLBUTRIN SR) 150 MG 12 hr tablet, Take 1 tablet by mouth twice daily, Disp: 60 tablet, Rfl: 2 .  Butoconazole Nitrate, 1 Dose, 2 % CREA, Place 5 g vaginally at bedtime. (Patient not taking: Reported on 01/30/2020),  Disp: 10 g, Rfl: 2 .  BYDUREON 2 MG PEN, INJECT 2MG SUBCUTANEOUSLY ONCE A WEEK, Disp: 12 each, Rfl: 0 .  cyclobenzaprine (FLEXERIL) 10 MG tablet, Take 1 tablet by mouth three times daily as needed for muscle spasm, Disp: 90 tablet, Rfl: 0 .  EQ ALLERGY RELIEF, CETIRIZINE, 10 MG tablet, Take 1 tablet by mouth once daily, Disp: 90 tablet, Rfl: 1 .  fluticasone (FLONASE) 50 MCG/ACT nasal spray, Use 2 spray(s) in each nostril once daily, Disp: 48 g, Rfl: 1 .  hydrochlorothiazide (HYDRODIURIL) 12.5 MG tablet, Take 1 tablet by mouth once daily, Disp: 90 tablet, Rfl: 0 .  linaclotide (LINZESS) 72 MCG capsule, Take 1 capsule (72 mcg total) by mouth daily before breakfast., Disp: 30 capsule, Rfl: 3 .  metFORMIN (GLUCOPHAGE-XR) 500 MG 24 hr tablet, Take 2 tablets (1,000 mg total) by mouth daily., Disp: 180 tablet, Rfl: 1 .  metoprolol succinate (TOPROL-XL) 25 MG 24 hr tablet, TAKE 1 TABLET BY MOUTH ONCE DAILY WITH MEALS OR  IMMEDIATELY  FOLLOWING, Disp: 90 tablet, Rfl: 1 .  Multiple Vitamin (MULTIVITAMIN WITH MINERALS) TABS tablet, Take 1 tablet by mouth daily. womens, Disp: , Rfl:  .  norethindrone (ORTHO MICRONOR) 0.35 MG tablet, Take 1 tablet (0.35 mg total) by mouth daily., Disp: 1 Package, Rfl: 2 .  omeprazole (PRILOSEC) 40 MG capsule, Take 1 capsule by mouth once daily, Disp: 90 capsule, Rfl: 1 .  podofilox (CONDYLOX) 0.5 % gel, Apply topically 2 (two) times daily. (Patient taking differently: Apply topically as needed. ),  Disp: 3.5 g, Rfl: 11 .  rOPINIRole (REQUIP) 0.5 MG tablet, Take 0.5 mg by mouth at bedtime., Disp: , Rfl:  .  senna-docusate (SENOKOT-S) 8.6-50 MG tablet, Take 1 tablet by mouth as needed. , Disp: , Rfl:  .  sertraline (ZOLOFT) 100 MG tablet, Take 2 tablets (200 mg total) by mouth daily., Disp: 60 tablet, Rfl: 6 .  spironolactone (ALDACTONE) 100 MG tablet, Take 1 tablet (100 mg total) by mouth daily., Disp: 90 tablet, Rfl: 1  No Known Allergies Past Medical History:  Diagnosis Date   . Adrenal adenoma    right  . Anxiety   . Atrial fibrillation ([place])   . Carpal tunnel syndrome, bilateral   . Chest pain   . Chronic back pain   . Degenerative disc disease   . Edema   . Gestational diabetes mellitus, antepartum   . Gestational diabetes mellitus, class A2/B 08/08/2015   Early 2hr: 84/186/117 @ 12wks   A1/B  Notified & dietician referral ordered 08/08/15   . Hypertension   . IBS (irritable bowel syndrome)   . Panic attacks   . Placental abruption in third trimester 12/07/2015  . Post partum depression   . Sleep apnea   . Spondylosis   . Tendonitis   . Thoracic spine fracture ([place]) 04/13/2012  . Varicose veins of left lower extremity with complications 00/17/4944    Observations/Objective: A&O  No respiratory distress or wheezing audible over the phone Mood, judgement, and thought processes all WNL  Assessment and Plan: 1. Suspected UTI - Education provided on UTIs. Encouraged adequate hydration.  - nitrofurantoin, macrocrystal-monohydrate, (MACROBID) 100 MG capsule; Take 1 capsule (100 mg total) by mouth 2 (two) times daily for 5 days. 1 po BId  Dispense: 10 capsule; Refill: 0   Follow Up Instructions:  I discussed the assessment and treatment plan with the patient. The patient was provided an opportunity to ask questions and all were answered. The patient agreed with the plan and demonstrated an understanding of the instructions.   The patient was advised to call back or seek an in-person evaluation if the symptoms worsen or if the condition fails to improve as anticipated.  The above assessment and management plan was discussed with the patient. The patient verbalized understanding of and has agreed to the management plan. Patient is aware to call the clinic if symptoms persist or worsen. Patient is aware when to return to the clinic for a follow-up visit. Patient educated on when it is appropriate to go to the emergency department.   Time call ended: 3:59  PM  I provided 5 minutes of non-face-to-face time during this encounter.  Hendricks Limes, MSN, APRN, FNP-C [facility] 03/09/20

## 2020-03-09 NOTE — Patient Instructions (Signed)
Urinary Tract Infection, Adult A urinary tract infection (UTI) is an infection of any part of the urinary tract. The urinary tract includes:  The kidneys.  The ureters.  The bladder.  The urethra. These organs make, store, and get rid of pee (urine) in the body. What are the causes? This is caused by germs (bacteria) in your genital area. These germs grow and cause swelling (inflammation) of your urinary tract. What increases the risk? You are more likely to develop this condition if:  You have a small, thin tube (catheter) to drain pee.  You cannot control when you pee or poop (incontinence).  You are female, and: ? You use these methods to prevent pregnancy:  A medicine that kills sperm (spermicide).  A device that blocks sperm (diaphragm). ? You have low levels of a female hormone (estrogen). ? You are pregnant.  You have genes that add to your risk.  You are sexually active.  You take antibiotic medicines.  You have trouble peeing because of: ? A prostate that is bigger than normal, if you are female. ? A blockage in the part of your body that drains pee from the bladder (urethra). ? A kidney stone. ? A nerve condition that affects your bladder (neurogenic bladder). ? Not getting enough to drink. ? Not peeing often enough.  You have other conditions, such as: ? Diabetes. ? A weak disease-fighting system (immune system). ? Sickle cell disease. ? Gout. ? Injury of the spine. What are the signs or symptoms? Symptoms of this condition include:  Needing to pee right away (urgently).  Peeing often.  Peeing small amounts often.  Pain or burning when peeing.  Blood in the pee.  Pee that smells bad or not like normal.  Trouble peeing.  Pee that is cloudy.  Fluid coming from the vagina, if you are female.  Pain in the belly or lower back. Other symptoms include:  Throwing up (vomiting).  No urge to eat.  Feeling mixed up (confused).  Being tired  and grouchy (irritable).  A fever.  Watery poop (diarrhea). How is this treated? This condition may be treated with:  Antibiotic medicine.  Other medicines.  Drinking enough water. Follow these instructions at home:  Medicines  Take over-the-counter and prescription medicines only as told by your doctor.  If you were prescribed an antibiotic medicine, take it as told by your doctor. Do not stop taking it even if you start to feel better. General instructions  Make sure you: ? Pee until your bladder is empty. ? Do not hold pee for a long time. ? Empty your bladder after sex. ? Wipe from front to back after pooping if you are a female. Use each tissue one time when you wipe.  Drink enough fluid to keep your pee pale yellow.  Keep all follow-up visits as told by your doctor. This is important. Contact a doctor if:  You do not get better after 1-2 days.  Your symptoms go away and then come back. Get help right away if:  You have very bad back pain.  You have very bad pain in your lower belly.  You have a fever.  You are sick to your stomach (nauseous).  You are throwing up. Summary  A urinary tract infection (UTI) is an infection of any part of the urinary tract.  This condition is caused by germs in your genital area.  There are many risk factors for a UTI. These include having a small, thin   tube to drain pee and not being able to control when you pee or poop.  Treatment includes antibiotic medicines for germs.  Drink enough fluid to keep your pee pale yellow. This information is not intended to replace advice given to you by your health care provider. Make sure you discuss any questions you have with your health care provider. Document Revised: 12/02/2018 Document Reviewed: 06/24/2018 Elsevier Patient Education  2020 [employer]vier Inc.  

## 2020-03-11 ENCOUNTER — Other Ambulatory Visit: Payer: Self-pay | Admitting: Family Medicine

## 2020-03-11 DIAGNOSIS — I10 Essential (primary) hypertension: Secondary | ICD-10-CM

## 2020-03-22 ENCOUNTER — Encounter: Payer: Self-pay | Admitting: *Deleted

## 2020-03-24 ENCOUNTER — Other Ambulatory Visit: Payer: Self-pay | Admitting: Obstetrics & Gynecology

## 2020-03-25 ENCOUNTER — Other Ambulatory Visit: Payer: Self-pay | Admitting: Family Medicine

## 2020-03-25 DIAGNOSIS — E1169 Type 2 diabetes mellitus with other specified complication: Secondary | ICD-10-CM

## 2020-03-27 ENCOUNTER — Telehealth: Payer: Self-pay | Admitting: Gastroenterology

## 2020-03-27 ENCOUNTER — Telehealth: Payer: Self-pay | Admitting: Internal Medicine

## 2020-03-27 DIAGNOSIS — R945 Abnormal results of liver function studies: Secondary | ICD-10-CM

## 2020-03-27 DIAGNOSIS — R7989 Other specified abnormal findings of blood chemistry: Secondary | ICD-10-CM

## 2020-03-27 DIAGNOSIS — D72829 Elevated white blood cell count, unspecified: Secondary | ICD-10-CM

## 2020-03-27 NOTE — Telephone Encounter (Signed)
Opened in error

## 2020-03-27 NOTE — Telephone Encounter (Signed)
Pt said she received a MyChart message from [facility] about having abnormal liver numbers and she has several questions about that. I told her she could use MyChart to communicate with [facility], but she said she has a lot of questions and would rather speak to something. Please call [phone]

## 2020-03-27 NOTE — Telephone Encounter (Signed)
Called pt and she is aware that [facility] received her mychart message and responded to it.  She was made aware that [facility] would call her on Thursday.  She would like to be reached between 9 and 10 if possible.

## 2020-03-27 NOTE — Telephone Encounter (Addendum)
I received mychart message from patient. I have responded in mychart. I will call her on Thursday. I have asked her to let me know what time best for her.

## 2020-03-29 ENCOUNTER — Telehealth: Payer: Self-pay | Admitting: Internal Medicine

## 2020-03-29 NOTE — Telephone Encounter (Signed)
Order placed for US biopsy liver, referral made to hematology. Called central scheduling and LMOVM for Keri making aware of order for biopsy.

## 2020-03-29 NOTE — Telephone Encounter (Signed)
LMOAM at 10:15. See other phone note, patient requested me call after 10am.   Will try again later.

## 2020-03-29 NOTE — Addendum Note (Signed)
Addended by: Cheron Every on: 03/29/2020 04:19 PM   Modules accepted: Orders

## 2020-03-29 NOTE — Telephone Encounter (Signed)
Pt just called and spoke with Erline Levine. She said that [facility] was suppose to call her today and asked for her to call after 10am because she has another appointment today at [place]. [phone]

## 2020-03-29 NOTE — Telephone Encounter (Signed)
Noted  

## 2020-03-29 NOTE — Telephone Encounter (Signed)
Patient said you were supposed to call her between 9-10 and she will not be available

## 2020-03-29 NOTE — Telephone Encounter (Signed)
Spoke with patient.  Discussed with her the rationale behind the liver biopsy as recommended by Dr. Gala Romney.  She is agreeable.  Please schedule liver biopsy with interventional radiology. -let them know her BMI is 59, ?percutaneous vs intrajugular? -patient requesting conscious sedation for liver biopsy -diagnosis is abnormal LFTs, positive AMA, need to rule out primary biliary cirrhosis (cholangitis), rule out fibrosis/cirrhosis.  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 She had referral last year but due to pandemic, could not keep appointment. She has young child and could not bring to the ov. Could she have virtual appointment?

## 2020-04-06 ENCOUNTER — Other Ambulatory Visit: Payer: Self-pay | Admitting: Family Medicine

## 2020-04-06 ENCOUNTER — Telehealth: Payer: Self-pay

## 2020-04-06 DIAGNOSIS — R1013 Epigastric pain: Secondary | ICD-10-CM

## 2020-04-06 MED ORDER — OMEPRAZOLE 20 MG PO CPDR: 20.0000 mg | DELAYED_RELEASE_CAPSULE | Freq: Two times a day (BID) | ORAL | 0 refills | Status: DC

## 2020-04-06 MED ORDER — CLARITHROMYCIN 500 MG PO TABS: 500.0000 mg | ORAL_TABLET | Freq: Two times a day (BID) | ORAL | 0 refills | Status: AC

## 2020-04-06 MED ORDER — AMOXICILLIN 500 MG PO CAPS: 1000.0000 mg | ORAL_CAPSULE | Freq: Two times a day (BID) | ORAL | 0 refills | Status: AC

## 2020-04-06 NOTE — Telephone Encounter (Signed)
Ok, will send medications to [facility]

## 2020-04-06 NOTE — Telephone Encounter (Signed)
Left message informing patient that [facility] sent in two medications to [facility] in [place] and to call the office back with any questions

## 2020-04-06 NOTE — Telephone Encounter (Signed)
Patient states that her husband Jacqulyn Bath and her oldest son have been dx with h pylori.  States that for the last few months she has been having nausea and abdominal pain.  States Rakes said she would treat if she has symptoms and she does have them.  Please advise and if approved send to [facility] in [place].

## 2020-04-10 ENCOUNTER — Encounter (HOSPITAL_COMMUNITY): Payer: Self-pay

## 2020-04-10 ENCOUNTER — Other Ambulatory Visit: Payer: Self-pay

## 2020-04-10 DIAGNOSIS — G5601 Carpal tunnel syndrome, right upper limb: Secondary | ICD-10-CM

## 2020-04-10 DIAGNOSIS — G5600 Carpal tunnel syndrome, unspecified upper limb: Secondary | ICD-10-CM | POA: Insufficient documentation

## 2020-04-11 ENCOUNTER — Inpatient Hospital Stay (HOSPITAL_COMMUNITY): Payer: Medicaid Other | Attending: Hematology | Admitting: Hematology

## 2020-04-11 ENCOUNTER — Encounter (HOSPITAL_COMMUNITY): Payer: Self-pay | Admitting: Hematology

## 2020-04-11 ENCOUNTER — Inpatient Hospital Stay (HOSPITAL_COMMUNITY): Payer: Medicaid Other

## 2020-04-11 ENCOUNTER — Other Ambulatory Visit: Payer: Self-pay | Admitting: Student

## 2020-04-11 ENCOUNTER — Other Ambulatory Visit: Payer: Self-pay | Admitting: Radiology

## 2020-04-11 VITALS — BP 119/71 | HR 89 | Temp 98.0°F | Resp 19 | Wt 325.1 lb

## 2020-04-11 DIAGNOSIS — D72829 Elevated white blood cell count, unspecified: Secondary | ICD-10-CM | POA: Insufficient documentation

## 2020-04-11 DIAGNOSIS — R160 Hepatomegaly, not elsewhere classified: Secondary | ICD-10-CM | POA: Insufficient documentation

## 2020-04-11 DIAGNOSIS — I4891 Unspecified atrial fibrillation: Secondary | ICD-10-CM | POA: Insufficient documentation

## 2020-04-11 DIAGNOSIS — F17210 Nicotine dependence, cigarettes, uncomplicated: Secondary | ICD-10-CM | POA: Insufficient documentation

## 2020-04-11 DIAGNOSIS — A630 Anogenital (venereal) warts: Secondary | ICD-10-CM | POA: Diagnosis not present

## 2020-04-11 DIAGNOSIS — I10 Essential (primary) hypertension: Secondary | ICD-10-CM | POA: Insufficient documentation

## 2020-04-11 DIAGNOSIS — Z79899 Other long term (current) drug therapy: Secondary | ICD-10-CM | POA: Diagnosis not present

## 2020-04-11 DIAGNOSIS — F419 Anxiety disorder, unspecified: Secondary | ICD-10-CM | POA: Insufficient documentation

## 2020-04-11 LAB — COMPREHENSIVE METABOLIC PANEL
ALT: 26 U/L (ref 0–44)
AST: 21 U/L (ref 15–41)
Albumin: 4 g/dL (ref 3.5–5.0)
Alkaline Phosphatase: 102 U/L (ref 38–126)
Anion gap: 10 (ref 5–15)
BUN: 15 mg/dL (ref 6–20)
CO2: 29 mmol/L (ref 22–32)
Calcium: 9.1 mg/dL (ref 8.9–10.3)
Chloride: 99 mmol/L (ref 98–111)
Creatinine, Ser: 0.53 mg/dL (ref 0.44–1.00)
GFR calc Af Amer: >60 mL/min (ref >60)
GFR calc non Af Amer: >60 mL/min (ref >60)
Glucose, Bld: 80 mg/dL (ref 70–99)
Potassium: 4.2 mmol/L (ref 3.5–5.1)
Sodium: 138 mmol/L (ref 135–145)
Total Bilirubin: 0.7 mg/dL (ref 0.3–1.2)
Total Protein: 7.7 g/dL (ref 6.5–8.1)

## 2020-04-11 LAB — CBC WITH DIFFERENTIAL/PLATELET
Abs Immature Granulocytes: 0 10*3/uL (ref 0.00–0.07)
Band Neutrophils: 0 %
Basophils Absolute: 0 10*3/uL (ref 0.0–0.1)
Basophils Relative: 0 %
Blasts: 0 %
Eosinophils Absolute: 0.3 10*3/uL (ref 0.0–0.5)
Eosinophils Relative: 2 %
HCT: 40.5 % (ref 36.0–46.0)
Hemoglobin: 12.6 g/dL (ref 12.0–15.0)
Lymphocytes Relative: 26 %
Lymphs Abs: 3.6 10*3/uL (ref 0.7–4.0)
MCH: 25.6 pg — ABNORMAL LOW (ref 26.0–34.0)
MCHC: 31.1 g/dL (ref 30.0–36.0)
MCV: 82.3 fL (ref 80.0–100.0)
Metamyelocytes Relative: 0 %
Monocytes Absolute: 0.7 10*3/uL (ref 0.1–1.0)
Monocytes Relative: 5 %
Myelocytes: 0 %
Neutro Abs: 9.1 10*3/uL — ABNORMAL HIGH (ref 1.7–7.7)
Neutrophils Relative %: 67 %
Other: 0 %
Platelets: 232 10*3/uL (ref 150–400)
Promyelocytes Relative: 0 %
RBC: 4.92 MIL/uL (ref 3.87–5.11)
RDW: 16.1 % — ABNORMAL HIGH (ref 11.5–15.5)
WBC: 13.7 10*3/uL — ABNORMAL HIGH (ref 4.0–10.5)
nRBC: 0 % (ref 0.0–0.2)
nRBC: 0 /100 WBC

## 2020-04-11 LAB — LACTATE DEHYDROGENASE: LDH: 127 U/L (ref 98–192)

## 2020-04-11 LAB — SAVE SMEAR(SSMR), FOR PROVIDER SLIDE REVIEW

## 2020-04-11 NOTE — Assessment & Plan Note (Addendum)
1.  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 -Most of the time it was neutrophilic leukocytosis and occasionally lymphocytes. -MRI of the abdomen on 10/24/2019 shows hepatomegaly with severe hepatic steatosis.  Spleen was normal. -She is having a liver biopsy done tomorrow. -Smokes 1 pack/day for 13 years.  Denies any fevers, night sweats or weight loss. -She is being treated for H. pylori with antibiotics. -We will repeat her CBC and review her smear.  We will check for connective tissue disorders.  We will also check for myeloproliferative disorders with the JAK2 V617F and BCR/ABL.  We will do flow cytometry as her lymphocytes are occasionally elevated. -Differential diagnosis includes smoking induced leukocytosis from demargination.  2.  Hepatomegaly: -MRI recently showed diffuse loss of signal intensity throughout the hepatic parenchyma with no suspicious solid lesions.  No intrahepatic or extra Badik biliary ductal dilatation.  Spleen was unremarkable. -She is having a liver biopsy done tomorrow.

## 2020-04-11 NOTE — Patient Instructions (Signed)
[place] at [facility] Discharge Instructions  Follow up in 2-3 weeks   Thank you for choosing [place] at [facility] to provide your oncology and hematology care.  To afford each patient quality time with our provider, please arrive at least 15 minutes before your scheduled appointment time.   If you have a lab appointment with the [facility] please come in thru the Main Entrance and check in at the main information desk.  You need to re-schedule your appointment should you arrive 10 or more minutes late.  We strive to give you quality time with our providers, and arriving late affects you and other patients whose appointments are after yours.  Also, if you no show three or more times for appointments you may be dismissed from the clinic at the providers discretion.     Again, thank you for choosing [facility].  Our hope is that these requests will decrease the amount of time that you wait before being seen by our physicians.       _____________________________________________________________  Should you have questions after your visit to [facility], please contact our office at [phone] between the hours of 8:00 a.m. and 4:30 p.m.  Voicemails left after 4:00 p.m. will not be returned until the following business day.  For prescription refill requests, have your pharmacy contact our office and allow 72 hours.    Due to Covid, you will need to wear a mask upon entering the hospital. If you do not have a mask, a mask will be given to you at the Main Entrance upon arrival. For doctor visits, patients may have 1 support person with them. For treatment visits, patients can not have anyone with them due to social distancing guidelines and our immunocompromised population.

## 2020-04-11 NOTE — Progress Notes (Signed)
CONSULT NOTE  Patient Care Team: Baruch Gouty, FNP as PCP - General (Family Medicine) Harl Bowie, Alphonse Guild, MD as PCP - Cardiology (Cardiology) Gala Romney Cristopher Estimable, MD as Consulting Physician (Gastroenterology)  CHIEF COMPLAINTS/PURPOSE OF CONSULTATION: Leukocytosis  HISTORY OF PRESENTING ILLNESS:  Heidi Landry 34 y.o. female was sent here by her PCP for leukocytosis.  It is documented that she has had leukocytosis since 2008.  She reports she is getting active treatment for H. pylori.  She is on 2 different types of antibiotics at this time.  She also reports she has an eye condition where she uses eyedrops and gets injections in her as periodically.  She denies any documented autoimmune disorders however she is receiving a liver biopsy tomorrow to check for an autoimmune condition that affects her bile ducts.  She denies any alcohol or illicit drugs.  She has smoked since the age of 4 about 1 pack/day.  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  She denies any other chronic infections.  Patient denies any B symptoms including night sweats, fevers, chills or unexplained weight loss.  She denies any recent chest pain on exertion, shortness of breath on exertion, presyncopal episodes or palpitations.  She has not noticed any recent bleeding such as epistasis, hematuria or hematochezia.  Patient denies any over-the-counter NSAID ingestion.  She has no prior history or diagnosis of cancer.  She does have a family history of a dad with colon cancer and lung cancer.  Paternal grandmother with small cell lung cancer.  Maternal grandmother with ovarian cancer and a maternal uncle with mouth cancer.   MEDICAL HISTORY:  Past Medical History:  Diagnosis Date  . Adrenal adenoma    right  . Anxiety   . Atrial fibrillation ([place])   . Carpal tunnel syndrome   . Carpal tunnel syndrome   . Carpal tunnel syndrome, bilateral   . Chest pain   . Chronic back pain   . Degenerative disc disease    . Edema   . Gestational diabetes mellitus, antepartum   . Gestational diabetes mellitus, class A2/B 08/08/2015   Early 2hr: 84/186/117 @ 12wks   A1/B  Notified & dietician referral ordered 08/08/15   . Hypertension   . IBS (irritable bowel syndrome)   . Panic attacks   . Placental abruption in third trimester 12/07/2015  . Post partum depression   . Sleep apnea   . Spondylosis   . Tendonitis   . Thoracic spine fracture ([place]) 04/13/2012  . Varicose veins of left lower extremity with complications 46/50/3546    SURGICAL HISTORY: Past Surgical History:  Procedure Laterality Date  . CESAREAN SECTION N/A 12/08/2015   Procedure: CESAREAN SECTION;  Surgeon: Jonnie Kind, MD;  Location: [facility];  Service: Obstetrics;  Laterality: N/A;  . CHOLECYSTECTOMY    . KNEE SURGERY Right 01/29/2019   at [facility] in [place], [place]; s/p fall and deel leg laceration    SOCIAL HISTORY: Social History   Socioeconomic History  . Marital status: Married    Spouse name: Not on file  . Number of children: 1  . Years of education: 85  . Highest education level: Not on file  Occupational History  . Occupation: disabled-SSI    Employer: [employer]  Tobacco Use  . Smoking status: Current Every Day Smoker    Packs/day: 0.50    Years: 9.00    Pack years: 4.50    Types: Cigarettes  Start date: 05/25/2006  . Smokeless tobacco: Never Used  Substance and Sexual Activity  . Alcohol use: Not Currently    Alcohol/week: 0.0 standard drinks    Comment: rare  . Drug use: No  . Sexual activity: Yes    Birth control/protection: Pill  Other Topics Concern  . Not on file  Social History Narrative  . Not on file   Social Determinants of Health   Financial Resource Strain:   . Difficulty of Paying Living Expenses:   Food Insecurity:   . Worried About Charity fundraiser in the Last Year:   . Arboriculturist in the Last Year:   Transportation Needs:   . Lexicographer (Medical):   Marland Kitchen Lack of Transportation (Non-Medical):   Physical Activity:   . Days of Exercise per Week:   . Minutes of Exercise per Session:   Stress:   . Feeling of Stress :   Social Connections:   . Frequency of Communication with Friends and Family:   . Frequency of Social Gatherings with Friends and Family:   . Attends Religious Services:   . Active Member of Clubs or Organizations:   . Attends Archivist Meetings:   Marland Kitchen Marital Status:   Intimate Partner Violence:   . Fear of Current or Ex-Partner:   . Emotionally Abused:   Marland Kitchen Physically Abused:   . Sexually Abused:     FAMILY HISTORY: Family History  Problem Relation Age of Onset  . Fibromyalgia Mother   . Carpal tunnel syndrome Mother   . Hernia Father   . Lung cancer Father   . Colon cancer Father 66       partial colectomy  . Liver disease Father        Fatty liver; ? ETOH-related  . Hypertension Father   . Diabetes Maternal Grandmother   . Hypertension Maternal Grandmother   . Ovarian cancer Maternal Grandmother   . Cancer Paternal Uncle   . Brain cancer Paternal Uncle   . Throat cancer Paternal Uncle   . Heart attack Maternal Grandfather   . Hyperlipidemia Paternal Grandmother   . Hypertension Paternal Grandmother   . Lung cancer Paternal Grandmother   . Thyroid disease Paternal Aunt     ALLERGIES:  has No Known Allergies.  MEDICATIONS:  Current Outpatient Medications  Medication Sig Dispense Refill  . acetaminophen-codeine (TYLENOL #3) 300-30 MG tablet Take 2 tablets by mouth every 4 (four) hours as needed for moderate pain. (Patient not taking: Reported on 04/10/2020) 30 tablet 0  . albuterol (PROVENTIL HFA;VENTOLIN HFA) 108 (90 Base) MCG/ACT inhaler Inhale 2 puffs into the lungs every 6 (six) hours as needed for wheezing or shortness of breath. (Patient not taking: Reported on 04/10/2020) 1 Inhaler 5  . amoxicillin (AMOXIL) 500 MG capsule Take 2 capsules (1,000 mg total) by mouth  2 (two) times daily for 14 days. 56 capsule 0  . atorvastatin (LIPITOR) 20 MG tablet Take 1 tablet by mouth once daily 30 tablet 1  . bevacizumab (AVASTIN) 400 MG/16ML SOLN Inject into the vein once. Every 8 weeks injected into right eye.    Marland Kitchen buPROPion (WELLBUTRIN SR) 150 MG 12 hr tablet Take 1 tablet by mouth twice daily (Patient taking differently: Take 300 mg by mouth daily. ) 60 tablet 2  . BYDUREON 2 MG PEN INJECT 2MG SUBCUTANEOUSLY ONCE A WEEK (Patient taking differently: Inject 2 mg into the skin every Saturday. INJECT 2MG SUBCUTANEOUSLY ONCE A WEEK) 12  each 0  . clarithromycin (BIAXIN) 500 MG tablet Take 1 tablet (500 mg total) by mouth 2 (two) times daily for 14 days. 28 tablet 0  . cyclobenzaprine (FLEXERIL) 10 MG tablet Take 1 tablet by mouth three times daily as needed for muscle spasm (Patient not taking: Reported on 04/10/2020) 90 tablet 0  . EQ ALLERGY RELIEF, CETIRIZINE, 10 MG tablet Take 1 tablet by mouth once daily (Patient taking differently: Take 10 mg by mouth daily. ) 90 tablet 1  . fluticasone (FLONASE) 50 MCG/ACT nasal spray Use 2 spray(s) in each nostril once daily (Patient not taking: No sig reported) 48 g 1  . hydrochlorothiazide (HYDRODIURIL) 12.5 MG tablet Take 1 tablet by mouth once daily 90 tablet 0  . linaclotide (LINZESS) 72 MCG capsule Take 1 capsule (72 mcg total) by mouth daily before breakfast. 30 capsule 3  . metFORMIN (GLUCOPHAGE-XR) 500 MG 24 hr tablet Take 2 tablets (1,000 mg total) by mouth daily. 180 tablet 1  . metoprolol succinate (TOPROL-XL) 25 MG 24 hr tablet TAKE 1 TABLET BY MOUTH ONCE DAILY WITH MEALS OR  IMMEDIATELY  FOLLOWING 90 tablet 1  . Multiple Vitamin (MULTIVITAMIN WITH MINERALS) TABS tablet Take 1 tablet by mouth daily. womens    . norethindrone (MICRONOR) 0.35 MG tablet Take 1 tablet by mouth once daily (Patient taking differently: Take 1 tablet by mouth daily. ) 28 tablet 0  . omeprazole (PRILOSEC) 20 MG capsule Take 1 capsule (20 mg total)  by mouth 2 (two) times daily before a meal for 14 days. 28 capsule 0  . podofilox (CONDYLOX) 0.5 % gel Apply topically 2 (two) times daily. (Patient not taking: Reported on 04/10/2020) 3.5 g 11  . Probiotic Product (PROBIOTIC DAILY PO) Take 1 capsule by mouth daily. Women's care    . rOPINIRole (REQUIP) 0.5 MG tablet Take 0.5 mg by mouth at bedtime.    . sertraline (ZOLOFT) 100 MG tablet Take 200 mg by mouth daily.    Marland Kitchen spironolactone (ALDACTONE) 100 MG tablet Take 1 tablet (100 mg total) by mouth daily. 90 tablet 1   No current facility-administered medications for this visit.    REVIEW OF SYSTEMS:   Constitutional: Denies fevers, chills or abnormal night sweats Respiratory: Denies cough, dyspnea or wheezes Cardiovascular: Denies palpitation, chest discomfort or lower extremity swelling Gastrointestinal:  Denies nausea, heartburn or change in bowel habits.  Reports chronic constipation and diarrhea from IBS. Skin: Denies abnormal skin rashes Lymphatics: Denies new lymphadenopathy or easy bruising Neurological: No focal weakness.  Reports numbness in the hands and legs. Behavioral/Psych: Mood is stable, no new changes  All other systems were reviewed with the patient and are negative.  PHYSICAL EXAMINATION: ECOG PERFORMANCE STATUS: 0 - Asymptomatic  Vitals:   04/11/20 0927  BP: 119/71  Pulse: 89  Resp: 19  Temp: 98 F (36.7 C)  SpO2: 98%   Filed Weights   04/11/20 0927  Weight: (!) 325 lb 1.6 oz (147.5 kg)    GENERAL:alert, no distress and comfortable SKIN: skin color, texture, turgor are normal, no rashes or significant lesions NECK: supple, thyroid normal size, non-tender, without nodularity LYMPH:  no palpable lymphadenopathy in the cervical, axillary or inguinal LUNGS: clear to auscultation and percussion with normal breathing effort HEART: regular rate & rhythm and no murmurs and no lower extremity edema ABDOMEN:abdomen soft, non-tender and normal bowel  sounds Musculoskeletal:no cyanosis of digits and no clubbing  PSYCH: alert & oriented x 3 with fluent speech NEURO: no focal  motor/sensory deficits  LABORATORY DATA:  I have reviewed the data as listed Recent Results (from the past 2160 hour(s))  Hepatic function panel     Status: Abnormal   Collection Time: 01/30/20 12:12 PM  Result Value Ref Range   Total Protein 7.2 6.0 - 8.5 g/dL   Albumin 4.5 3.8 - 4.8 g/dL   Bilirubin Total 0.4 0.0 - 1.2 mg/dL   Bilirubin, Direct 0.11 0.00 - 0.40 mg/dL   Alkaline Phosphatase 126 (H) 39 - 117 IU/L   AST 18 0 - 40 IU/L   ALT 24 0 - 32 IU/L  Chlamydia/Gonococcus/Trichomonas, NAA     Status: None   Collection Time: 01/30/20 12:51 PM   Specimen: Urine   URINE  Result Value Ref Range   Chlamydia by NAA Negative Negative   Gonococcus by NAA Negative Negative   Trich vag by NAA Negative Negative  Urinalysis, Complete     Status: None   Collection Time: 01/30/20 12:51 PM  Result Value Ref Range   Specific Gravity, UA 1.025 1.005 - 1.030   pH, UA 6.0 5.0 - 7.5   Color, UA Yellow Yellow   Appearance Ur Clear Clear   Leukocytes,UA Negative Negative   Protein,UA Negative Negative/Trace   Glucose, UA Negative Negative   Ketones, UA Negative Negative   RBC, UA Negative Negative   Bilirubin, UA Negative Negative   Urobilinogen, Ur 0.2 0.2 - 1.0 mg/dL   Nitrite, UA Negative Negative   Microscopic Examination See below:   WET PREP FOR TRICH, YEAST, CLUE     Status: None   Collection Time: 01/30/20 12:51 PM   Specimen: Vaginal Fluid   VAGINAL FLUI  Result Value Ref Range   Trichomonas Exam Negative Negative   Yeast Exam Negative Negative   Clue Cell Exam Negative Negative    Comment: wbc  5-10 epi's  few bacteria  many   Microscopic Examination     Status: Abnormal   Collection Time: 01/30/20 12:51 PM   URINE  Result Value Ref Range   WBC, UA 0-5 0 - 5 /hpf   RBC None seen 0 - 2 /hpf   Epithelial Cells (non renal) >10 (A) 0 - 10 /hpf    Renal Epithel, UA None seen None seen /hpf   Bacteria, UA Few None seen/Few  Bayer DCA Hb A1c Waived     Status: None   Collection Time: 01/30/20 12:57 PM  Result Value Ref Range   HB A1C (BAYER DCA - WAIVED) 6.4 <7.0 %    Comment:                                       Diabetic Adult            <7.0                                       Healthy Adult        4.3 - 5.7                                                           (DCCT/NGSP) American Diabetes  Association's Summary of Glycemic Recommendations for Adults with Diabetes: Hemoglobin A1c <7.0%. More stringent glycemic goals (A1c <6.0%) may further reduce complications at the cost of increased risk of hypoglycemia.   CBC with Differential/Platelet     Status: Abnormal   Collection Time: 01/30/20 12:57 PM  Result Value Ref Range   WBC 14.0 (H) 3.4 - 10.8 x10E3/uL   RBC 5.07 3.77 - 5.28 x10E6/uL   Hemoglobin 12.8 11.1 - 15.9 g/dL   Hematocrit 40.9 34.0 - 46.6 %   MCV 81 79 - 97 fL   MCH 25.2 (L) 26.6 - 33.0 pg   MCHC 31.3 (L) 31.5 - 35.7 g/dL   RDW 15.0 11.7 - 15.4 %   Platelets 221 150 - 450 x10E3/uL   Neutrophils 70 Not Estab. %   Lymphs 22 Not Estab. %   Monocytes 5 Not Estab. %   Eos 1 Not Estab. %   Basos 1 Not Estab. %   Neutrophils Absolute 9.8 (H) 1.4 - 7.0 x10E3/uL   Lymphocytes Absolute 3.1 0.7 - 3.1 x10E3/uL   Monocytes Absolute 0.7 0.1 - 0.9 x10E3/uL   EOS (ABSOLUTE) 0.2 0.0 - 0.4 x10E3/uL   Basophils Absolute 0.1 0.0 - 0.2 x10E3/uL   Immature Granulocytes 1 Not Estab. %   Immature Grans (Abs) 0.1 0.0 - 0.1 x10E3/uL  CMP14+EGFR     Status: Abnormal   Collection Time: 01/30/20 12:57 PM  Result Value Ref Range   Glucose 82 65 - 99 mg/dL   BUN 14 6 - 20 mg/dL   Creatinine, Ser 0.72 0.57 - 1.00 mg/dL   GFR calc non Af Amer 110 >59 mL/min/1.73   GFR calc Af Amer 126 >59 mL/min/1.73   BUN/Creatinine Ratio 19 9 - 23   Sodium 141 134 - 144 mmol/L   Potassium 4.6 3.5 - 5.2 mmol/L   Chloride 97 96 - 106 mmol/L    CO2 27 20 - 29 mmol/L   Calcium 9.1 8.7 - 10.2 mg/dL   Total Protein 7.0 6.0 - 8.5 g/dL   Albumin 4.3 3.8 - 4.8 g/dL   Globulin, Total 2.7 1.5 - 4.5 g/dL   Albumin/Globulin Ratio 1.6 1.2 - 2.2   Bilirubin Total 0.4 0.0 - 1.2 mg/dL   Alkaline Phosphatase 122 (H) 39 - 117 IU/L   AST 17 0 - 40 IU/L   ALT 26 0 - 32 IU/L  Lipid panel     Status: Abnormal   Collection Time: 01/30/20 12:57 PM  Result Value Ref Range   Cholesterol, Total 130 100 - 199 mg/dL   Triglycerides 214 (H) 0 - 149 mg/dL   HDL 36 (L) >39 mg/dL   VLDL Cholesterol Cal 35 5 - 40 mg/dL   LDL Chol Calc (NIH) 59 0 - 99 mg/dL   Chol/HDL Ratio 3.6 0.0 - 4.4 ratio    Comment:                                   T. Chol/HDL Ratio                                             Men  Women  1/2 Avg.Risk  3.4    3.3                                   Avg.Risk  5.0    4.4                                2X Avg.Risk  9.6    7.1                                3X Avg.Risk 23.4   11.0   WET PREP FOR TRICH, YEAST, CLUE     Status: None   Collection Time: 02/08/20 12:42 PM   Specimen: Vaginal Fluid   VAGINAL FLUI  Result Value Ref Range   Trichomonas Exam Negative Negative   Yeast Exam Negative Negative   Clue Cell Exam Negative Negative  CBC with Differential/Platelet     Status: Abnormal   Collection Time: 02/08/20  2:22 PM  Result Value Ref Range   WBC 13.5 (H) 3.4 - 10.8 x10E3/uL   RBC 4.98 3.77 - 5.28 x10E6/uL   Hemoglobin 12.4 11.1 - 15.9 g/dL   Hematocrit 39.4 34.0 - 46.6 %   MCV 79 79 - 97 fL   MCH 24.9 (L) 26.6 - 33.0 pg   MCHC 31.5 31.5 - 35.7 g/dL   RDW 15.0 11.7 - 15.4 %   Platelets 202 150 - 450 x10E3/uL   Neutrophils 73 Not Estab. %   Lymphs 20 Not Estab. %   Monocytes 5 Not Estab. %   Eos 1 Not Estab. %   Basos 0 Not Estab. %   Neutrophils Absolute 9.9 (H) 1.4 - 7.0 x10E3/uL   Lymphocytes Absolute 2.7 0.7 - 3.1 x10E3/uL   Monocytes Absolute 0.7 0.1 - 0.9 x10E3/uL   EOS (ABSOLUTE)  0.2 0.0 - 0.4 x10E3/uL   Basophils Absolute 0.1 0.0 - 0.2 x10E3/uL   Immature Granulocytes 1 Not Estab. %   Immature Grans (Abs) 0.1 0.0 - 0.1 x10E3/uL  Hepatic function panel     Status: Abnormal   Collection Time: 02/08/20  2:22 PM  Result Value Ref Range   Total Protein 7.1 6.0 - 8.5 g/dL   Albumin 4.5 3.8 - 4.8 g/dL   Bilirubin Total 0.4 0.0 - 1.2 mg/dL   Bilirubin, Direct 0.11 0.00 - 0.40 mg/dL   Alkaline Phosphatase 122 (H) 39 - 117 IU/L   AST 20 0 - 40 IU/L   ALT 22 0 - 32 IU/L  Alkaline phosphatase, isoenzymes     Status: Abnormal   Collection Time: 02/08/20  2:22 PM  Result Value Ref Range   Alkaline Phosphatase 125 (H) 39 - 117 IU/L   LIVER FRACTION 70 18 - 85 %   BONE FRACTION 23 14 - 68 %   INTESTINAL FRAC. 7 0 - 18 %  Gamma GT     Status: None   Collection Time: 02/08/20  2:22 PM  Result Value Ref Range   GGT 18 0 - 60 IU/L  Specimen status report     Status: None   Collection Time: 02/08/20  2:22 PM  Result Value Ref Range   specimen status report Comment     Comment: Written Authorization Written Authorization Written Authorization Received. Authorization received from [employer] 02-10-2020 Logged by Lilia Pro  Perrot[place]   CBC with Differential/Platelet     Status: Abnormal (Preliminary result)   Collection Time: 04/11/20 11:15 AM  Result Value Ref Range   WBC 13.7 (H) 4.0 - 10.5 K/uL   RBC 4.92 3.87 - 5.11 MIL/uL   Hemoglobin 12.6 12.0 - 15.0 g/dL   HCT 40.5 36.0 - 46.0 %   MCV 82.3 80.0 - 100.0 fL   MCH 25.6 (L) 26.0 - 34.0 pg   MCHC 31.1 30.0 - 36.0 g/dL   RDW 16.1 (H) 11.5 - 15.5 %   Platelets 232 150 - 400 K/uL   nRBC 0.0 0.0 - 0.2 %    Comment: Performed at [facility], [address], [place], [place] [address]   Neutrophils Relative % PENDING %   Neutro Abs PENDING 1.7 - 7.7 K/uL   Band Neutrophils PENDING %   Lymphocytes Relative PENDING %   Lymphs Abs PENDING 0.7 - 4.0 K/uL   Monocytes Relative PENDING %   Monocytes Absolute PENDING 0.1 -  1.0 K/uL   Eosinophils Relative PENDING %   Eosinophils Absolute PENDING 0.0 - 0.5 K/uL   Basophils Relative PENDING %   Basophils Absolute PENDING 0.0 - 0.1 K/uL   WBC Morphology PENDING    RBC Morphology PENDING    Smear Review PENDING    Other PENDING %   nRBC PENDING 0 /100 WBC   Metamyelocytes Relative PENDING %   Myelocytes PENDING %   Promyelocytes Relative PENDING %   Blasts PENDING %   Immature Granulocytes PENDING %   Abs Immature Granulocytes PENDING 0.00 - 0.07 K/uL  Comprehensive metabolic panel     Status: None   Collection Time: 04/11/20 11:15 AM  Result Value Ref Range   Sodium 138 135 - 145 mmol/L   Potassium 4.2 3.5 - 5.1 mmol/L   Chloride 99 98 - 111 mmol/L   CO2 29 22 - 32 mmol/L   Glucose, Bld 80 70 - 99 mg/dL    Comment: Glucose reference range applies only to samples taken after fasting for at least 8 hours.   BUN 15 6 - 20 mg/dL   Creatinine, Ser 0.53 0.44 - 1.00 mg/dL   Calcium 9.1 8.9 - 10.3 mg/dL   Total Protein 7.7 6.5 - 8.1 g/dL   Albumin 4.0 3.5 - 5.0 g/dL   AST 21 15 - 41 U/L   ALT 26 0 - 44 U/L   Alkaline Phosphatase 102 38 - 126 U/L   Total Bilirubin 0.7 0.3 - 1.2 mg/dL   GFR calc non Af Amer >60 >60 mL/min   GFR calc Af Amer >60 >60 mL/min   Anion gap 10 5 - 15    Comment: Performed at [facility], [address], [place],  [place] [address]  Lactate dehydrogenase     Status: None   Collection Time: 04/11/20 11:15 AM  Result Value Ref Range   LDH 127 98 - 192 U/L    Comment: Performed at [facility], [address], [place], [place] [address]    RADIOGRAPHIC STUDIES: I have personally reviewed the radiological images as listed and agreed with the findings in the report. I have independently interviewed this patient and agree with HPI documented by my Nurse practitioner Wenda Low, FNP.  I have independently formulated my assessment and plan.  ASSESSMENT & PLAN:  Leukocytosis 1.  Leukocytosis: -She was found to have incidental  leukocytosis since 2008. -Most of the time it was neutrophilic leukocytosis and occasionally lymphocytes. -MRI of  the abdomen on 10/24/2019 shows hepatomegaly with severe hepatic steatosis.  Spleen was normal. -She is having a liver biopsy done tomorrow. -Smokes 1 pack/day for 13 years.  Denies any fevers, night sweats or weight loss. -She is being treated for H. pylori with antibiotics. -We will repeat her CBC and review her smear.  We will check for connective tissue disorders.  We will also check for myeloproliferative disorders with the JAK2 V617F and BCR/ABL.  We will do flow cytometry as her lymphocytes are occasionally elevated. -Differential diagnosis includes smoking induced leukocytosis from demargination.  2.  Hepatomegaly: -MRI recently showed diffuse loss of signal intensity throughout the hepatic parenchyma with no suspicious solid lesions.  No intrahepatic or extra [place] biliary ductal dilatation.  Spleen was unremarkable. -She is having a liver biopsy done tomorrow.     All questions were answered. The patient knows to call the clinic with any problems, questions or concerns.     Derek Jack, MD 04/11/20 1:40 PM

## 2020-04-12 ENCOUNTER — Other Ambulatory Visit: Payer: Self-pay | Admitting: Physician Assistant

## 2020-04-12 ENCOUNTER — Encounter (HOSPITAL_COMMUNITY): Payer: Self-pay

## 2020-04-12 ENCOUNTER — Ambulatory Visit (HOSPITAL_COMMUNITY)
Admission: RE | Admit: 2020-04-12 | Discharge: 2020-04-12 | Disposition: A | Discharge status: Home / Self Care | Payer: Medicaid Other | Source: Ambulatory Visit | Attending: Gastroenterology | Admitting: Gastroenterology

## 2020-04-12 ENCOUNTER — Other Ambulatory Visit: Payer: Self-pay

## 2020-04-12 DIAGNOSIS — I4891 Unspecified atrial fibrillation: Secondary | ICD-10-CM | POA: Insufficient documentation

## 2020-04-12 DIAGNOSIS — Z801 Family history of malignant neoplasm of trachea, bronchus and lung: Secondary | ICD-10-CM | POA: Insufficient documentation

## 2020-04-12 DIAGNOSIS — R945 Abnormal results of liver function studies: Secondary | ICD-10-CM

## 2020-04-12 DIAGNOSIS — K589 Irritable bowel syndrome without diarrhea: Secondary | ICD-10-CM | POA: Diagnosis not present

## 2020-04-12 DIAGNOSIS — K7460 Unspecified cirrhosis of liver: Secondary | ICD-10-CM | POA: Diagnosis not present

## 2020-04-12 DIAGNOSIS — G4730 Sleep apnea, unspecified: Secondary | ICD-10-CM | POA: Insufficient documentation

## 2020-04-12 DIAGNOSIS — Z79899 Other long term (current) drug therapy: Secondary | ICD-10-CM | POA: Diagnosis not present

## 2020-04-12 DIAGNOSIS — Z800 Family history of malignant neoplasm of digestive organs: Secondary | ICD-10-CM | POA: Insufficient documentation

## 2020-04-12 DIAGNOSIS — R748 Abnormal levels of other serum enzymes: Secondary | ICD-10-CM | POA: Insufficient documentation

## 2020-04-12 DIAGNOSIS — R7989 Other specified abnormal findings of blood chemistry: Secondary | ICD-10-CM

## 2020-04-12 DIAGNOSIS — Z9049 Acquired absence of other specified parts of digestive tract: Secondary | ICD-10-CM | POA: Diagnosis not present

## 2020-04-12 DIAGNOSIS — I10 Essential (primary) hypertension: Secondary | ICD-10-CM | POA: Insufficient documentation

## 2020-04-12 DIAGNOSIS — Z8379 Family history of other diseases of the digestive system: Secondary | ICD-10-CM | POA: Diagnosis not present

## 2020-04-12 DIAGNOSIS — F17210 Nicotine dependence, cigarettes, uncomplicated: Secondary | ICD-10-CM | POA: Diagnosis not present

## 2020-04-12 DIAGNOSIS — E119 Type 2 diabetes mellitus without complications: Secondary | ICD-10-CM | POA: Insufficient documentation

## 2020-04-12 DIAGNOSIS — Z7984 Long term (current) use of oral hypoglycemic drugs: Secondary | ICD-10-CM | POA: Diagnosis not present

## 2020-04-12 DIAGNOSIS — Z8249 Family history of ischemic heart disease and other diseases of the circulatory system: Secondary | ICD-10-CM | POA: Insufficient documentation

## 2020-04-12 DIAGNOSIS — K7581 Nonalcoholic steatohepatitis (NASH): Secondary | ICD-10-CM | POA: Diagnosis not present

## 2020-04-12 DIAGNOSIS — Z833 Family history of diabetes mellitus: Secondary | ICD-10-CM | POA: Diagnosis not present

## 2020-04-12 LAB — GLUCOSE, CAPILLARY: Glucose-Capillary: 120 mg/dL — ABNORMAL HIGH (ref 70–99)

## 2020-04-12 LAB — PROTIME-INR
INR: 1 (ref 0.8–1.2)
Prothrombin Time: 12.8 seconds (ref 11.4–15.2)

## 2020-04-12 LAB — SURGICAL PATHOLOGY

## 2020-04-12 LAB — ANTINUCLEAR ANTIBODIES, IFA: ANA Ab, IFA: NEGATIVE

## 2020-04-12 LAB — PREGNANCY, URINE: Preg Test, Ur: NEGATIVE

## 2020-04-12 LAB — RHEUMATOID FACTOR: Rheumatoid fact SerPl-aCnc: <10 IU/mL (ref 0.0–13.9)

## 2020-04-12 MED ORDER — FENTANYL CITRATE (PF) 100 MCG/2ML IJ SOLN
INTRAMUSCULAR | Status: AC
  Filled 2020-04-12: qty 2

## 2020-04-12 MED ORDER — LIDOCAINE HCL (PF) 1 % IJ SOLN
INTRAMUSCULAR | Status: AC
  Filled 2020-04-12: qty 30

## 2020-04-12 MED ORDER — GELATIN ABSORBABLE 12-7 MM EX MISC
CUTANEOUS | Status: AC
  Filled 2020-04-12: qty 1

## 2020-04-12 MED ORDER — MIDAZOLAM HCL 2 MG/2ML IJ SOLN
INTRAMUSCULAR | Status: AC
  Filled 2020-04-12: qty 2

## 2020-04-12 MED ORDER — FENTANYL CITRATE (PF) 100 MCG/2ML IJ SOLN
INTRAMUSCULAR | Status: AC | PRN
  Administered 2020-04-12 (×2): 25 ug via INTRAVENOUS

## 2020-04-12 MED ORDER — SODIUM CHLORIDE 0.9 % IV SOLN: INTRAVENOUS | Status: DC

## 2020-04-12 MED ORDER — MIDAZOLAM HCL 2 MG/2ML IJ SOLN
INTRAMUSCULAR | Status: AC | PRN
  Administered 2020-04-12: 0.5 mg via INTRAVENOUS
  Administered 2020-04-12: 1 mg via INTRAVENOUS

## 2020-04-12 NOTE — Discharge Instructions (Addendum)
Liver Biopsy  The liver is a large organ in the upper right side of the abdomen. A liver biopsy is a procedure in which a tissue sample is taken from the liver and examined under a microscope. There are three types of liver biopsies:  Percutaneous. A needle is used to remove a sample through an incision in your abdomen.  Laparoscopic. Several incisions are made in the abdomen. A sample is removed with the help of a tiny camera.  Transjugular. An incision is made in your neck in the area of the jugular vein. A sample is removed through a small flexible tube that is passed down the blood vessel and into your liver. Tell a health care provider about:  Any allergies you have.  All medicines you are taking, including vitamins, herbs, eye drops, creams, and over-the-counter medicines.  Any problems you or family members have had with anesthetic medicines.  Any blood disorders you have.  Any surgeries you have had.  Any medical conditions you have.  Whether you are pregnant or may be pregnant. What are the risks? Generally, this is a safe procedure. However, problems can occur and include:  Bleeding.  Infection.  Bruising.  Pain.  Injury to nearby organs or tissues, such as nerves, gallbladder, liver, or lungs. What happens before the procedure? Eating and drinking restrictions  You may be asked not to drink or eat for 6-8 hours before the liver biopsy. You may be allowed to eat a light breakfast. Talk to your health care provider about when you should stop eating and drinking. Medicines Ask your health care provider about:  Changing or stopping your regular medicines. This is especially important if you are taking diabetes medicines or blood thinners.  Taking medicines such as aspirin and ibuprofen. These medicines can thin your blood. Do not take these medicines unless your health care provider tells you to take them.  Taking over-the-counter medicines, vitamins, herbs, and  supplements. General instructions  Do not use any products that contain nicotine or tobacco, such as cigarettes and e-cigarettes. If you need help quitting, ask your health care provider.  Plan to have someone take you home from the hospital or clinic.  Plan to have a responsible adult care for you for at least 24 hours after you leave the hospital or clinic. This is important.  You may have blood or urine tests.  Ask your health care provider what steps will be taken to prevent infection. These may include: ? Removing hair at the surgery site. ? Washing skin with a germ-killing soap. ? Taking antibiotic medicine. What happens during the procedure?  An IV will be inserted into one of your veins. ? You will be given one or more of the following: ? A medicine to help you relax (sedative). ? A medicine to numb the area (local anesthetic). ? A medicine to make you fall asleep (general anesthetic).  Your health care provider will use one of the following procedures to remove samples from your liver. These procedures may vary among health care providers and hospitals. Percutaneous liver biopsy  You will lie on your back, with your right hand over your head.  A health care provider will locate your liver by tapping and pressing on the right side of your abdomen, or by using an ultrasound or CT scan.  A local anesthetic will be used to numb an area at the bottom of your last right rib.  A small incision will be made in the numbed area.  A biopsy needle will be inserted into the incision.  Several samples of liver tissue will be taken. You will be asked to hold your breath as each sample is taken.  The incision will be closed with stitches (sutures).  A bandage (dressing) may be placed over the incision. Laparoscopic liver biopsy  You will lie on your back.  Several small incisions will be made in your abdomen.  Your health care provider will pass a tiny camera through one  incision. The camera will allow the liver to be viewed on a TV monitor in the operating room.  Tools will be passed through the other incision or incisions.  Samples of the liver will be removed using the tools.  The incisions will be closed with stitches (sutures).  A bandage (dressing) may be placed over the incisions. Transjugular liver biopsy  You will lie on your back on an X-ray table, with your head turned to your left.  An area on your neck, just over your jugular vein, will be numbed.  An incision will be made in the numbed area.  A tiny tube will be inserted through the incision. The tube will be passed into the jugular vein to a blood vessel in the liver called the hepatic vein.  A dye will be injected through the tube.  X-rays will be taken. The dye will make the blood vessels in the liver light up on the X-rays.  The biopsy needle will be placed through the tube until it reaches the liver.  Samples of liver tissue will be taken with the biopsy needle.  The needle and the tube will be removed.  The incision will be closed with stitches (sutures).  A bandage (dressing) may be placed over the incision. What happens after the procedure?  Your blood pressure, heart rate, breathing rate, and blood oxygen level will be monitored until you leave the hospital or clinic.  You will be asked to rest quietly for 2-4 hours or longer.  You will be closely monitored for bleeding from the biopsy site.  You may be allowed to go home when the medicines have worn off and you can walk, drink, eat, and use the bathroom. Summary  A liver biopsy is a procedure in which a tissue sample is taken from the liver and examined under a microscope.  This is a safe procedure, but problems can occur, including bleeding, infection, pain, or injury to nearby organs or tissues.  Ask your health care provider about changing or stopping your regular medicines.  Plan to have someone take you  home from the hospital or clinic and to be with you for 24 hours after the procedure. This information is not intended to replace advice given to you by your health care provider. Make sure you discuss any questions you have with your health care provider. Document Revised: 12/25/2017 Document Reviewed: 12/25/2017 Elsevier Patient Education  [place]. Moderate Conscious Sedation, Adult Sedation is the use of medicines to promote relaxation and relieve discomfort and anxiety. Moderate conscious sedation is a type of sedation. Under moderate conscious sedation, you are less alert than normal, but you are still able to respond to instructions, touch, or both. Moderate conscious sedation is used during short medical and dental procedures. It is milder than deep sedation, which is a type of sedation under which you cannot be easily woken up. It is also milder than general anesthesia, which is the use of medicines to make you unconscious. Moderate conscious sedation  allows you to return to your regular activities sooner. Tell a health care provider about:  Any allergies you have.  All medicines you are taking, including vitamins, herbs, eye drops, creams, and over-the-counter medicines.  Use of steroids (by mouth or creams).  Any problems you or family members have had with sedatives and anesthetic medicines.  Any blood disorders you have.  Any surgeries you have had.  Any medical conditions you have, such as sleep apnea.  Whether you are pregnant or may be pregnant.  Any use of cigarettes, alcohol, marijuana, or street drugs. What are the risks? Generally, this is a safe procedure. However, problems may occur, including:  Getting too much medicine (oversedation).  Nausea.  Allergic reaction to medicines.  Trouble breathing. If this happens, a breathing tube may be used to help with breathing. It will be removed when you are awake and breathing on your own.  Heart  trouble.  Lung trouble. What happens before the procedure? Staying hydrated Follow instructions from your health care provider about hydration, which may include:  Up to 2 hours before the procedure - you may continue to drink clear liquids, such as water, clear fruit juice, black coffee, and plain tea. Eating and drinking restrictions Follow instructions from your health care provider about eating and drinking, which may include:  8 hours before the procedure - stop eating heavy meals or foods such as meat, fried foods, or fatty foods.  6 hours before the procedure - stop eating light meals or foods, such as toast or cereal.  6 hours before the procedure - stop drinking milk or drinks that contain milk.  2 hours before the procedure - stop drinking clear liquids. Medicine Ask your health care provider about:  Changing or stopping your regular medicines. This is especially important if you are taking diabetes medicines or blood thinners.  Taking medicines such as aspirin and ibuprofen. These medicines can thin your blood. Do not take these medicines before your procedure if your health care provider instructs you not to.  Tests and exams  You will have a physical exam.  You may have blood tests done to show: ? How well your kidneys and liver are working. ? How well your blood can clot. General instructions  Plan to have someone take you home from the hospital or clinic.  If you will be going home right after the procedure, plan to have someone with you for 24 hours. What happens during the procedure?  An IV tube will be inserted into one of your veins.  Medicine to help you relax (sedative) will be given through the IV tube.  The medical or dental procedure will be performed. What happens after the procedure?  Your blood pressure, heart rate, breathing rate, and blood oxygen level will be monitored often until the medicines you were given have worn off.  Do not drive  for 24 hours. This information is not intended to replace advice given to you by your health care provider. Make sure you discuss any questions you have with your health care provider. Document Revised: 11/27/2017 Document Reviewed: 04/05/2016 Elsevier Patient Education  2020 [employer].

## 2020-04-12 NOTE — H&P (Signed)
Chief Complaint: Patient was seen in consultation today for elevated liver enzymes  Referring Physician(s): Lewis S  Supervising Physician: Markus Daft  Patient Status: [facility] - Out-pt  History of Present Illness: Heidi Landry is a 34 y.o. female with past medical history of a fib, DM, HTN, H pylori, and NASH cirrhosis with recent  + AMA who presents to [facility] today for liver biopsy.  Patient presents today in her usual state of health.  She denies fever, chills, nausea, vomiting, abdominal pain, cough, shortness of breath, dysuria.  She has been NPO.  She does not take blood thinners despite her history of a fib.  She is agreeable to proceed today.   Past Medical History:  Diagnosis Date  . Adrenal adenoma    right  . Anxiety   . Atrial fibrillation ([place])   . Carpal tunnel syndrome   . Carpal tunnel syndrome   . Carpal tunnel syndrome, bilateral   . Chest pain   . Chronic back pain   . Degenerative disc disease   . Edema   . Gestational diabetes mellitus, antepartum   . Gestational diabetes mellitus, class A2/B 08/08/2015   Early 2hr: 84/186/117 @ 12wks   A1/B  Notified & dietician referral ordered 08/08/15   . Hypertension   . IBS (irritable bowel syndrome)   . Panic attacks   . Placental abruption in third trimester 12/07/2015  . Post partum depression   . Sleep apnea   . Spondylosis   . Tendonitis   . Thoracic spine fracture ([place]) 04/13/2012  . Varicose veins of left lower extremity with complications 40/98/1191    Past Surgical History:  Procedure Laterality Date  . CESAREAN SECTION N/A 12/08/2015   Procedure: CESAREAN SECTION;  Surgeon: Jonnie Kind, MD;  Location: [facility];  Service: Obstetrics;  Laterality: N/A;  . CHOLECYSTECTOMY    . KNEE SURGERY Right [facility]/29/2019   [facility] [place], [place]; s/p fall and deel leg laceration    Allergies: Patient has no known allergies.  Medications: Prior to Admission  medications   Medication Sig Start Date End Date Taking? Authorizing Provider  albuterol (PROVENTIL HFA;VENTOLIN HFA) 108 (90 Base) MCG/ACT inhaler Inhale 2 puffs into the lungs every 6 (six) hours as needed for wheezing or shortness of breath. Patient not taking: Reported on 04/10/2020 10/13/17  Yes Terald Sleeper, PA-C  amoxicillin (AMOXIL) 500 MG capsule Take 2 capsules (1,000 mg total) by mouth 2 (two) times daily for 14 days. 04/06/20 04/20/20 Yes Rakes, Connye Burkitt, FNP  atorvastatin (LIPITOR) 20 MG tablet Take 1 tablet by mouth once daily 03/26/20  Yes Hawks, Christy A, FNP  buPROPion (WELLBUTRIN SR) 150 MG 12 hr tablet Take 1 tablet by mouth twice daily Patient taking differently: Take 300 mg by mouth daily.  02/14/20  Yes Rakes, Connye Burkitt, FNP  BYDUREON 2 MG PEN INJECT 2MG SUBCUTANEOUSLY ONCE A WEEK Patient taking differently: Inject 2 mg into the skin every Saturday. INJECT 2MG SUBCUTANEOUSLY ONCE A WEEK 02/27/20  Yes Rakes, Connye Burkitt, FNP  clarithromycin (BIAXIN) 500 MG tablet Take 1 tablet (500 mg total) by mouth 2 (two) times daily for 14 days. 04/06/20 04/20/20 Yes Rakes, Connye Burkitt, FNP  cyclobenzaprine (FLEXERIL) 10 MG tablet Take 1 tablet by mouth three times daily as needed for muscle spasm 02/14/20  Yes Rakes, Connye Burkitt, FNP  EQ ALLERGY RELIEF, CETIRIZINE, 10 MG tablet Take 1 tablet by mouth once daily Patient taking differently:  Take 10 mg by mouth daily.  12/28/19  Yes Rakes, Connye Burkitt, FNP  fluticasone [facility]) 50 MCG/ACT nasal spray Use 2 spray(s) in each nostril once daily Patient not taking: No sig reported 02/24/20  Yes Rakes, Connye Burkitt, FNP  hydrochlorothiazide (HYDRODIURIL) 12.5 MG tablet Take 1 tablet by mouth once daily 03/12/20  Yes Rakes, Connye Burkitt, FNP  linaclotide [facility]) 72 MCG capsule Take 1 capsule (72 mcg total) by mouth daily before breakfast. 01/30/20  Yes Rakes, Connye Burkitt, FNP  metFORMIN (GLUCOPHAGE-XR) 500 MG 24 hr tablet Take 2 tablets (1,000 mg total) by mouth daily. 01/30/20 04/29/20 Yes  Rakes, Connye Burkitt, FNP  metoprolol succinate (TOPROL-XL) 25 MG 24 hr tablet TAKE 1 TABLET BY MOUTH ONCE DAILY WITH MEALS OR  IMMEDIATELY  FOLLOWING 02/24/20  Yes Rakes, Connye Burkitt, FNP  Multiple Vitamin (MULTIVITAMIN WITH MINERALS) TABS tablet Take 1 tablet by mouth daily. womens   Yes [provider]  norethindrone (MICRONOR) 0.35 MG tablet Take 1 tablet by mouth once daily Patient taking differently: Take 1 tablet by mouth daily.  03/26/20  Yes Florian Buff, MD  omeprazole (PRILOSEC) 20 MG capsule Take 1 capsule (20 mg total) by mouth 2 (two) times daily before a meal for 14 days. 04/06/20 04/20/20 Yes Rakes, Connye Burkitt, FNP  Probiotic Product (PROBIOTIC DAILY PO) Take 1 capsule by mouth daily. Women's care   Yes [provider]  rOPINIRole (REQUIP) 0.5 MG tablet Take 0.5 mg by mouth at bedtime.   Yes [provider]  sertraline (ZOLOFT) 100 MG tablet Take 200 mg by mouth daily.   Yes [provider]  spironolactone (ALDACTONE) 100 MG tablet Take 1 tablet (100 mg total) by mouth daily. 11/09/19  Yes Rakes, Connye Burkitt, FNP  acetaminophen-codeine (TYLENOL #3) 300-30 MG tablet Take 2 tablets by mouth every 4 (four) hours as needed for moderate pain. Patient not taking: Reported on 04/10/2020 12/07/19   Jonnie Kind, MD  bevacizumab (AVASTIN) 400 MG/16ML SOLN Inject into the vein once. Every 8 weeks injected into right eye.    [provider]  podofilox (CONDYLOX) 0.5 % gel Apply topically 2 (two) times daily. 08/04/17   Florian Buff, MD     Family History  Problem Relation Age of Onset  . Fibromyalgia Mother   . Carpal tunnel syndrome Mother   . Hernia Father   . Lung cancer Father   . Colon cancer Father 42       partial colectomy  . Liver disease Father        Fatty liver; ? ETOH-related  . Hypertension Father   . Diabetes Maternal Grandmother   . Hypertension Maternal Grandmother   . Ovarian cancer Maternal Grandmother   . Cancer Paternal Uncle   .  Brain cancer Paternal Uncle   . Throat cancer Paternal Uncle   . Heart attack Maternal Grandfather   . Hyperlipidemia Paternal Grandmother   . Hypertension Paternal Grandmother   . Lung cancer Paternal Grandmother   . Thyroid disease Paternal Aunt     Social History   Socioeconomic History  . Marital status: Married    Spouse name: Not on file  . Number of children: 1  . Years of education: 60  . Highest education level: Not on file  Occupational History  . Occupation: disabled-SSI    Employer: [employer]  Tobacco Use  . Smoking status: Current Every Day Smoker    Packs/day: 0.50    Years: 9.00  Pack years: 4.50    Types: Cigarettes    Start date: 05/25/2006  . Smokeless tobacco: Never Used  Substance and Sexual Activity  . Alcohol use: Not Currently    Alcohol/week: 0.0 standard drinks    Comment: rare  . Drug use: No  . Sexual activity: Yes    Birth control/protection: Pill  Other Topics Concern  . Not on file  Social History Narrative  . Not on file   Social Determinants of Health   Financial Resource Strain:   . Difficulty of Paying Living Expenses:   Food Insecurity:   . Worried About Charity fundraiser in the Last Year:   . Arboriculturist in the Last Year:   Transportation Needs:   . Film/video editor (Medical):   Marland Kitchen Lack of Transportation (Non-Medical):   Physical Activity:   . Days of Exercise per Week:   . Minutes of Exercise per Session:   Stress:   . Feeling of Stress :   Social Connections:   . Frequency of Communication with Friends and Family:   . Frequency of Social Gatherings with Friends and Family:   . Attends Religious Services:   . Active Member of Clubs or Organizations:   . Attends Archivist Meetings:   Marland Kitchen Marital Status:      Review of Systems: A 12 point ROS discussed and pertinent positives are indicated in the HPI above.  All other systems are negative.  Review of Systems  Constitutional: Negative  for fatigue and fever.  Respiratory: Negative for cough and shortness of breath.   Cardiovascular: Negative for chest pain.  Gastrointestinal: Negative for abdominal pain, nausea and vomiting.  Genitourinary: Negative for dysuria.  Musculoskeletal: Negative for back pain.  Neurological: Negative for facial asymmetry and headaches.  Hematological: Negative for adenopathy.  Psychiatric/Behavioral: Negative for behavioral problems and confusion.    Vital Signs: BP 129/61   Pulse 87   Temp 98.3 F (36.8 C) (Oral)   Resp 18   Ht 5' 3"  (1.6 m)   Wt (!) 325 lb (147.4 kg)   LMP 04/11/2020   SpO2 96%   BMI 57.57 kg/m   Physical Exam Vitals and nursing note reviewed.  Constitutional:      General: She is not in acute distress.    Appearance: Normal appearance. She is not ill-appearing.  HENT:     Mouth/Throat:     Mouth: Mucous membranes are moist.     Pharynx: Oropharynx is clear.  Cardiovascular:     Rate and Rhythm: Normal rate and regular rhythm.  Pulmonary:     Effort: Pulmonary effort is normal.     Breath sounds: Normal breath sounds.  Abdominal:     General: Abdomen is flat. There is no distension.     Palpations: Abdomen is soft.     Tenderness: There is no abdominal tenderness.  Skin:    General: Skin is warm and dry.  Neurological:     General: No focal deficit present.     Mental Status: She is alert and oriented to person, place, and time. Mental status is at baseline.  Psychiatric:        Mood and Affect: Mood normal.        Behavior: Behavior normal.        Thought Content: Thought content normal.        Judgment: Judgment normal.      MD Evaluation Airway: WNL Heart: WNL Abdomen: WNL Chest/ Lungs:  WNL ASA  Classification: 3 Mallampati/Airway Score: Three   Imaging: No results found.  Labs:  CBC: Recent Labs    10/28/19 1126 01/30/20 1257 02/08/20 1422 04/11/20 1115  WBC 12.0* 14.0* 13.5* 13.7*  HGB 12.8 12.8 12.4 12.6  HCT 39.9 40.9  39.4 40.5  PLT 224 221 202 232    COAGS: No results for input(s): INR, APTT in the last 8760 hours.  BMP: Recent Labs    10/24/19 0851 10/28/19 1126 01/30/20 1257 04/11/20 1115  NA  --  141 141 138  K  --  4.3 4.6 4.2  CL  --  98 97 99  CO2  --  28 27 29   GLUCOSE  --  97 82 80  BUN  --  10 14 15   CALCIUM  --  9.2 9.1 9.1  CREATININE 0.77 0.72 0.72 0.53  GFRNONAA >60 110 110 >60  GFRAA >60 126 126 >60    LIVER FUNCTION TESTS: Recent Labs    01/30/20 1212 01/30/20 1257 02/08/20 1422 04/11/20 1115  BILITOT 0.4 0.4 0.4 0.7  AST 18 17 20 21   ALT 24 26 22 26   ALKPHOS 126* 122* 125*  122* 102  PROT 7.2 7.0 7.1 7.7  ALBUMIN 4.5 4.3 4.5 4.0    TUMOR MARKERS: No results for input(s): AFPTM, CEA, CA199, CHROMGRNA in the last 8760 hours.  Assessment and Plan: Patient with past medical history of NASH cirrhosis, elevated LFTs presents with recent finding of + AMA.  IR consulted for liver biopsy at the request of Neil Crouch PA-C to rule out primary biliary cirrhosis. Case reviewed by Dr. Anselm Pancoast who approves patient for procedure.  Patient presents today in their usual state of health.  She has been NPO and is not currently on blood thinners.   Risks and benefits was discussed with the patient and/or patient's family including, but not limited to bleeding, infection, damage to adjacent structures or low yield requiring additional tests.  All of the questions were answered and there is agreement to proceed.  Consent signed and in chart.  Thank you for this interesting consult.  I greatly enjoyed meeting Maja Mccaffery and look forward to participating in their care.  A copy of this report was sent to the requesting provider on this date.  Electronically Signed: Docia Barrier, PA 04/12/2020, 7:45 AM   I spent a total of  30 Minutes   in face to face in clinical consultation, greater than 50% of which was counseling/coordinating care for elevated liver  enzymes, NASH cirrhosis.

## 2020-04-12 NOTE — Procedures (Signed)
Interventional Radiology Procedure:   Indications: History of abnormal liver enzymes  Procedure: US guided liver biopsy  Findings: 3 cores from right hepatic lobe.  Gelfoam slurry injected after biopsies.  Complications: None     EBL: less than 10 ml  Plan: Bedrest 3 hours   Alekzander Cardell R. Anselm Pancoast, MD  Pager: [phone]

## 2020-04-18 ENCOUNTER — Telehealth: Payer: Self-pay | Admitting: Internal Medicine

## 2020-04-18 NOTE — Telephone Encounter (Signed)
I don't see the liver biopsy results in Epic. Is it back yet?  Patient may be seeing results from her flow cytometry that was ordered by Dr. Delton Coombes.

## 2020-04-18 NOTE — Telephone Encounter (Signed)
Spoke with pt. Pt is inquiring about her imaging results. Please advise.

## 2020-04-18 NOTE — Telephone Encounter (Signed)
Pt said her liver biopsy results were on MyChart and she doesn't understand the results and wanted the nurse to go over them with her. [phone]

## 2020-04-18 NOTE — Telephone Encounter (Signed)
Spoke with pt. Pt is aware that we will contact her when results are in.

## 2020-04-20 LAB — CALR + JAK2 E12-15 + MPL (REFLEXED)

## 2020-04-20 LAB — JAK2 V617F, W REFLEX TO CALR/E12/MPL

## 2020-04-23 ENCOUNTER — Other Ambulatory Visit: Payer: Self-pay | Admitting: Obstetrics & Gynecology

## 2020-04-23 LAB — BCR-ABL1 FISH
Cells Analyzed: 200
Cells Counted: 200

## 2020-04-24 LAB — SURGICAL PATHOLOGY

## 2020-04-25 ENCOUNTER — Telehealth: Payer: Self-pay | Admitting: Internal Medicine

## 2020-04-25 LAB — FLOW CYTOMETRY

## 2020-04-25 NOTE — Telephone Encounter (Signed)
Pt said she had a liver biopsy done recently and came back negative. Then she had additional tests done and was told she was in the early stages of liver disease. She wants someone to explain it to her and answer her questions. [phone]

## 2020-04-25 NOTE — Telephone Encounter (Signed)
I spoke with pt. Pt would like to schedule a time to either come in for an ov or do a telephone visit. Pt says Dr. 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 Pt says she is aware that the liver bx came back negative. Pt has several questions if she does have PBC. Would you prefer pt to come in face to face or do a virtual visit?

## 2020-04-25 NOTE — Telephone Encounter (Signed)
Either is fine. But I would like her to have a dedicated visit of some sort so that we can allow plenty of time to answer all of her questions.   I'm not sure why she keeps saying her liver bx came back negative. I just got the result yesterday and have not addressed yet. But there is concern for early [place].

## 2020-04-26 NOTE — Telephone Encounter (Addendum)
See if patient can do an appt at 10:30 on May4th. If she can, please move my current patient to Anna's schedule for the same time, she has an opening on May 4th at 10:30 and put Ms. Minturn down for me on may 4th at 10:30.

## 2020-04-26 NOTE — Telephone Encounter (Signed)
Spoke with pt. LSL, please look at your schedule for 05/23/20. I scheduled an apt for pt 8:30 and your next apt is 9:30. I wanted to make sure you don't run behind. Pt states she has so many questions to discuss. Pt isn't happy with waiting to 05/23/20 and at this time, your schedule is booked. We can place pt on a cancellation list. Please advise if you feel you need less time with this pt and I can adjust the schedule back.

## 2020-04-26 NOTE — Telephone Encounter (Signed)
Lmom, waiting on a return call.  

## 2020-04-27 NOTE — Telephone Encounter (Signed)
Lmom, waiting on a return call.  

## 2020-04-27 NOTE — Telephone Encounter (Signed)
Spoke with pt. Apt was changed ok per LSL. Other apt moved to AB schedule per LSL.

## 2020-04-30 ENCOUNTER — Ambulatory Visit: Payer: Medicaid Other | Admitting: Family Medicine

## 2020-04-30 ENCOUNTER — Other Ambulatory Visit: Payer: Self-pay

## 2020-04-30 ENCOUNTER — Encounter: Payer: Self-pay | Admitting: Family

## 2020-04-30 ENCOUNTER — Ambulatory Visit: Payer: Medicaid Other | Admitting: Family

## 2020-04-30 VITALS — BP 137/82 | HR 89 | Temp 98.4°F | Ht 63.0 in | Wt 322.6 lb

## 2020-04-30 DIAGNOSIS — F411 Generalized anxiety disorder: Secondary | ICD-10-CM

## 2020-04-30 DIAGNOSIS — E119 Type 2 diabetes mellitus without complications: Secondary | ICD-10-CM | POA: Diagnosis not present

## 2020-04-30 DIAGNOSIS — R1013 Epigastric pain: Secondary | ICD-10-CM

## 2020-04-30 DIAGNOSIS — E11319 Type 2 diabetes mellitus with unspecified diabetic retinopathy without macular edema: Secondary | ICD-10-CM

## 2020-04-30 DIAGNOSIS — M545 Low back pain, unspecified: Secondary | ICD-10-CM

## 2020-04-30 DIAGNOSIS — F339 Major depressive disorder, recurrent, unspecified: Secondary | ICD-10-CM | POA: Diagnosis not present

## 2020-04-30 DIAGNOSIS — G4733 Obstructive sleep apnea (adult) (pediatric): Secondary | ICD-10-CM | POA: Diagnosis not present

## 2020-04-30 DIAGNOSIS — E785 Hyperlipidemia, unspecified: Secondary | ICD-10-CM

## 2020-04-30 DIAGNOSIS — E1159 Type 2 diabetes mellitus with other circulatory complications: Secondary | ICD-10-CM

## 2020-04-30 DIAGNOSIS — E781 Pure hyperglyceridemia: Secondary | ICD-10-CM

## 2020-04-30 DIAGNOSIS — M5442 Lumbago with sciatica, left side: Secondary | ICD-10-CM

## 2020-04-30 DIAGNOSIS — M5441 Lumbago with sciatica, right side: Secondary | ICD-10-CM

## 2020-04-30 DIAGNOSIS — I10 Essential (primary) hypertension: Secondary | ICD-10-CM

## 2020-04-30 DIAGNOSIS — G8929 Other chronic pain: Secondary | ICD-10-CM

## 2020-04-30 DIAGNOSIS — K582 Mixed irritable bowel syndrome: Secondary | ICD-10-CM

## 2020-04-30 DIAGNOSIS — H66003 Acute suppurative otitis media without spontaneous rupture of ear drum, bilateral: Secondary | ICD-10-CM | POA: Diagnosis not present

## 2020-04-30 DIAGNOSIS — J40 Bronchitis, not specified as acute or chronic: Secondary | ICD-10-CM | POA: Diagnosis not present

## 2020-04-30 DIAGNOSIS — Z6841 Body Mass Index (BMI) 40.0 and over, adult: Secondary | ICD-10-CM

## 2020-04-30 DIAGNOSIS — F17200 Nicotine dependence, unspecified, uncomplicated: Secondary | ICD-10-CM

## 2020-04-30 DIAGNOSIS — E1169 Type 2 diabetes mellitus with other specified complication: Secondary | ICD-10-CM

## 2020-04-30 DIAGNOSIS — K219 Gastro-esophageal reflux disease without esophagitis: Secondary | ICD-10-CM

## 2020-04-30 LAB — BAYER DCA HB A1C WAIVED: HB A1C (BAYER DCA - WAIVED): 6.7 % (ref <7.0)

## 2020-04-30 MED ORDER — ALBUTEROL SULFATE HFA 108 (90 BASE) MCG/ACT IN AERS: 2.0000 | INHALATION_SPRAY | Freq: Four times a day (QID) | RESPIRATORY_TRACT | 3 refills | Status: DC | PRN

## 2020-04-30 MED ORDER — BYDUREON 2 MG ~~LOC~~ PEN: 2.0000 mg | PEN_INJECTOR | SUBCUTANEOUS | 6 refills | Status: DC

## 2020-04-30 MED ORDER — GABAPENTIN 100 MG PO CAPS: 100.0000 mg | ORAL_CAPSULE | Freq: Three times a day (TID) | ORAL | 3 refills | Status: DC

## 2020-04-30 MED ORDER — SERTRALINE HCL 100 MG PO TABS: 200.0000 mg | ORAL_TABLET | Freq: Every day | ORAL | 2 refills | Status: DC

## 2020-04-30 MED ORDER — FLUTICASONE PROPIONATE 50 MCG/ACT NA SUSP: NASAL | 1 refills | Status: DC

## 2020-04-30 MED ORDER — METFORMIN HCL ER 500 MG PO TB24: 1000.0000 mg | ORAL_TABLET | Freq: Every day | ORAL | 1 refills | Status: DC

## 2020-04-30 MED ORDER — SPIRONOLACTONE 100 MG PO TABS: 100.0000 mg | ORAL_TABLET | Freq: Every day | ORAL | 1 refills | Status: DC

## 2020-04-30 MED ORDER — METOPROLOL SUCCINATE ER 25 MG PO TB24: ORAL_TABLET | ORAL | 1 refills | Status: DC

## 2020-04-30 MED ORDER — DICLOFENAC SODIUM 75 MG PO TBEC: 75.0000 mg | DELAYED_RELEASE_TABLET | Freq: Two times a day (BID) | ORAL | 3 refills | Status: DC

## 2020-04-30 MED ORDER — OMEPRAZOLE 20 MG PO CPDR: 20.0000 mg | DELAYED_RELEASE_CAPSULE | Freq: Two times a day (BID) | ORAL | 0 refills | Status: DC

## 2020-04-30 MED ORDER — LINACLOTIDE 72 MCG PO CAPS: 72.0000 ug | ORAL_CAPSULE | Freq: Every day | ORAL | 3 refills | Status: DC

## 2020-04-30 MED ORDER — BUPROPION HCL ER (SR) 150 MG PO TB12: 300.0000 mg | ORAL_TABLET | Freq: Every day | ORAL | 2 refills | Status: DC

## 2020-04-30 MED ORDER — HYDROCHLOROTHIAZIDE 12.5 MG PO TABS: 12.5000 mg | ORAL_TABLET | Freq: Every day | ORAL | 2 refills | Status: DC

## 2020-04-30 MED ORDER — ROPINIROLE HCL 0.5 MG PO TABS: 0.5000 mg | ORAL_TABLET | Freq: Every day | ORAL | 2 refills | Status: DC

## 2020-04-30 MED ORDER — CYCLOBENZAPRINE HCL 10 MG PO TABS: 10.0000 mg | ORAL_TABLET | Freq: Three times a day (TID) | ORAL | 3 refills | Status: DC | PRN

## 2020-04-30 MED ORDER — ATORVASTATIN CALCIUM 20 MG PO TABS: 20.0000 mg | ORAL_TABLET | Freq: Every day | ORAL | 2 refills | Status: DC

## 2020-04-30 NOTE — Patient Instructions (Signed)

## 2020-04-30 NOTE — Progress Notes (Signed)
Subjective:    Patient ID: Heidi Landry, female    DOB: [birth year], 34 y.o.   MRN: [id number]  Chief Complaint  Patient presents with  . Medical Management of Chronic Issues   PT presents to the office today to establish care. 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 She is under a great deal of stress. She is in the process of getting custody of her a 55 month that is her cousins daughter.  Diabetes She presents for her follow-up diabetic visit. She has type 2 diabetes mellitus. Her disease course has been stable. Hypoglycemia symptoms include nervousness/anxiousness. Associated symptoms include fatigue. Pertinent negatives for diabetes include no blurred vision, no foot paresthesias and no visual change. There are no hypoglycemic complications. Symptoms are stable. Pertinent negatives for diabetic complications include no CVA or heart disease. Risk factors for coronary artery disease include dyslipidemia, diabetes mellitus, hypertension, sedentary lifestyle and post-menopausal. She is following a generally unhealthy diet. (Does not check BS at home) Eye exam is not current.  Hypertension This is a chronic problem. The current episode started more than 1 year ago. The problem has been resolved since onset. The problem is controlled. Associated symptoms include anxiety and malaise/fatigue. Pertinent negatives include no blurred vision, peripheral edema or shortness of breath. Risk factors for coronary artery disease include dyslipidemia, diabetes mellitus, obesity and sedentary lifestyle. The current treatment provides moderate improvement. There is no history of kidney disease or CVA.  Gastroesophageal Reflux She complains of belching and heartburn. This is a chronic problem. The current episode started more than 1 year ago. The problem occurs occasionally. The problem has been waxing and waning. Associated symptoms include fatigue. Risk factors include obesity. She has tried a  PPI for the symptoms. The treatment provided moderate relief.  Hyperlipidemia This is a chronic problem. The current episode started more than 1 year ago. The problem is controlled. Recent lipid tests were reviewed and are normal. Exacerbating diseases include obesity. Pertinent negatives include no shortness of breath. Current antihyperlipidemic treatment includes statins. The current treatment provides moderate improvement of lipids. Risk factors for coronary artery disease include diabetes mellitus, hypertension and a sedentary lifestyle.  Nicotine Dependence Presents for follow-up visit. Symptoms include fatigue and irritability. Her urge triggers include company of smokers. The symptoms have been stable. She smokes 1 pack of cigarettes per day.  Anxiety Presents for follow-up visit. Symptoms include depressed mood, excessive worry, irritability, nervous/anxious behavior and restlessness. Patient reports no shortness of breath. Symptoms occur occasionally. The severity of symptoms is moderate. The quality of sleep is good.    Depression        This is a chronic problem.  The current episode started more than 1 year ago.   The onset quality is sudden.   The problem occurs intermittently.  Associated symptoms include fatigue, helplessness, hopelessness, irritable, restlessness and sad.  Past medical history includes anxiety.   Constipation This is a chronic problem. The current episode started more than 1 year ago. The problem has been waxing and waning since onset. Risk factors include obesity. She has tried laxatives for the symptoms. The treatment provided moderate relief.  Arthritis Presents for follow-up visit. She complains of pain and stiffness. The symptoms have been stable. Affected locations include the left hip, right hip, right knee and left knee (back). Her pain is at a severity of 8/10. Associated symptoms include fatigue.  OSA Uses CPAP nightly. Stable.     Review of Systems  Constitutional: Positive for fatigue, irritability and malaise/fatigue.  Eyes: Negative for blurred vision.  Respiratory: Negative for shortness of breath.   Gastrointestinal: Positive for constipation and heartburn.  Musculoskeletal: Positive for arthritis and stiffness.  Psychiatric/Behavioral: Positive for depression. The patient is nervous/anxious.   All other systems reviewed and are negative.      Objective:   Physical Exam Vitals reviewed.  Constitutional:      General: She is irritable. She is not in acute distress.    Appearance: She is well-developed. She is obese.  HENT:     Head: Normocephalic and atraumatic.     Right Ear: Tympanic membrane normal.     Left Ear: Tympanic membrane normal.  Eyes:     Pupils: Pupils are equal, round, and reactive to light.  Neck:     Thyroid: No thyromegaly.  Cardiovascular:     Rate and Rhythm: Normal rate and regular rhythm.     Heart sounds: Normal heart sounds. No murmur.  Pulmonary:     Effort: Pulmonary effort is normal. No respiratory distress.     Breath sounds: Normal breath sounds. No wheezing.  Abdominal:     General: Bowel sounds are normal. There is no distension.     Palpations: Abdomen is soft.     Tenderness: There is no abdominal tenderness.  Musculoskeletal:        General: No tenderness. Normal range of motion.     Cervical back: Normal range of motion and neck supple.  Skin:    General: Skin is warm and dry.  Neurological:     Mental Status: She is alert and oriented to person, place, and time.     Cranial Nerves: No cranial nerve deficit.     Deep Tendon Reflexes: Reflexes are normal and symmetric.  Psychiatric:        Behavior: Behavior normal.        Thought Content: Thought content normal.        Judgment: Judgment normal.       BP 137/82   Pulse 89   Temp 98.4 F (36.9 C) (Temporal)   Ht 5' 3"  (1.6 m)   Wt (!) 322 lb 9.6 oz (146.3 kg)   LMP 04/11/2020   SpO2 94%   BMI 57.15 kg/m       Assessment & Plan:  Cherine Drumgoole comes in today with chief complaint of Medical Management of Chronic Issues   Diagnosis and orders addressed:  1. Bronchitis - CMP14+EGFR - CBC with Differential/Platelet  2. Depression, recurrent (HCC)  - buPROPion (WELLBUTRIN SR) 150 MG 12 hr tablet; Take 2 tablets (300 mg total) by mouth daily.  Dispense: 180 tablet; Refill: 2 - CMP14+EGFR - CBC with Differential/Platelet  3. Epigastric pain - CMP14+EGFR - CBC with Differential/Platelet  4. Essential hypertension - hydrochlorothiazide (HYDRODIURIL) 12.5 MG tablet; Take 1 tablet (12.5 mg total) by mouth daily.  Dispense: 90 tablet; Refill: 2 - metoprolol succinate (TOPROL-XL) 25 MG 24 hr tablet; TAKE 1 TABLET BY MOUTH ONCE DAILY WITH MEALS OR  IMMEDIATELY  FOLLOWING  Dispense: 90 tablet; Refill: 1 - spironolactone (ALDACTONE) 100 MG tablet; Take 1 tablet (100 mg total) by mouth daily.  Dispense: 90 tablet; Refill: 1 - CMP14+EGFR - CBC with Differential/Platelet  5. Irritable bowel syndrome with both constipation and diarrhea - linaclotide (LINZESS) 72 MCG capsule; Take 1 capsule (72 mcg total) by mouth daily before breakfast.  Dispense: 30 capsule; Refill: 3 - CMP14+EGFR - CBC with Differential/Platelet  6. Midline low  back pain without sciatica, unspecified chronicity - cyclobenzaprine (FLEXERIL) 10 MG tablet; Take 1 tablet (10 mg total) by mouth 3 (three) times daily as needed. for muscle spams  Dispense: 90 tablet; Refill: 3 - CMP14+EGFR - CBC with Differential/Platelet  7. Non-recurrent acute suppurative otitis media of both ears without spontaneous rupture of tympanic membranes - fluticasone (FLONASE) 50 MCG/ACT nasal spray; Use 2 spray(s) in each nostril once daily  Dispense: 48 g; Refill: 1 - CMP14+EGFR - CBC with Differential/Platelet  8. 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   Dispense: 90 tablet; Refill: 2 - Microalbumin / creatinine urine ratio - CMP14+EGFR - CBC with Differential/Platelet - Bayer DCA Hb A1c Waived  9. 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 INJECT 2MG SUBCUTANEOUSLY ONCE A WEEK  Dispense: 4 each; Refill: 6 - Microalbumin / creatinine urine ratio - CMP14+EGFR - CBC with Differential/Platelet - Bayer DCA Hb A1c Waived  10. Type 2 diabetes mellitus without complication, without long-term current use of insulin (HCC) - metFORMIN (GLUCOPHAGE-XR) 500 MG 24 hr tablet; Take 2 tablets (1,000 mg total) by mouth daily.  Dispense: 180 tablet; Refill: 1 - CMP14+EGFR - CBC with Differential/Platelet  11. Hypertension associated with type 2 diabetes mellitus (HCC) - CMP14+EGFR - CBC with Differential/Platelet  12. OSA (obstructive sleep apnea) - CMP14+EGFR - CBC with Differential/Platelet  13. GERD without esophagitis - omeprazole (PRILOSEC) 20 MG capsule; Take 1 capsule (20 mg total) by mouth 2 (two) times daily before a meal for 14 days.  Dispense: 28 capsule; Refill: 0 - CMP14+EGFR - CBC with Differential/Platelet  14. Hyperlipidemia associated with type 2 diabetes mellitus (HCC) - CMP14+EGFR - CBC with Differential/Platelet  15. Generalized anxiety disorder - CMP14+EGFR - CBC with Differential/Platelet  16. Morbid obesity with BMI of 60.0-69.9, adult (HCC) - CMP14+EGFR - CBC with Differential/Platelet  17. Smoker - CMP14+EGFR - CBC with Differential/Platelet  18. Chronic bilateral low back pain with bilateral sciatica Will start gabapentin and diclofenac 75 mg BID - gabapentin (NEURONTIN) 100 MG capsule; Take 1 capsule (100 mg total) by mouth 3 (three) times daily.  Dispense: 90 capsule; Refill: 3 - diclofenac (VOLTAREN) 75  MG EC tablet; Take 1 tablet (75 mg total) by mouth 2 (two) times daily.  Dispense: 60 tablet; Refill: 3   Labs pending Health Maintenance reviewed Diet and exercise encouraged  Follow up plan: 4 months    Evelina Dun, FNP

## 2020-05-01 ENCOUNTER — Encounter: Payer: Self-pay | Admitting: Internal Medicine

## 2020-05-01 ENCOUNTER — Other Ambulatory Visit: Payer: Self-pay | Admitting: *Deleted

## 2020-05-01 ENCOUNTER — Encounter: Payer: Self-pay | Admitting: Gastroenterology

## 2020-05-01 ENCOUNTER — Ambulatory Visit (INDEPENDENT_AMBULATORY_CARE_PROVIDER_SITE_OTHER): Payer: Medicaid Other | Admitting: Gastroenterology

## 2020-05-01 DIAGNOSIS — K7581 Nonalcoholic steatohepatitis (NASH): Secondary | ICD-10-CM | POA: Diagnosis not present

## 2020-05-01 DIAGNOSIS — K743 Primary biliary cirrhosis: Secondary | ICD-10-CM

## 2020-05-01 LAB — CBC WITH DIFFERENTIAL/PLATELET
Basophils Absolute: 0.1 10*3/uL (ref 0.0–0.2)
Basos: 1 %
EOS (ABSOLUTE): 0.1 10*3/uL (ref 0.0–0.4)
Eos: 1 %
Hematocrit: 38.3 % (ref 34.0–46.6)
Hemoglobin: 12.3 g/dL (ref 11.1–15.9)
Immature Grans (Abs): 0.1 10*3/uL (ref 0.0–0.1)
Immature Granulocytes: 1 %
Lymphocytes Absolute: 3 10*3/uL (ref 0.7–3.1)
Lymphs: 24 %
MCH: 25.4 pg — ABNORMAL LOW (ref 26.6–33.0)
MCHC: 32.1 g/dL (ref 31.5–35.7)
MCV: 79 fL (ref 79–97)
Monocytes Absolute: 0.9 10*3/uL (ref 0.1–0.9)
Monocytes: 7 %
Neutrophils Absolute: 8.2 10*3/uL — ABNORMAL HIGH (ref 1.4–7.0)
Neutrophils: 66 %
Platelets: 191 10*3/uL (ref 150–450)
RBC: 4.84 x10E6/uL (ref 3.77–5.28)
RDW: 15.1 % (ref 11.7–15.4)
WBC: 12.4 10*3/uL — ABNORMAL HIGH (ref 3.4–10.8)

## 2020-05-01 LAB — MICROALBUMIN / CREATININE URINE RATIO
Creatinine, Urine: 100.8 mg/dL
Microalb/Creat Ratio: 16 mg/g creat (ref 0–29)
Microalbumin, Urine: 15.7 ug/mL

## 2020-05-01 LAB — CMP14+EGFR
ALT: 23 IU/L (ref 0–32)
AST: 20 IU/L (ref 0–40)
Albumin/Globulin Ratio: 1.7 (ref 1.2–2.2)
Albumin: 4.5 g/dL (ref 3.8–4.8)
Alkaline Phosphatase: 129 IU/L — ABNORMAL HIGH (ref 39–117)
BUN/Creatinine Ratio: 18 (ref 9–23)
BUN: 12 mg/dL (ref 6–20)
Bilirubin Total: 0.6 mg/dL (ref 0.0–1.2)
CO2: 25 mmol/L (ref 20–29)
Calcium: 9.2 mg/dL (ref 8.7–10.2)
Chloride: 100 mmol/L (ref 96–106)
Creatinine, Ser: 0.65 mg/dL (ref 0.57–1.00)
GFR calc Af Amer: 134 mL/min/{1.73_m2} (ref >59)
GFR calc non Af Amer: 116 mL/min/{1.73_m2} (ref >59)
Globulin, Total: 2.7 g/dL (ref 1.5–4.5)
Glucose: 85 mg/dL (ref 65–99)
Potassium: 4.4 mmol/L (ref 3.5–5.2)
Sodium: 143 mmol/L (ref 134–144)
Total Protein: 7.2 g/dL (ref 6.0–8.5)

## 2020-05-01 MED ORDER — URSODIOL 500 MG PO TABS: 1000.0000 mg | ORAL_TABLET | Freq: Two times a day (BID) | ORAL | 5 refills | Status: DC

## 2020-05-01 NOTE — Patient Instructions (Signed)
1. Ursodiol 1000 mg (two pills) twice daily with food. RX sent to pharmacy.  2. We will see you back here in six months or sooner if needed.  3. In the meantime, we will send you for second opinion at [facility] liver. Let me know if you don't hear from them with and appointment time (may take up to two weeks to receive).  4. Continue to try and lose weight as discussed today. If you decide you would like to go to a health and weight loss center to help you lose weight by diet, please let me know.

## 2020-05-01 NOTE — Progress Notes (Signed)
Primary Care Physician: Sharion Balloon, FNP  Primary Gastroenterologist:  Garfield Cornea, MD   Chief Complaint  Patient presents with  . wants to discuss results    had some tenderness at incision    HPI: Heidi Landry is a 34 y.o. female here for follow-up.  She was last seen in January 2021 for abnormal LFTs.  History of morbid obesity (BMI of 57), DM, hypertension, fatty liver, GERD. History of elevated mitochondrial antibodies. MRCP October 2020 with hepatomegaly with severe hepatic steatosis, small right adrenal adenoma but otherwise unremarkable.  Patient's father had colon cancer diagnosed in his early 35s, required partial colectomy.  When I last saw her she had had some intermittent right upper quadrant pain for a couple of days.  She was having some constipation but all this had improved.  We discussed weight management, she had been unable to do significant exercise regimen since severe leg injury in February 2020.  She had actually been to bariatric surgery interest meetings in the past but decided it was not for her. States at the interest meeting one of the surgeons advised Duodenal switch and this scared her away. Weight has been stable over the past 6 months. She is down 30 pounds from 2019.  Patient presents to discuss liver biopsy results today. Positive AMA of 85.9 previously.  Known hepatomegaly, severe hepatic steatosis noted on prior MRCP in October 2020. Her alk phos has been minimally elevated. Her AST/ALT have been minimally elevated in the past but normal most recently. See labs below. She is concerned about her liver. She is still trying to lose weight through diet. No abdominal pain now, had a little tenderness after her liver biopsy, lasted for couple of days. No n/v. BM regular. No melena, brbpr. No heartburn. Since we last saw her she has been seeing hematology for chronically elevated WBC count.   Recent liver biopsy on 04/12/20: A. LIVER, RIGHT LOBE,  NEEDLE CORE BIOPSY:  - Patchy portal-based inflammation with mild bile duct injury, see  comment  - Mildly active steatohepatitis (grade 1 of 3)  - Mild fibrosis (stage 1-2 of 4)   COMMENT:   Biopsy shows liver parenchyma with preserved architecture. A  mononuclear cell infiltrate, mostly mild to focally moderate in degree,  expands many of the portal tracts. This infiltrate is composed  principally of small lymphocytes and rare plasma cells. There is  evidence of focal bile duct injury with mild ductular reaction. No  granulomas or ' florid duct lesions' are identified within portal  tracts. Hepatic lobules show mild steatosis with focal ballooning  induration and associated foci of necroinflammation. Trichrome stain  shows patchy portal fibrous expansion with few fibrous septa and focal  centrilobular chicken wire-like fibrosis. Reticulin stain highlights the  normal trabecular architecture. PASD stain shows no globular inclusions  within hepatocytes. Iron stain is negative.   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 There is concurrent mildly active  steatohepatitis. Clinical correlation and patient follow-up are  suggested.     Current Outpatient Medications  Medication Sig Dispense Refill  . albuterol (VENTOLIN HFA) 108 (90 Base) MCG/ACT inhaler Inhale 2 puffs into the lungs every 6 (six) hours as needed for wheezing or shortness of breath. 8 g 3  . atorvastatin (LIPITOR) 20 MG tablet Take 1 tablet (  20 mg total) by mouth daily. 90 tablet 2  . buPROPion (WELLBUTRIN SR) 150 MG 12 hr tablet Take 2 tablets (300 mg total) by mouth daily. 180 tablet 2  . cyclobenzaprine (FLEXERIL) 10 MG tablet Take 1 tablet (10 mg total) by mouth 3 (three) times daily as needed. for muscle  spams 90 tablet 3  . diclofenac (VOLTAREN) 75 MG EC tablet Take 1 tablet (75 mg total) by mouth 2 (two) times daily. 60 tablet 3  . EQ ALLERGY RELIEF, CETIRIZINE, 10 MG tablet Take 1 tablet by mouth once daily (Patient taking differently: Take 10 mg by mouth daily. ) 90 tablet 1  . [START ON 05/05/2020] Exenatide ER (BYDUREON) 2 MG PEN Inject 2 mg into the skin every Saturday. INJECT 2MG SUBCUTANEOUSLY ONCE A WEEK 4 each 6  . fluticasone (FLONASE) 50 MCG/ACT nasal spray Use 2 spray(s) in each nostril once daily 48 g 1  . gabapentin (NEURONTIN) 100 MG capsule Take 1 capsule (100 mg total) by mouth 3 (three) times daily. 90 capsule 3  . hydrochlorothiazide (HYDRODIURIL) 12.5 MG tablet Take 1 tablet (12.5 mg total) by mouth daily. 90 tablet 2  . linaclotide (LINZESS) 72 MCG capsule Take 1 capsule (72 mcg total) by mouth daily before breakfast. 30 capsule 3  . metFORMIN (GLUCOPHAGE-XR) 500 MG 24 hr tablet Take 2 tablets (1,000 mg total) by mouth daily. 180 tablet 1  . metoprolol succinate (TOPROL-XL) 25 MG 24 hr tablet TAKE 1 TABLET BY MOUTH ONCE DAILY WITH MEALS OR  IMMEDIATELY  FOLLOWING 90 tablet 1  . Multiple Vitamin (MULTIVITAMIN WITH MINERALS) TABS tablet Take 1 tablet by mouth daily. womens    . norethindrone (MICRONOR) 0.35 MG tablet Take 1 tablet by mouth once daily 28 tablet 12  . omeprazole (PRILOSEC) 20 MG capsule Take 1 capsule (20 mg total) by mouth 2 (two) times daily before a meal for 14 days. 28 capsule 0  . podofilox (CONDYLOX) 0.5 % gel Apply topically 2 (two) times daily. (Patient taking differently: Apply topically as needed. ) 3.5 g 11  . Probiotic Product (PROBIOTIC DAILY PO) Take 1 capsule by mouth daily. Women's care    . rOPINIRole (REQUIP) 0.5 MG tablet Take 1 tablet (0.5 mg total) by mouth at bedtime. 90 tablet 2  . sertraline (ZOLOFT) 100 MG tablet Take 2 tablets (200 mg total) by mouth daily. 180 tablet 2  . spironolactone (ALDACTONE) 100 MG tablet Take 1 tablet (100 mg  total) by mouth daily. 90 tablet 1   No current facility-administered medications for this visit.    Allergies as of 05/01/2020  . (No Known Allergies)    ROS:  General: Negative for anorexia, weight loss, fever, chills, fatigue, weakness. ENT: Negative for hoarseness, difficulty swallowing , nasal congestion. CV: Negative for chest pain, angina, palpitations, dyspnea on exertion, peripheral edema.  Respiratory: Negative for dyspnea at rest, dyspnea on exertion, cough, sputum, wheezing.  GI: See history of present illness. GU:  Negative for dysuria, hematuria, urinary incontinence, urinary frequency, nocturnal urination.  Endo: Negative for unusual weight change.    Physical Examination:   BP 138/73   Pulse 92   Temp 97.8 F (36.6 C) (Temporal)   Ht _0  (1.6 m)   Wt (!) 323 lb 9.6 oz (146.8 kg)   LMP 04/11/2020 (Approximate)   BMI 57.32 kg/m   General: Well-nourished, well-developed in no acute distress.  Eyes: No icterus. Mouth:masked Extremities: No lower extremity edema.   Neuro: Alert and oriented  x 4   Skin: Warm and dry, no jaundice.   Psych: Alert and cooperative, normal mood and affect.  Labs:  Lab Results  Component Value Date   WBC 12.4 (H) 04/30/2020   HGB 12.3 04/30/2020   HCT 38.3 04/30/2020   MCV 79 04/30/2020   PLT 191 04/30/2020   Lab Results  Component Value Date   CREATININE 0.65 04/30/2020   BUN 12 04/30/2020   NA 143 04/30/2020   K 4.4 04/30/2020   CL 100 04/30/2020   CO2 25 04/30/2020     GGT 18  Component     Latest Ref Rng & Units 10/28/2019 01/30/2020 01/30/2020 02/08/2020         12:12 PM 12:57 PM  2:22 PM  Albumin     3.8 - 4.8 g/dL 4.3 4.5 4.3 4.5  Globulin, Total     1.5 - 4.5 g/dL 2.6  2.7   Albumin/Globulin Ratio     1.2 - 2.2 1.7  1.6   Total Bilirubin     0.0 - 1.2 mg/dL 0.5 0.4 0.4 0.4  Alkaline Phosphatase     39 - 117 IU/L 134 (H) 126 (H) 122 (H) 122 (H)  AST     0 - 40 IU/L 32 _0 ALT     0 - 32 IU/L  39 (H) _1 Component     Latest Ref Rng & Units 02/08/2020 04/11/2020 04/30/2020         2:22 PM    Albumin     3.8 - 4.8 g/dL  4.0 4.5  Globulin, Total     1.5 - 4.5 g/dL   2.7  Albumin/Globulin Ratio     1.2 - 2.2   1.7  Total Bilirubin     0.0 - 1.2 mg/dL  0.7 0.6  Alkaline Phosphatase     39 - 117 IU/L 125 (H) 102 129 (H)  AST     0 - 40 IU/L  21 20  ALT     0 - 32 IU/L  26 23   Component     Latest Ref Rng & Units 12/20/2018  Mitochondrial Ab     0.0 - 20.0 Units 85.9 (H)   Labs from December 2019: Anti-smooth muscle antibody negative, hepatitis C antibody negative, hepatitis B surface antibody negative, hepatitis C core antibody total negative, hepatitis B surface antigen negative, hepatitis A total antibody negative, ferritin 124  Imaging Studies: US BIOPSY (LIVER)  Result Date: 04/12/2020 INDICATION: 34 year old with abnormal liver function tests and positive for antimitochondrial antibody. EXAM: ULTRASOUND-GUIDED RANDOM LIVER BIOPSY MEDICATIONS: None. ANESTHESIA/SEDATION: Moderate (conscious) sedation was employed during this procedure. A total of Versed 1.5 mg and Fentanyl 50 mcg was administered intravenously. Moderate Sedation Time: 18 minutes minutes. The patient's level of consciousness and vital signs were monitored continuously by radiology nursing throughout the procedure under my direct supervision. FLUOROSCOPY TIME:  None COMPLICATIONS: None immediate. PROCEDURE: Informed written consent was obtained from the patient after a thorough discussion of the procedural risks, benefits and alternatives. All questions were addressed. Maximal Sterile Barrier Technique was utilized including caps, mask, sterile gowns, sterile gloves, sterile drape, hand hygiene and skin antiseptic. A timeout was performed prior to the initiation of the procedure. Liver was evaluated with ultrasound. The right hepatic lobe was targeted for biopsy. Right side of the abdomen was prepped with  chlorhexidine and sterile field was created. Skin was anesthetized with 1% lidocaine. Small incision was made.  Using ultrasound guidance, 17 gauge needle was directed into the right hepatic lobe. Total of 3 core biopsies were obtained with an 18 gauge core device. Gel-Foam slurry was injected through the 17 gauge needle as it was removed. Bandage placed over the puncture site. FINDINGS: Needle position confirmed within the liver. Three adequate core biopsies were obtained. Specimens placed in formalin. No significant bleeding or hematoma formation following the core biopsies. IMPRESSION: Ultrasound-guided core biopsy of the right hepatic lobe. Electronically Signed   By: Markus Daft M.D.   On: 04/12/2020 16:26    Assessment/plan:  Pleasant 34 year old female with morbid obesity, diabetes presenting for follow-up.  Previous work-up showing hepatomegaly, severe hepatic steatosis, mildly elevated alkaline phosphatase in the 120 range, intermittently mildly elevated AST/ALT, elevated AMA.  MRCP with hepatomegaly and severe hepatic steatosis but no biliary duct dilation.  Given positive AMA and only mildly elevated alkaline phosphatase, we did send her for a liver biopsy.  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  Also noted to have mildly active steatohepatitis (grade 1 of 3), mild fibrosis (stage 1-2 of 4).   Discussed at length with patient.  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  We will start her on weight-based ursodiol, 1000 mg twice daily with food.  Encouraged weight loss, previously she declined bariatric surgery consideration as well as referral to [facility] for non-surgical management. Plans to continue weight loss measures on her own. Discussed if she needed liver transplant in the distant future, her weight would  disqualify her.    Given young age 48, liver biopsy findings with involvement of stage I-2 fibrosis, NASH/PBC would encourage her to be seen at [facility] for second opinion regarding diagnosis and management.   We will see patient in follow up in six months or sooner if needed.

## 2020-05-03 ENCOUNTER — Inpatient Hospital Stay (HOSPITAL_COMMUNITY): Payer: Medicaid Other | Attending: Hematology | Admitting: Hematology

## 2020-05-03 ENCOUNTER — Telehealth: Payer: Self-pay | Admitting: Family

## 2020-05-03 ENCOUNTER — Other Ambulatory Visit: Payer: Self-pay

## 2020-05-03 ENCOUNTER — Other Ambulatory Visit: Payer: Self-pay | Admitting: Family

## 2020-05-03 DIAGNOSIS — D72829 Elevated white blood cell count, unspecified: Secondary | ICD-10-CM | POA: Diagnosis not present

## 2020-05-03 MED ORDER — EPOETIN ALFA-EPBX 10000 UNIT/ML IJ SOLN
INTRAMUSCULAR | Status: AC
  Filled 2020-05-03: qty 1

## 2020-05-03 NOTE — Telephone Encounter (Signed)
Please see MyChart note. I am unsure if she wants the letter stating she is able to care for the child or not. During our visit, she wasn't sure if she could or not.

## 2020-05-03 NOTE — Telephone Encounter (Signed)
Please advise she sent a my chart message on 05/04 and 05/05. And calling back today checking on letter again today. Please advise

## 2020-05-03 NOTE — Telephone Encounter (Signed)
Being done through mychart right now.

## 2020-05-03 NOTE — Progress Notes (Signed)
Virtual Visit via Telephone Note  I connected with Heidi Landry on 05/03/20 at  3:30 PM EDT by telephone and verified that I am speaking with the correct person using two identifiers.   I discussed the limitations, risks, security and privacy concerns of performing an evaluation and management service by telephone and the availability of in person appointments. I also discussed with the patient that there may be a patient responsible charge related to this service. The patient expressed understanding and agreed to proceed.   History of Present Illness: She was evaluated in our office on 04/11/2020 for leukocytosis.  She denied any fevers or recurrent infections.  Most of the time it was neutrophilic leukocytosis and occasionally lymphocytes.  She had leukocytosis since 2008.  She is also current active smoker, 1 pack/day for 13 years.    Observations/Objective: Denies any fevers, night sweats or weight loss.  No recurrent infections.  No systemic steroids.  Assessment and Plan:  1.  Leukocytosis: -We reviewed the results from 04/11/2020.  White count was elevated at 13.7.  Hemoglobin and platelet count was normal.  Differential showed 67% neutrophils. -JAK2 V617F and reflex testing was negative.  BCR/ABL was negative. -Flow cytometry did not reveal any lymphoproliferative disorders.  ANA and rheumatoid factor was negative. -She does not have any palpable lymphadenopathy. -Leukocytosis thought to be reactive from smoking. -We will plan to see her back in 3 to 4 months with repeat labs.  If there is stable, we will switch her to 6 months with the same at that time.  If there is any significant changes, will consider bone marrow biopsy.   Follow Up Instructions: RTC 4 months with labs.   I discussed the assessment and treatment plan with the patient. The patient was provided an opportunity to ask questions and all were answered. The patient agreed with the plan and demonstrated an  understanding of the instructions.   The patient was advised to call back or seek an in-person evaluation if the symptoms worsen or if the condition fails to improve as anticipated.  I provided 11 minutes of non-face-to-face time during this encounter.   Derek Jack, MD

## 2020-05-07 ENCOUNTER — Telehealth: Payer: Self-pay | Admitting: Family

## 2020-05-07 ENCOUNTER — Telehealth: Payer: Self-pay

## 2020-05-07 NOTE — Telephone Encounter (Signed)
Received fax from [facility] with new referral form. [facility] requested for referral to be resent with new referral form. Referral refaxed.

## 2020-05-07 NOTE — Telephone Encounter (Signed)
Lattie Haw called from [employer] requesting to briefly speak to [place] regarding patient.

## 2020-05-09 ENCOUNTER — Institutional Professional Consult (permissible substitution): Payer: Medicaid Other | Admitting: Pulmonary Disease

## 2020-05-09 DIAGNOSIS — M546 Pain in thoracic spine: Secondary | ICD-10-CM | POA: Diagnosis not present

## 2020-05-09 DIAGNOSIS — M545 Low back pain: Secondary | ICD-10-CM | POA: Diagnosis not present

## 2020-05-09 DIAGNOSIS — M9902 Segmental and somatic dysfunction of thoracic region: Secondary | ICD-10-CM | POA: Diagnosis not present

## 2020-05-09 DIAGNOSIS — M9903 Segmental and somatic dysfunction of lumbar region: Secondary | ICD-10-CM | POA: Diagnosis not present

## 2020-05-09 DIAGNOSIS — M9901 Segmental and somatic dysfunction of cervical region: Secondary | ICD-10-CM | POA: Diagnosis not present

## 2020-05-09 DIAGNOSIS — M9905 Segmental and somatic dysfunction of pelvic region: Secondary | ICD-10-CM | POA: Diagnosis not present

## 2020-05-10 DIAGNOSIS — G4733 Obstructive sleep apnea (adult) (pediatric): Secondary | ICD-10-CM | POA: Diagnosis not present

## 2020-05-11 ENCOUNTER — Telehealth (INDEPENDENT_AMBULATORY_CARE_PROVIDER_SITE_OTHER): Payer: Medicaid Other | Admitting: Family

## 2020-05-11 ENCOUNTER — Encounter: Payer: Self-pay | Admitting: Family

## 2020-05-11 DIAGNOSIS — J301 Allergic rhinitis due to pollen: Secondary | ICD-10-CM | POA: Diagnosis not present

## 2020-05-11 MED ORDER — FEXOFENADINE HCL 180 MG PO TABS: 180.0000 mg | ORAL_TABLET | Freq: Every day | ORAL | 2 refills | Status: DC

## 2020-05-11 NOTE — Progress Notes (Signed)
   Virtual Visit via telephone Note Due to COVID-19 pandemic this visit was conducted virtually. This visit type was conducted due to national recommendations for restrictions regarding the COVID-19 Pandemic (e.g. social distancing, sheltering in place) in an effort to limit this patient's exposure and mitigate transmission in our community. All issues noted in this document were discussed and addressed.  A physical exam was not performed with this format.  I connected with Heidi Landry on 05/11/20 at 4:20 pm by telephone and verified that I am speaking with the correct person using two identifiers. Heidi Landry is currently located at car  and son and friend is currently with her  during visit. The provider, Evelina Dun, FNP is located in their office at time of visit.  I discussed the limitations, risks, security and privacy concerns of performing an evaluation and management service by telephone and the availability of in person appointments. I also discussed with the patient that there may be a patient responsible charge related to this service. The patient expressed understanding and agreed to proceed.   History and Present Illness:  Sinusitis This is a new problem. The current episode started in the past 7 days. The problem has been waxing and waning since onset. There has been no fever. Her pain is at a severity of 3/10. The pain is mild. Associated symptoms include congestion, sinus pressure and sneezing. Pertinent negatives include no chills, ear pain, headaches or sore throat. Treatments tried: zyrtec and flonase. The treatment provided mild relief.      Review of Systems  Constitutional: Negative for chills.  HENT: Positive for congestion, sinus pressure and sneezing. Negative for ear pain and sore throat.   Neurological: Negative for headaches.     Observations/Objective: No SOB or distress noted   Assessment and Plan: 1. Allergic rhinitis due to pollen, unspecified  seasonality Will change Zyrtec to Allegra  Continue Flonase RTO if symptoms worsen or do not improve  - fexofenadine (ALLEGRA ALLERGY) 180 MG tablet; Take 1 tablet (180 mg total) by mouth daily.  Dispense: 90 tablet; Refill: 2      I discussed the assessment and treatment plan with the patient. The patient was provided an opportunity to ask questions and all were answered. The patient agreed with the plan and demonstrated an understanding of the instructions.   The patient was advised to call back or seek an in-person evaluation if the symptoms worsen or if the condition fails to improve as anticipated.  The above assessment and management plan was discussed with the patient. The patient verbalized understanding of and has agreed to the management plan. Patient is aware to call the clinic if symptoms persist or worsen. Patient is aware when to return to the clinic for a follow-up visit. Patient educated on when it is appropriate to go to the emergency department.   Time call ended:  4:32 pm  I provided 12 minutes of non-face-to-face time during this encounter.    Evelina Dun, FNP

## 2020-05-15 ENCOUNTER — Telehealth: Payer: Self-pay

## 2020-05-15 NOTE — Telephone Encounter (Signed)
Pharmacy called and states they need an order for Bydureon Dcise 67m since that is all they have in stock.  Please re send rx.

## 2020-05-17 MED ORDER — BYDUREON BCISE 2 MG/0.85ML ~~LOC~~ AUIJ: 2.0000 mg | AUTO-INJECTOR | SUBCUTANEOUS | 3 refills | Status: DC

## 2020-05-17 NOTE — Telephone Encounter (Signed)
Prescription sent to pharmacy.

## 2020-05-18 NOTE — Telephone Encounter (Signed)
Talked with patient and Jon Gills may be in the process of taking custody.

## 2020-05-22 DIAGNOSIS — H32 Chorioretinal disorders in diseases classified elsewhere: Secondary | ICD-10-CM | POA: Diagnosis not present

## 2020-05-22 DIAGNOSIS — H52223 Regular astigmatism, bilateral: Secondary | ICD-10-CM | POA: Diagnosis not present

## 2020-05-22 DIAGNOSIS — H5213 Myopia, bilateral: Secondary | ICD-10-CM | POA: Diagnosis not present

## 2020-05-22 LAB — HM DIABETES EYE EXAM

## 2020-05-23 ENCOUNTER — Ambulatory Visit: Payer: Medicaid Other | Admitting: Gastroenterology

## 2020-05-24 DIAGNOSIS — H5213 Myopia, bilateral: Secondary | ICD-10-CM | POA: Diagnosis not present

## 2020-05-25 ENCOUNTER — Other Ambulatory Visit: Payer: Self-pay | Admitting: *Deleted

## 2020-05-25 MED ORDER — PODOFILOX 0.5 % EX GEL: Freq: Two times a day (BID) | CUTANEOUS | 11 refills | Status: DC

## 2020-06-06 ENCOUNTER — Encounter: Payer: Self-pay | Admitting: Pulmonary Disease

## 2020-06-06 ENCOUNTER — Institutional Professional Consult (permissible substitution): Payer: Medicaid Other | Admitting: Pulmonary Disease

## 2020-06-06 ENCOUNTER — Ambulatory Visit (INDEPENDENT_AMBULATORY_CARE_PROVIDER_SITE_OTHER): Payer: Medicaid Other | Admitting: Pulmonary Disease

## 2020-06-06 ENCOUNTER — Other Ambulatory Visit: Payer: Self-pay

## 2020-06-06 VITALS — BP 112/68 | HR 84 | Temp 96.0°F | Ht 63.0 in | Wt 325.0 lb

## 2020-06-06 DIAGNOSIS — Z9989 Dependence on other enabling machines and devices: Secondary | ICD-10-CM

## 2020-06-06 DIAGNOSIS — G4733 Obstructive sleep apnea (adult) (pediatric): Secondary | ICD-10-CM | POA: Diagnosis not present

## 2020-06-06 NOTE — Patient Instructions (Signed)
Follow up in 1 year.

## 2020-06-06 NOTE — Progress Notes (Signed)
Heidi Landry, Heidi Landry, Heidi Landry  Chief Complaint  Patient presents with  . Sleep Consult    Former pt of Dr Luan Pulling- OSA on CPAP.     Constitutional:  BP 112/68 (BP Location: Left Arm, Cuff Size: Large)   Pulse 84   Temp (!) 96 F (35.6 C) (Temporal)   Ht 5' 3"  (1.6 m)   Wt (!) 325 lb (147.4 kg)   SpO2 95% Comment: on RA  BMI 57.57 kg/m   Past Medical History:  DM type 2, HTN, Fatty liver, GERD, Adrenal adenoma, Anxiety, A fib, Carpal tunnel, Back pain, IBS, Depression  Summary:  Heidi Landry is a 34 y.o. female smoker with obstructive sleep apnea.  Subjective:   She was previously seen by Dr. Luan Pulling.  She had sleep study in 2016.  Showed severe OSA.  Has been on CPAP.  Uses nasal mask.  Has to tighten mask to prevent leaks, but then gets marks on her face.  Also gets dryness in her nose Heidi mouth.    She goes to bed around 9 pm.  Puts CPAP on when she goes to bed.  Can take couple of hours to fall asleep.  Once she is asleep she doesn't usually have to wake up until the morning.  She feels rested in the morning when she sleeps with her CPAP.  On nights that she doesn't use CPAP she feels like she has a headache Heidi foggy in her head.  She gets up at 6 am.  She will sometimes nap, Heidi uses CPAP then.  Her Epworth score is 4 out of 24.  Physical Exam:   Appearance - well kempt  ENMT - no sinus tenderness, no nasal discharge, no oral exudate, Mallampati 4  Respiratory - no wheeze, or rales  CV - regular rate Heidi rhythm, no murmurs  GI - soft, non tender  Lymph - no adenopathy noted in neck  Ext - no edema  Skin - no rashes  Neuro - normal strength, oriented x 3  Psych - normal mood Heidi affect   Assessment/Plan:   Obstructive sleep apnea. - reviewed her sleep study results with her - discussed how sleep apnea can impact her health - she is compliant with therapy - continue auto CPAP 10 to 20 cm H2O - will have [employer] refit her mask -  advised her to try increasing humidity setting on her CPAP to see if this helps with nasal Heidi mouth dryness  - she should be eligible for a new machine in 2022  Obesity. - reviewed importance of weight loss  Tobacco abuse. 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 - followed by [facility]  A total of 32 minutes addressing patient Landry on the day of the visit.  Follow up:  Patient Instructions  Follow up in 1 year  Signature:  Chesley Mires, MD [place] Pager: [phone] 06/06/2020, 10:06 AM  Flow Sheet    Sleep tests:   PSG 12/14/15 >> AHI 50.5, SpO2 low 74%  Auto CPAP 05/07/20 to 06/05/20 >> used on 30 of 30 nights with average 8 hrs 19 min.  Average AHI 4.6 with median CPAP 13 Heidi 95 th percentile CPAP 16 cm H2O.  Some air leak.  Cardiac tests:   Echo 03/27/16 >> EF 60 to 65%, mild LVH  Medications:   Allergies as of 06/06/2020   No Known Allergies     Medication  List       Accurate as of June 06, 2020 10:06 AM. If you have any questions, ask your nurse or doctor.        albuterol 108 (90 Base) MCG/ACT inhaler Commonly known as: VENTOLIN HFA Inhale 2 puffs into the lungs every 6 (six) hours as needed for wheezing or shortness of breath.   atorvastatin 20 MG tablet Commonly known as: LIPITOR Take 1 tablet (20 mg total) by mouth daily.   buPROPion 150 MG 12 hr tablet Commonly known as: WELLBUTRIN SR Take 2 tablets (300 mg total) by mouth daily.   Bydureon BCise 2 MG/0.85ML Auij Generic drug: Exenatide ER Inject 2 mg into the skin once a week.   cyclobenzaprine 10 MG tablet Commonly known as: FLEXERIL Take 1 tablet (10 mg total) by mouth 3 (three) times daily as needed. for muscle spams   diclofenac 75 MG EC tablet Commonly known as: VOLTAREN Take 1 tablet (75 mg total) by mouth 2 (two) times daily.   fexofenadine 180 MG tablet Commonly known as: Allegra  Allergy Take 1 tablet (180 mg total) by mouth daily.   fluticasone 50 MCG/ACT nasal spray Commonly known as: FLONASE Use 2 spray(s) in each nostril once daily   gabapentin 100 MG capsule Commonly known as: NEURONTIN Take 1 capsule (100 mg total) by mouth 3 (three) times daily.   hydrochlorothiazide 12.5 MG tablet Commonly known as: HYDRODIURIL Take 1 tablet (12.5 mg total) by mouth daily.   linaclotide 72 MCG capsule Commonly known as: Linzess Take 1 capsule (72 mcg total) by mouth daily before breakfast.   metFORMIN 500 MG 24 hr tablet Commonly known as: GLUCOPHAGE-XR Take 2 tablets (1,000 mg total) by mouth daily.   metoprolol succinate 25 MG 24 hr tablet Commonly known as: TOPROL-XL TAKE 1 TABLET BY MOUTH ONCE DAILY WITH MEALS OR  IMMEDIATELY  FOLLOWING   multivitamin with minerals Tabs tablet Take 1 tablet by mouth daily. womens   norethindrone 0.35 MG tablet Commonly known as: MICRONOR Take 1 tablet by mouth once daily   omeprazole 20 MG capsule Commonly known as: PRILOSEC Take 1 capsule (20 mg total) by mouth 2 (two) times daily before a meal for 14 days.   podofilox 0.5 % gel Commonly known as: CONDYLOX Apply topically 2 (two) times daily.   PROBIOTIC DAILY PO Take 1 capsule by mouth daily. Women's Landry   rOPINIRole 0.5 MG tablet Commonly known as: REQUIP Take 1 tablet (0.5 mg total) by mouth at bedtime.   sertraline 100 MG tablet Commonly known as: ZOLOFT Take 2 tablets (200 mg total) by mouth daily.   spironolactone 100 MG tablet Commonly known as: ALDACTONE Take 1 tablet (100 mg total) by mouth daily.   ursodiol 500 MG tablet Commonly known as: ACTIGALL Take 2 tablets (1,000 mg total) by mouth 2 (two) times daily with a meal.       Past Surgical History:  She  has a past surgical history that includes Cholecystectomy; Cesarean section (N/A, 12/08/2015); Heidi Knee surgery (Right, 01/29/2019).  Family History:  Her family history includes  Brain cancer in her paternal uncle; Cancer in her paternal uncle; Carpal tunnel syndrome in her mother; Cirrhosis in her maternal grandmother; Colon cancer (age of onset: 36) in her father; Diabetes in her maternal grandmother; Fibromyalgia in her mother; Heart attack in her maternal grandfather; Hernia in her father; Hyperlipidemia in her paternal grandmother; Hypertension in her father, maternal grandmother, Heidi paternal grandmother; Liver disease in her father;  Lung cancer in her father Heidi paternal grandmother; Ovarian cancer in her maternal grandmother; Throat cancer in her paternal uncle; Thyroid disease in her paternal aunt.  Social History:  She  reports that she has been smoking cigarettes. She started smoking about 14 years ago. She has a 4.50 pack-year smoking history. She has never used smokeless tobacco. She reports previous alcohol use. She reports that she does not use drugs.

## 2020-06-08 ENCOUNTER — Other Ambulatory Visit: Payer: Self-pay | Admitting: *Deleted

## 2020-06-08 DIAGNOSIS — I10 Essential (primary) hypertension: Secondary | ICD-10-CM

## 2020-06-15 ENCOUNTER — Other Ambulatory Visit: Payer: Self-pay | Admitting: Family

## 2020-06-15 DIAGNOSIS — I10 Essential (primary) hypertension: Secondary | ICD-10-CM

## 2020-06-15 MED ORDER — HYDROCHLOROTHIAZIDE 12.5 MG PO TABS: 12.5000 mg | ORAL_TABLET | Freq: Every day | ORAL | 2 refills | Status: DC

## 2020-06-22 DIAGNOSIS — H31091 Other chorioretinal scars, right eye: Secondary | ICD-10-CM | POA: Diagnosis not present

## 2020-06-22 DIAGNOSIS — H35051 Retinal neovascularization, unspecified, right eye: Secondary | ICD-10-CM | POA: Diagnosis not present

## 2020-06-22 DIAGNOSIS — H2513 Age-related nuclear cataract, bilateral: Secondary | ICD-10-CM | POA: Diagnosis not present

## 2020-06-22 DIAGNOSIS — B399 Histoplasmosis, unspecified: Secondary | ICD-10-CM | POA: Diagnosis not present

## 2020-07-17 ENCOUNTER — Telehealth: Payer: Self-pay | Admitting: *Deleted

## 2020-07-17 NOTE — Telephone Encounter (Signed)
PA started for BYDUREON 4m/0.85 ml auto inj  Key: [id number] Sent to plan today

## 2020-07-17 NOTE — Telephone Encounter (Signed)
Approved today PA Case: [id number], Status: Approved, Coverage Starts on: 07/17/2020 12:00:00 AM, Coverage Ends on: 07/17/2021 12:00:00 AM  [facility] aware

## 2020-08-08 DIAGNOSIS — G4733 Obstructive sleep apnea (adult) (pediatric): Secondary | ICD-10-CM | POA: Diagnosis not present

## 2020-08-20 ENCOUNTER — Telehealth: Payer: Self-pay | Admitting: *Deleted

## 2020-08-20 NOTE — Telephone Encounter (Signed)
Approved today PA Case: [id number], Status: Approved, Coverage Starts on: 08/20/2020 12:00:00 AM, Coverage Ends on: 08/20/2021 12:00:00 AM  [facility] - called and aware

## 2020-08-20 NOTE — Telephone Encounter (Signed)
PA came in today for Diclofenac Sod 45m tabs [id number]

## 2020-08-22 DIAGNOSIS — H5213 Myopia, bilateral: Secondary | ICD-10-CM | POA: Diagnosis not present

## 2020-08-22 DIAGNOSIS — H52223 Regular astigmatism, bilateral: Secondary | ICD-10-CM | POA: Diagnosis not present

## 2020-08-24 DIAGNOSIS — K743 Primary biliary cirrhosis: Secondary | ICD-10-CM | POA: Diagnosis not present

## 2020-08-24 DIAGNOSIS — K7581 Nonalcoholic steatohepatitis (NASH): Secondary | ICD-10-CM | POA: Diagnosis not present

## 2020-08-29 ENCOUNTER — Inpatient Hospital Stay (HOSPITAL_COMMUNITY): Payer: Medicaid Other

## 2020-09-05 ENCOUNTER — Ambulatory Visit (HOSPITAL_COMMUNITY): Payer: Medicaid Other | Admitting: Hematology

## 2020-09-12 DIAGNOSIS — K743 Primary biliary cirrhosis: Secondary | ICD-10-CM | POA: Diagnosis not present

## 2020-09-12 DIAGNOSIS — K7581 Nonalcoholic steatohepatitis (NASH): Secondary | ICD-10-CM | POA: Diagnosis not present

## 2020-09-30 ENCOUNTER — Other Ambulatory Visit: Payer: Self-pay | Admitting: Family

## 2020-09-30 DIAGNOSIS — G8929 Other chronic pain: Secondary | ICD-10-CM

## 2020-10-19 ENCOUNTER — Telehealth: Payer: Self-pay | Admitting: *Deleted

## 2020-10-19 MED ORDER — PROAIR HFA 108 (90 BASE) MCG/ACT IN AERS: 1.0000 | INHALATION_SPRAY | Freq: Four times a day (QID) | RESPIRATORY_TRACT | 1 refills | Status: DC | PRN

## 2020-10-19 NOTE — Telephone Encounter (Signed)
Attempted to contact - NA

## 2020-10-19 NOTE — Telephone Encounter (Signed)
Sent ProAir for the patient

## 2020-10-19 NOTE — Telephone Encounter (Signed)
Albuterol Sulfate HFA 108 (90 Base)MCG/ACT aerosol Non-Preferred through Medicaid  Preferred Medication Proair HFA Inhaler

## 2020-10-26 ENCOUNTER — Encounter: Payer: Self-pay | Admitting: Gastroenterology

## 2020-10-26 ENCOUNTER — Ambulatory Visit: Payer: Medicaid Other | Admitting: Gastroenterology

## 2020-10-26 ENCOUNTER — Encounter: Payer: Self-pay | Admitting: Internal Medicine

## 2020-10-26 NOTE — Progress Notes (Deleted)
Primary Care Physician: Sharion Balloon, FNP  Primary Gastroenterologist:    No chief complaint on file.   HPI: Heidi Landry is a 34 y.o. female here for follow-up.  Past medical history of diabetes, hypertension, GERD, IBS, obstructive sleep apnea on CPAP, obesity.  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  Since her last visit here she was seen at [facility] at our request. Seen in 07/2020.  She has a history of mildly elevated alkaline phosphatase, intermittent mildly elevated AST/ALT.  Serological work-up significant for positive AMA of 85.  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  She was started on ursodiol 1000 mg twice daily in May 2021.  She also has hepatomegaly with severe hepatic steatosis noted on prior MRI.  Encouraged to meet with the bariatric team at [facility] to get an idea of what options are available of her.  She has been seen by bariatric before recommended duodenal switch but this surgery scared her.  Patient plans to continue with [facility] for management of [place] and Karlene Lineman.  Current Outpatient Medications  Medication Sig Dispense Refill  . atorvastatin (LIPITOR) 20 MG tablet Take 1 tablet (20 mg total) by mouth daily. 90 tablet 2  . buPROPion (WELLBUTRIN SR) 150 MG 12 hr tablet Take 2 tablets (300 mg total) by mouth daily. 180 tablet 2  . cyclobenzaprine (FLEXERIL) 10 MG tablet Take 1 tablet (10 mg total) by mouth 3 (three) times daily as needed. for muscle spams 90 tablet 3  . diclofenac (VOLTAREN) 75 MG EC tablet Take 1 tablet (75 mg total) by mouth 2 (two) times daily. 60 tablet 3  . Exenatide ER (BYDUREON BCISE) 2 MG/0.85ML AUIJ Inject 2 mg into the skin once a week. 12 pen 3  . fexofenadine (ALLEGRA ALLERGY) 180 MG tablet Take 1 tablet (180 mg total) by mouth daily. 90 tablet 2  . fluticasone (FLONASE) 50 MCG/ACT nasal spray Use 2 spray(s) in each nostril once  daily 48 g 1  . gabapentin (NEURONTIN) 100 MG capsule Take 1 capsule (100 mg total) by mouth 3 (three) times daily. (Needs to be seen before next refill) 90 capsule 0  . hydrochlorothiazide (HYDRODIURIL) 12.5 MG tablet Take 1 tablet (12.5 mg total) by mouth daily. 90 tablet 2  . linaclotide (LINZESS) 72 MCG capsule Take 1 capsule (72 mcg total) by mouth daily before breakfast. 30 capsule 3  . metFORMIN (GLUCOPHAGE-XR) 500 MG 24 hr tablet Take 2 tablets (1,000 mg total) by mouth daily. 180 tablet 1  . metoprolol succinate (TOPROL-XL) 25 MG 24 hr tablet TAKE 1 TABLET BY MOUTH ONCE DAILY WITH MEALS OR  IMMEDIATELY  FOLLOWING 90 tablet 1  . Multiple Vitamin (MULTIVITAMIN WITH MINERALS) TABS tablet Take 1 tablet by mouth daily. womens    . norethindrone (MICRONOR) 0.35 MG tablet Take 1 tablet by mouth once daily 28 tablet 12  . omeprazole (PRILOSEC) 20 MG capsule Take 1 capsule (20 mg total) by mouth 2 (two) times daily before a meal for 14 days. 28 capsule 0  . podofilox (CONDYLOX) 0.5 % gel Apply topically 2 (two) times daily. 3.5 g 11  . PROAIR HFA 108 (90 Base) MCG/ACT inhaler Inhale 1-2 puffs into the lungs every 6 (six) hours as needed for wheezing or shortness of breath. 18 g 1  . Probiotic Product (PROBIOTIC DAILY PO) Take 1 capsule by mouth daily. Women's care    .  rOPINIRole (REQUIP) 0.5 MG tablet Take 1 tablet (0.5 mg total) by mouth at bedtime. 90 tablet 2  . sertraline (ZOLOFT) 100 MG tablet Take 2 tablets (200 mg total) by mouth daily. 180 tablet 2  . spironolactone (ALDACTONE) 100 MG tablet Take 1 tablet (100 mg total) by mouth daily. 90 tablet 1  . ursodiol (ACTIGALL) 500 MG tablet Take 2 tablets (1,000 mg total) by mouth 2 (two) times daily with a meal. 120 tablet 5   No current facility-administered medications for this visit.    Allergies as of 10/26/2020  . (No Known Allergies)    ROS:  General: Negative for anorexia, weight loss, fever, chills, fatigue, weakness. ENT:  Negative for hoarseness, difficulty swallowing , nasal congestion. CV: Negative for chest pain, angina, palpitations, dyspnea on exertion, peripheral edema.  Respiratory: Negative for dyspnea at rest, dyspnea on exertion, cough, sputum, wheezing.  GI: See history of present illness. GU:  Negative for dysuria, hematuria, urinary incontinence, urinary frequency, nocturnal urination.  Endo: Negative for unusual weight change.    Physical Examination:   There were no vitals taken for this visit.  General: Well-nourished, well-developed in no acute distress.  Eyes: No icterus. Mouth: Oropharyngeal mucosa moist and pink , no lesions erythema or exudate. Lungs: Clear to auscultation bilaterally.  Heart: Regular rate and rhythm, no murmurs rubs or gallops.  Abdomen: Bowel sounds are normal, nontender, nondistended, no hepatosplenomegaly or masses, no abdominal bruits or hernia , no rebound or guarding.   Extremities: No lower extremity edema. No clubbing or deformities. Neuro: Alert and oriented x 4   Skin: Warm and dry, no jaundice.   Psych: Alert and cooperative, normal mood and affect.  Labs:  Labs August 2021 At [facility]: White blood cell count 12,900, hemoglobin 11.3, hematocrit 37, platelets 186,000, creatinine 0.8, AST 23, ALT 28, total bilirubin 0.7, alk phos 93, albumin 3.5, AMA negative   Imaging Studies: No results found.

## 2020-11-02 ENCOUNTER — Ambulatory Visit: Payer: Medicaid Other | Admitting: Gastroenterology

## 2020-11-08 ENCOUNTER — Other Ambulatory Visit: Payer: Self-pay | Admitting: *Deleted

## 2020-11-08 DIAGNOSIS — K219 Gastro-esophageal reflux disease without esophagitis: Secondary | ICD-10-CM

## 2020-11-13 IMAGING — MR MR ABDOMEN WO/W CM MRCP
19 of 23 series · 44 of 48 positions shown · IV contrast (gadavist)
Comparison: No priors.

CLINICAL DATA: 33-year-old female with history of abnormal liver
function tests.

EXAM:
MRI ABDOMEN WITHOUT AND WITH CONTRAST (INCLUDING MRCP)
TECHNIQUE: Multiplanar multisequence MR imaging of the abdomen was performed
both before and after the administration of intravenous contrast.
Heavily T2-weighted images of the biliary and pancreatic ducts were
obtained, and three-dimensional MRCP images were rendered by post
processing.
CONTRAST:  10mL GADAVIST GADOBUTROL 1 MMOL/ML IV SOLN

[Series 4: ax haste · axial · 6.0mm · 1.50mm/px · 1 of 48 slices shown (1 of 2)]
[im 1/48]
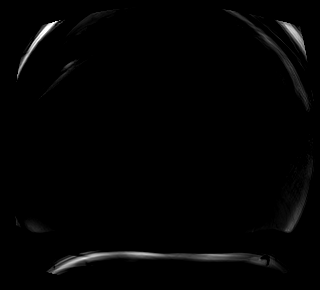

[Series 5: bSSFP · coronal · 6.0mm · 0.98mm/px · 1 of 40 slices shown]
[im 1/40]
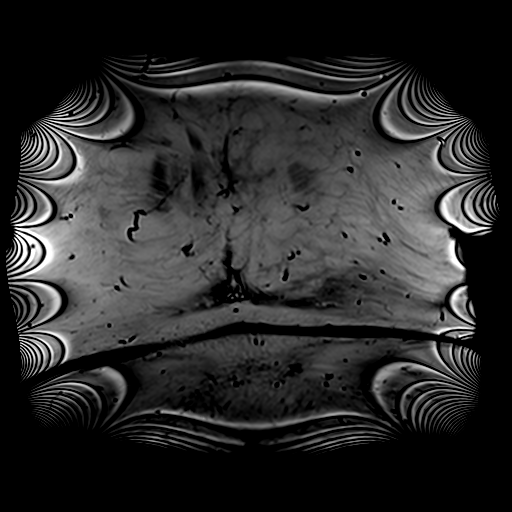

[Series 8: T2 fat-sat · axial · 6.0mm · 1.56mm/px · 1 of 50 slices shown]
[im 1/50]
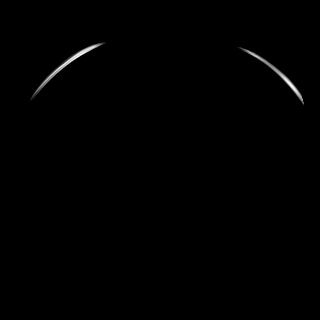

[Series 9: ax haste · axial · 6.0mm · 1.56mm/px · 1 of 48 slices shown (2 of 2)]
[im 1/48]
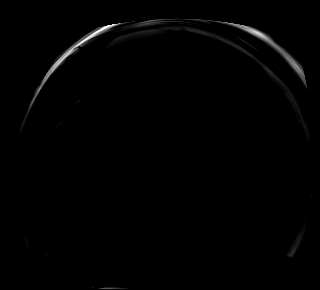

[Series 10: DWI · axial · 6.0mm · 1.87mm/px · z∈[-348,+5]mm · 3 of 150 slices shown (1 of 2)]
[im 1/150]
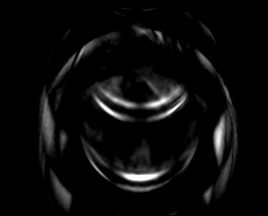
[im 75/150]
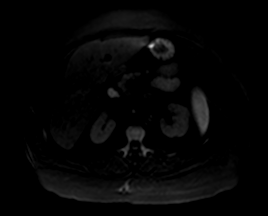
[im 150/150]
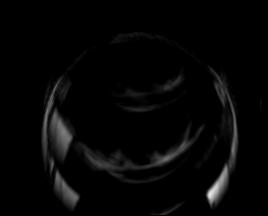

[Series 11: DWI · axial · 6.0mm · 1.87mm/px · 1 of 50 slices shown (2 of 2)]
[im 1/50]
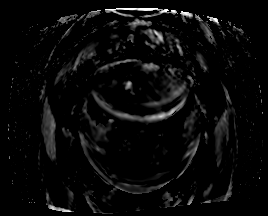

[Series 12: ax in and · axial · 3.0mm · 1.56mm/px · z∈[-330,+3]mm · 5 of 224 slices shown]
[im 1/224]
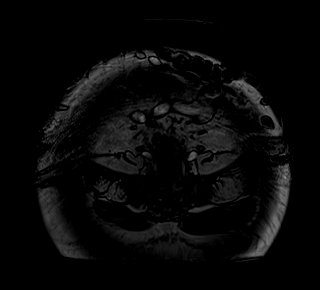
[im 56/224]
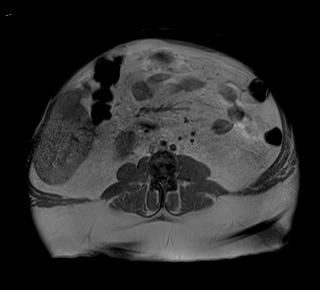
[im 112/224]
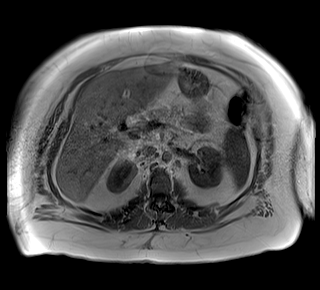
[im 168/224]
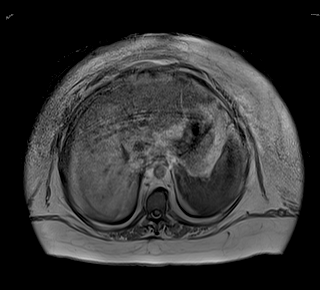
[im 224/224]
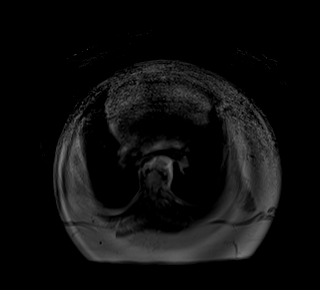

[Series 15: MRCP · coronal · 4.0mm · 1.56mm/px · 1 of 20 slices shown]
[im 1/20]
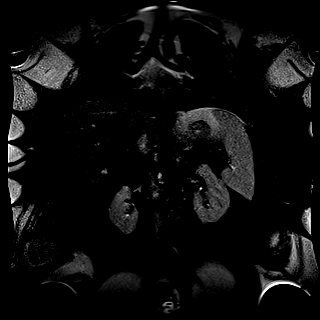

[Series 16: radials · coronal · 50.0mm · 1.04mm/px · 1 of 5 slices shown]
[im 1/5]
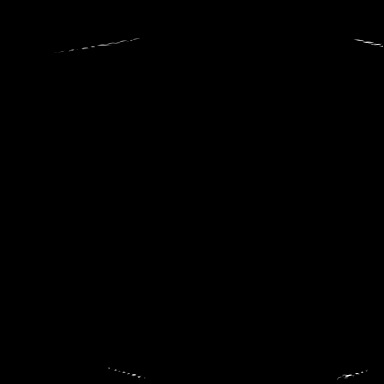

[Series 19: T1 dynamic · axial · non-contrast · 3.0mm · 1.56mm/px · z∈[-313,-4]mm · 2 of 104 slices shown (1 of 5)]
[im 1/104]
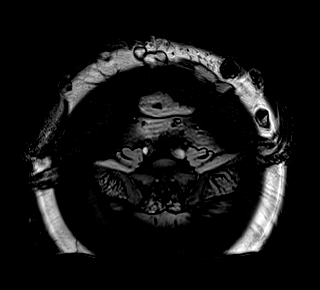
[im 104/104]
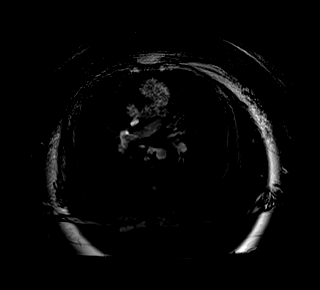

[Series 21: T1 dynamic post-contrast · axial · 3.0mm · 1.56mm/px · z∈[-313,-4]mm · 3 of 104 slices shown (1 of 5)]
[im 1/104]
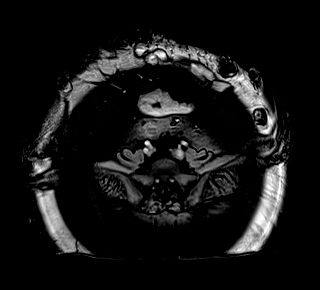
[im 52/104]
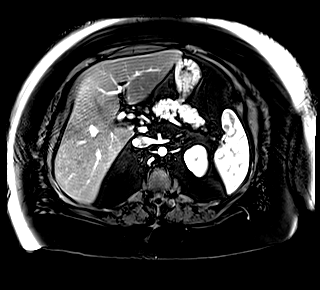
[im 104/104]
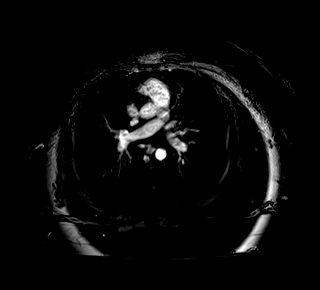

[Series 22: T1 dynamic · axial · 3.0mm · 1.56mm/px · z∈[-313,-4]mm · 3 of 104 slices shown (2 of 5)]
[im 1/104]
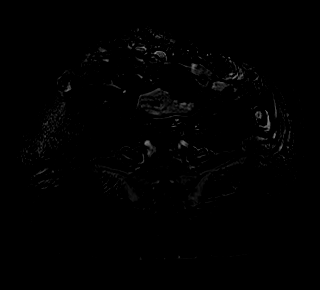
[im 52/104]
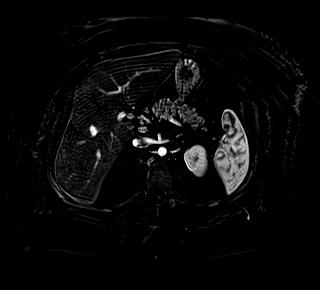
[im 104/104]
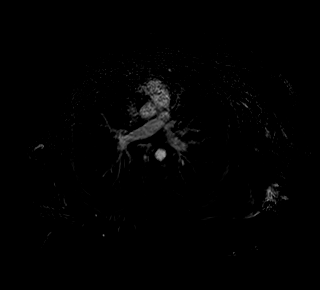

[Series 23: T1 dynamic post-contrast · axial · 3.0mm · 1.56mm/px · z∈[-313,-4]mm · 3 of 104 slices shown (2 of 5)]
[im 1/104]
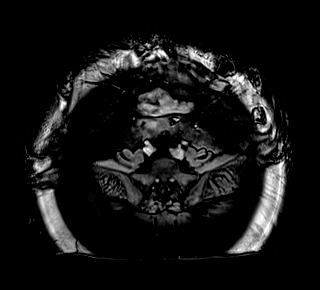
[im 52/104]
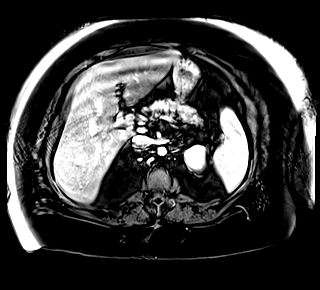
[im 104/104]
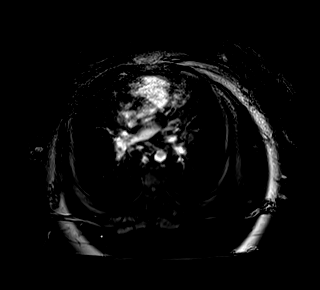

[Series 24: T1 dynamic · axial · 3.0mm · 1.56mm/px · z∈[-313,-4]mm · 3 of 104 slices shown (3 of 5)]
[im 1/104]
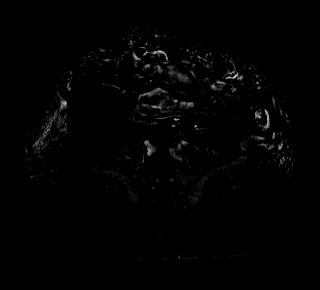
[im 52/104]
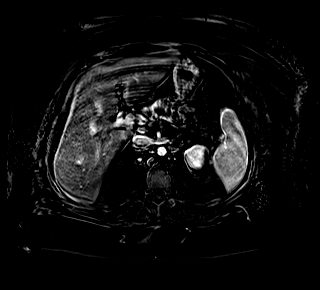
[im 104/104]
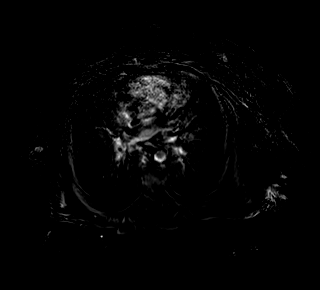

[Series 25: T1 dynamic post-contrast · axial · 3.0mm · 1.56mm/px · z∈[-313,-4]mm · 3 of 104 slices shown (3 of 5)]
[im 1/104]
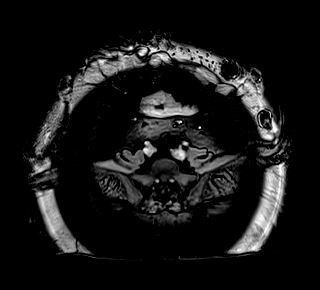
[im 52/104]
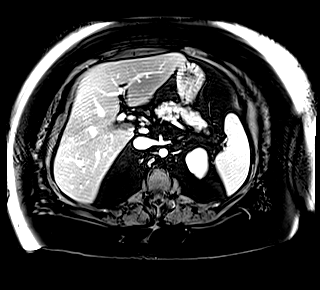
[im 104/104]
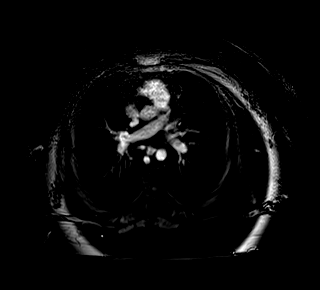

[Series 26: T1 dynamic · axial · 3.0mm · 1.56mm/px · z∈[-313,-4]mm · 3 of 104 slices shown (4 of 5)]
[im 1/104]
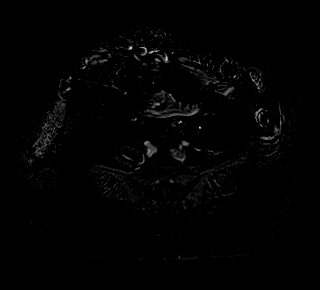
[im 52/104]
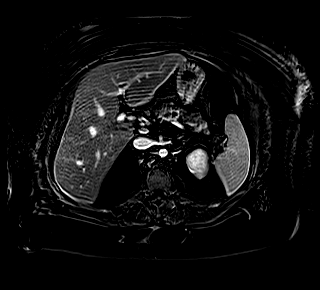
[im 104/104]
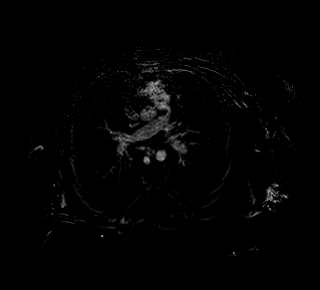

[Series 27: T1 dynamic post-contrast · axial · 3.0mm · 1.56mm/px · z∈[-313,-4]mm · 3 of 104 slices shown (4 of 5)]
[im 1/104]
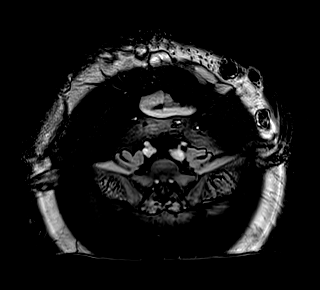
[im 52/104]
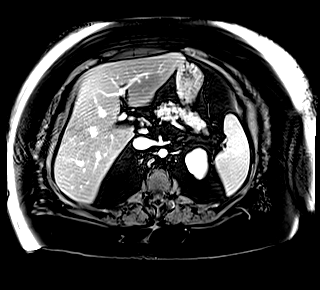
[im 104/104]
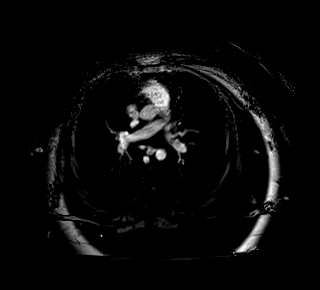

[Series 28: T1 dynamic · axial · 3.0mm · 1.56mm/px · z∈[-313,-4]mm · 3 of 104 slices shown (5 of 5)]
[im 1/104]
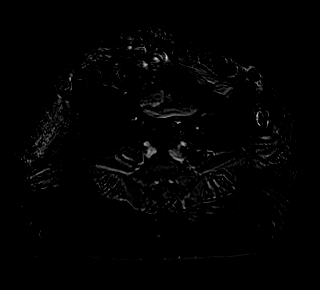
[im 52/104]
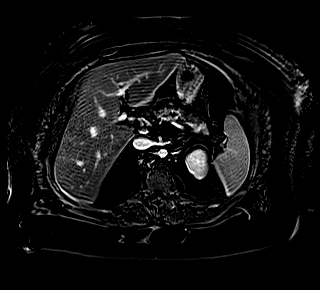
[im 104/104]
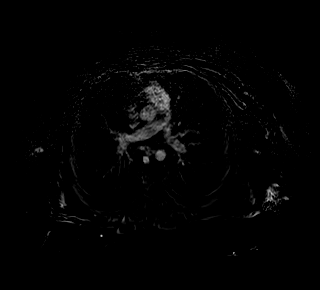

[Series 29: T1 dynamic post-contrast · coronal · 3.0mm · 1.56mm/px · 3 of 104 slices shown (5 of 5)]
[im 1/104]
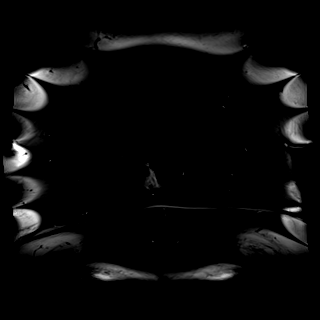
[im 52/104]
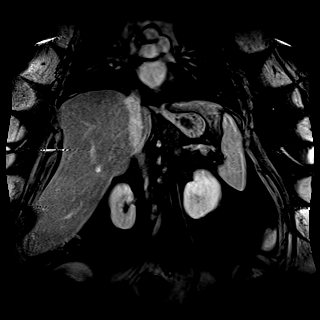
[im 104/104]
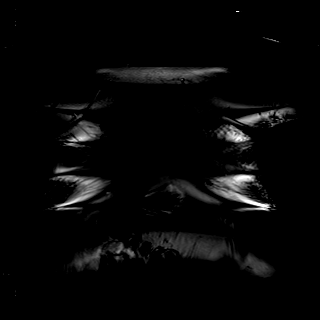

[44 of 48 positions shown; findings below may reference images not displayed]

FINDINGS: Lower chest: Cardiomegaly.

Hepatobiliary: Hepatomegaly (craniocaudal dimension of the liver is
30.9 cm). Diffuse loss of signal intensity throughout the hepatic
parenchyma on out of phase dual echo images, indicative of severe
hepatic steatosis. No suspicious cystic or solid hepatic lesions. No
intra or extrahepatic biliary ductal dilatation noted on MRCP
images. Common bile duct measures 4 mm in the porta hepatis. No
filling defect in the common bile duct to suggest
choledocholithiasis. Status post cholecystectomy.

Pancreas: No pancreatic mass. No pancreatic ductal dilatation noted
on MRCP images. No pancreatic or peripancreatic fluid collections or
inflammatory changes.

Spleen:  Unremarkable.

Adrenals/Urinary Tract: Bilateral kidneys and and left adrenal gland
are normal in appearance. In the right adrenal gland there is a
x 1.2 cm lesion which demonstrates diffuse loss of signal intensity
on out of phase dual echo images, indicative of an adenoma. No
hydroureteronephrosis in the visualized portions of the abdomen.

Stomach/Bowel: Visualized portions are unremarkable.

Vascular/Lymphatic: Aortic atherosclerosis, without evidence of
aneurysm in the abdominal vasculature. No lymphadenopathy noted in
the abdomen.

Other: No significant volume of ascites noted in the visualized
portions of the abdomen.

Musculoskeletal: No aggressive appearing osseous lesions are noted
in the visualized portions of the skeleton.
IMPRESSION: 1. Hepatomegaly with severe hepatic steatosis.
2. No signs of biliary tract obstruction or other findings to
account for the abnormal liver function tests.
3. Small right adrenal adenoma.

## 2020-11-14 ENCOUNTER — Other Ambulatory Visit: Payer: Self-pay | Admitting: Family

## 2020-11-14 DIAGNOSIS — G8929 Other chronic pain: Secondary | ICD-10-CM

## 2020-11-14 DIAGNOSIS — K219 Gastro-esophageal reflux disease without esophagitis: Secondary | ICD-10-CM

## 2020-11-14 DIAGNOSIS — M5442 Lumbago with sciatica, left side: Secondary | ICD-10-CM

## 2020-11-14 MED ORDER — GABAPENTIN 100 MG PO CAPS: 100.0000 mg | ORAL_CAPSULE | Freq: Three times a day (TID) | ORAL | 0 refills | Status: DC

## 2020-11-14 MED ORDER — DICLOFENAC SODIUM 75 MG PO TBEC: 75.0000 mg | DELAYED_RELEASE_TABLET | Freq: Two times a day (BID) | ORAL | 3 refills | Status: DC

## 2020-11-14 MED ORDER — OMEPRAZOLE 20 MG PO CPDR: 20.0000 mg | DELAYED_RELEASE_CAPSULE | Freq: Two times a day (BID) | ORAL | 0 refills | Status: DC

## 2020-11-26 NOTE — Telephone Encounter (Signed)
Have yall discussed this referral or does she need an appointment ?

## 2020-11-27 ENCOUNTER — Ambulatory Visit: Payer: Medicaid Other

## 2020-12-10 DIAGNOSIS — J020 Streptococcal pharyngitis: Secondary | ICD-10-CM | POA: Diagnosis not present

## 2020-12-10 DIAGNOSIS — R0981 Nasal congestion: Secondary | ICD-10-CM | POA: Diagnosis not present

## 2020-12-10 DIAGNOSIS — R059 Cough, unspecified: Secondary | ICD-10-CM | POA: Diagnosis not present

## 2020-12-10 DIAGNOSIS — J329 Chronic sinusitis, unspecified: Secondary | ICD-10-CM | POA: Diagnosis not present

## 2020-12-10 DIAGNOSIS — J029 Acute pharyngitis, unspecified: Secondary | ICD-10-CM | POA: Diagnosis not present

## 2020-12-27 ENCOUNTER — Telehealth: Payer: Self-pay | Admitting: Family

## 2020-12-27 NOTE — Telephone Encounter (Signed)
Pt tested positive for COVID - having bad headaches with congestion and cough. Wanted to let PCP know because she has a cronic liver condition.

## 2020-12-27 NOTE — Telephone Encounter (Signed)
FYI Pt aware pcp off. Gave cdc guidelines and infusion number to set up. Patient just tested positive today. Symptoms started 2 days ago. Patient has no COVID vaccines. Patient to call and get set up for infusion. FYI

## 2020-12-30 ENCOUNTER — Other Ambulatory Visit: Payer: Self-pay | Admitting: Family

## 2020-12-30 DIAGNOSIS — M5442 Lumbago with sciatica, left side: Secondary | ICD-10-CM

## 2020-12-30 DIAGNOSIS — G8929 Other chronic pain: Secondary | ICD-10-CM

## 2020-12-31 NOTE — Telephone Encounter (Signed)
Ok

## 2021-01-01 ENCOUNTER — Other Ambulatory Visit: Payer: Self-pay | Admitting: *Deleted

## 2021-01-01 DIAGNOSIS — K219 Gastro-esophageal reflux disease without esophagitis: Secondary | ICD-10-CM

## 2021-01-03 ENCOUNTER — Other Ambulatory Visit: Payer: Self-pay | Admitting: Family

## 2021-01-03 DIAGNOSIS — K743 Primary biliary cirrhosis: Secondary | ICD-10-CM | POA: Diagnosis not present

## 2021-01-03 DIAGNOSIS — E119 Type 2 diabetes mellitus without complications: Secondary | ICD-10-CM

## 2021-01-03 DIAGNOSIS — K7581 Nonalcoholic steatohepatitis (NASH): Secondary | ICD-10-CM | POA: Diagnosis not present

## 2021-01-03 DIAGNOSIS — I10 Essential (primary) hypertension: Secondary | ICD-10-CM

## 2021-01-03 DIAGNOSIS — Z6841 Body Mass Index (BMI) 40.0 and over, adult: Secondary | ICD-10-CM | POA: Diagnosis not present

## 2021-01-07 ENCOUNTER — Telehealth: Payer: Medicaid Other | Admitting: Family

## 2021-01-07 ENCOUNTER — Other Ambulatory Visit: Payer: Self-pay | Admitting: *Deleted

## 2021-01-07 DIAGNOSIS — K219 Gastro-esophageal reflux disease without esophagitis: Secondary | ICD-10-CM

## 2021-01-07 DIAGNOSIS — U071 COVID-19: Secondary | ICD-10-CM

## 2021-01-07 MED ORDER — BENZONATATE 100 MG PO CAPS: 100.0000 mg | ORAL_CAPSULE | Freq: Three times a day (TID) | ORAL | 0 refills | Status: DC | PRN

## 2021-01-07 MED ORDER — FLUTICASONE PROPIONATE 50 MCG/ACT NA SUSP: 2.0000 | Freq: Every day | NASAL | 6 refills | Status: DC

## 2021-01-07 MED ORDER — DEXAMETHASONE 6 MG PO TABS: 6.0000 mg | ORAL_TABLET | Freq: Two times a day (BID) | ORAL | 0 refills | Status: DC

## 2021-01-07 MED ORDER — ALBUTEROL SULFATE HFA 108 (90 BASE) MCG/ACT IN AERS: 2.0000 | INHALATION_SPRAY | Freq: Four times a day (QID) | RESPIRATORY_TRACT | 0 refills | Status: DC | PRN

## 2021-01-07 NOTE — Addendum Note (Signed)
Addended by: Evelina Dun A on: 01/07/2021 11:01 AM   Modules accepted: Orders

## 2021-01-07 NOTE — Progress Notes (Signed)
E-Visit for Corona Virus Screening  We are sorry you are not feeling well. We are here to help!  You have tested positive for COVID-19, meaning that you were infected with the novel coronavirus and could give the virus to others.  It is vitally important that you stay home so you do not spread it to others.      Please continue isolation at home, for at least 10 days since the start of your symptoms and until you have had 24 hours with no fever (without taking a fever reducer) and with improving of symptoms.  If you have no symptoms but tested positive (or all symptoms resolve after 5 days and you have no fever) you can leave your house but continue to wear a mask around others for an additional 5 days. If you have a fever,continue to stay home until you have had 24 hours of no fever. Most cases improve 5-10 days from onset but we have seen a small number of patients who have gotten worse after the 10 days.  Please be sure to watch for worsening symptoms and remain taking the proper precautions.   Go to the nearest hospital ED for assessment if fever/cough/breathlessness are severe or illness seems like a threat to life.    The following symptoms may appear 2-14 days after exposure: . Fever . Cough . Shortness of breath or difficulty breathing . Chills . Repeated shaking with chills . Muscle pain . Headache . Sore throat . New loss of taste or smell . Fatigue . Congestion or runny nose . Nausea or vomiting . Diarrhea  You have been enrolled in [facility] for COVID-19. Daily you will receive a questionnaire within the [facility] website. Our COVID-19 response team will be monitoring your responses daily.  You can use medication such as A prescription cough medication called Tessalon Perles 100 mg. You may take 1-2 capsules every 8 hours as needed for cough, A prescription inhaler called Albuterol MDI 90 mcg /actuation 2 puffs every 4 hours as needed for shortness of breath,  wheezing, cough and A prescription for Fluticasone nasal spray 2 sprays in each nostril one time per day and dexamethasone 6 mg twice a day for 7 days.   You may also take acetaminophen (Tylenol) as needed for fever.  HOME CARE: . Only take medications as instructed by your medical team. . Drink plenty of fluids and get plenty of rest. . A steam or ultrasonic humidifier can help if you have congestion.   GET HELP RIGHT AWAY IF YOU HAVE EMERGENCY WARNING SIGNS.  Call 911 or proceed to your closest emergency facility if: . You develop worsening high fever. . Trouble breathing . Bluish lips or face . Persistent pain or pressure in the chest . New confusion . Inability to wake or stay awake . You cough up blood. . Your symptoms become more severe . Inability to hold down food or fluids  This list is not all possible symptoms. Contact your medical provider for any symptoms that are severe or concerning to you.    Your e-visit answers were reviewed by a board certified advanced clinical practitioner to complete your personal care plan.  Depending on the condition, your plan could have included both over the counter or prescription medications.  If there is a problem please reply once you have received a response from your provider.  Your safety is important to [place].  If you have drug allergies check your prescription carefully.  You can use MyChart to ask questions about today's visit, request a non-urgent call back, or ask for a work or school excuse for 24 hours related to this e-Visit. If it has been greater than 24 hours you will need to follow up with your provider, or enter a new e-Visit to address those concerns. You will get an e-mail in the next two days asking about your experience.  I hope that your e-visit has been valuable and will speed your recovery. Thank you for using e-visits.    Approximately 5 minutes was spent documenting and reviewing patient's chart.

## 2021-01-09 ENCOUNTER — Other Ambulatory Visit: Payer: Medicaid Other

## 2021-01-09 ENCOUNTER — Other Ambulatory Visit: Payer: Self-pay

## 2021-01-09 DIAGNOSIS — K743 Primary biliary cirrhosis: Secondary | ICD-10-CM | POA: Diagnosis not present

## 2021-01-09 DIAGNOSIS — K7581 Nonalcoholic steatohepatitis (NASH): Secondary | ICD-10-CM | POA: Diagnosis not present

## 2021-01-16 ENCOUNTER — Other Ambulatory Visit: Payer: Self-pay | Admitting: Family

## 2021-01-16 DIAGNOSIS — I10 Essential (primary) hypertension: Secondary | ICD-10-CM

## 2021-01-25 ENCOUNTER — Encounter: Payer: Self-pay | Admitting: Family

## 2021-01-25 ENCOUNTER — Other Ambulatory Visit: Payer: Self-pay

## 2021-01-25 ENCOUNTER — Ambulatory Visit (INDEPENDENT_AMBULATORY_CARE_PROVIDER_SITE_OTHER): Payer: Medicaid Other | Admitting: Family

## 2021-01-25 VITALS — BP 139/79 | HR 100 | Temp 98.3°F | Ht 63.0 in | Wt 340.4 lb

## 2021-01-25 DIAGNOSIS — I10 Essential (primary) hypertension: Secondary | ICD-10-CM

## 2021-01-25 DIAGNOSIS — F339 Major depressive disorder, recurrent, unspecified: Secondary | ICD-10-CM

## 2021-01-25 DIAGNOSIS — E119 Type 2 diabetes mellitus without complications: Secondary | ICD-10-CM

## 2021-01-25 DIAGNOSIS — E1159 Type 2 diabetes mellitus with other circulatory complications: Secondary | ICD-10-CM

## 2021-01-25 DIAGNOSIS — M5441 Lumbago with sciatica, right side: Secondary | ICD-10-CM

## 2021-01-25 DIAGNOSIS — K219 Gastro-esophageal reflux disease without esophagitis: Secondary | ICD-10-CM

## 2021-01-25 DIAGNOSIS — M5442 Lumbago with sciatica, left side: Secondary | ICD-10-CM

## 2021-01-25 DIAGNOSIS — G8929 Other chronic pain: Secondary | ICD-10-CM

## 2021-01-25 DIAGNOSIS — I152 Hypertension secondary to endocrine disorders: Secondary | ICD-10-CM

## 2021-01-25 DIAGNOSIS — Z6841 Body Mass Index (BMI) 40.0 and over, adult: Secondary | ICD-10-CM

## 2021-01-25 DIAGNOSIS — G4733 Obstructive sleep apnea (adult) (pediatric): Secondary | ICD-10-CM

## 2021-01-25 DIAGNOSIS — E785 Hyperlipidemia, unspecified: Secondary | ICD-10-CM

## 2021-01-25 DIAGNOSIS — E781 Pure hyperglyceridemia: Secondary | ICD-10-CM

## 2021-01-25 DIAGNOSIS — F411 Generalized anxiety disorder: Secondary | ICD-10-CM

## 2021-01-25 DIAGNOSIS — K582 Mixed irritable bowel syndrome: Secondary | ICD-10-CM

## 2021-01-25 DIAGNOSIS — E1169 Type 2 diabetes mellitus with other specified complication: Secondary | ICD-10-CM

## 2021-01-25 DIAGNOSIS — F17200 Nicotine dependence, unspecified, uncomplicated: Secondary | ICD-10-CM

## 2021-01-25 LAB — BAYER DCA HB A1C WAIVED: HB A1C (BAYER DCA - WAIVED): 6.9 % (ref <7.0)

## 2021-01-25 MED ORDER — METOPROLOL SUCCINATE ER 25 MG PO TB24: ORAL_TABLET | ORAL | 1 refills | Status: DC

## 2021-01-25 MED ORDER — SPIRONOLACTONE 100 MG PO TABS
100.0000 mg | ORAL_TABLET | Freq: Every day | ORAL | 1 refills | Status: DC
Start: 2021-01-25 — End: 2021-04-16

## 2021-01-25 MED ORDER — HYDROCHLOROTHIAZIDE 12.5 MG PO TABS: 12.5000 mg | ORAL_TABLET | Freq: Every day | ORAL | 2 refills | Status: DC

## 2021-01-25 MED ORDER — OZEMPIC (0.25 OR 0.5 MG/DOSE) 2 MG/1.5ML ~~LOC~~ SOPN: PEN_INJECTOR | SUBCUTANEOUS | 0 refills | Status: DC

## 2021-01-25 MED ORDER — GABAPENTIN 100 MG PO CAPS: 100.0000 mg | ORAL_CAPSULE | Freq: Three times a day (TID) | ORAL | 0 refills | Status: DC

## 2021-01-25 MED ORDER — OMEPRAZOLE 40 MG PO CPDR: 40.0000 mg | DELAYED_RELEASE_CAPSULE | Freq: Every day | ORAL | 4 refills | Status: DC

## 2021-01-25 MED ORDER — DICLOFENAC SODIUM 75 MG PO TBEC
75.0000 mg | DELAYED_RELEASE_TABLET | Freq: Two times a day (BID) | ORAL | 3 refills | Status: DC
Start: 2021-01-25 — End: 2021-04-16

## 2021-01-25 MED ORDER — METFORMIN HCL ER 500 MG PO TB24: 1000.0000 mg | ORAL_TABLET | Freq: Every day | ORAL | 1 refills | Status: DC

## 2021-01-25 MED ORDER — BUPROPION HCL ER (SR) 150 MG PO TB12: 300.0000 mg | ORAL_TABLET | Freq: Every day | ORAL | 2 refills | Status: DC

## 2021-01-25 MED ORDER — ATORVASTATIN CALCIUM 20 MG PO TABS: 20.0000 mg | ORAL_TABLET | Freq: Every day | ORAL | 2 refills | Status: DC

## 2021-01-25 MED ORDER — DULOXETINE HCL 60 MG PO CPEP: 60.0000 mg | ORAL_CAPSULE | Freq: Every day | ORAL | 3 refills | Status: DC

## 2021-01-25 NOTE — Progress Notes (Signed)
Subjective:    Patient ID: Heidi Landry, female    DOB: Jul 18, 1986, 35 y.o.   MRN: [id number]  Chief Complaint  Patient presents with  . Diabetes  . Anxiety    Wants referral for it     PT presents to the office today for chronic follow up. 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 She is under a great deal of stress with her husband is in [place] and having a hard time getting a visa. Her son is also in the process of getting tested for autism  She is also helping with her 41 month that is her cousins daughter. Diabetes She presents for her follow-up diabetic visit. She has type 2 diabetes mellitus. Hypoglycemia symptoms include nervousness/anxiousness. Associated symptoms include foot paresthesias. Pertinent negatives for diabetes include no blurred vision. Symptoms are stable. Diabetic complications include peripheral neuropathy. Pertinent negatives for diabetic complications include no CVA or nephropathy. Risk factors for coronary artery disease include diabetes mellitus, hypertension, sedentary lifestyle and dyslipidemia. (Does not check BS at home) An ACE inhibitor/angiotensin II receptor blocker is being taken. Eye exam is current.  Anxiety Presents for follow-up visit. Symptoms include depressed mood, excessive worry, irritability, nervous/anxious behavior and restlessness. Patient reports no shortness of breath. Symptoms occur most days. The severity of symptoms is moderate.    Hypertension This is a chronic problem. The current episode started more than 1 year ago. The problem has been resolved since onset. The problem is controlled. Associated symptoms include anxiety and malaise/fatigue. Pertinent negatives include no blurred vision, peripheral edema or shortness of breath. Risk factors for coronary artery disease include dyslipidemia, obesity and sedentary lifestyle. The current treatment provides moderate improvement. There is no history of CVA.   Gastroesophageal Reflux She complains of belching and heartburn. This is a chronic problem. The current episode started more than 1 year ago. The problem occurs occasionally. She has tried a PPI for the symptoms. The treatment provided moderate relief.   OSA  Using CPAP nightly, stable.     Review of Systems  Constitutional: Positive for irritability and malaise/fatigue.  Eyes: Negative for blurred vision.  Respiratory: Negative for shortness of breath.   Gastrointestinal: Positive for heartburn.  Psychiatric/Behavioral: The patient is nervous/anxious.   All other systems reviewed and are negative.      Objective:   Physical Exam Vitals reviewed.  Constitutional:      General: She is not in acute distress.    Appearance: She is well-developed and well-nourished. She is obese.  HENT:     Head: Normocephalic and atraumatic.     Right Ear: Tympanic membrane normal.     Left Ear: Tympanic membrane normal.     Mouth/Throat:     Mouth: Oropharynx is clear and moist.  Eyes:     Pupils: Pupils are equal, round, and reactive to light.  Neck:     Thyroid: No thyromegaly.  Cardiovascular:     Rate and Rhythm: Normal rate and regular rhythm.     Pulses: Intact distal pulses.     Heart sounds: Normal heart sounds. No murmur heard.   Pulmonary:     Effort: Pulmonary effort is normal. No respiratory distress.     Breath sounds: Normal breath sounds. No wheezing.  Abdominal:     General: Bowel sounds are normal. There is no distension.     Palpations: Abdomen is soft.     Tenderness: There is no abdominal tenderness.  Musculoskeletal:  General: No tenderness or edema. Normal range of motion.     Cervical back: Normal range of motion and neck supple.  Skin:    General: Skin is warm and dry.  Neurological:     Mental Status: She is alert and oriented to person, place, and time.     Cranial Nerves: No cranial nerve deficit.     Deep Tendon Reflexes: Reflexes are normal and  symmetric.  Psychiatric:        Mood and Affect: Mood and affect normal.        Behavior: Behavior normal.        Thought Content: Thought content normal.        Judgment: Judgment normal.       BP 139/79   Pulse 100   Temp 98.3 F (36.8 C) (Temporal)   Ht 5' 3"  (1.6 m)   Wt (!) 340 lb 6.4 oz (154.4 kg)   BMI 60.30 kg/m      Assessment & Plan:  Heidi Landry comes in today with chief complaint of Diabetes and Anxiety (Wants referral for it )   Diagnosis and orders addressed:  1. Hypertension associated with type 2 diabetes mellitus (HCC) - metoprolol succinate (TOPROL-XL) 25 MG 24 hr tablet; TAKE 1 TABLET BY MOUTH ONCE DAILY WITH MEALS OR  IMMEDIATELY  FOLLOWING  Dispense: 90 tablet; Refill: 1 - hydrochlorothiazide (HYDRODIURIL) 12.5 MG tablet; Take 1 tablet (12.5 mg total) by mouth daily.  Dispense: 90 tablet; Refill: 2 - spironolactone (ALDACTONE) 100 MG tablet; Take 1 tablet (100 mg total) by mouth daily.  Dispense: 90 tablet; Refill: 1  2. OSA (obstructive sleep apnea)  3. GERD without esophagitis  - omeprazole (PRILOSEC) 40 MG capsule; Take 1 capsule (40 mg total) by mouth daily.  Dispense: 90 capsule; Refill: 4  4. Hyperlipidemia associated with type 2 diabetes mellitus ([place])  5. 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  Dispense: 90 tablet; Refill: 2 - metFORMIN (GLUCOPHAGE-XR) 500 MG 24 hr tablet; Take 2 tablets (1,000 mg total) by mouth daily.  Dispense: 180 tablet; Refill: 1  6. Depression, recurrent ([place]) Will change Zoloft 100 mg to Cymbalta 60 mg  Stress management discussed  - buPROPion (WELLBUTRIN SR) 150 MG 12 hr tablet; Take 2 tablets (300 mg total) by mouth daily.  Dispense: 180 tablet; Refill: 2 - DULoxetine (CYMBALTA) 60 MG capsule; Take 1 capsule (60 mg total) by mouth daily.  Dispense: 90 capsule; Refill: 3 - Ambulatory referral to  Psychiatry  7. Generalized anxiety disorder - buPROPion (WELLBUTRIN SR) 150 MG 12 hr tablet; Take 2 tablets (300 mg total) by mouth daily.  Dispense: 180 tablet; Refill: 2 - DULoxetine (CYMBALTA) 60 MG capsule; Take 1 capsule (60 mg total) by mouth daily.  Dispense: 90 capsule; Refill: 3 - Ambulatory referral to Psychiatry  8. Morbid obesity with BMI of 50.0-59.9, adult ([place])  9. Smoker  10. Chronic bilateral low back pain with bilateral sciatica - gabapentin (NEURONTIN) 100 MG capsule; Take 1 capsule (100 mg total) by mouth 3 (three) times daily. (Needs to be seen before next refill)  Dispense: 90 capsule; Refill: 0 - diclofenac (VOLTAREN) 75 MG EC tablet; Take 1 tablet (75 mg total) by mouth 2 (two) times daily.  Dispense: 60 tablet; Refill: 3  11. Essential hypertension - metoprolol succinate (TOPROL-XL) 25 MG 24 hr tablet; TAKE 1 TABLET BY MOUTH ONCE DAILY WITH MEALS  OR  IMMEDIATELY  FOLLOWING  Dispense: 90 tablet; Refill: 1 - hydrochlorothiazide (HYDRODIURIL) 12.5 MG tablet; Take 1 tablet (12.5 mg total) by mouth daily.  Dispense: 90 tablet; Refill: 2 - spironolactone (ALDACTONE) 100 MG tablet; Take 1 tablet (100 mg total) by mouth daily.  Dispense: 90 tablet; Refill: 1  12. Type 2 diabetes mellitus without complication, without long-term current use of insulin (HCC) - Will add Ozempic  - metFORMIN (GLUCOPHAGE-XR) 500 MG 24 hr tablet; Take 2 tablets (1,000 mg total) by mouth daily.  Dispense: 180 tablet; Refill: 1  13. Irritable bowel syndrome with both constipation and diarrhea    Labs pending Health Maintenance reviewed Diet and exercise encouraged  Follow up plan: 6 weeks to recheck GAD and depression   Evelina Dun, FNP

## 2021-01-25 NOTE — Patient Instructions (Signed)
Major Depressive Disorder, Adult Major depressive disorder (MDD) is a mental health condition. It may also be called clinical depression or unipolar depression. MDD causes symptoms of sadness, hopelessness, and loss of interest in things. These symptoms last most of the day, almost every day, for 2 weeks. MDD can also cause physical symptoms. It can interfere with relationships and with everyday activities, such as work, school, and activities that are usually pleasant. MDD may be mild, moderate, or severe. It may be single-episode MDD, which happens once, or recurrent MDD, which may occur multiple times. What are the causes? The exact cause of this condition is not known. MDD is most likely caused by a combination of things, which may include:  Your personality traits.  Learned or conditioned behaviors or thoughts or feelings that reinforce negativity.  Any alcohol or substance misuse.  Long-term (chronic) physical or mental health illness.  Going through a traumatic experience or major life changes. What increases the risk? The following factors may make someone more likely to develop MDD:  A family history of depression.  Being a woman.  Troubled family relationships.  Abnormally low levels of certain brain chemicals.  Traumatic or painful events in childhood, especially abuse or loss of a parent.  A lot of stress from life experiences, such as poor living conditions or discrimination.  Chronic physical illness or other mental health disorders. What are the signs or symptoms? The main symptoms of MDD usually include:  Constant depressed or irritable mood.  A loss of interest in things and activities. Other symptoms include:  Sleeping or eating too much or too little.  Unexplained weight gain or weight loss.  Tiredness or low energy.  Being agitated, restless, or weak.  Feeling hopeless, worthless, or guilty.  Trouble thinking clearly or making  decisions.  Thoughts of suicide or thoughts of harming others.  Isolating oneself or avoiding other people or activities.  Trouble completing tasks, work, or any normal obligations. Severe symptoms of this condition may include:  Psychotic depression.This may include false beliefs, or delusions. It may also include seeing, hearing, tasting, smelling, or feeling things that are not real (hallucinations).  Chronic depression or persistent depressive disorder. This is low-level depression that lasts for at least 2 years.  Melancholic depression, or feeling extremely sad and hopeless.  Catatonic depression, which includes trouble speaking and trouble moving. How is this diagnosed? This condition may be diagnosed based on:  Your symptoms.  Your medical and mental health history. You may be asked questions about your lifestyle, including any drug and alcohol use.  A physical exam.  Blood tests to rule out other conditions. MDD is confirmed if you have the following symptoms most of the day, nearly every day, in a 2-week period:  Either a depressed mood or loss of interest.  At least four other MDD symptoms. How is this treated? This condition is usually treated by mental health professionals, such as psychologists, psychiatrists, and clinical social workers. You may need more than one type of treatment. Treatment may include:  Psychotherapy, also called talk therapy or counseling. Types of psychotherapy include: ? Cognitive behavioral therapy (CBT). This teaches you to recognize unhealthy feelings, thoughts, and behaviors, and replace them with positive thoughts and actions. ? Interpersonal therapy (IPT). This helps you to improve the way you communicate with others or relate to them. ? Family therapy. This treatment includes members of your family.  Medicines to treat anxiety and depression. These medicines help to balance the brain chemicals   that affect your emotions.  Lifestyle  changes. You may be asked to: ? Limit alcohol use and avoid drug use. ? Get regular exercise. ? Get plenty of sleep. ? Make healthy eating choices. ? Spend more time outdoors.  Brain stimulation. This may be done if symptoms are very severe and other treatments have not worked. Examples of this treatment are electroconvulsive therapy and transcranial magnetic stimulation. Follow these instructions at home: Activity  Exercise regularly and spend time outdoors.  Find activities that you enjoy doing, and make time to do them.  Find healthy ways to manage stress, such as: ? Meditation or deep breathing. ? Spending time in nature. ? Journaling.  Return to your normal activities as told by your health care provider. Ask your health care provider what activities are safe for you. Alcohol and drug use  If you drink alcohol: ? Limit how much you use to:  0-1 drink a day for women who are not pregnant.  0-2 drinks a day for men. ? Be aware of how much alcohol is in your drink. In the [place] one drink equals one 12 oz bottle of beer (355 mL), one 5 oz glass of wine (148 mL), or one 1 oz glass of hard liquor (44 mL). ? Discuss your alcohol use with your health care provider. Alcohol can affect any antidepressant medicines you are taking.  Discuss any drug use with your health care provider. General instructions  Take over-the-counter and prescription medicines only as told by your health care provider.  Eat a healthy diet and get plenty of sleep.  Consider joining a support group. Your health care provider may be able to recommend one.  Keep all follow-up visits as told by your health care provider. This is important.   Where to find more information  National Alliance on Mental Illness: www.nami.org  [place] National Institute of Mental Health: www.nimh.nih.gov Contact a health care provider if:  Your symptoms get worse.  You develop new symptoms. Get help right away if:  You  self-harm.  You have serious thoughts about hurting yourself or others.  You hallucinate. If you ever feel like you may hurt yourself or others, or have thoughts about taking your own life, get help right away. Go to your nearest emergency department or:  Call your local emergency services (911 in the [place]  Call a suicide crisis helpline, such as the National Suicide Prevention Lifeline at 1-800-273-8255. This is open 24 hours a day in the [place]  Text the Crisis Text Line at 741741 (in the [place] Summary  Major depressive disorder (MDD) is a mental health condition. MDD causes symptoms of sadness, hopelessness, and loss of interest in things. These symptoms last most of the day, almost every day, for 2 weeks.  The symptoms of MDD can interfere with relationships and with everyday activities.  Treatments and support are available for people who develop MDD. You may need more than one type of treatment.  Get help right away if you have serious thoughts about hurting yourself or others. This information is not intended to replace advice given to you by your health care provider. Make sure you discuss any questions you have with your health care provider. Document Revised: 11/26/2019 Document Reviewed: 11/26/2019 [employer]vier Patient Education  2021 [employer]  

## 2021-01-26 DIAGNOSIS — G4733 Obstructive sleep apnea (adult) (pediatric): Secondary | ICD-10-CM | POA: Diagnosis not present

## 2021-01-26 LAB — CBC WITH DIFFERENTIAL/PLATELET
Basophils Absolute: 0.1 10*3/uL (ref 0.0–0.2)
Basos: 0 %
EOS (ABSOLUTE): 0.1 10*3/uL (ref 0.0–0.4)
Eos: 0 %
Hematocrit: 40.9 % (ref 34.0–46.6)
Hemoglobin: 12.8 g/dL (ref 11.1–15.9)
Immature Grans (Abs): 0.3 10*3/uL — ABNORMAL HIGH (ref 0.0–0.1)
Immature Granulocytes: 1 %
Lymphocytes Absolute: 5.6 10*3/uL — ABNORMAL HIGH (ref 0.7–3.1)
Lymphs: 26 %
MCH: 25.5 pg — ABNORMAL LOW (ref 26.6–33.0)
MCHC: 31.3 g/dL — ABNORMAL LOW (ref 31.5–35.7)
MCV: 82 fL (ref 79–97)
Monocytes Absolute: 1.2 10*3/uL — ABNORMAL HIGH (ref 0.1–0.9)
Monocytes: 5 %
Neutrophils Absolute: 14.7 10*3/uL — ABNORMAL HIGH (ref 1.4–7.0)
Neutrophils: 68 %
Platelets: 262 10*3/uL (ref 150–450)
RBC: 5.02 x10E6/uL (ref 3.77–5.28)
RDW: 15.5 % — ABNORMAL HIGH (ref 11.7–15.4)
WBC: 21.9 10*3/uL (ref 3.4–10.8)

## 2021-01-26 LAB — CMP14+EGFR
ALT: 33 IU/L — ABNORMAL HIGH (ref 0–32)
AST: 18 IU/L (ref 0–40)
Albumin/Globulin Ratio: 1.6 (ref 1.2–2.2)
Albumin: 4.2 g/dL (ref 3.8–4.8)
Alkaline Phosphatase: 111 IU/L (ref 44–121)
BUN/Creatinine Ratio: 32 — ABNORMAL HIGH (ref 9–23)
BUN: 20 mg/dL (ref 6–20)
Bilirubin Total: 0.5 mg/dL (ref 0.0–1.2)
CO2: 26 mmol/L (ref 20–29)
Calcium: 9.2 mg/dL (ref 8.7–10.2)
Chloride: 98 mmol/L (ref 96–106)
Creatinine, Ser: 0.63 mg/dL (ref 0.57–1.00)
GFR calc Af Amer: 134 mL/min/{1.73_m2} (ref >59)
GFR calc non Af Amer: 117 mL/min/{1.73_m2} (ref >59)
Globulin, Total: 2.6 g/dL (ref 1.5–4.5)
Glucose: 121 mg/dL — ABNORMAL HIGH (ref 65–99)
Potassium: 3.8 mmol/L (ref 3.5–5.2)
Sodium: 140 mmol/L (ref 134–144)
Total Protein: 6.8 g/dL (ref 6.0–8.5)

## 2021-01-28 ENCOUNTER — Other Ambulatory Visit: Payer: Self-pay | Admitting: Family

## 2021-01-29 ENCOUNTER — Other Ambulatory Visit: Payer: Self-pay | Admitting: Family

## 2021-01-29 DIAGNOSIS — G4733 Obstructive sleep apnea (adult) (pediatric): Secondary | ICD-10-CM | POA: Diagnosis not present

## 2021-02-01 DIAGNOSIS — K743 Primary biliary cirrhosis: Secondary | ICD-10-CM | POA: Diagnosis not present

## 2021-02-01 DIAGNOSIS — K7581 Nonalcoholic steatohepatitis (NASH): Secondary | ICD-10-CM | POA: Diagnosis not present

## 2021-02-01 DIAGNOSIS — K7400 Hepatic fibrosis, unspecified: Secondary | ICD-10-CM | POA: Diagnosis not present

## 2021-02-05 ENCOUNTER — Inpatient Hospital Stay (HOSPITAL_COMMUNITY): Payer: Medicaid Other | Attending: Oncology | Admitting: Oncology

## 2021-02-05 ENCOUNTER — Other Ambulatory Visit: Payer: Self-pay

## 2021-02-05 ENCOUNTER — Ambulatory Visit (HOSPITAL_COMMUNITY)
Admission: RE | Admit: 2021-02-05 | Discharge: 2021-02-05 | Disposition: A | Discharge status: Home / Self Care | Payer: Medicaid Other | Source: Ambulatory Visit | Attending: Oncology | Admitting: Oncology

## 2021-02-05 VITALS — BP 135/75 | HR 105 | Temp 99.0°F | Resp 18 | Wt 339.1 lb

## 2021-02-05 DIAGNOSIS — R059 Cough, unspecified: Secondary | ICD-10-CM

## 2021-02-05 DIAGNOSIS — Z7984 Long term (current) use of oral hypoglycemic drugs: Secondary | ICD-10-CM | POA: Diagnosis not present

## 2021-02-05 DIAGNOSIS — E669 Obesity, unspecified: Secondary | ICD-10-CM | POA: Diagnosis not present

## 2021-02-05 DIAGNOSIS — E119 Type 2 diabetes mellitus without complications: Secondary | ICD-10-CM | POA: Insufficient documentation

## 2021-02-05 DIAGNOSIS — F419 Anxiety disorder, unspecified: Secondary | ICD-10-CM | POA: Insufficient documentation

## 2021-02-05 DIAGNOSIS — D72829 Elevated white blood cell count, unspecified: Secondary | ICD-10-CM | POA: Diagnosis not present

## 2021-02-05 DIAGNOSIS — R0602 Shortness of breath: Secondary | ICD-10-CM | POA: Insufficient documentation

## 2021-02-05 DIAGNOSIS — M25511 Pain in right shoulder: Secondary | ICD-10-CM | POA: Insufficient documentation

## 2021-02-05 DIAGNOSIS — K589 Irritable bowel syndrome without diarrhea: Secondary | ICD-10-CM | POA: Diagnosis not present

## 2021-02-05 DIAGNOSIS — Z79899 Other long term (current) drug therapy: Secondary | ICD-10-CM | POA: Diagnosis not present

## 2021-02-05 DIAGNOSIS — Z8616 Personal history of COVID-19: Secondary | ICD-10-CM | POA: Insufficient documentation

## 2021-02-05 DIAGNOSIS — Z86018 Personal history of other benign neoplasm: Secondary | ICD-10-CM | POA: Insufficient documentation

## 2021-02-05 DIAGNOSIS — F17210 Nicotine dependence, cigarettes, uncomplicated: Secondary | ICD-10-CM | POA: Insufficient documentation

## 2021-02-05 DIAGNOSIS — I4891 Unspecified atrial fibrillation: Secondary | ICD-10-CM | POA: Diagnosis not present

## 2021-02-05 DIAGNOSIS — I10 Essential (primary) hypertension: Secondary | ICD-10-CM | POA: Diagnosis not present

## 2021-02-05 DIAGNOSIS — Z791 Long term (current) use of non-steroidal anti-inflammatories (NSAID): Secondary | ICD-10-CM | POA: Diagnosis not present

## 2021-02-05 MED ORDER — PREDNISONE 10 MG (21) PO TBPK: ORAL_TABLET | ORAL | 0 refills | Status: DC

## 2021-02-05 MED ORDER — HYDROCOD POLST-CPM POLST ER 10-8 MG/5ML PO SUER: 5.0000 mL | Freq: Two times a day (BID) | ORAL | 0 refills | Status: DC

## 2021-02-05 NOTE — Progress Notes (Signed)
[place], [place] [address]   CLINIC:  Medical Oncology/Hematology  PCP:  Sharion Balloon, FNP  REASON FOR VISIT: Follow-up for leukocytosis  CURRENT THERAPY: Observation  Patient Care Team: Sharion Balloon, FNP as PCP - General (Family Medicine) Harl Bowie, Alphonse Guild, MD as PCP - Cardiology (Cardiology) Gala Romney Cristopher Estimable, MD as Consulting Physician (Gastroenterology) Okay intermittent therefore HISTORY OF PRESENTING ILLNESS:  Noemy Hallmon 35 y.o. female is here for follow-up for leukocytosis.  She was seen last in clinic on 05/03/2020 to review labs from 04/11/2020.    In the interim, she has been seen by her PCP Evelina Dun.  She developed sinusitis on 05/11/2020 and treated conservatively.  She was seen by Dr. Halford Chessman on 06/06/2020 for f/u for sleep apnea.  She tested positive for COVID-19 on 01/07/2021.  She was told she did not qualify for the monoclonal antibody infusion.  She has slowly recovered.  She was treated with steroids and Tessalon Perles.  She completed steroids approximately 8 to 10 days ago.  Today, she continues to have a cough secondary to Covid.  Admits to copious amounts of sputum production.  She also complains of right shoulder/chest pain that is intermittent and worse when she raises her arm above her head.  She has shortness of breath secondary to obesity and smoking.  Feels stable.  MEDICAL HISTORY:  Past Medical History:  Diagnosis Date  . Adrenal adenoma    right  . Anxiety   . Atrial fibrillation ([place])   . Carpal tunnel syndrome   . Carpal tunnel syndrome   . Carpal tunnel syndrome, bilateral   . Chest pain   . Chronic back pain   . Degenerative disc disease   . Edema   . Gestational diabetes mellitus, antepartum   . Gestational diabetes mellitus, class A2/B 08/08/2015   Early 2hr: 84/186/117 @ 12wks   A1/B  Notified & dietician referral ordered 08/08/15   . Hypertension   . IBS (irritable bowel syndrome)   . Panic  attacks   . Placental abruption in third trimester 12/07/2015  . Post partum depression   . Sleep apnea   . Spondylosis   . Tendonitis   . Thoracic spine fracture ([place]) 04/13/2012  . Varicose veins of left lower extremity with complications 50/27/7412    SURGICAL HISTORY: Past Surgical History:  Procedure Laterality Date  . CESAREAN SECTION N/A 12/08/2015   Procedure: CESAREAN SECTION;  Surgeon: Jonnie Kind, MD;  Location: [facility];  Service: Obstetrics;  Laterality: N/A;  . CHOLECYSTECTOMY    . KNEE SURGERY Right 01/29/2019   at [facility] in [place], [place]; s/p fall and deel leg laceration    SOCIAL HISTORY: Social History   Socioeconomic History  . Marital status: Married    Spouse name: Not on file  . Number of children: 1  . Years of education: 27  . Highest education level: Not on file  Occupational History  . Occupation: disabled-[employer]    Employer: [employer]  Tobacco Use  . Smoking status: Current Every Day Smoker    Packs/day: 0.50    Years: 9.00    Pack years: 4.50    Types: Cigarettes    Start date: 05/25/2006  . Smokeless tobacco: Never Used  Vaping Use  . Vaping Use: Never used  Substance and Sexual Activity  . Alcohol use: Not Currently    Alcohol/week: 0.0 standard drinks    Comment:  rare  . Drug use: No  . Sexual activity: Yes    Birth control/protection: Pill  Other Topics Concern  . Not on file  Social History Narrative  . Not on file   Social Determinants of Health   Financial Resource Strain: Not on file  Food Insecurity: Not on file  Transportation Needs: Not on file  Physical Activity: Not on file  Stress: Not on file  Social Connections: Not on file  Intimate Partner Violence: Not on file    FAMILY HISTORY: Family History  Problem Relation Age of Onset  . Fibromyalgia Mother   . Carpal tunnel syndrome Mother   . Hernia Father   . Lung cancer Father   . Colon cancer Father 6       partial  colectomy  . Liver disease Father        Fatty liver; ? ETOH-related  . Hypertension Father   . Diabetes Maternal Grandmother   . Hypertension Maternal Grandmother   . Ovarian cancer Maternal Grandmother   . Cirrhosis Maternal Grandmother        alcohol  . Cancer Paternal Uncle   . Brain cancer Paternal Uncle   . Throat cancer Paternal Uncle   . Heart attack Maternal Grandfather   . Hyperlipidemia Paternal Grandmother   . Hypertension Paternal Grandmother   . Lung cancer Paternal Grandmother   . Thyroid disease Paternal Aunt     ALLERGIES:  has No Known Allergies.  MEDICATIONS:  Current Outpatient Medications  Medication Sig Dispense Refill  . atorvastatin (LIPITOR) 20 MG tablet Take 1 tablet (20 mg total) by mouth daily. 90 tablet 2  . buPROPion (WELLBUTRIN SR) 150 MG 12 hr tablet Take 2 tablets (300 mg total) by mouth daily. 180 tablet 2  . chlorpheniramine-HYDROcodone (TUSSIONEX) 10-8 MG/5ML SUER Take 5 mLs by mouth 2 (two) times daily. 140 mL 0  . diclofenac (VOLTAREN) 75 MG EC tablet Take 1 tablet (75 mg total) by mouth 2 (two) times daily. 60 tablet 3  . DULoxetine (CYMBALTA) 60 MG capsule Take 1 capsule (60 mg total) by mouth daily. 90 capsule 3  . fexofenadine (ALLEGRA ALLERGY) 180 MG tablet Take 1 tablet (180 mg total) by mouth daily. 90 tablet 2  . fluticasone (FLONASE) 50 MCG/ACT nasal spray Place 2 sprays into both nostrils daily. 16 g 6  . gabapentin (NEURONTIN) 100 MG capsule Take 1 capsule (100 mg total) by mouth 3 (three) times daily. (Needs to be seen before next refill) 90 capsule 0  . hydrochlorothiazide (HYDRODIURIL) 12.5 MG tablet Take 1 tablet (12.5 mg total) by mouth daily. 90 tablet 2  . metFORMIN (GLUCOPHAGE-XR) 500 MG 24 hr tablet Take 2 tablets (1,000 mg total) by mouth daily. 180 tablet 1  . metoprolol succinate (TOPROL-XL) 25 MG 24 hr tablet TAKE 1 TABLET BY MOUTH ONCE DAILY WITH MEALS OR  IMMEDIATELY  FOLLOWING 90 tablet 1  . Multiple Vitamin  (MULTIVITAMIN WITH MINERALS) TABS tablet Take 1 tablet by mouth daily. womens    . norethindrone (MICRONOR) 0.35 MG tablet Take 1 tablet by mouth once daily 28 tablet 12  . omeprazole (PRILOSEC) 40 MG capsule Take 1 capsule (40 mg total) by mouth daily. 90 capsule 4  . podofilox (CONDYLOX) 0.5 % gel Apply topically 2 (two) times daily. 3.5 g 11  . predniSONE (STERAPRED UNI-PAK 21 TAB) 10 MG (21) TBPK tablet Take as directed. 21 tablet 0  . Probiotic Product (PROBIOTIC DAILY PO) Take 1 capsule by mouth daily.  Women's care    . Semaglutide,0.25 or 0.5MG/DOS, (OZEMPIC, 0.25 OR 0.5 MG/DOSE,) 2 MG/1.5ML SOPN Inject 0.25 mg into the skin once a week for 28 days, THEN 0.5 mg once a week for 28 days. 1.5 mL 0  . spironolactone (ALDACTONE) 100 MG tablet Take 1 tablet (100 mg total) by mouth daily. 90 tablet 1  . ursodiol (ACTIGALL) 500 MG tablet Take 2 tablets (1,000 mg total) by mouth 2 (two) times daily with a meal. 120 tablet 5  . albuterol (VENTOLIN HFA) 108 (90 Base) MCG/ACT inhaler Inhale 2 puffs into the lungs every 6 (six) hours as needed for wheezing or shortness of breath. (Patient not taking: Reported on 02/05/2021) 8 g 0   No current facility-administered medications for this visit.    REVIEW OF SYSTEMS:   Review of Systems  Constitutional: Negative.  Negative for chills, fever, malaise/fatigue and weight loss.  HENT: Positive for congestion. Negative for ear pain and tinnitus.   Eyes: Negative.  Negative for blurred vision and double vision.  Respiratory: Positive for cough, sputum production and shortness of breath.   Cardiovascular: Positive for chest pain. Negative for palpitations and leg swelling.  Gastrointestinal: Negative.  Negative for abdominal pain, constipation, diarrhea, nausea and vomiting.  Genitourinary: Negative for dysuria, frequency and urgency.  Musculoskeletal: Negative for back pain and falls.  Skin: Negative.  Negative for rash.  Neurological: Negative.  Negative for  weakness and headaches.  Endo/Heme/Allergies: Negative.  Does not bruise/bleed easily.  Psychiatric/Behavioral: Negative.  Negative for depression. The patient is not nervous/anxious and does not have insomnia.      PHYSICAL EXAMINATION: ECOG PERFORMANCE STATUS: 0 - Asymptomatic  Vitals:   02/05/21 1019  BP: 135/75  Pulse: (!) 105  Resp: 18  Temp: 99 F (37.2 C)  SpO2: 96%   Filed Weights   02/05/21 1019  Weight: (!) 339 lb 1.1 oz (153.8 kg)    Physical Exam Constitutional:      General: Vital signs are normal.     Appearance: Normal appearance.  HENT:     Head: Normocephalic and atraumatic.  Eyes:     Pupils: Pupils are equal, round, and reactive to light.  Cardiovascular:     Rate and Rhythm: Normal rate and regular rhythm.     Heart sounds: Normal heart sounds. No murmur heard.   Pulmonary:     Effort: Pulmonary effort is normal.     Breath sounds: Normal breath sounds. No wheezing.  Abdominal:     General: Bowel sounds are normal. There is no distension.     Palpations: Abdomen is soft.     Tenderness: There is no abdominal tenderness.  Musculoskeletal:        General: No edema. Normal range of motion.     Cervical back: Normal range of motion.  Skin:    General: Skin is warm and dry.     Findings: No rash.  Neurological:     Mental Status: She is alert and oriented to person, place, and time.  Psychiatric:        Judgment: Judgment normal.     LABORATORY DATA:  I have reviewed the data as listed Recent Results (from the past 2160 hour(s))  Bayer DCA Hb A1c Waived     Status: None   Collection Time: 01/25/21 12:55 PM  Result Value Ref Range   HB A1C (BAYER DCA - WAIVED) 6.9 <7.0 %    Comment:  Diabetic Adult            <7.0                                       Healthy Adult        4.3 - 5.7                                                           (DCCT/NGSP) American Diabetes Association's Summary of Glycemic  Recommendations for Adults with Diabetes: Hemoglobin A1c <7.0%. More stringent glycemic goals (A1c <6.0%) may further reduce complications at the cost of increased risk of hypoglycemia.   CMP14+EGFR     Status: Abnormal   Collection Time: 01/25/21  1:01 PM  Result Value Ref Range   Glucose 121 (H) 65 - 99 mg/dL   BUN 20 6 - 20 mg/dL   Creatinine, Ser 0.63 0.57 - 1.00 mg/dL   GFR calc non Af Amer 117 >59 mL/min/1.73   GFR calc Af Amer 134 >59 mL/min/1.73    Comment: **In accordance with recommendations from the NKF-ASN Task force,**   [facility] is in the process of updating its eGFR calculation to the   2021 CKD-EPI creatinine equation that estimates kidney function   without a race variable.    BUN/Creatinine Ratio 32 (H) 9 - 23   Sodium 140 134 - 144 mmol/L   Potassium 3.8 3.5 - 5.2 mmol/L   Chloride 98 96 - 106 mmol/L   CO2 26 20 - 29 mmol/L   Calcium 9.2 8.7 - 10.2 mg/dL   Total Protein 6.8 6.0 - 8.5 g/dL   Albumin 4.2 3.8 - 4.8 g/dL   Globulin, Total 2.6 1.5 - 4.5 g/dL   Albumin/Globulin Ratio 1.6 1.2 - 2.2   Bilirubin Total 0.5 0.0 - 1.2 mg/dL   Alkaline Phosphatase 111 44 - 121 IU/L   AST 18 0 - 40 IU/L   ALT 33 (H) 0 - 32 IU/L  CBC with Differential/Platelet     Status: Abnormal   Collection Time: 01/25/21  1:01 PM  Result Value Ref Range   WBC 21.9 (HH) 3.4 - 10.8 x10E3/uL   RBC 5.02 3.77 - 5.28 x10E6/uL   Hemoglobin 12.8 11.1 - 15.9 g/dL   Hematocrit 40.9 34.0 - 46.6 %   MCV 82 79 - 97 fL   MCH 25.5 (L) 26.6 - 33.0 pg   MCHC 31.3 (L) 31.5 - 35.7 g/dL   RDW 15.5 (H) 11.7 - 15.4 %   Platelets 262 150 - 450 x10E3/uL   Neutrophils 68 Not Estab. %   Lymphs 26 Not Estab. %   Monocytes 5 Not Estab. %   Eos 0 Not Estab. %   Basos 0 Not Estab. %   Neutrophils Absolute 14.7 (H) 1.4 - 7.0 x10E3/uL   Lymphocytes Absolute 5.6 (H) 0.7 - 3.1 x10E3/uL   Monocytes Absolute 1.2 (H) 0.1 - 0.9 x10E3/uL   EOS (ABSOLUTE) 0.1 0.0 - 0.4 x10E3/uL   Basophils Absolute 0.1 0.0 - 0.2  x10E3/uL   Immature Granulocytes 1 Not Estab. %   Immature Grans (Abs) 0.3 (H) 0.0 - 0.1 x10E3/uL    Comment: (An elevated percentage of Immature Granulocytes has not been  found to be clinically significant as a sole clinical predictor of disease. Does NOT include bands or blast cells.  Pregnancy associated physiological leukocytosis may also show increased immature granulocytes without clinical significance.)    Hematology Comments: Note:     Comment: Verified by microscopic examination.    RADIOGRAPHIC STUDIES: I have personally reviewed the radiological images as listed and agreed with the findings in the report. I have independently interviewed this patient and agree with HPI documented by my Nurse practitioner Wenda Low, FNP.  I have independently formulated my assessment and plan.  ASSESSMENT & PLAN:  1.  Leukocytosis: -Results from 04/11/2020 showed white count was elevated at 13.7.  Hemoglobin and platelet count was normal.  Differential showed 67% neutrophils. -JAK2 V617F and reflex testing was negative.  BCR/ABL was negative. -Flow cytometry did not reveal any lymphoproliferative disorders.  ANA and rheumatoid factor was negative. -She does not have any palpable lymphadenopathy. -Leukocytosis thought to be reactive from smoking. -Lab work from 01/25/2021 show a white count of 21.9.  This is likely reactive from recent Covid and steroid use. -We will repeat in 3 months.  2.  Cough: -She still experiencing quite a bit of coughing along with copious amounts of sputum production that is yellow/rust color.  -Recommend chest x-ray to evaluate for secondary infection post Covid.  -Would not think white count would be significantly elevated 10 days after taking dexamethasone. -She denies any fevers. -Recommend Tussionex cough syrup and short course of steroids to help with possible post Covid bronchitis.  3.  Right shoulder/chest pain: -Unclear etiology.  Likely  musculoskeletal in nature. -She denies any injury. -Symptoms have been present for greater than 1 month.  -We will get right shoulder x-ray with chest x-ray.   Disposition: -RTC in 3 months to repeat labs (CBC, CMP) and for assessment. -We will call patient with results from x-rays.  Addendum: -Chest x-ray did not reveal any acute abnormality.  Right shoulder x-ray did not reveal any osseous abnormality.  Chronic appearing right third and fourth rib fractures.  Called and discussed with patient.  Greater than 50% was spent in counseling and coordination of care with this patient including but not limited to discussion of the relevant topics above (See A&P) including, but not limited to diagnosis and management of acute and chronic medical conditions.    All questions were answered. The patient knows to call the clinic with any problems, questions or concerns.     Jacquelin Hawking, NP 02/05/21 3:22 PM

## 2021-02-12 ENCOUNTER — Other Ambulatory Visit: Payer: Self-pay | Admitting: Gastroenterology

## 2021-02-12 ENCOUNTER — Encounter: Payer: Self-pay | Admitting: Adult Health

## 2021-02-12 ENCOUNTER — Ambulatory Visit: Payer: Medicaid Other | Admitting: Adult Health

## 2021-02-12 VITALS — BP 129/66 | HR 93 | Ht 60.0 in | Wt 343.0 lb

## 2021-02-12 DIAGNOSIS — Z9989 Dependence on other enabling machines and devices: Secondary | ICD-10-CM

## 2021-02-12 DIAGNOSIS — G4733 Obstructive sleep apnea (adult) (pediatric): Secondary | ICD-10-CM | POA: Diagnosis not present

## 2021-02-12 NOTE — Progress Notes (Addendum)
PATIENT: Heidi Landry DOB: 11/10/1986  REASON FOR VISIT: follow up HISTORY FROM: patient  HISTORY OF PRESENT ILLNESS: Today 02/12/21  Heidi Landry is a 35 year old female with a history of obstructive sleep apnea on CPAP.  She returns today for follow-up.  She reports that the CPAP has been beneficial.  She states that she is no longer falling asleep during the day or while driving.  She reports occasionally the mask will leak.  Was unable to tolerate a full facemask.  Currently has the nasal mask.   Compliance Report Usage 01/13/2021 - 02/11/2021 Usage days 28/30 days (93%) >= 4 hours 26 days (87%) Average usage (days used) 6 hours 20 minutes  AirSense 10 AutoSet Serial number [id number] Mode AutoSet Min Pressure 10 cmH2O Max Pressure 20 cmH2O EPR Off Response Standard  Therapy Pressure - cmH2O Median: 11.9 95th percentile: 14.3 Maximum: 15.3 Leaks - L/min Median: 3.5 95th percentile: 21.3 Maximum: 38.1 Events per hour AI: 0.5 HI: 1.1 AHI: 1.6 Apnea Index Central: 0.0 Obstructive: 0.3 Unknown: 0.2   HISTORY Heidi Landry is a 35 year old right-handed woman with an underlying medical history of irritable bowel syndrome, hypertension, diabetes, chronic back pain, history of thoracic spine fracture, history of A. fib, anxiety, adrenal adenoma, edema, carpal tunnel syndrome, varicose veins and morbid obesity with a BMI of over 50, who was diagnosed with obstructive sleep apnea in 2016.  She has been on CPAP therapy.  She brought her machine today but did not bring the power cord and we do not have a matching power cord unfortunately.  A CPAP compliance download was not possible.  Her sleep specialist, Dr. Sinda Du retired.  She had sleep study testing on 11/24/2015.  She was pregnant at the time and had difficulty tolerating CPAP.  She had a sleep study on 12/28/2015 which showed a total AHI of 50.5/h.  Oxygen desaturation nadir was 74%.  Trial of AutoPap was recommended at  the time.  She lives with her husband and 2 sons, ages 6 and 62. She currently does not work, she smokes 1 pack/day, she does not utilize any alcohol, drinks caffeine in the form of coffee, 1 to 3 cups daily and occasional diet soda. She reports compliance with her machine, she feels that she cannot sleep without it.  She had significant improvement of her sleep quality once she got on the machine.  She needs new supplies.  She particularly noticed an improvement in her nocturia and enuresis when she started using her machine. Her weight has been fluctuating, she has lost some weight over the past year.  She is motivated to continue to work on weight loss.  She has quit smoking in the past, she plans to quit smoking again. Bedtime is currently between 8 and 10 PM, rise time around 6 or earlier  REVIEW OF SYSTEMS: Out of a complete 14 system review of symptoms, the patient complains only of the following symptoms, and all other reviewed systems are negative.  FSS 23 ESS 8    ALLERGIES: No Known Allergies  HOME MEDICATIONS: Outpatient Medications Prior to Visit  Medication Sig Dispense Refill  . albuterol (VENTOLIN HFA) 108 (90 Base) MCG/ACT inhaler Inhale 2 puffs into the lungs every 6 (six) hours as needed for wheezing or shortness of breath. 8 g 0  . atorvastatin (LIPITOR) 20 MG tablet Take 1 tablet (20 mg total) by mouth daily. 90 tablet 2  . buPROPion (WELLBUTRIN SR) 150 MG 12 hr tablet Take 2 tablets (  300 mg total) by mouth daily. 180 tablet 2  . diclofenac (VOLTAREN) 75 MG EC tablet Take 1 tablet (75 mg total) by mouth 2 (two) times daily. 60 tablet 3  . DULoxetine (CYMBALTA) 60 MG capsule Take 1 capsule (60 mg total) by mouth daily. 90 capsule 3  . fexofenadine (ALLEGRA ALLERGY) 180 MG tablet Take 1 tablet (180 mg total) by mouth daily. 90 tablet 2  . fluticasone (FLONASE) 50 MCG/ACT nasal spray Place 2 sprays into both nostrils daily. 16 g 6  . gabapentin (NEURONTIN) 100 MG capsule Take 1  capsule (100 mg total) by mouth 3 (three) times daily. (Needs to be seen before next refill) 90 capsule 0  . hydrochlorothiazide (HYDRODIURIL) 12.5 MG tablet Take 1 tablet (12.5 mg total) by mouth daily. 90 tablet 2  . metFORMIN (GLUCOPHAGE-XR) 500 MG 24 hr tablet Take 2 tablets (1,000 mg total) by mouth daily. 180 tablet 1  . metoprolol succinate (TOPROL-XL) 25 MG 24 hr tablet TAKE 1 TABLET BY MOUTH ONCE DAILY WITH MEALS OR  IMMEDIATELY  FOLLOWING 90 tablet 1  . Multiple Vitamin (MULTIVITAMIN WITH MINERALS) TABS tablet Take 1 tablet by mouth daily. womens    . norethindrone (MICRONOR) 0.35 MG tablet Take 1 tablet by mouth once daily 28 tablet 12  . omeprazole (PRILOSEC) 40 MG capsule Take 1 capsule (40 mg total) by mouth daily. 90 capsule 4  . podofilox (CONDYLOX) 0.5 % gel Apply topically 2 (two) times daily. 3.5 g 11  . predniSONE (STERAPRED UNI-PAK 21 TAB) 10 MG (21) TBPK tablet Take as directed. 21 tablet 0  . Probiotic Product (PROBIOTIC DAILY PO) Take 1 capsule by mouth daily. Women's care    . Semaglutide,0.25 or 0.5MG/DOS, (OZEMPIC, 0.25 OR 0.5 MG/DOSE,) 2 MG/1.5ML SOPN Inject 0.25 mg into the skin once a week for 28 days, THEN 0.5 mg once a week for 28 days. 1.5 mL 0  . spironolactone (ALDACTONE) 100 MG tablet Take 1 tablet (100 mg total) by mouth daily. 90 tablet 1  . ursodiol (ACTIGALL) 500 MG tablet Take 2 tablets (1,000 mg total) by mouth 2 (two) times daily with a meal. 120 tablet 5  . chlorpheniramine-HYDROcodone (TUSSIONEX) 10-8 MG/5ML SUER Take 5 mLs by mouth 2 (two) times daily. 140 mL 0   No facility-administered medications prior to visit.    PAST MEDICAL HISTORY: Past Medical History:  Diagnosis Date  . Adrenal adenoma    right  . Anxiety   . Atrial fibrillation ([place])   . Carpal tunnel syndrome   . Carpal tunnel syndrome   . Carpal tunnel syndrome, bilateral   . Chest pain   . Chronic back pain   . Degenerative disc disease   . Edema   . Gestational diabetes  mellitus, antepartum   . Gestational diabetes mellitus, class A2/B 08/08/2015   Early 2hr: 84/186/117 @ 12wks   A1/B  Notified & dietician referral ordered 08/08/15   . Hypertension   . IBS (irritable bowel syndrome)   . Panic attacks   . Placental abruption in third trimester 12/07/2015  . Post partum depression   . Sleep apnea   . Spondylosis   . Tendonitis   . Thoracic spine fracture ([place]) 04/13/2012  . Varicose veins of left lower extremity with complications 61/44/3154    PAST SURGICAL HISTORY: Past Surgical History:  Procedure Laterality Date  . CESAREAN SECTION N/A 12/08/2015   Procedure: CESAREAN SECTION;  Surgeon: Jonnie Kind, MD;  Location: [facility];  Service:  Obstetrics;  Laterality: N/A;  . CHOLECYSTECTOMY    . KNEE SURGERY Right 01/29/2019   at [facility] in [place], [place]; s/p fall and deel leg laceration    FAMILY HISTORY: Family History  Problem Relation Age of Onset  . Fibromyalgia Mother   . Carpal tunnel syndrome Mother   . Hernia Father   . Lung cancer Father   . Colon cancer Father 28       partial colectomy  . Liver disease Father        Fatty liver; ? ETOH-related  . Hypertension Father   . Diabetes Maternal Grandmother   . Hypertension Maternal Grandmother   . Ovarian cancer Maternal Grandmother   . Cirrhosis Maternal Grandmother        alcohol  . Cancer Paternal Uncle   . Brain cancer Paternal Uncle   . Throat cancer Paternal Uncle   . Heart attack Maternal Grandfather   . Hyperlipidemia Paternal Grandmother   . Hypertension Paternal Grandmother   . Lung cancer Paternal Grandmother   . Thyroid disease Paternal Aunt     SOCIAL HISTORY: Social History   Socioeconomic History  . Marital status: Married    Spouse name: Not on file  . Number of children: 1  . Years of education: 58  . Highest education level: Not on file  Occupational History  . Occupation: disabled-SSI    Employer: [employer]  Tobacco Use   . Smoking status: Current Every Day Smoker    Packs/day: 0.50    Years: 9.00    Pack years: 4.50    Types: Cigarettes    Start date: 05/25/2006  . Smokeless tobacco: Never Used  Vaping Use  . Vaping Use: Never used  Substance and Sexual Activity  . Alcohol use: Not Currently    Alcohol/week: 0.0 standard drinks    Comment: rare  . Drug use: No  . Sexual activity: Yes    Birth control/protection: Pill  Other Topics Concern  . Not on file  Social History Narrative  . Not on file   Social Determinants of Health   Financial Resource Strain: Not on file  Food Insecurity: Not on file  Transportation Needs: Not on file  Physical Activity: Not on file  Stress: Not on file  Social Connections: Not on file  Intimate Partner Violence: Not on file      PHYSICAL EXAM  Vitals:   02/12/21 0833  BP: 129/66  Pulse: 93  Weight: (!) 343 lb (155.6 kg)  Height: 5' (1.524 m)   Body mass index is 66.99 kg/m.  Generalized: Well developed, in no acute distress  Chest: Lungs clear to auscultation bilaterally  Neurological examination  Mentation: Alert oriented to time, place, history taking. Follows all commands speech and language fluent Cranial nerve II-XII: Extraocular movements were full, visual field were full on confrontational test Head turning and shoulder shrug  were normal and symmetric. Motor: The motor testing reveals 5 over 5 strength of all 4 extremities. Good symmetric motor tone is noted throughout.  Sensory: Sensory testing is intact to soft touch on all 4 extremities. No evidence of extinction is noted.  Gait and station: Gait is normal.    DIAGNOSTIC DATA (LABS, IMAGING, TESTING) - I reviewed patient records, labs, notes, testing and imaging myself where available.  Lab Results  Component Value Date   WBC 21.9 (HH) 01/25/2021   HGB 12.8 01/25/2021   HCT 40.9 01/25/2021   MCV 82 01/25/2021  PLT 262 01/25/2021      Component Value Date/Time   NA 140  01/25/2021 1301   K 3.8 01/25/2021 1301   CL 98 01/25/2021 1301   CO2 26 01/25/2021 1301   GLUCOSE 121 (H) 01/25/2021 1301   GLUCOSE 80 04/11/2020 1115   BUN 20 01/25/2021 1301   CREATININE 0.63 01/25/2021 1301   CALCIUM 9.2 01/25/2021 1301   PROT 6.8 01/25/2021 1301   ALBUMIN 4.2 01/25/2021 1301   AST 18 01/25/2021 1301   ALT 33 (H) 01/25/2021 1301   ALKPHOS 111 01/25/2021 1301   BILITOT 0.5 01/25/2021 1301   GFRNONAA 117 01/25/2021 1301   GFRAA 134 01/25/2021 1301   Lab Results  Component Value Date   CHOL 130 01/30/2020   HDL 36 (L) 01/30/2020   LDLCALC 59 01/30/2020   TRIG 214 (H) 01/30/2020   CHOLHDL 3.6 01/30/2020   Lab Results  Component Value Date   HGBA1C 6.9 01/25/2021   No results found for: VITAMINB12 Lab Results  Component Value Date   TSH 1.570 10/28/2019      ASSESSMENT AND PLAN 35 y.o. year old female  has a past medical history of Adrenal adenoma, Anxiety, Atrial fibrillation ([place]), Carpal tunnel syndrome, Carpal tunnel syndrome, Carpal tunnel syndrome, bilateral, Chest pain, Chronic back pain, Degenerative disc disease, Edema, Gestational diabetes mellitus, antepartum, Gestational diabetes mellitus, class A2/B (08/08/2015), Hypertension, IBS (irritable bowel syndrome), Panic attacks, Placental abruption in third trimester (12/07/2015), Post partum depression, Sleep apnea, Spondylosis, Tendonitis, Thoracic spine fracture ([place]) (04/13/2012), and Varicose veins of left lower extremity with complications (93/79/0240). here with:  1. OSA on CPAP  - CPAP compliance excellent - Good treatment of AHI  - Encourage patient to use CPAP nightly and > 4 hours each night - F/U in 1 year or sooner if needed   I spent 20 minutes of face-to-face and non-face-to-face time with patient.  This included previsit chart review, lab review, study review, order entry, electronic health record documentation, patient education.  Ward Givens, MSN, NP-C 02/12/2021, 8:50  AM [facility] [address] [place], [place] [address] [phone]  I reviewed the above note and documentation by the Nurse Practitioner and agree with the history, exam, assessment and plan as outlined above. I was available for consultation. Star Age, MD, PhD [facility])

## 2021-02-12 NOTE — Patient Instructions (Signed)

## 2021-02-20 DIAGNOSIS — R454 Irritability and anger: Secondary | ICD-10-CM | POA: Diagnosis not present

## 2021-02-20 DIAGNOSIS — F411 Generalized anxiety disorder: Secondary | ICD-10-CM | POA: Diagnosis not present

## 2021-02-20 DIAGNOSIS — F331 Major depressive disorder, recurrent, moderate: Secondary | ICD-10-CM | POA: Diagnosis not present

## 2021-02-20 DIAGNOSIS — G4700 Insomnia, unspecified: Secondary | ICD-10-CM | POA: Diagnosis not present

## 2021-02-25 ENCOUNTER — Other Ambulatory Visit: Payer: Self-pay | Admitting: Family

## 2021-02-25 DIAGNOSIS — M5442 Lumbago with sciatica, left side: Secondary | ICD-10-CM

## 2021-02-25 DIAGNOSIS — G8929 Other chronic pain: Secondary | ICD-10-CM

## 2021-02-26 DIAGNOSIS — G4733 Obstructive sleep apnea (adult) (pediatric): Secondary | ICD-10-CM | POA: Diagnosis not present

## 2021-03-02 ENCOUNTER — Other Ambulatory Visit: Payer: Self-pay | Admitting: Family

## 2021-03-04 ENCOUNTER — Ambulatory Visit (INDEPENDENT_AMBULATORY_CARE_PROVIDER_SITE_OTHER): Payer: Medicaid Other | Admitting: Adult Health

## 2021-03-04 ENCOUNTER — Encounter: Payer: Self-pay | Admitting: Women's Health

## 2021-03-04 ENCOUNTER — Other Ambulatory Visit: Payer: Self-pay

## 2021-03-04 VITALS — BP 136/85 | HR 97 | Ht 60.0 in | Wt 347.6 lb

## 2021-03-04 DIAGNOSIS — L0292 Furuncle, unspecified: Secondary | ICD-10-CM | POA: Insufficient documentation

## 2021-03-04 DIAGNOSIS — B369 Superficial mycosis, unspecified: Secondary | ICD-10-CM | POA: Insufficient documentation

## 2021-03-04 MED ORDER — SILVER SULFADIAZINE 1 % EX CREA: 1.0000 "application " | TOPICAL_CREAM | Freq: Two times a day (BID) | CUTANEOUS | 0 refills | Status: AC

## 2021-03-04 MED ORDER — FLUCONAZOLE 100 MG PO TABS: 100.0000 mg | ORAL_TABLET | Freq: Every day | ORAL | 0 refills | Status: DC

## 2021-03-04 MED ORDER — SULFAMETHOXAZOLE-TRIMETHOPRIM 800-160 MG PO TABS: 1.0000 | ORAL_TABLET | Freq: Two times a day (BID) | ORAL | 0 refills | Status: DC

## 2021-03-04 MED ORDER — NYSTATIN 100000 UNIT/GM EX POWD: 1.0000 "application " | Freq: Two times a day (BID) | CUTANEOUS | 3 refills | Status: DC

## 2021-03-04 MED ORDER — PODOFILOX 0.5 % EX GEL: Freq: Two times a day (BID) | CUTANEOUS | 11 refills | Status: DC

## 2021-03-04 NOTE — Progress Notes (Signed)
Subjective:     Patient ID: Heidi Landry, female   DOB: February 23, 1986, 35 y.o.   MRN: [id number]  HPI Heidi Landry is a 35 year old white female.married, G1P0101, in for ?boil left labia and been on and off for 4 weeks now and has yeast under panniculus and she requests refill on condylox. She has diabetes and has had cellulitis in the past she says.  PCP is Kayren Eaves NP.  Review of Systems +boil on and off for 4 weeks was worse last week +yeast under panniculus, she says it is tender Reviewed past medical,surgical, social and family history. Reviewed medications and allergies.     Objective:   Physical Exam BP 136/85 (BP Location: Left Arm, Patient Position: Sitting, Cuff Size: Large)   Pulse 97   Ht 5' (1.524 m)   Wt (!) 347 lb 9.6 oz (157.7 kg)   LMP 02/16/2021 (Approximate)   BMI 67.89 kg/m  Skin warm and dry.Pelvic: external genitalia is normal in appearance has redness left labia with 1 cm tender area, no enduration,and has yeast under large panniculus  Pt gave permission for exam without chaperone. Fall risk is low  Upstream - 03/04/21 1012      Pregnancy Intention Screening   Does the patient want to become pregnant in the next year? No    Does the patient's partner want to become pregnant in the next year? No    Would the patient like to discuss contraceptive options today? No      Contraception Wrap Up   Current Method Oral Contraceptive    End Method Oral Contraceptive    Contraception Counseling Provided No             Assessment:     1. Boil Will rx septra ds and silvadene Use bar soap and wash rag   2. Superficial fungus infection of skin Will rx diflucan and nystatin powder Keep clean and dry Use fan at times    Plan:     Meds ordered this encounter  Medications  . sulfamethoxazole-trimethoprim (BACTRIM DS) 800-160 MG tablet    Sig: Take 1 tablet by mouth 2 (two) times daily. Take 1 bid    Dispense:  28 tablet    Refill:  0    Order Specific Question:    Supervising Provider    Answer:   EURE, LUTHER H [[id number]]  . silver sulfADIAZINE (SILVADENE) 1 % cream    Sig: Apply 1 application topically 2 (two) times daily.    Dispense:  50 g    Refill:  0    Order Specific Question:   Supervising Provider    Answer:   EURE, LUTHER H [[id number]]  . fluconazole (DIFLUCAN) 100 MG tablet    Sig: Take 1 tablet (100 mg total) by mouth daily.    Dispense:  10 tablet    Refill:  0    Order Specific Question:   Supervising Provider    Answer:   Elonda Husky, LUTHER H [[id number]]  . nystatin (MYCOSTATIN/NYSTOP) powder    Sig: Apply 1 application topically 2 (two) times daily.    Dispense:  60 g    Refill:  3    Order Specific Question:   Supervising Provider    Answer:   EURE, LUTHER H [[id number]]  . podofilox (CONDYLOX) 0.5 % gel    Sig: Apply topically 2 (two) times daily.    Dispense:  3.5 g    Refill:  11    Order Specific Question:  Supervising Provider    Answer:   Florian Buff [[id number]]  Follow up in 2 weeks or sooner if needed

## 2021-03-11 ENCOUNTER — Ambulatory Visit: Payer: Medicaid Other | Admitting: Family

## 2021-03-11 ENCOUNTER — Telehealth: Payer: Self-pay | Admitting: *Deleted

## 2021-03-11 DIAGNOSIS — J301 Allergic rhinitis due to pollen: Secondary | ICD-10-CM

## 2021-03-11 NOTE — Telephone Encounter (Signed)
PA came in for Key: [id number] Fexofenadine HCl 180MG tablets Sent to Plan today

## 2021-03-11 NOTE — Telephone Encounter (Signed)
I got an error message - pt not found   Pharm had sent with wrong mem ID  I changed mem ID to match what we have on file and it was approved immediately   Approved today PA Case: [id number], Status: Approved, Coverage Starts on: 03/11/2021 12:00:00 AM, Coverage Ends on: 03/11/2022 12:00  WM called and aware

## 2021-03-12 ENCOUNTER — Encounter: Payer: Self-pay | Admitting: Family

## 2021-03-13 DIAGNOSIS — G4700 Insomnia, unspecified: Secondary | ICD-10-CM | POA: Diagnosis not present

## 2021-03-13 DIAGNOSIS — F331 Major depressive disorder, recurrent, moderate: Secondary | ICD-10-CM | POA: Diagnosis not present

## 2021-03-13 DIAGNOSIS — R454 Irritability and anger: Secondary | ICD-10-CM | POA: Diagnosis not present

## 2021-03-18 ENCOUNTER — Ambulatory Visit: Payer: Medicaid Other | Admitting: Family

## 2021-03-18 ENCOUNTER — Encounter: Payer: Self-pay | Admitting: Family

## 2021-03-18 ENCOUNTER — Other Ambulatory Visit: Payer: Self-pay

## 2021-03-18 VITALS — BP 117/68 | HR 97 | Temp 98.0°F | Ht 60.0 in | Wt 349.8 lb

## 2021-03-18 DIAGNOSIS — F411 Generalized anxiety disorder: Secondary | ICD-10-CM | POA: Diagnosis not present

## 2021-03-18 DIAGNOSIS — F17200 Nicotine dependence, unspecified, uncomplicated: Secondary | ICD-10-CM

## 2021-03-18 DIAGNOSIS — F339 Major depressive disorder, recurrent, unspecified: Secondary | ICD-10-CM

## 2021-03-18 DIAGNOSIS — D72829 Elevated white blood cell count, unspecified: Secondary | ICD-10-CM

## 2021-03-18 DIAGNOSIS — M792 Neuralgia and neuritis, unspecified: Secondary | ICD-10-CM

## 2021-03-18 DIAGNOSIS — E1159 Type 2 diabetes mellitus with other circulatory complications: Secondary | ICD-10-CM | POA: Diagnosis not present

## 2021-03-18 DIAGNOSIS — I152 Hypertension secondary to endocrine disorders: Secondary | ICD-10-CM

## 2021-03-18 DIAGNOSIS — Z6841 Body Mass Index (BMI) 40.0 and over, adult: Secondary | ICD-10-CM

## 2021-03-18 NOTE — Progress Notes (Signed)
Subjective:    Patient ID: Heidi Landry, female    DOB: [birth year], 35 y.o.   MRN: [id number]  Chief Complaint  Patient presents with  . Diabetes  . Hypertension    6 weeks    PT presents to the office today to recheck GAD and depression. Her last visit, we stopped her zoloft and started her on Cymbalta. We also did a referral to [facility]. They did a gene test and is actually weaning her off of her Cymbalta and trying a different medication.    Her BP is elevated today.   She has a Ambulance person appointment June.   She had Leukocytosis and we did a referral to Hematologists. The was thought to be related to steroid and recent COVID. We will recheck today.   She is also complaining of generalized nerve pain in feet, legs, buttocks, chest, and arms of a 10 out 10. She reports she has seen a neurologists in the past and would like a referral placed again.  Diabetes She presents for her follow-up diabetic visit. She has type 2 diabetes mellitus. Hypoglycemia symptoms include nervousness/anxiousness. Pertinent negatives for diabetes include no blurred vision. Pertinent negatives for diabetic complications include no CVA or heart disease. Risk factors for coronary artery disease include dyslipidemia, diabetes mellitus, hypertension and sedentary lifestyle. She is following a generally unhealthy diet. (Does check at home) An ACE inhibitor/angiotensin II receptor blocker is being taken. Eye exam is current.  Hypertension This is a chronic problem. The current episode started more than 1 year ago. The problem has been waxing and waning since onset. Associated symptoms include anxiety and palpitations. Pertinent negatives include no blurred vision, malaise/fatigue, peripheral edema or shortness of breath. The current treatment provides moderate improvement. There is no history of CVA.  Anxiety Presents for follow-up visit. Symptoms include depressed mood, excessive worry,  irritability, nervous/anxious behavior, palpitations and restlessness. Patient reports no shortness of breath. Symptoms occur most days. The severity of symptoms is moderate.    Depression        This is a chronic problem.  The current episode started more than 1 year ago.   The onset quality is gradual.   The problem occurs intermittently.  Associated symptoms include irritable and restlessness.  Associated symptoms include no helplessness and no hopelessness.  Past medical history includes anxiety.       Review of Systems  Constitutional: Positive for irritability. Negative for malaise/fatigue.  Eyes: Negative for blurred vision.  Respiratory: Negative for shortness of breath.   Cardiovascular: Positive for palpitations.  Psychiatric/Behavioral: Positive for depression. The patient is nervous/anxious.   All other systems reviewed and are negative.      Objective:   Physical Exam Vitals reviewed.  Constitutional:      General: She is irritable. She is not in acute distress.    Appearance: She is well-developed. She is obese.  HENT:     Head: Normocephalic and atraumatic.  Eyes:     Pupils: Pupils are equal, round, and reactive to light.  Neck:     Thyroid: No thyromegaly.  Cardiovascular:     Rate and Rhythm: Normal rate and regular rhythm.     Heart sounds: Normal heart sounds. No murmur heard.   Pulmonary:     Effort: Pulmonary effort is normal. No respiratory distress.     Breath sounds: Normal breath sounds. No wheezing.  Abdominal:     General: Bowel sounds are normal. There is no distension.  Palpations: Abdomen is soft.     Tenderness: There is no abdominal tenderness.  Musculoskeletal:        General: No tenderness. Normal range of motion.     Cervical back: Normal range of motion and neck supple.  Skin:    General: Skin is warm and dry.  Neurological:     Mental Status: She is alert and oriented to person, place, and time.     Cranial Nerves: No cranial  nerve deficit.     Deep Tendon Reflexes: Reflexes are normal and symmetric.  Psychiatric:        Behavior: Behavior normal.        Thought Content: Thought content normal.        Judgment: Judgment normal.       BP (!) 144/73   Pulse (!) 103   Temp 98 F (36.7 C) (Temporal)   Ht 5' (1.524 m)   Wt (!) 349 lb 12.8 oz (158.7 kg)   LMP 02/16/2021 (Approximate)   BMI 68.32 kg/m      Assessment & Plan:  Heidi Landry comes in today with chief complaint of Diabetes and Hypertension (6 weeks/)   Diagnosis and orders addressed:  1. Hypertension associated with type 2 diabetes mellitus ([place]) - CMP14+EGFR - CBC with Differential/Platelet  2. Morbid obesity with BMI of 60.0-69.9, adult (HCC) - CMP14+EGFR - CBC with Differential/Platelet  3. Smoker - CMP14+EGFR - CBC with Differential/Platelet  4. Generalized anxiety disorder - CMP14+EGFR - CBC with Differential/Platelet  5. Depression, recurrent ([place]) - CMP14+EGFR - CBC with Differential/Platelet  6. Leukocytosis, unspecified type - CMP14+EGFR - CBC with Differential/Platelet  7. Neuropathic pain - Ambulatory referral to Neurology   Labs pending Health Maintenance reviewed Diet and exercise encouraged  Follow up plan: 3 months and keep follow up with all specialists    Evelina Dun, FNP

## 2021-03-18 NOTE — Patient Instructions (Signed)
[email or web address]">  Generalized Anxiety Disorder, Adult Generalized anxiety disorder (GAD) is a mental health condition. Unlike normal worries, anxiety related to GAD is not triggered by a specific event. These worries do not fade or get better with time. GAD interferes with relationships, work, and school. GAD symptoms can vary from mild to severe. People with severe GAD can have intense waves of anxiety with physical symptoms that are similar to panic attacks. What are the causes? The exact cause of GAD is not known, but the following are believed to have an impact:  Differences in natural brain chemicals.  Genes passed down from parents to children.  Differences in the way threats are perceived.  Development during childhood.  Personality. What increases the risk? The following factors may make you more likely to develop this condition:  Being female.  Having a family history of anxiety disorders.  Being very shy.  Experiencing very stressful life events, such as the death of a loved one.  Having a very stressful family environment. What are the signs or symptoms? People with GAD often worry excessively about many things in their lives, such as their health and family. Symptoms may also include:  Mental and emotional symptoms: ? Worrying excessively about natural disasters. ? Fear of being late. ? Difficulty concentrating. ? Fears that others are judging your performance.  Physical symptoms: ? Fatigue. ? Headaches, muscle tension, muscle twitches, trembling, or feeling shaky. ? Feeling like your heart is pounding or beating very fast. ? Feeling out of breath or like you cannot take a deep breath. ? Having trouble falling asleep or staying asleep, or experiencing restlessness. ? Sweating. ? Nausea, diarrhea, or irritable bowel syndrome (IBS).  Behavioral symptoms: ? Experiencing erratic moods or irritability. ? Avoidance of new situations. ? Avoidance of  people. ? Extreme difficulty making decisions. How is this diagnosed? This condition is diagnosed based on your symptoms and medical history. You will also have a physical exam. Your health care provider may perform tests to rule out other possible causes of your symptoms. To be diagnosed with GAD, a person must have anxiety that:  Is out of his or her control.  Affects several different aspects of his or her life, such as work and relationships.  Causes distress that makes him or her unable to take part in normal activities.  Includes at least three symptoms of GAD, such as restlessness, fatigue, trouble concentrating, irritability, muscle tension, or sleep problems. Before your health care provider can confirm a diagnosis of GAD, these symptoms must be present more days than they are not, and they must last for 6 months or longer. How is this treated? This condition may be treated with:  Medicine. Antidepressant medicine is usually prescribed for long-term daily control. Anti-anxiety medicines may be added in severe cases, especially when panic attacks occur.  Talk therapy (psychotherapy). Certain types of talk therapy can be helpful in treating GAD by providing support, education, and guidance. Options include: ? Cognitive behavioral therapy (CBT). People learn coping skills and self-calming techniques to ease their physical symptoms. They learn to identify unrealistic thoughts and behaviors and to replace them with more appropriate thoughts and behaviors. ? Acceptance and commitment therapy (ACT). This treatment teaches people how to be mindful as a way to cope with unwanted thoughts and feelings. ? Biofeedback. This process trains you to manage your body's response (physiological response) through breathing techniques and relaxation methods. You will work with a therapist while machines are used to monitor your physical   symptoms.  Stress management techniques. These include yoga,  meditation, and exercise. A mental health specialist can help determine which treatment is best for you. Some people see improvement with one type of therapy. However, other people require a combination of therapies.   Follow these instructions at home: Lifestyle  Maintain a consistent routine and schedule.  Anticipate stressful situations. Create a plan, and allow extra time to work with your plan.  Practice stress management or self-calming techniques that you have learned from your therapist or your health care provider. General instructions  Take over-the-counter and prescription medicines only as told by your health care provider.  Understand that you are likely to have setbacks. Accept this and be kind to yourself as you persist to take better care of yourself.  Recognize and accept your accomplishments, even if you judge them as small.  Keep all follow-up visits as told by your health care provider. This is important. Contact a health care provider if:  Your symptoms do not get better.  Your symptoms get worse.  You have signs of depression, such as: ? A persistently sad or irritable mood. ? Loss of enjoyment in activities that used to bring you joy. ? Change in weight or eating. ? Changes in sleeping habits. ? Avoiding friends or family members. ? Loss of energy for normal tasks. ? Feelings of guilt or worthlessness. Get help right away if:  You have serious thoughts about hurting yourself or others. If you ever feel like you may hurt yourself or others, or have thoughts about taking your own life, get help right away. Go to your nearest emergency department or:  Call your local emergency services (911 in the [place]  Call a suicide crisis helpline, such as the National Suicide Prevention Lifeline at 1-800-273-8255. This is open 24 hours a day in the [place]  Text the Crisis Text Line at 741741 (in the [place] Summary  Generalized anxiety disorder (GAD) is a mental  health condition that involves worry that is not triggered by a specific event.  People with GAD often worry excessively about many things in their lives, such as their health and family.  GAD may cause symptoms such as restlessness, trouble concentrating, sleep problems, frequent sweating, nausea, diarrhea, headaches, and trembling or muscle twitching.  A mental health specialist can help determine which treatment is best for you. Some people see improvement with one type of therapy. However, other people require a combination of therapies. This information is not intended to replace advice given to you by your health care provider. Make sure you discuss any questions you have with your health care provider. Document Revised: 10/05/2019 Document Reviewed: 10/05/2019 Elsevier Patient Education  2021 Elsevier Inc.  

## 2021-03-19 ENCOUNTER — Telehealth (HOSPITAL_COMMUNITY): Payer: Self-pay

## 2021-03-19 ENCOUNTER — Ambulatory Visit: Payer: Medicaid Other | Admitting: Adult Health

## 2021-03-19 LAB — CMP14+EGFR
ALT: 61 IU/L — ABNORMAL HIGH (ref 0–32)
AST: 46 IU/L — ABNORMAL HIGH (ref 0–40)
Albumin/Globulin Ratio: 1.7 (ref 1.2–2.2)
Albumin: 4.5 g/dL (ref 3.8–4.8)
Alkaline Phosphatase: 139 IU/L — ABNORMAL HIGH (ref 44–121)
BUN/Creatinine Ratio: 21 (ref 9–23)
BUN: 13 mg/dL (ref 6–20)
Bilirubin Total: 0.4 mg/dL (ref 0.0–1.2)
CO2: 24 mmol/L (ref 20–29)
Calcium: 9.4 mg/dL (ref 8.7–10.2)
Chloride: 95 mmol/L — ABNORMAL LOW (ref 96–106)
Creatinine, Ser: 0.62 mg/dL (ref 0.57–1.00)
Globulin, Total: 2.6 g/dL (ref 1.5–4.5)
Glucose: 174 mg/dL — ABNORMAL HIGH (ref 65–99)
Potassium: 4.4 mmol/L (ref 3.5–5.2)
Sodium: 137 mmol/L (ref 134–144)
Total Protein: 7.1 g/dL (ref 6.0–8.5)
eGFR: 119 mL/min/{1.73_m2} (ref >59)

## 2021-03-19 LAB — CBC WITH DIFFERENTIAL/PLATELET
Basophils Absolute: 0.1 10*3/uL (ref 0.0–0.2)
Basos: 0 %
EOS (ABSOLUTE): 0.2 10*3/uL (ref 0.0–0.4)
Eos: 1 %
Hematocrit: 40.1 % (ref 34.0–46.6)
Hemoglobin: 12.7 g/dL (ref 11.1–15.9)
Immature Grans (Abs): 0.1 10*3/uL (ref 0.0–0.1)
Immature Granulocytes: 1 %
Lymphocytes Absolute: 3.4 10*3/uL — ABNORMAL HIGH (ref 0.7–3.1)
Lymphs: 26 %
MCH: 26 pg — ABNORMAL LOW (ref 26.6–33.0)
MCHC: 31.7 g/dL (ref 31.5–35.7)
MCV: 82 fL (ref 79–97)
Monocytes Absolute: 0.7 10*3/uL (ref 0.1–0.9)
Monocytes: 6 %
Neutrophils Absolute: 8.5 10*3/uL — ABNORMAL HIGH (ref 1.4–7.0)
Neutrophils: 66 %
Platelets: 238 10*3/uL (ref 150–450)
RBC: 4.89 x10E6/uL (ref 3.77–5.28)
RDW: 15.8 % — ABNORMAL HIGH (ref 11.7–15.4)
WBC: 12.8 10*3/uL — ABNORMAL HIGH (ref 3.4–10.8)

## 2021-03-20 NOTE — Telephone Encounter (Signed)
This nurse called patient related to message that she was concerned about her lab results.  This nurse spoke with Dr. Delton Coombes and was advised to tell patient that her levels have improved from previous results and there is no need to worry at this time.  No further orders given. This nurse called patient and there was no answer.  Left message for patient.

## 2021-04-05 ENCOUNTER — Ambulatory Visit: Payer: Medicaid Other | Admitting: Adult Health

## 2021-04-05 ENCOUNTER — Telehealth: Payer: Self-pay | Admitting: Adult Health

## 2021-04-05 NOTE — Telephone Encounter (Signed)
Patient called stating that she is not able to make her appointment today due to babysitting issues but she does want to inform Anderson Malta that the medication is working great and she has not issues. Pt states that she can not reschedule any time soon because she is going out of town for a little while. Just and FYI that she is doing well with the medication.

## 2021-04-08 ENCOUNTER — Other Ambulatory Visit: Payer: Self-pay | Admitting: Family

## 2021-04-08 DIAGNOSIS — J301 Allergic rhinitis due to pollen: Secondary | ICD-10-CM

## 2021-04-11 DIAGNOSIS — F331 Major depressive disorder, recurrent, moderate: Secondary | ICD-10-CM | POA: Diagnosis not present

## 2021-04-11 DIAGNOSIS — R454 Irritability and anger: Secondary | ICD-10-CM | POA: Diagnosis not present

## 2021-04-11 DIAGNOSIS — G4700 Insomnia, unspecified: Secondary | ICD-10-CM | POA: Diagnosis not present

## 2021-04-12 ENCOUNTER — Other Ambulatory Visit: Payer: Self-pay | Admitting: Family

## 2021-04-15 ENCOUNTER — Other Ambulatory Visit: Payer: Self-pay | Admitting: Obstetrics & Gynecology

## 2021-04-15 ENCOUNTER — Other Ambulatory Visit: Payer: Self-pay | Admitting: Family

## 2021-04-15 DIAGNOSIS — I10 Essential (primary) hypertension: Secondary | ICD-10-CM

## 2021-04-15 DIAGNOSIS — E1159 Type 2 diabetes mellitus with other circulatory complications: Secondary | ICD-10-CM

## 2021-04-16 ENCOUNTER — Other Ambulatory Visit: Payer: Self-pay

## 2021-04-16 ENCOUNTER — Ambulatory Visit: Payer: Medicaid Other | Admitting: Family

## 2021-04-16 ENCOUNTER — Encounter: Payer: Self-pay | Admitting: Family

## 2021-04-16 VITALS — BP 154/94 | HR 103 | Temp 98.1°F | Ht 60.0 in | Wt 354.2 lb

## 2021-04-16 DIAGNOSIS — E1169 Type 2 diabetes mellitus with other specified complication: Secondary | ICD-10-CM | POA: Diagnosis not present

## 2021-04-16 DIAGNOSIS — M5442 Lumbago with sciatica, left side: Secondary | ICD-10-CM | POA: Diagnosis not present

## 2021-04-16 DIAGNOSIS — I10 Essential (primary) hypertension: Secondary | ICD-10-CM

## 2021-04-16 DIAGNOSIS — K7581 Nonalcoholic steatohepatitis (NASH): Secondary | ICD-10-CM

## 2021-04-16 DIAGNOSIS — K219 Gastro-esophageal reflux disease without esophagitis: Secondary | ICD-10-CM

## 2021-04-16 DIAGNOSIS — E1159 Type 2 diabetes mellitus with other circulatory complications: Secondary | ICD-10-CM | POA: Diagnosis not present

## 2021-04-16 DIAGNOSIS — F339 Major depressive disorder, recurrent, unspecified: Secondary | ICD-10-CM

## 2021-04-16 DIAGNOSIS — M5441 Lumbago with sciatica, right side: Secondary | ICD-10-CM

## 2021-04-16 DIAGNOSIS — G4733 Obstructive sleep apnea (adult) (pediatric): Secondary | ICD-10-CM | POA: Diagnosis not present

## 2021-04-16 DIAGNOSIS — I152 Hypertension secondary to endocrine disorders: Secondary | ICD-10-CM | POA: Diagnosis not present

## 2021-04-16 DIAGNOSIS — G8929 Other chronic pain: Secondary | ICD-10-CM

## 2021-04-16 DIAGNOSIS — U071 COVID-19: Secondary | ICD-10-CM

## 2021-04-16 DIAGNOSIS — E781 Pure hyperglyceridemia: Secondary | ICD-10-CM | POA: Diagnosis not present

## 2021-04-16 DIAGNOSIS — E785 Hyperlipidemia, unspecified: Secondary | ICD-10-CM

## 2021-04-16 DIAGNOSIS — F411 Generalized anxiety disorder: Secondary | ICD-10-CM

## 2021-04-16 DIAGNOSIS — F17200 Nicotine dependence, unspecified, uncomplicated: Secondary | ICD-10-CM

## 2021-04-16 DIAGNOSIS — Z6841 Body Mass Index (BMI) 40.0 and over, adult: Secondary | ICD-10-CM

## 2021-04-16 LAB — BAYER DCA HB A1C WAIVED: HB A1C (BAYER DCA - WAIVED): 8.4 % — ABNORMAL HIGH (ref <7.0)

## 2021-04-16 MED ORDER — GABAPENTIN 100 MG PO CAPS: 100.0000 mg | ORAL_CAPSULE | Freq: Three times a day (TID) | ORAL | 0 refills | Status: DC

## 2021-04-16 MED ORDER — SPIRONOLACTONE 100 MG PO TABS: 100.0000 mg | ORAL_TABLET | Freq: Every day | ORAL | 1 refills | Status: DC

## 2021-04-16 MED ORDER — HYDROCHLOROTHIAZIDE 12.5 MG PO TABS: 12.5000 mg | ORAL_TABLET | Freq: Every day | ORAL | 2 refills | Status: DC

## 2021-04-16 MED ORDER — ALBUTEROL SULFATE HFA 108 (90 BASE) MCG/ACT IN AERS: 2.0000 | INHALATION_SPRAY | Freq: Four times a day (QID) | RESPIRATORY_TRACT | 0 refills | Status: AC | PRN

## 2021-04-16 MED ORDER — ATORVASTATIN CALCIUM 20 MG PO TABS: 20.0000 mg | ORAL_TABLET | Freq: Every day | ORAL | 2 refills | Status: DC

## 2021-04-16 MED ORDER — OMEPRAZOLE 40 MG PO CPDR: 40.0000 mg | DELAYED_RELEASE_CAPSULE | Freq: Every day | ORAL | 4 refills | Status: DC

## 2021-04-16 MED ORDER — FLUTICASONE PROPIONATE 50 MCG/ACT NA SUSP: 2.0000 | Freq: Every day | NASAL | 6 refills | Status: DC

## 2021-04-16 MED ORDER — DULOXETINE HCL 60 MG PO CPEP: 60.0000 mg | ORAL_CAPSULE | Freq: Every day | ORAL | 3 refills | Status: DC

## 2021-04-16 MED ORDER — METOPROLOL SUCCINATE ER 25 MG PO TB24: ORAL_TABLET | ORAL | 1 refills | Status: DC

## 2021-04-16 MED ORDER — DICLOFENAC SODIUM 75 MG PO TBEC: 75.0000 mg | DELAYED_RELEASE_TABLET | Freq: Two times a day (BID) | ORAL | 3 refills | Status: DC

## 2021-04-16 NOTE — Progress Notes (Signed)
Subjective:    Patient ID: Heidi Landry, female    DOB: October 21, 1986, 35 y.o.   MRN: [id number]  Chief Complaint  Patient presents with  . Pain Management  . Diabetes  . Hypertension   PT presents to the office today for chronic follow up. She is followed by GI  Every 3 months for NASH. She is followed by [facility] every 4-6 weeks for GAD and Depression.    Her BP is elevated today, but states she has not slept with her CPAP.   She has a bariatric surgeon appointment in a few months.   She had Leukocytosis and we did a referral to Hematologists. The was thought to be related to steroid and recent COVID. We will recheck today.   She is also complaining of generalized nerve pain in feet, legs, buttocks, chest, and arms of a 8 out 10. She reports she has seen a neurologists in the past and has a follow up scheduled.  Diabetes She presents for her follow-up diabetic visit. She has type 2 diabetes mellitus. Her disease course has been stable. Associated symptoms include blurred vision and foot paresthesias. Symptoms are stable. Diabetic complications include heart disease, nephropathy and peripheral neuropathy. Risk factors for coronary artery disease include dyslipidemia, diabetes mellitus, hypertension and sedentary lifestyle. She is following a generally unhealthy diet. (Does not check at home)  Hypertension This is a chronic problem. The current episode started more than 1 year ago. The problem has been waxing and waning since onset. The problem is uncontrolled. Associated symptoms include anxiety and blurred vision. Pertinent negatives include no malaise/fatigue, peripheral edema or shortness of breath. Risk factors for coronary artery disease include dyslipidemia, diabetes mellitus, obesity and sedentary lifestyle. The current treatment provides moderate improvement.  Hyperlipidemia This is a chronic problem. The current episode started more than 1 year ago. The problem is  uncontrolled. Exacerbating diseases include obesity. Pertinent negatives include no shortness of breath.  Gastroesophageal Reflux She complains of belching and heartburn. This is a chronic problem. The symptoms are aggravated by certain foods. She has tried a PPI for the symptoms. The treatment provided moderate relief.  Depression        This is a chronic problem.  The current episode started more than 1 year ago.   The onset quality is gradual.   The problem occurs intermittently.  Associated symptoms include helplessness, hopelessness, irritable, restlessness and sad.  Past medical history includes anxiety.   Anxiety Presents for follow-up visit. Symptoms include restlessness. Patient reports no shortness of breath.    OSA  Has not been able to use BPAP because dog chewed mask.     Review of Systems  Constitutional: Negative for malaise/fatigue.  Eyes: Positive for blurred vision.  Respiratory: Negative for shortness of breath.   Gastrointestinal: Positive for heartburn.  Psychiatric/Behavioral: Positive for depression.  All other systems reviewed and are negative.      Objective:   Physical Exam Vitals reviewed.  Constitutional:      General: She is irritable. She is not in acute distress.    Appearance: She is well-developed. She is obese.  HENT:     Head: Normocephalic and atraumatic.     Right Ear: Tympanic membrane normal.     Left Ear: Tympanic membrane normal.  Eyes:     Pupils: Pupils are equal, round, and reactive to light.  Neck:     Thyroid: No thyromegaly.  Cardiovascular:     Rate and Rhythm: Normal rate and  regular rhythm.     Heart sounds: Normal heart sounds. No murmur heard.   Pulmonary:     Effort: Pulmonary effort is normal. No respiratory distress.     Breath sounds: Normal breath sounds. No wheezing.  Abdominal:     General: Bowel sounds are normal. There is no distension.     Palpations: Abdomen is soft.     Tenderness: There is no abdominal  tenderness.  Musculoskeletal:        General: No tenderness.     Cervical back: Normal range of motion and neck supple.     Comments: Pain in lumbar with flexion and extension  Skin:    General: Skin is warm and dry.  Neurological:     Mental Status: She is alert and oriented to person, place, and time.     Cranial Nerves: No cranial nerve deficit.     Deep Tendon Reflexes: Reflexes are normal and symmetric.  Psychiatric:        Behavior: Behavior normal.        Thought Content: Thought content normal.        Judgment: Judgment normal.       BP (!) 154/94   Pulse (!) 103   Temp 98.1 F (36.7 C) (Temporal)   Ht 5' (1.524 m)   Wt (!) 354 lb 3.2 oz (160.7 kg)   BMI 69.17 kg/m      Assessment & Plan:

## 2021-04-16 NOTE — Patient Instructions (Signed)
Leory Plowman and Daroff's neurology in clinical practice (8th ed., pp. 9563- [phone]). Elsevier."> Goldman-Cecil medicine (26th ed., pp. 2489- 2501). Elsevier.">  Peripheral Neuropathy Peripheral neuropathy is a type of nerve damage. It affects nerves that carry signals between the spinal cord and the arms, legs, and the rest of the body (peripheral nerves). It does not affect nerves in the spinal cord or brain. In peripheral neuropathy, one nerve or a group of nerves may be damaged. Peripheral neuropathy is a broad category that includes many specific nerve disorders, like diabetic neuropathy, hereditary neuropathy, and carpal tunnel syndrome. What are the causes? This condition may be caused by:  Diabetes. This is the most common cause of peripheral neuropathy.  Nerve injury.  Pressure or stress on a nerve that lasts a long time.  Lack (deficiency) of B vitamins. This can result from alcoholism, poor diet, or a restricted diet.  Infections.  Autoimmune diseases, such as rheumatoid arthritis and systemic lupus erythematosus.  Nerve diseases that are passed from parent to child (inherited).  Some medicines, such as cancer medicines (chemotherapy).  Poisonous (toxic) substances, such as lead and mercury.  Too little blood flowing to the legs.  Kidney disease.  Thyroid disease. In some cases, the cause of this condition is not known. What are the signs or symptoms? Symptoms of this condition depend on which of your nerves is damaged. Common symptoms include:  Loss of feeling (numbness) in the feet, hands, or both.  Tingling in the feet, hands, or both.  Burning pain.  Very sensitive skin.  Weakness.  Not being able to move a part of the body (paralysis).  Muscle twitching.  Clumsiness or poor coordination.  Loss of balance.  Not being able to control your bladder.  Feeling dizzy.  Sexual problems. How is this diagnosed? Diagnosing and finding the cause of peripheral  neuropathy can be difficult. Your health care provider will take your medical history and do a physical exam. A neurological exam will also be done. This involves checking things that are affected by your brain, spinal cord, and nerves (nervous system). For example, your health care provider will check your reflexes, how you move, and what you can feel. You may have other tests, such as:  Blood tests.  Electromyogram (EMG) and nerve conduction tests. These tests check nerve function and how well the nerves are controlling the muscles.  Imaging tests, such as CT scans or MRI to rule out other causes of your symptoms.  Removing a small piece of nerve to be examined in a lab (nerve biopsy).  Removing and examining a small amount of the fluid that surrounds the brain and spinal cord (lumbar puncture). How is this treated? Treatment for this condition may involve:  Treating the underlying cause of the neuropathy, such as diabetes, kidney disease, or vitamin deficiencies.  Stopping medicines that can cause neuropathy, such as chemotherapy.  Medicine to help relieve pain. Medicines may include: ? Prescription or over-the-counter pain medicine. ? Antiseizure medicine. ? Antidepressants. ? Pain-relieving patches that are applied to painful areas of skin.  Surgery to relieve pressure on a nerve or to destroy a nerve that is causing pain.  Physical therapy to help improve movement and balance.  Devices to help you move around (assistive devices). Follow these instructions at home: Medicines  Take over-the-counter and prescription medicines only as told by your health care provider. Do not take any other medicines without first asking your health care provider.  Do not drive or use heavy  machinery while taking prescription pain medicine. Lifestyle  Do not use any products that contain nicotine or tobacco, such as cigarettes and e-cigarettes. Smoking keeps blood from reaching damaged nerves.  If you need help quitting, ask your health care provider.  Avoid or limit alcohol. Too much alcohol can cause a vitamin B deficiency, and vitamin B is needed for healthy nerves.  Eat a healthy diet. This includes: ? Eating foods that are high in fiber, such as fresh fruits and vegetables, whole grains, and beans. ? Limiting foods that are high in fat and processed sugars, such as fried or sweet foods.   General instructions  If you have diabetes, work closely with your health care provider to keep your blood sugar under control.  If you have numbness in your feet: ? Check every day for signs of injury or infection. Watch for redness, warmth, and swelling. ? Wear padded socks and comfortable shoes. These help protect your feet.  Develop a good support system. Living with peripheral neuropathy can be stressful. Consider talking with a mental health specialist or joining a support group.  Use assistive devices and attend physical therapy as told by your health care provider. This may include using a walker or a cane.  Keep all follow-up visits as told by your health care provider. This is important.   Contact a health care provider if:  You have new signs or symptoms of peripheral neuropathy.  You are struggling emotionally from dealing with peripheral neuropathy.  Your pain is not well-controlled. Get help right away if:  You have an injury or infection that is not healing normally.  You develop new weakness in an arm or leg.  You have fallen or do so frequently. Summary  Peripheral neuropathy is when the nerves in the arms, or legs are damaged, resulting in numbness, weakness, or pain.  There are many causes of peripheral neuropathy, including diabetes, pinched nerves, vitamin deficiencies, autoimmune disease, and hereditary conditions.  Diagnosing and finding the cause of peripheral neuropathy can be difficult. Your health care provider will take your medical history, do a  physical exam, and do tests, including blood tests and nerve function tests.  Treatment involves treating the underlying cause of the neuropathy and taking medicines to help control pain. Physical therapy and assistive devices may also help. This information is not intended to replace advice given to you by your health care provider. Make sure you discuss any questions you have with your health care provider. Document Revised: 09/25/2020 Document Reviewed: 09/25/2020 Elsevier Patient Education  2021 [employer].

## 2021-04-17 LAB — CMP14+EGFR
ALT: 71 IU/L — ABNORMAL HIGH (ref 0–32)
AST: 61 IU/L — ABNORMAL HIGH (ref 0–40)
Albumin/Globulin Ratio: 1.6 (ref 1.2–2.2)
Albumin: 4.6 g/dL (ref 3.8–4.8)
Alkaline Phosphatase: 155 IU/L — ABNORMAL HIGH (ref 44–121)
BUN/Creatinine Ratio: 20 (ref 9–23)
BUN: 14 mg/dL (ref 6–20)
Bilirubin Total: 0.5 mg/dL (ref 0.0–1.2)
CO2: 26 mmol/L (ref 20–29)
Calcium: 9.9 mg/dL (ref 8.7–10.2)
Chloride: 97 mmol/L (ref 96–106)
Creatinine, Ser: 0.7 mg/dL (ref 0.57–1.00)
Globulin, Total: 2.9 g/dL (ref 1.5–4.5)
Glucose: 154 mg/dL — ABNORMAL HIGH (ref 65–99)
Potassium: 4.9 mmol/L (ref 3.5–5.2)
Sodium: 141 mmol/L (ref 134–144)
Total Protein: 7.5 g/dL (ref 6.0–8.5)
eGFR: 116 mL/min/{1.73_m2} (ref >59)

## 2021-04-17 LAB — MICROALBUMIN / CREATININE URINE RATIO
Creatinine, Urine: 56.9 mg/dL
Microalb/Creat Ratio: 89 mg/g creat — ABNORMAL HIGH (ref 0–29)
Microalbumin, Urine: 50.9 ug/mL

## 2021-04-18 ENCOUNTER — Other Ambulatory Visit: Payer: Self-pay | Admitting: Family

## 2021-04-18 ENCOUNTER — Other Ambulatory Visit: Payer: Self-pay | Admitting: Family Medicine

## 2021-04-18 MED ORDER — EMPAGLIFLOZIN 10 MG PO TABS: 10.0000 mg | ORAL_TABLET | Freq: Every day | ORAL | 1 refills | Status: DC

## 2021-04-18 NOTE — Progress Notes (Signed)
Pt calling back about labs

## 2021-04-19 ENCOUNTER — Telehealth: Payer: Self-pay | Admitting: Family Medicine

## 2021-04-19 NOTE — Telephone Encounter (Signed)
Pa sent and done for Jardiance 38m  Through cover my meds. This request has received a Favorable outcome.  Please note any additional information provided by [facility].

## 2021-04-22 NOTE — Telephone Encounter (Signed)
Approved on April 22 [place] Case: [id number], Status: Approved, Coverage Starts on: 04/19/2021 12:00:00 AM, Coverage Ends on: 04/19/2022 12:00:00 AM  WM mayodan called and aware

## 2021-04-23 ENCOUNTER — Other Ambulatory Visit: Payer: Self-pay | Admitting: Family

## 2021-04-23 MED ORDER — EXENATIDE ER 2 MG ~~LOC~~ PEN: 2.0000 mg | PEN_INJECTOR | SUBCUTANEOUS | 1 refills | Status: DC

## 2021-05-03 IMAGING — US US BIOPSY CORE LIVER
1 series · 12 of 12 positions shown · non-contrast
Comparison: none

INDICATION: 33-year-old with abnormal liver function tests and positive for
antimitochondrial antibody.

[Series 1: us biopsy (liver) · 12 of 12 slices shown]
[im 1/12]
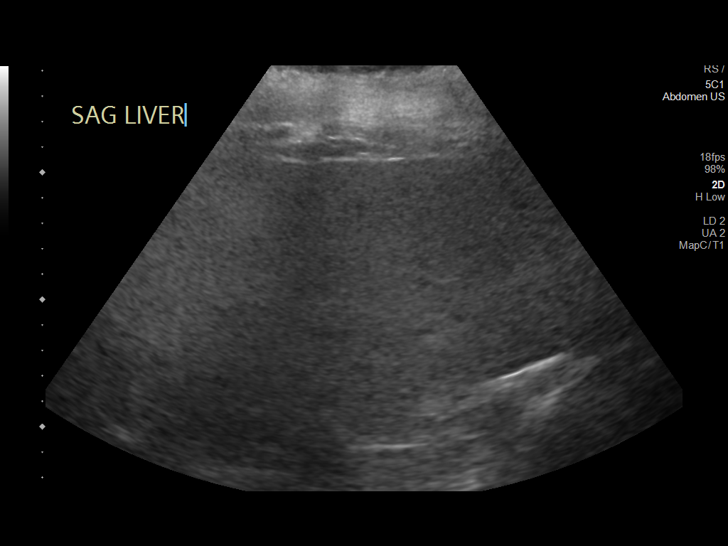
[im 2/12]
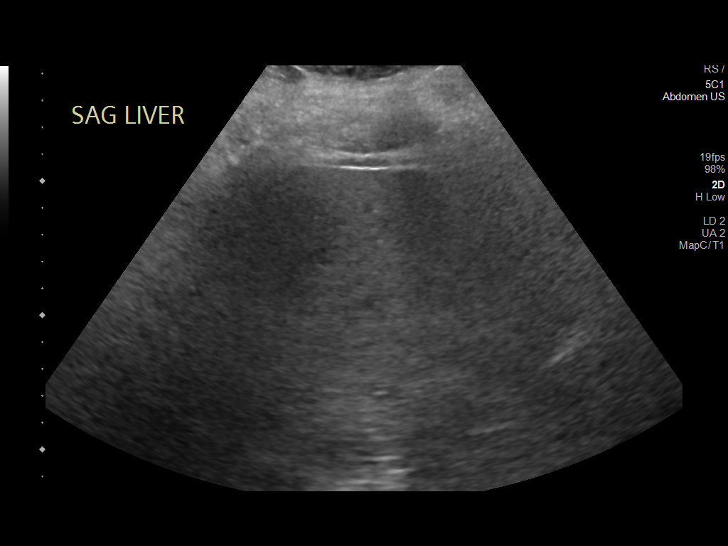
[im 3/12]
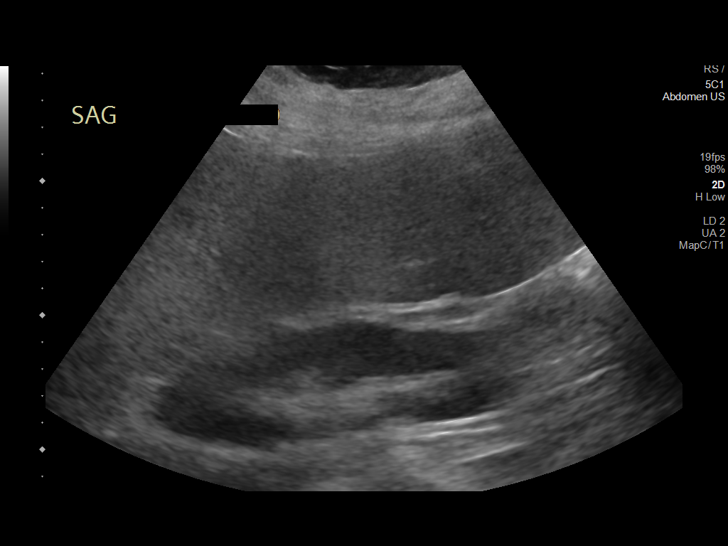
[im 4/12]
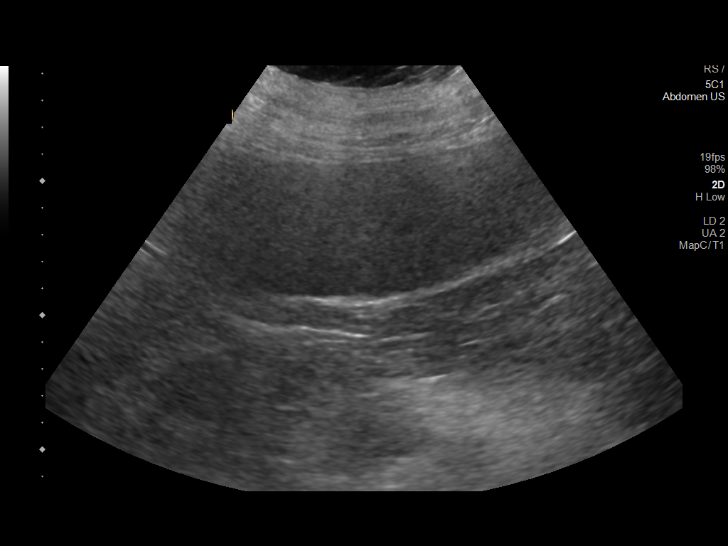
[im 5/12]
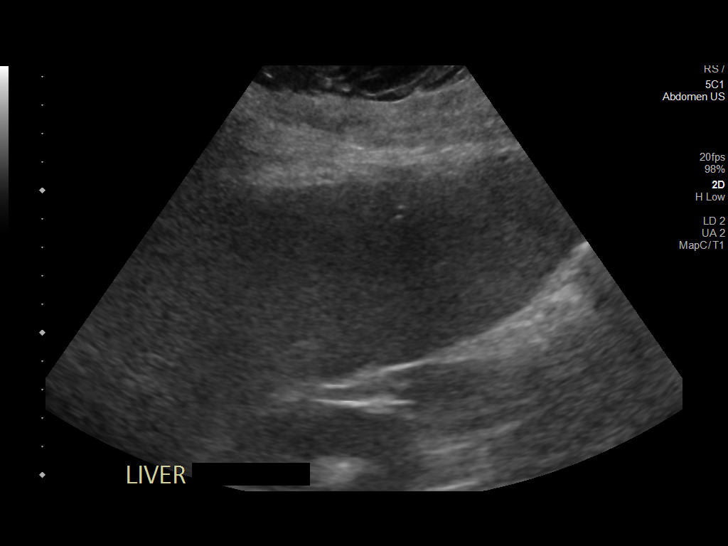
[im 6/12]
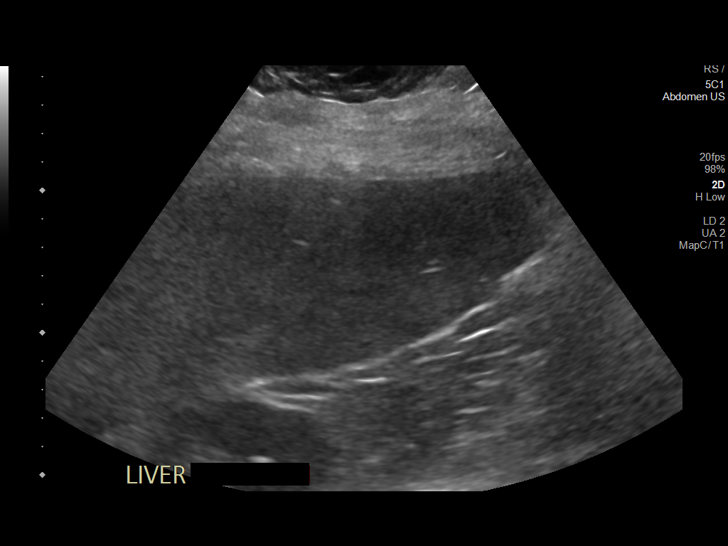
[im 7/12]
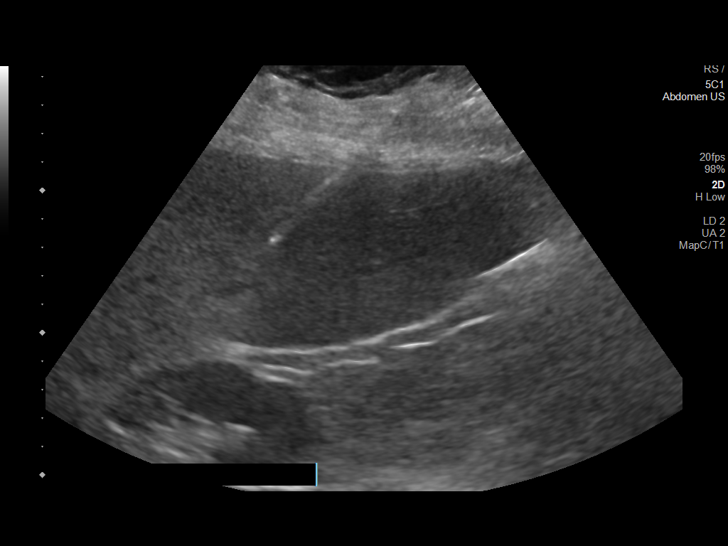
[im 8/12]
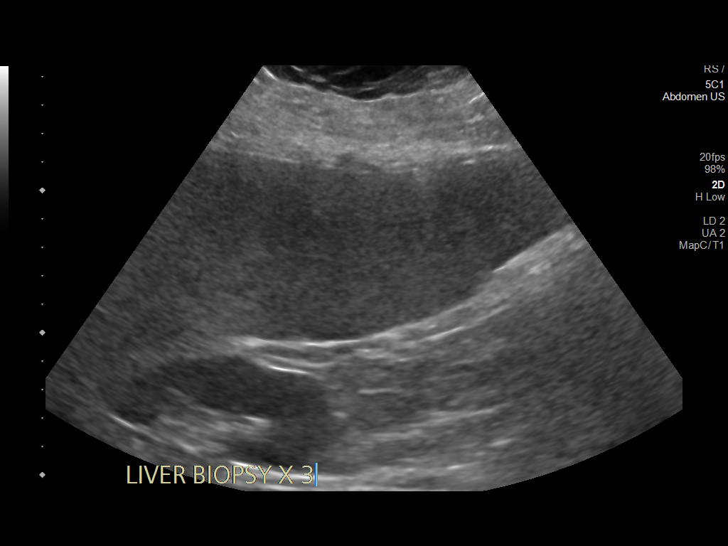
[im 9/12]
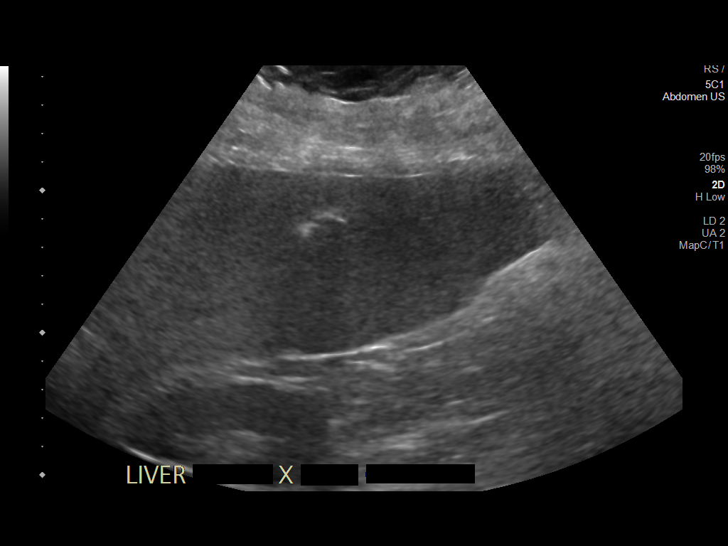
[im 10/12]
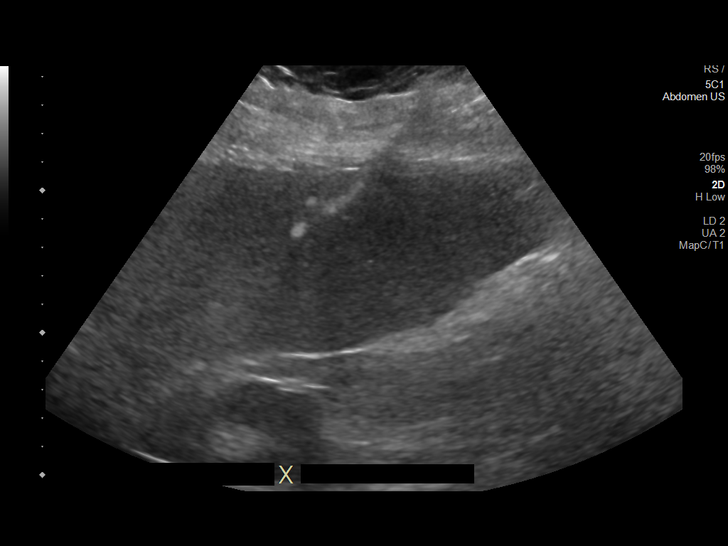
[im 11/12]
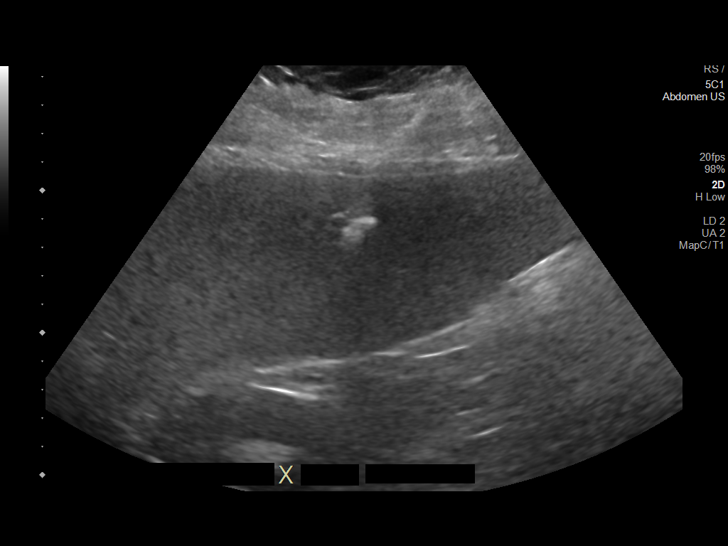
[im 12/12]
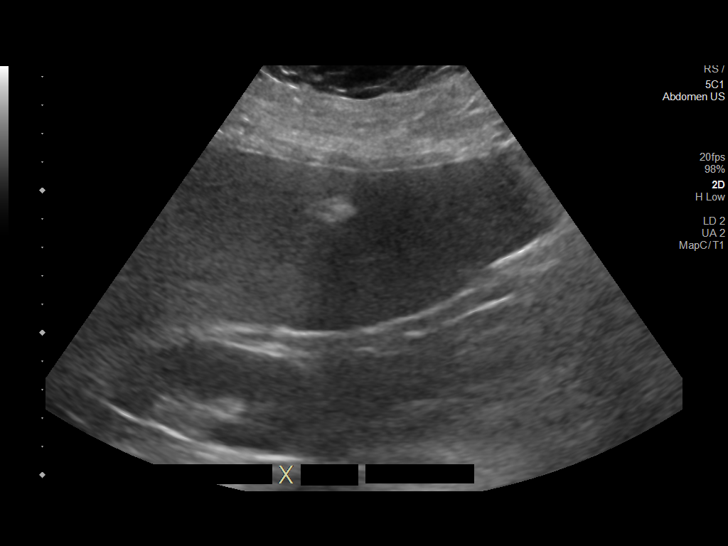

[12 of 12 positions shown; findings below may reference images not displayed]

EXAM:
ULTRASOUND-GUIDED RANDOM LIVER BIOPSY

MEDICATIONS:
None.

ANESTHESIA/SEDATION:
Moderate (conscious) sedation was employed during this procedure. A
total of Versed 1.5 mg and Fentanyl 50 mcg was administered
intravenously.

Moderate Sedation Time: 18 minutes minutes. The patient's level of
consciousness and vital signs were monitored continuously by
radiology nursing throughout the procedure under my direct
supervision.

FLUOROSCOPY TIME:  None

COMPLICATIONS:
None immediate.

PROCEDURE:
Informed written consent was obtained from the patient after a
thorough discussion of the procedural risks, benefits and
alternatives. All questions were addressed. Maximal Sterile Barrier
Technique was utilized including caps, mask, sterile gowns, sterile
gloves, sterile drape, hand hygiene and skin antiseptic. A timeout
was performed prior to the initiation of the procedure.

Liver was evaluated with ultrasound. The right hepatic lobe was
targeted for biopsy. Right side of the abdomen was prepped with
chlorhexidine and sterile field was created. Skin was anesthetized
with 1% lidocaine. Small incision was made. Using ultrasound
guidance, 17 gauge needle was directed into the right hepatic lobe.
Total of 3 core biopsies were obtained with an 18 gauge core device.
Gel-Foam slurry was injected through the 17 gauge needle as it was
removed. Bandage placed over the puncture site.
FINDINGS: Needle position confirmed within the liver. Three adequate core
biopsies were obtained. Specimens placed in formalin. No significant
bleeding or hematoma formation following the core biopsies.
IMPRESSION: Ultrasound-guided core biopsy of the right hepatic lobe.

## 2021-05-07 ENCOUNTER — Ambulatory Visit (HOSPITAL_COMMUNITY): Payer: Medicaid Other | Admitting: Hematology

## 2021-05-07 ENCOUNTER — Other Ambulatory Visit (HOSPITAL_COMMUNITY): Payer: Medicaid Other

## 2021-05-07 ENCOUNTER — Encounter: Payer: Self-pay | Admitting: Family Medicine

## 2021-05-12 ENCOUNTER — Other Ambulatory Visit: Payer: Self-pay | Admitting: Family

## 2021-05-12 DIAGNOSIS — M5442 Lumbago with sciatica, left side: Secondary | ICD-10-CM

## 2021-05-12 DIAGNOSIS — G8929 Other chronic pain: Secondary | ICD-10-CM

## 2021-06-20 ENCOUNTER — Ambulatory Visit: Payer: Medicaid Other | Admitting: Family

## 2021-06-23 ENCOUNTER — Other Ambulatory Visit: Payer: Self-pay | Admitting: Family

## 2021-06-23 DIAGNOSIS — M545 Low back pain, unspecified: Secondary | ICD-10-CM

## 2021-06-24 ENCOUNTER — Other Ambulatory Visit: Payer: Self-pay | Admitting: Adult Health

## 2021-06-24 MED ORDER — FLUCONAZOLE 150 MG PO TABS: ORAL_TABLET | ORAL | 1 refills | Status: DC

## 2021-06-24 NOTE — Progress Notes (Signed)
rx diflucan

## 2021-07-05 ENCOUNTER — Telehealth: Payer: Self-pay | Admitting: Family Medicine

## 2021-07-05 ENCOUNTER — Other Ambulatory Visit: Payer: Self-pay

## 2021-07-05 ENCOUNTER — Ambulatory Visit: Payer: Medicaid Other | Admitting: Family

## 2021-07-05 ENCOUNTER — Encounter: Payer: Self-pay | Admitting: Family

## 2021-07-05 VITALS — BP 155/97 | HR 114 | Temp 96.0°F | Ht 60.0 in | Wt 326.6 lb

## 2021-07-05 DIAGNOSIS — M5440 Lumbago with sciatica, unspecified side: Secondary | ICD-10-CM | POA: Diagnosis not present

## 2021-07-05 DIAGNOSIS — K7581 Nonalcoholic steatohepatitis (NASH): Secondary | ICD-10-CM

## 2021-07-05 DIAGNOSIS — M5136 Other intervertebral disc degeneration, lumbar region: Secondary | ICD-10-CM | POA: Diagnosis not present

## 2021-07-05 DIAGNOSIS — G8929 Other chronic pain: Secondary | ICD-10-CM

## 2021-07-05 DIAGNOSIS — Z6841 Body Mass Index (BMI) 40.0 and over, adult: Secondary | ICD-10-CM

## 2021-07-05 DIAGNOSIS — F17200 Nicotine dependence, unspecified, uncomplicated: Secondary | ICD-10-CM | POA: Diagnosis not present

## 2021-07-05 MED ORDER — OXYCODONE HCL 5 MG PO TABS: 5.0000 mg | ORAL_TABLET | Freq: Two times a day (BID) | ORAL | 0 refills | Status: DC | PRN

## 2021-07-05 MED ORDER — PREDNISONE 10 MG (21) PO TBPK: ORAL_TABLET | ORAL | 0 refills | Status: DC

## 2021-07-05 MED ORDER — KETOROLAC TROMETHAMINE 60 MG/2ML IM SOLN
60.0000 mg | Freq: Once | INTRAMUSCULAR | Status: AC
Start: 2021-07-05 — End: 2021-07-05
  Administered 2021-07-05: 60 mg via INTRAMUSCULAR

## 2021-07-05 NOTE — Progress Notes (Signed)
Subjective:    Patient ID: Heidi Landry, female    DOB: 05-10-1986, 35 y.o.   MRN: [id number]  Chief Complaint  Patient presents with   Back Pain    Hx DDD MRI bone spurs. Pain goes around to her hips into her abdomen or straight up the side pain runs down legs    PT has chronic back pain, however, over the last two days her pain has changed. She reports she believes she "did something to it". She has been laying on a heating pad for two days straight.   She has seen Ortho in the past, but has not seen them in the last year. She has had a MRI in past.   She is morbid obese, but has lost 28 lbs since 04/16/21. She is diabetic and does not check at home, but states she has greatly improved her diet.   She can not take tylenol at home because she has NASH.  Back Pain The current episode started in the past 7 days. The problem has been gradually worsening since onset. The pain is present in the lumbar spine. The quality of the pain is described as aching. Radiates to: left hip. The pain is at a severity of 7/10. The pain is moderate. The symptoms are aggravated by standing and bending. Associated symptoms include leg pain. Risk factors include obesity. She has tried bed rest, heat, ice and NSAIDs for the symptoms. The treatment provided mild relief.  Diabetes She has type 2 diabetes mellitus. Associated symptoms include foot paresthesias. Pertinent negatives for diabetes include no blurred vision. Symptoms are stable. Risk factors for coronary artery disease include diabetes mellitus, dyslipidemia, hypertension and sedentary lifestyle. (Not checking at home)     Review of Systems  Eyes:  Negative for blurred vision.  Musculoskeletal:  Positive for back pain.  All other systems reviewed and are negative.     Objective:   Physical Exam Vitals reviewed.  Constitutional:      General: She is not in acute distress.    Appearance: She is well-developed. She is obese.  HENT:     Head:  Normocephalic and atraumatic.  Eyes:     Pupils: Pupils are equal, round, and reactive to light.  Neck:     Thyroid: No thyromegaly.  Cardiovascular:     Rate and Rhythm: Normal rate and regular rhythm.     Heart sounds: Normal heart sounds. No murmur heard. Pulmonary:     Effort: Pulmonary effort is normal. No respiratory distress.     Breath sounds: Normal breath sounds. No wheezing.  Abdominal:     General: Bowel sounds are normal. There is no distension.     Palpations: Abdomen is soft.     Tenderness: There is no abdominal tenderness.  Musculoskeletal:        General: No tenderness.     Cervical back: Normal range of motion and neck supple.     Comments:  Pain in lumbar with flexion and extension  Skin:    General: Skin is warm and dry.  Neurological:     Mental Status: She is alert and oriented to person, place, and time.     Cranial Nerves: No cranial nerve deficit.     Deep Tendon Reflexes: Reflexes are normal and symmetric.  Psychiatric:        Behavior: Behavior normal.        Thought Content: Thought content normal.        Judgment: Judgment normal.  BP (!) 155/97   Pulse (!) 114   Temp (!) 96 F (35.6 C) (Temporal)   Ht 5' (1.524 m)   Wt (!) 326 lb 9.6 oz (148.1 kg)   BMI 63.78 kg/m       Assessment & Plan:  Heidi Landry comes in today with chief complaint of Back Pain (Hx DDD MRI bone spurs. Pain goes around to her hips into her abdomen or straight up the side pain runs down legs )   Diagnosis and orders addressed:  1. Degeneration of lumbar intervertebral disc Pt states she can not tolerate Ultram and given liver we will hold off on Norco. One month of oxcodone given.  Encouraged to follow up with Ortho Encourage weight loss Strict low carb diet and start monitoring BS at home  - oxyCODONE (ROXICODONE) 5 MG immediate release tablet; Take 1 tablet (5 mg total) by mouth every 12 (twelve) hours as needed for severe pain.  Dispense: 60 tablet;  Refill: 0 - predniSONE (STERAPRED UNI-PAK 21 TAB) 10 MG (21) TBPK tablet; Use as directed  Dispense: 21 tablet; Refill: 0 - ketorolac (TORADOL) injection 60 mg  2. Morbid obesity with BMI of 60.0-69.9, adult ([place])  3. Smoker  4. Chronic left-sided low back pain with sciatica, sciatica laterality unspecified - oxyCODONE (ROXICODONE) 5 MG immediate release tablet; Take 1 tablet (5 mg total) by mouth every 12 (twelve) hours as needed for severe pain.  Dispense: 60 tablet; Refill: 0 - predniSONE (STERAPRED UNI-PAK 21 TAB) 10 MG (21) TBPK tablet; Use as directed  Dispense: 21 tablet; Refill: 0 - ketorolac (TORADOL) injection 60 mg  5. NASH (nonalcoholic steatohepatitis) Avoid tylenol   Patient reviewed in [place] controlled database, no flags noted. Health Maintenance reviewed Diet and exercise encouraged  Follow up plan: 1 months   Evelina Dun, FNP

## 2021-07-05 NOTE — Telephone Encounter (Signed)
Your information has been sent to [facility].

## 2021-07-05 NOTE — Patient Instructions (Signed)
https://[email or web address]">  Chronic Back Pain When back pain lasts longer than 3 months, it is called chronic back pain. The cause of your back pain may not be known. Some common causes include: Wear and tear (degenerative disease) of the bones, ligaments, or disks in your back. Inflammation and stiffness in your back (arthritis). People who have chronic back pain often go through certain periods in which the pain is more intense (flare-ups). Many people can learn to manage the pain with home care. Follow these instructions at home: Pay attention to any changes in your symptoms. Take these actions to help withyour pain: Managing pain and stiffness     If directed, apply ice to the painful area. Your health care provider may recommend applying ice during the first 24-48 hours after a flare-up begins. To do this: Put ice in a plastic bag. Place a towel between your skin and the bag. Leave the ice on for 20 minutes, 2-3 times per day. If directed, apply heat to the affected area as often as told by your health care provider. Use the heat source that your health care provider recommends, such as a moist heat pack or a heating pad. Place a towel between your skin and the heat source. Leave the heat on for 20-30 minutes. Remove the heat if your skin turns bright red. This is especially important if you are unable to feel pain, heat, or cold. You may have a greater risk of getting burned. Try soaking in a warm tub. Activity  Avoid bending and other activities that make the problem worse. Maintain a proper position when standing or sitting: When standing, keep your upper back and neck straight, with your shoulders pulled back. Avoid slouching. When sitting, keep your back straight and relax your shoulders. Do not round your shoulders or pull them backward. Do not sit or stand in one place for long periods of time. Take brief periods of rest throughout the day. This will reduce your  pain. Resting in a lying or standing position is usually better than sitting to rest. When you are resting for longer periods, mix in some mild activity or stretching between periods of rest. This will help to prevent stiffness and pain. Get regular exercise. Ask your health care provider what activities are safe for you. Do not lift anything that is heavier than 10 lb (4.5 kg), or the limit that you are told, until your health care provider says that it is safe. Always use proper lifting technique, which includes: Bending your knees. Keeping the load close to your body. Avoiding twisting. Sleep on a firm mattress in a comfortable position. Try lying on your side with your knees slightly bent. If you lie on your back, put a pillow under your knees.  Medicines Treatment may include medicines for pain and inflammation taken by mouth or applied to the skin, prescription pain medicine, or muscle relaxants. Take over-the-counter and prescription medicines only as told by your health care provider. Ask your health care provider if the medicine prescribed to you: Requires you to avoid driving or using machinery. Can cause constipation. You may need to take these actions to prevent or treat constipation: Drink enough fluid to keep your urine pale yellow. Take over-the-counter or prescription medicines. Eat foods that are high in fiber, such as beans, whole grains, and fresh fruits and vegetables. Limit foods that are high in fat and processed sugars, such as fried or sweet foods. General instructions Do not use any products that contain   nicotine or tobacco, such as cigarettes, e-cigarettes, and chewing tobacco. If you need help quitting, ask your health care provider. Keep all follow-up visits as told by your health care provider. This is important. Contact a health care provider if: You have pain that is not relieved with rest or medicine. Your pain gets worse, or you have new pain. You have a high  fever. You have rapid weight loss. You have trouble doing your normal activities. Get help right away if: You have weakness or numbness in one or both of your legs or feet. You have trouble controlling your bladder or your bowels. You have severe back pain and have any of the following: Nausea or vomiting. Pain in your abdomen. Shortness of breath or you faint. Summary Chronic back pain is back pain that lasts longer than 3 months. When a flare-up begins, apply ice to the painful area for the first 24-48 hours. Apply a moist heat pad or use a heating pad on the painful area as directed by your health care provider. When you are resting for longer periods, mix in some mild activity or stretching between periods of rest. This will help to prevent stiffness and pain. This information is not intended to replace advice given to you by your health care provider. Make sure you discuss any questions you have with your healthcare provider. Document Revised: 01/25/2020 Document Reviewed: 01/25/2020 Elsevier Patient Education  2022 [employer] Inc.  

## 2021-07-06 ENCOUNTER — Other Ambulatory Visit: Payer: Self-pay | Admitting: Family

## 2021-07-06 DIAGNOSIS — E1169 Type 2 diabetes mellitus with other specified complication: Secondary | ICD-10-CM

## 2021-07-06 DIAGNOSIS — M545 Low back pain, unspecified: Secondary | ICD-10-CM

## 2021-07-06 DIAGNOSIS — E119 Type 2 diabetes mellitus without complications: Secondary | ICD-10-CM

## 2021-07-06 DIAGNOSIS — E781 Pure hyperglyceridemia: Secondary | ICD-10-CM

## 2021-07-08 NOTE — Telephone Encounter (Signed)
PA resubmitted with clinical notes  Key: [id number] - PA Case ID: [id number] Need help? Call us at [phone] StatusSent to [facility] oxyCODONE HCl 5MG tablets

## 2021-07-08 NOTE — Telephone Encounter (Signed)
Approved today PA Case: [id number], Status: Approved, Coverage Starts on: 07/08/2021 12:00:00 AM, Coverage Ends on: 01/04/2022 12:00:00 AM.   Pharmacy and patient aware

## 2021-07-10 ENCOUNTER — Other Ambulatory Visit: Payer: Self-pay

## 2021-07-10 DIAGNOSIS — F331 Major depressive disorder, recurrent, moderate: Secondary | ICD-10-CM | POA: Diagnosis not present

## 2021-07-10 DIAGNOSIS — G8929 Other chronic pain: Secondary | ICD-10-CM

## 2021-07-10 DIAGNOSIS — M5136 Other intervertebral disc degeneration, lumbar region: Secondary | ICD-10-CM

## 2021-07-10 DIAGNOSIS — G4700 Insomnia, unspecified: Secondary | ICD-10-CM | POA: Diagnosis not present

## 2021-07-10 DIAGNOSIS — R454 Irritability and anger: Secondary | ICD-10-CM | POA: Diagnosis not present

## 2021-07-10 NOTE — Telephone Encounter (Signed)
Please refuse-will not let clinical refuse since its controlled.

## 2021-07-16 ENCOUNTER — Other Ambulatory Visit: Payer: Self-pay

## 2021-07-16 ENCOUNTER — Ambulatory Visit: Payer: Medicaid Other | Admitting: Podiatry

## 2021-07-16 ENCOUNTER — Encounter: Payer: Self-pay | Admitting: Podiatry

## 2021-07-16 DIAGNOSIS — E119 Type 2 diabetes mellitus without complications: Secondary | ICD-10-CM

## 2021-07-16 DIAGNOSIS — E1149 Type 2 diabetes mellitus with other diabetic neurological complication: Secondary | ICD-10-CM | POA: Diagnosis not present

## 2021-07-19 NOTE — Progress Notes (Signed)
Subjective:   Patient ID: Heidi Landry, female   DOB: 35 y.o.   MRN: [id number]   HPI 35 year old female presents the office today for concerns of tingling to her toes as well as withdrawal, peeling, cracking skin.  Feels a cracking skin she does get some discomfort.  She denies recent injury or trauma.  No open sores.  No swelling or redness or drainage.   Review of Systems  All other systems reviewed and are negative.  Past Medical History:  Diagnosis Date   Adrenal adenoma    right   Anxiety    Atrial fibrillation (HCC)    Carpal tunnel syndrome    Carpal tunnel syndrome    Carpal tunnel syndrome, bilateral    Chest pain    Chronic back pain    Degenerative disc disease    Edema    Gestational diabetes mellitus, antepartum    Gestational diabetes mellitus, class A2/B 08/08/2015   Early 2hr: 84/186/117 @ 12wks   A1/B  Notified & dietician referral ordered 08/08/15    Hypertension    IBS (irritable bowel syndrome)    Panic attacks    Placental abruption in third trimester 12/07/2015   Post partum depression    Sleep apnea    Spondylosis    Tendonitis    Thoracic spine fracture ([place]) 04/13/2012   Varicose veins of left lower extremity with complications 76/28/3151    Past Surgical History:  Procedure Laterality Date   CESAREAN SECTION N/A 12/08/2015   Procedure: CESAREAN SECTION;  Surgeon: Jonnie Kind, MD;  Location: [facility];  Service: Obstetrics;  Laterality: N/A;   CHOLECYSTECTOMY     KNEE SURGERY Right 01/29/2019   at [facility] in [place], [place]; s/p fall and deel leg laceration     Current Outpatient Medications:    albuterol (VENTOLIN HFA) 108 (90 Base) MCG/ACT inhaler, Inhale 2 puffs into the lungs every 6 (six) hours as needed for wheezing or shortness of breath., Disp: 8 g, Rfl: 0   atorvastatin (LIPITOR) 20 MG tablet, Take 1 tablet (20 mg total) by mouth daily., Disp: 90 tablet, Rfl: 2   buPROPion (WELLBUTRIN SR) 150 MG 12 hr tablet,  Take 2 tablets (300 mg total) by mouth daily., Disp: 180 tablet, Rfl: 2   buPROPion (WELLBUTRIN XL) 300 MG 24 hr tablet, Take 300 mg by mouth every morning., Disp: , Rfl:    diclofenac (VOLTAREN) 75 MG EC tablet, Take 1 tablet (75 mg total) by mouth 2 (two) times daily., Disp: 60 tablet, Rfl: 3   DULoxetine (CYMBALTA) 60 MG capsule, Take 1 capsule (60 mg total) by mouth daily., Disp: 90 capsule, Rfl: 3   empagliflozin (JARDIANCE) 10 MG TABS tablet, Take 1 tablet (10 mg total) by mouth daily., Disp: 90 tablet, Rfl: 1   Exenatide ER 2 MG PEN, Inject 2 mg into the skin once a week., Disp: 12 each, Rfl: 1   fexofenadine (ALLEGRA) 180 MG tablet, Take 1 tablet by mouth once daily, Disp: 90 tablet, Rfl: 3   fluconazole (DIFLUCAN) 150 MG tablet, Take 1 now and 1 in 3 days, Disp: 2 tablet, Rfl: 1   fluticasone (FLONASE) 50 MCG/ACT nasal spray, Place 2 sprays into both nostrils daily., Disp: 16 g, Rfl: 6   gabapentin (NEURONTIN) 100 MG capsule, TAKE 1 CAPSULE BY MOUTH THREE TIMES DAILY, Disp: 90 capsule, Rfl: 1   hydrochlorothiazide (HYDRODIURIL) 12.5 MG tablet, Take 1 tablet (12.5 mg total) by mouth daily., Disp: 90  tablet, Rfl: 2   metFORMIN (GLUCOPHAGE-XR) 500 MG 24 hr tablet, Take 2 tablets (1,000 mg total) by mouth daily. (NEEDS TO BE SEEN BEFORE NEXT REFILL), Disp: 60 tablet, Rfl: 0   metoprolol succinate (TOPROL-XL) 25 MG 24 hr tablet, TAKE 1 TABLET BY MOUTH ONCE DAILY WITH MEALS OR  IMMEDIATELY  FOLLOWING, Disp: 90 tablet, Rfl: 1   Multiple Vitamin (MULTIVITAMIN WITH MINERALS) TABS tablet, Take 1 tablet by mouth daily. womens, Disp: , Rfl:    norethindrone (MICRONOR) 0.35 MG tablet, Take 1 tablet by mouth once daily, Disp: 28 tablet, Rfl: 12   nystatin (MYCOSTATIN/NYSTOP) powder, Apply 1 application topically 2 (two) times daily., Disp: 60 g, Rfl: 3   omeprazole (PRILOSEC) 40 MG capsule, Take 1 capsule (40 mg total) by mouth daily., Disp: 90 capsule, Rfl: 4   oxyCODONE (ROXICODONE) 5 MG immediate  release tablet, Take 1 tablet (5 mg total) by mouth every 12 (twelve) hours as needed for severe pain., Disp: 60 tablet, Rfl: 0   podofilox (CONDYLOX) 0.5 % gel, Apply topically 2 (two) times daily., Disp: 3.5 g, Rfl: 11   predniSONE (STERAPRED UNI-PAK 21 TAB) 10 MG (21) TBPK tablet, Use as directed, Disp: 21 tablet, Rfl: 0   Probiotic Product (PROBIOTIC DAILY PO), Take 1 capsule by mouth daily. Women's care, Disp: , Rfl:    silver sulfADIAZINE (SILVADENE) 1 % cream, Apply 1 application topically 2 (two) times daily., Disp: 50 g, Rfl: 0   spironolactone (ALDACTONE) 100 MG tablet, Take 1 tablet (100 mg total) by mouth daily., Disp: 90 tablet, Rfl: 1   SV MELATONIN 5 MG TABS, Take 5 mg by mouth at bedtime., Disp: , Rfl:    ursodiol (ACTIGALL) 500 MG tablet, TAKE 2 TABLETS BY MOUTH TWICE DAILY WITH A MEAL, Disp: 120 tablet, Rfl: 5   venlafaxine XR (EFFEXOR-XR) 75 MG 24 hr capsule, Take 75 mg by mouth daily., Disp: , Rfl:   No Known Allergies       Objective:  Physical Exam  General: AAO x3, NAD  Dermatological: There is dry, peeling, cracking skin present with callus formation.  Calluses are mostly along the medial hallux, MPJ.  There is no ulcerations or open lesions identified.  No edema, erythema.  Vascular: Dorsalis Pedis artery and Posterior Tibial artery pedal pulses are 2/4 bilateral with immedate capillary fill time.  There is no pain with calf compression, swelling, warmth, erythema.   Neruologic: Sensation decreased to the digits.  Musculoskeletal: No pain, crepitus, or limitation noted with foot and ankle range of motion bilateral. Muscular strength 5/5 in all groups tested bilateral.  Gait: Unassisted, Nonantalgic.       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  There is a  mild decrease sensation.  Discussed neuropathy given increased blood sugar.  Last A1c was 8.4.  Discussed glucose control.  She is already on Cymbalta as well as gabapentin.  Need to monitor symptoms of this and if worsening will refer to neurology. -As a courtesy debrided the calluses without any complications or bleeding.  Recommend moisturizer daily. -Daily foot inspection.  Trula Slade DPM

## 2021-07-22 ENCOUNTER — Encounter: Payer: Self-pay | Admitting: Family

## 2021-07-22 ENCOUNTER — Other Ambulatory Visit: Payer: Self-pay

## 2021-07-22 ENCOUNTER — Ambulatory Visit: Payer: Medicaid Other | Admitting: Family

## 2021-07-22 ENCOUNTER — Telehealth: Payer: Self-pay | Admitting: Family

## 2021-07-22 VITALS — BP 136/85 | HR 99 | Temp 97.9°F | Ht 60.0 in | Wt 326.0 lb

## 2021-07-22 DIAGNOSIS — G8929 Other chronic pain: Secondary | ICD-10-CM

## 2021-07-22 DIAGNOSIS — G6289 Other specified polyneuropathies: Secondary | ICD-10-CM

## 2021-07-22 DIAGNOSIS — K219 Gastro-esophageal reflux disease without esophagitis: Secondary | ICD-10-CM

## 2021-07-22 DIAGNOSIS — G4733 Obstructive sleep apnea (adult) (pediatric): Secondary | ICD-10-CM

## 2021-07-22 DIAGNOSIS — D72829 Elevated white blood cell count, unspecified: Secondary | ICD-10-CM | POA: Diagnosis not present

## 2021-07-22 DIAGNOSIS — E1169 Type 2 diabetes mellitus with other specified complication: Secondary | ICD-10-CM

## 2021-07-22 DIAGNOSIS — F339 Major depressive disorder, recurrent, unspecified: Secondary | ICD-10-CM | POA: Diagnosis not present

## 2021-07-22 DIAGNOSIS — I152 Hypertension secondary to endocrine disorders: Secondary | ICD-10-CM

## 2021-07-22 DIAGNOSIS — F411 Generalized anxiety disorder: Secondary | ICD-10-CM

## 2021-07-22 DIAGNOSIS — M5136 Other intervertebral disc degeneration, lumbar region: Secondary | ICD-10-CM

## 2021-07-22 DIAGNOSIS — M5440 Lumbago with sciatica, unspecified side: Secondary | ICD-10-CM

## 2021-07-22 DIAGNOSIS — E785 Hyperlipidemia, unspecified: Secondary | ICD-10-CM | POA: Diagnosis not present

## 2021-07-22 DIAGNOSIS — N3944 Nocturnal enuresis: Secondary | ICD-10-CM

## 2021-07-22 DIAGNOSIS — E781 Pure hyperglyceridemia: Secondary | ICD-10-CM

## 2021-07-22 DIAGNOSIS — G629 Polyneuropathy, unspecified: Secondary | ICD-10-CM | POA: Insufficient documentation

## 2021-07-22 DIAGNOSIS — K7581 Nonalcoholic steatohepatitis (NASH): Secondary | ICD-10-CM

## 2021-07-22 DIAGNOSIS — E119 Type 2 diabetes mellitus without complications: Secondary | ICD-10-CM | POA: Diagnosis not present

## 2021-07-22 DIAGNOSIS — E1159 Type 2 diabetes mellitus with other circulatory complications: Secondary | ICD-10-CM

## 2021-07-22 DIAGNOSIS — Z713 Dietary counseling and surveillance: Secondary | ICD-10-CM

## 2021-07-22 DIAGNOSIS — Z6841 Body Mass Index (BMI) 40.0 and over, adult: Secondary | ICD-10-CM

## 2021-07-22 LAB — MICROSCOPIC EXAMINATION: RBC, Urine: NONE SEEN /hpf (ref 0–2)

## 2021-07-22 LAB — URINALYSIS, COMPLETE
Bilirubin, UA: NEGATIVE
Ketones, UA: NEGATIVE
Leukocytes,UA: NEGATIVE
Nitrite, UA: NEGATIVE
RBC, UA: NEGATIVE
Specific Gravity, UA: >1.03 — ABNORMAL HIGH (ref 1.005–1.030)
Urobilinogen, Ur: 0.2 mg/dL (ref 0.2–1.0)
pH, UA: 5 (ref 5.0–7.5)

## 2021-07-22 LAB — BAYER DCA HB A1C WAIVED: HB A1C (BAYER DCA - WAIVED): 8.2 % — ABNORMAL HIGH (ref <7.0)

## 2021-07-22 MED ORDER — TIRZEPATIDE 2.5 MG/0.5ML ~~LOC~~ SOAJ: 2.5000 mg | SUBCUTANEOUS | 0 refills | Status: DC

## 2021-07-22 MED ORDER — TIRZEPATIDE 5 MG/0.5ML ~~LOC~~ SOAJ: 5.0000 mg | SUBCUTANEOUS | 0 refills | Status: DC

## 2021-07-22 MED ORDER — BLOOD GLUCOSE METER KIT: PACK | 0 refills | Status: AC

## 2021-07-22 NOTE — Telephone Encounter (Signed)
  Prescription Request  07/22/2021  What is the name of the medication or equipment? Oxycodone 5 mg #30. Insurance approved pain meds and needs to send another RX for #30  Have you contacted your pharmacy to request a refill? (if applicable) NO  Which pharmacy would you like this sent to? [facility]   Patient notified that their request is being sent to the clinical staff for review and that they should receive a response within 2 business days.

## 2021-07-22 NOTE — Progress Notes (Signed)
Subjective:    Patient ID: Heidi Landry, female    DOB: [birth year], 35 y.o.   MRN: [id number]  Chief Complaint  Patient presents with   Medical Management of Chronic Issues    Incontinence forms to fill out needs referral to urology.    Back Pain   PT presents to the office today for chronic follow up. She is followed by GI  Every 3 months for NASH. She is followed by [facility] every 4-6 weeks for GAD and Depression.   She has a bariatric surgeon appointment in a few months.   She had Leukocytosis and we did a referral to Hematologists. The was thought to be related to steroid and recent COVID.    She is also complaining of generalized nerve pain in feet, legs, buttocks, chest, and arms of a 2 out 10. She reports she has seen a neurologists in the past and has a follow up scheduled.   She is complaining of nocturnal enuresis. States she has this several years ago, but resolved after she started using her CPAP. However, over the last two months she started urinating every night.  Back Pain This is a chronic problem. The current episode started more than 1 year ago. The problem occurs intermittently. The problem has been waxing and waning since onset. The pain is present in the lumbar spine. The quality of the pain is described as aching. The pain is at a severity of 8/10. Associated symptoms include leg pain. Risk factors include obesity. She has tried analgesics and bed rest for the symptoms. The treatment provided mild relief.  Hypertension This is a chronic problem. The current episode started more than 1 year ago. The problem has been resolved since onset. The problem is controlled. Associated symptoms include malaise/fatigue. Pertinent negatives include no blurred vision, peripheral edema or shortness of breath. Risk factors for coronary artery disease include dyslipidemia, diabetes mellitus, obesity and sedentary lifestyle. The current treatment provides moderate improvement.   Diabetes She presents for her follow-up diabetic visit. She has type 2 diabetes mellitus. Hypoglycemia symptoms include nervousness/anxiousness. Associated symptoms include foot paresthesias. Pertinent negatives for diabetes include no blurred vision. There are no hypoglycemic complications. Diabetic complications include heart disease and peripheral neuropathy. Risk factors for coronary artery disease include dyslipidemia, diabetes mellitus, hypertension, sedentary lifestyle and post-menopausal. She is following a generally unhealthy diet. (Does not check BS at home) An ACE inhibitor/angiotensin II receptor blocker is being taken. Eye exam is not current.  Hyperlipidemia This is a chronic problem. The current episode started more than 1 year ago. Exacerbating diseases include obesity. Associated symptoms include leg pain. Pertinent negatives include no shortness of breath. Current antihyperlipidemic treatment includes statins. The current treatment provides moderate improvement of lipids. Risk factors for coronary artery disease include dyslipidemia, diabetes mellitus, hypertension, a sedentary lifestyle and post-menopausal.  Anxiety Presents for follow-up visit. Symptoms include depressed mood, excessive worry, irritability, nervous/anxious behavior and restlessness. Patient reports no shortness of breath. Symptoms occur occasionally. The severity of symptoms is moderate.    OSA   Review of Systems  Constitutional:  Positive for irritability and malaise/fatigue.  Eyes:  Negative for blurred vision.  Respiratory:  Negative for shortness of breath.   Musculoskeletal:  Positive for back pain.  Psychiatric/Behavioral:  The patient is nervous/anxious.   All other systems reviewed and are negative.     Objective:   Physical Exam Vitals reviewed.  Constitutional:      General: She is not in acute distress.  Appearance: She is well-developed. She is obese.  HENT:     Head: Normocephalic and  atraumatic.     Right Ear: Tympanic membrane normal.     Left Ear: Tympanic membrane normal.  Eyes:     Pupils: Pupils are equal, round, and reactive to light.  Neck:     Thyroid: No thyromegaly.  Cardiovascular:     Rate and Rhythm: Normal rate and regular rhythm.     Heart sounds: Normal heart sounds. No murmur heard. Pulmonary:     Effort: Pulmonary effort is normal. No respiratory distress.     Breath sounds: Normal breath sounds. No wheezing.  Abdominal:     General: Bowel sounds are normal. There is no distension.     Palpations: Abdomen is soft.     Tenderness: There is no abdominal tenderness.  Musculoskeletal:        General: Tenderness present. Normal range of motion.     Cervical back: Normal range of motion and neck supple.     Comments: Pain in lumbar with flexion and extension  Skin:    General: Skin is warm and dry.  Neurological:     Mental Status: She is alert and oriented to person, place, and time.     Cranial Nerves: No cranial nerve deficit.     Deep Tendon Reflexes: Reflexes are normal and symmetric.  Psychiatric:        Behavior: Behavior normal.        Thought Content: Thought content normal.        Judgment: Judgment normal.      BP 136/85   Pulse 99   Temp 97.9 F (36.6 C) (Temporal)   Ht 5' (1.524 m)   Wt (!) 326 lb (147.9 kg)   BMI 63.67 kg/m      Assessment & Plan:  Heidi Landry comes in today with chief complaint of Medical Management of Chronic Issues (Incontinence forms to fill out needs referral to urology. ) and Back Pain   Diagnosis and orders addressed:  1. Nocturnal enuresis - CMP14+EGFR  2. Hypertension associated with type 2 diabetes mellitus (HCC) - CMP14+EGFR  3. OSA (obstructive sleep apnea) - CMP14+EGFR  4. NASH (nonalcoholic steatohepatitis)  - CMP14+EGFR  5. GERD without esophagitis - CMP14+EGFR  6. Type 2 diabetes mellitus without complication, without long-term current use of insulin (HCC) - blood  glucose meter kit and supplies; Dispense based on patient and insurance preference. Use up to four times daily as directed. (FOR ICD-10 E10.9, E11.9).  Dispense: 1 each; Refill: 0 - Bayer DCA Hb A1c Waived - CMP14+EGFR  7. 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 Hyperlipidemia associated with type 2 diabetes mellitus (HCC) - CMP14+EGFR  9. Leukocytosis, unspecified type  - CMP14+EGFR  10. Depression, recurrent ([place]) - CMP14+EGFR  11. Morbid obesity with BMI of 60.0-69.9, adult (HCC) Start Mounjaro 2.5 mg today - tirzepatide (MOUNJARO) 2.5 MG/0.5ML Pen; Inject 2.5 mg into the skin once a week.  Dispense: 2 mL; Refill: 0 - tirzepatide (MOUNJARO) 5 MG/0.5ML Pen; Inject 5 mg into the skin once a week.  Dispense: 6 mL; Refill: 0 - CMP14+EGFR  12. Generalized anxiety disorder - CMP14+EGFR  13. Chronic left-sided low back pain with sciatica, sciatica laterality unspecified - CMP14+EGFR  14. Other polyneuropathy - CMP14+EGFR  15. Weight loss counseling, encounter for - tirzepatide [facility]) 2.5 MG/0.5ML Pen; Inject 2.5 mg into the skin once a week.  Dispense: 2 mL; Refill: 0 -  tirzepatide [facility]) 5 MG/0.5ML Pen; Inject 5 mg into the skin once a week.  Dispense: 6 mL; Refill: 0 - CMP14+EGFR   Labs pending Health Maintenance reviewed Diet and exercise encouraged  Follow up plan: 2 months to recheck weight loss and pain   Evelina Dun, FNP

## 2021-07-22 NOTE — Addendum Note (Signed)
Addended by: Evelina Dun A on: 07/22/2021 12:54 PM   Modules accepted: Orders

## 2021-07-22 NOTE — Patient Instructions (Signed)

## 2021-07-22 NOTE — Telephone Encounter (Signed)
Please advise 

## 2021-07-23 ENCOUNTER — Other Ambulatory Visit: Payer: Self-pay | Admitting: Family

## 2021-07-23 ENCOUNTER — Telehealth: Payer: Self-pay | Admitting: Family

## 2021-07-23 ENCOUNTER — Telehealth: Payer: Self-pay | Admitting: *Deleted

## 2021-07-23 DIAGNOSIS — M545 Low back pain, unspecified: Secondary | ICD-10-CM

## 2021-07-23 DIAGNOSIS — G6289 Other specified polyneuropathies: Secondary | ICD-10-CM

## 2021-07-23 DIAGNOSIS — M5440 Lumbago with sciatica, unspecified side: Secondary | ICD-10-CM

## 2021-07-23 DIAGNOSIS — G8929 Other chronic pain: Secondary | ICD-10-CM

## 2021-07-23 LAB — CMP14+EGFR
ALT: 46 IU/L — ABNORMAL HIGH (ref 0–32)
AST: 38 IU/L (ref 0–40)
Albumin/Globulin Ratio: 1.6 (ref 1.2–2.2)
Albumin: 4.6 g/dL (ref 3.8–4.8)
Alkaline Phosphatase: 143 IU/L — ABNORMAL HIGH (ref 44–121)
BUN/Creatinine Ratio: 13 (ref 9–23)
BUN: 12 mg/dL (ref 6–20)
Bilirubin Total: 0.6 mg/dL (ref 0.0–1.2)
CO2: 27 mmol/L (ref 20–29)
Calcium: 9.8 mg/dL (ref 8.7–10.2)
Chloride: 98 mmol/L (ref 96–106)
Creatinine, Ser: 0.9 mg/dL (ref 0.57–1.00)
Globulin, Total: 2.9 g/dL (ref 1.5–4.5)
Glucose: 131 mg/dL — ABNORMAL HIGH (ref 65–99)
Potassium: 4.7 mmol/L (ref 3.5–5.2)
Sodium: 139 mmol/L (ref 134–144)
Total Protein: 7.5 g/dL (ref 6.0–8.5)
eGFR: 85 mL/min/{1.73_m2} (ref >59)

## 2021-07-23 MED ORDER — OXYCODONE HCL 5 MG PO TABS: 5.0000 mg | ORAL_TABLET | Freq: Two times a day (BID) | ORAL | 0 refills | Status: DC | PRN

## 2021-07-23 MED ORDER — CYCLOBENZAPRINE HCL 10 MG PO TABS: 10.0000 mg | ORAL_TABLET | Freq: Three times a day (TID) | ORAL | 0 refills | Status: DC | PRN

## 2021-07-23 NOTE — Telephone Encounter (Signed)
Key: [id number]

## 2021-07-23 NOTE — Telephone Encounter (Signed)
Patient aware and verbalized understanding. °

## 2021-07-23 NOTE — Telephone Encounter (Signed)
FYI - PA needed after 07/26/21 for diclofenac sod 75 mg tabs  Pa started - may not go through yet due to coverage expiring 7/29.  Started and hit sent to plan - currently waiting on response questions from plan

## 2021-07-23 NOTE — Telephone Encounter (Signed)
Pt had an with [facility] yesterday  Pt needs a refill on flexeril 10 MG use [facility] Pt wants to know when will ppw for [facility] be faxed. Pt got an email stating it has not been received. Please fax [phone]

## 2021-07-23 NOTE — Telephone Encounter (Signed)
Prescription sent to pharmacy.

## 2021-07-24 LAB — URINE CULTURE

## 2021-07-24 MED ORDER — TRULICITY 1.5 MG/0.5ML ~~LOC~~ SOAJ: 1.5000 mg | SUBCUTANEOUS | 3 refills | Status: DC

## 2021-07-24 NOTE — Telephone Encounter (Signed)
Called patient to let her know to use copay card in the meantime (left VM)--medicaid will likely reject, but we can complete PA Did not see in cover my meds

## 2021-07-24 NOTE — Telephone Encounter (Signed)
Will switch to trulicity since covered on medicaid Patient aware D/c bydureon and start trulicity on Saturday F/u with pharmd in 1 month

## 2021-07-25 DIAGNOSIS — G4733 Obstructive sleep apnea (adult) (pediatric): Secondary | ICD-10-CM | POA: Diagnosis not present

## 2021-07-29 ENCOUNTER — Encounter: Payer: Self-pay | Admitting: Pulmonary Disease

## 2021-07-29 ENCOUNTER — Ambulatory Visit: Payer: Medicaid Other | Admitting: Pulmonary Disease

## 2021-07-29 ENCOUNTER — Other Ambulatory Visit: Payer: Self-pay

## 2021-07-29 VITALS — BP 140/80 | HR 113 | Temp 98.1°F | Ht 60.0 in | Wt 324.0 lb

## 2021-07-29 DIAGNOSIS — G4733 Obstructive sleep apnea (adult) (pediatric): Secondary | ICD-10-CM | POA: Diagnosis not present

## 2021-07-29 DIAGNOSIS — Z720 Tobacco use: Secondary | ICD-10-CM

## 2021-07-29 DIAGNOSIS — Z9989 Dependence on other enabling machines and devices: Secondary | ICD-10-CM

## 2021-07-29 DIAGNOSIS — E669 Obesity, unspecified: Secondary | ICD-10-CM

## 2021-07-29 DIAGNOSIS — G4730 Sleep apnea, unspecified: Secondary | ICD-10-CM | POA: Diagnosis not present

## 2021-07-29 NOTE — Progress Notes (Signed)
[facility]  Chief Complaint  Patient presents with   Follow-up    Patient states that before she got her CPAP machine that she used to pee on herself at night when she is sleeping but had no idea and it stopped when she got her cpap but the last 2 months she has started doing it again. States she is doing good with the cpap    Constitutional:  BP 140/80   Pulse (!) 113   Temp 98.1 F (36.7 C)   Ht 5' (1.524 m)   Wt (!) 324 lb (147 kg)   SpO2 93%   BMI 63.28 kg/m   Past Medical History:  DM type 2, HTN, Fatty liver, GERD, Adrenal adenoma, Anxiety, A fib, Carpal tunnel, Back pain, IBS, Depression  Past Surgical History:  She  has a past surgical history that includes Cholecystectomy; Cesarean section (N/A, 12/08/2015); and Knee surgery (Right, 01/29/2019).  Brief Summary:  Heidi Landry is a 35 y.o. female smoker with obstructive sleep apnea.        Subjective:   She has noticed episodes of nocturia again for past couple of months.  Not every night.  She doesn't feel like CPAP pressure is strong enough.  Her machine is more than 36 yrs old.  No issues with mask fit.  She has been working on her diet.  Still smokes 1 to 1.5 ppd.  She is afraid to use chantix due to side effects.  She has been getting a cough with clear sputum.  Uses albuterol couple times per week.  Physical Exam:   Appearance - well kempt   ENMT - no sinus tenderness, no oral exudate, no LAN, Mallampati 3 airway, no stridor  Respiratory - equal breath sounds bilaterally, no wheezing or rales  CV - s1s2 regular rate and rhythm, no murmurs  Ext - no clubbing, no edema  Skin - no rashes  Psych - normal mood and affect   Sleep Tests:  PSG 12/14/15 >> AHI 50.5, SpO2 low 74% Auto CPAP 05/07/20 to 06/05/20 >> used on 30 of 30 nights with average 8 hrs 19 min.  Average AHI 4.6 with median CPAP 13 and 95 th percentile CPAP 16 cm H2O.  Some air leak.  Cardiac  Tests:  Echo 03/27/16 >> EF 60 to 65%, mild LVH  Social History:  She  reports that she has been smoking cigarettes. She started smoking about 15 years ago. She has a 4.50 pack-year smoking history. She has never used smokeless tobacco. She reports previous alcohol use. She reports that she does not use drugs.  Family History:  Her family history includes Brain cancer in her paternal uncle; Cancer in her paternal uncle; Carpal tunnel syndrome in her mother; Cirrhosis in her maternal grandmother; Colon cancer (age of onset: 16) in her father; Diabetes in her maternal grandmother; Fibromyalgia in her mother; Heart attack in her maternal grandfather; Hernia in her father; Hyperlipidemia in her paternal grandmother; Hypertension in her father, maternal grandmother, and paternal grandmother; Liver disease in her father; Lung cancer in her father and paternal grandmother; Ovarian cancer in her maternal grandmother; Throat cancer in her paternal uncle; Thyroid disease in her paternal aunt.     Assessment/Plan:   Obstructive sleep apnea. - she is compliant with CPAP and reports benefit from therapy - she uses [employer] for her DME - her current device is more than 36 yrs old, not working properly, and not amenable to repair -  will arrange for new auto CPAP range 5 to 20 cm H2O; she is okay to get a Resmed or Luna device   Obesity. - she is working on diet modification   Tobacco abuse. - she is reluctant to try chantix - she remains on bupropion for depression - advised she can use nicotine replacement also  Cough. 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 - followed by [facility]  Time Spent Involved in Patient Care on Day of Examination:  34 minutes  Follow up:   Patient Instructions  Will arrange for new CPAP machine  Follow up in 4 months  Medication List:   Allergies as of 07/29/2021   No Known  Allergies      Medication List        Accurate as of July 29, 2021  9:55 AM. If you have any questions, ask your nurse or doctor.          STOP taking these medications    DULoxetine 60 MG capsule Commonly known as: Cymbalta Stopped by: Chesley Mires, MD   SV Melatonin 5 MG Tabs Generic drug: melatonin Stopped by: Chesley Mires, MD       TAKE these medications    albuterol 108 (90 Base) MCG/ACT inhaler Commonly known as: VENTOLIN HFA Inhale 2 puffs into the lungs every 6 (six) hours as needed for wheezing or shortness of breath.   atorvastatin 20 MG tablet Commonly known as: LIPITOR Take 1 tablet (20 mg total) by mouth daily.   blood glucose meter kit and supplies Dispense based on patient and insurance preference. Use up to four times daily as directed. (FOR ICD-10 E10.9, E11.9).   buPROPion 300 MG 24 hr tablet Commonly known as: WELLBUTRIN XL Take 300 mg by mouth every morning. What changed: Another medication with the same name was removed. Continue taking this medication, and follow the directions you see here. Changed by: Chesley Mires, MD   cyclobenzaprine 10 MG tablet Commonly known as: FLEXERIL Take 1 tablet (10 mg total) by mouth 3 (three) times daily as needed for muscle spasms.   diclofenac 75 MG EC tablet Commonly known as: VOLTAREN Take 1 tablet (75 mg total) by mouth 2 (two) times daily.   empagliflozin 10 MG Tabs tablet Commonly known as: Jardiance Take 1 tablet (10 mg total) by mouth daily.   fexofenadine 180 MG tablet Commonly known as: ALLEGRA Take 1 tablet by mouth once daily   fluticasone 50 MCG/ACT nasal spray Commonly known as: FLONASE Place 2 sprays into both nostrils daily.   gabapentin 100 MG capsule Commonly known as: NEURONTIN TAKE 1 CAPSULE BY MOUTH THREE TIMES DAILY   hydrochlorothiazide 12.5 MG tablet Commonly known as: HYDRODIURIL Take 1 tablet (12.5 mg total) by mouth daily.   metFORMIN 500 MG 24 hr tablet Commonly  known as: GLUCOPHAGE-XR Take 2 tablets (1,000 mg total) by mouth daily. (NEEDS TO BE SEEN BEFORE NEXT REFILL)   metoprolol succinate 25 MG 24 hr tablet Commonly known as: TOPROL-XL TAKE 1 TABLET BY MOUTH ONCE DAILY WITH MEALS OR  IMMEDIATELY  FOLLOWING   multivitamin with minerals Tabs tablet Take 1 tablet by mouth daily. womens   norethindrone 0.35 MG tablet Commonly known as: MICRONOR Take 1 tablet by mouth once daily   nystatin powder Commonly known as: MYCOSTATIN/NYSTOP Apply 1 application topically 2 (two) times daily.   omeprazole 40 MG capsule Commonly known as: PRILOSEC Take 1 capsule (  40 mg total) by mouth daily.   oxyCODONE 5 MG immediate release tablet Commonly known as: Roxicodone Take 1 tablet (5 mg total) by mouth every 12 (twelve) hours as needed for severe pain. What changed: Another medication with the same name was removed. Continue taking this medication, and follow the directions you see here. Changed by: Chesley Mires, MD   podofilox 0.5 % gel Commonly known as: CONDYLOX Apply topically 2 (two) times daily.   PROBIOTIC DAILY PO Take 1 capsule by mouth daily. Women's care   silver sulfADIAZINE 1 % cream Commonly known as: Silvadene Apply 1 application topically 2 (two) times daily.   spironolactone 100 MG tablet Commonly known as: ALDACTONE Take 1 tablet (100 mg total) by mouth daily.   Trulicity 1.5 GL/8.7FI Sopn Generic drug: Dulaglutide Inject 1.5 mg into the skin once a week.   ursodiol 500 MG tablet Commonly known as: ACTIGALL TAKE 2 TABLETS BY MOUTH TWICE DAILY WITH A MEAL   venlafaxine XR 75 MG 24 hr capsule Commonly known as: EFFEXOR-XR Take 75 mg by mouth daily.        Signature:  Chesley Mires, MD [facility] Pager - [phone] 07/29/2021, 9:55 AM

## 2021-07-29 NOTE — Patient Instructions (Signed)
Will arrange for new CPAP machine  Follow up in 4 months

## 2021-07-30 ENCOUNTER — Encounter: Payer: Self-pay | Admitting: Family

## 2021-07-30 ENCOUNTER — Ambulatory Visit (INDEPENDENT_AMBULATORY_CARE_PROVIDER_SITE_OTHER): Payer: Medicaid Other | Admitting: Family

## 2021-07-30 DIAGNOSIS — R399 Unspecified symptoms and signs involving the genitourinary system: Secondary | ICD-10-CM | POA: Diagnosis not present

## 2021-07-30 MED ORDER — CEPHALEXIN 500 MG PO CAPS: 500.0000 mg | ORAL_CAPSULE | Freq: Two times a day (BID) | ORAL | 0 refills | Status: DC

## 2021-07-30 NOTE — Progress Notes (Signed)
   Virtual Visit  Note Due to COVID-19 pandemic this visit was conducted virtually. This visit type was conducted due to national recommendations for restrictions regarding the COVID-19 Pandemic (e.g. social distancing, sheltering in place) in an effort to limit this patient's exposure and mitigate transmission in our community. All issues noted in this document were discussed and addressed.  A physical exam was not performed with this format.  I connected with Heidi Landry on 07/30/21 at 11:13 AM  by telephone and verified that I am speaking with the correct person using two identifiers. Heidi Landry is currently located at outside and no one is currently with her during visit. The provider, Evelina Dun, FNP is located in their office at time of visit.  I discussed the limitations, risks, security and privacy concerns of performing an evaluation and management service by telephone and the availability of in person appointments. I also discussed with the patient that there may be a patient responsible charge related to this service. The patient expressed understanding and agreed to proceed.   History and Present Illness:  Dysuria  This is a new problem. The current episode started 1 to 4 weeks ago. The problem occurs intermittently. The problem has been waxing and waning. Quality: pressure. The pain is at a severity of 4/10. There has been no fever. Associated symptoms include frequency, hesitancy, nausea and urgency. Pertinent negatives include no hematuria or vomiting. She has tried increased fluids for the symptoms. The treatment provided mild relief.     Review of Systems  Gastrointestinal:  Positive for nausea. Negative for vomiting.  Genitourinary:  Positive for dysuria, frequency, hesitancy and urgency. Negative for hematuria.    Observations/Objective: No SOB or distress noted  Assessment and Plan: 1. UTI symptoms Force fluids AZO over the counter X2 days RTO if symptoms  worsen or do not improve  - cephALEXin (KEFLEX) 500 MG capsule; Take 1 capsule (500 mg total) by mouth 2 (two) times daily.  Dispense: 14 capsule; Refill: 0    I discussed the assessment and treatment plan with the patient. The patient was provided an opportunity to ask questions and all were answered. The patient agreed with the plan and demonstrated an understanding of the instructions.   The patient was advised to call back or seek an in-person evaluation if the symptoms worsen or if the condition fails to improve as anticipated.  The above assessment and management plan was discussed with the patient. The patient verbalized understanding of and has agreed to the management plan. Patient is aware to call the clinic if symptoms persist or worsen. Patient is aware when to return to the clinic for a follow-up visit. Patient educated on when it is appropriate to go to the emergency department.   Time call ended:  11:24 AM   I provided 11 minutes of  non face-to-face time during this encounter.    Evelina Dun, FNP

## 2021-07-31 DIAGNOSIS — R7989 Other specified abnormal findings of blood chemistry: Secondary | ICD-10-CM | POA: Diagnosis not present

## 2021-08-01 ENCOUNTER — Other Ambulatory Visit: Payer: Self-pay | Admitting: Family

## 2021-08-01 ENCOUNTER — Other Ambulatory Visit: Payer: Self-pay | Admitting: Gastroenterology

## 2021-08-01 DIAGNOSIS — E119 Type 2 diabetes mellitus without complications: Secondary | ICD-10-CM

## 2021-08-01 DIAGNOSIS — G8929 Other chronic pain: Secondary | ICD-10-CM

## 2021-08-01 DIAGNOSIS — E781 Pure hyperglyceridemia: Secondary | ICD-10-CM

## 2021-08-01 DIAGNOSIS — M5442 Lumbago with sciatica, left side: Secondary | ICD-10-CM

## 2021-08-05 NOTE — Telephone Encounter (Signed)
PATIENT WAS ABLE TO PICK UP AT PHARMACY

## 2021-08-06 NOTE — Progress Notes (Deleted)
NO SHOW

## 2021-08-07 ENCOUNTER — Inpatient Hospital Stay (HOSPITAL_COMMUNITY): Payer: Medicaid Other | Attending: Hematology | Admitting: Physician Assistant

## 2021-08-07 ENCOUNTER — Other Ambulatory Visit (HOSPITAL_COMMUNITY): Payer: Medicaid Other

## 2021-08-07 ENCOUNTER — Encounter (HOSPITAL_COMMUNITY): Payer: Self-pay

## 2021-08-09 DIAGNOSIS — E119 Type 2 diabetes mellitus without complications: Secondary | ICD-10-CM | POA: Diagnosis not present

## 2021-08-09 DIAGNOSIS — N3944 Nocturnal enuresis: Secondary | ICD-10-CM | POA: Diagnosis not present

## 2021-08-09 DIAGNOSIS — R32 Unspecified urinary incontinence: Secondary | ICD-10-CM | POA: Diagnosis not present

## 2021-08-11 ENCOUNTER — Other Ambulatory Visit: Payer: Self-pay | Admitting: Family

## 2021-08-13 DIAGNOSIS — Z9884 Bariatric surgery status: Secondary | ICD-10-CM | POA: Diagnosis not present

## 2021-08-14 ENCOUNTER — Ambulatory Visit: Payer: Medicaid Other | Admitting: Neurology

## 2021-08-14 ENCOUNTER — Encounter: Payer: Self-pay | Admitting: Neurology

## 2021-08-14 VITALS — BP 112/73 | HR 87 | Ht 61.0 in | Wt 322.8 lb

## 2021-08-14 DIAGNOSIS — G5622 Lesion of ulnar nerve, left upper limb: Secondary | ICD-10-CM | POA: Diagnosis not present

## 2021-08-14 DIAGNOSIS — G5603 Carpal tunnel syndrome, bilateral upper limbs: Secondary | ICD-10-CM | POA: Diagnosis not present

## 2021-08-14 DIAGNOSIS — M5416 Radiculopathy, lumbar region: Secondary | ICD-10-CM | POA: Diagnosis not present

## 2021-08-14 NOTE — Progress Notes (Signed)
Subjective:    Patient ID: Heidi Landry is a 35 y.o. female.  HPI    History:  Dear Heidi Landry,     I saw your patient, Heidi Landry, upon your kind request in my neurologic clinic today for initial consultation of her widespread pain.  The patient is unaccompanied today.  As you know, Heidi Landry is a 35 year old right-handed woman with an underlying complex medical history of atrial fibrillation, chest pain, chronic back pain, edema, diabetes, irritable bowel syndrome, hypertension, anxiety, carpal tunnel syndrome, sleep apnea on positive airway pressure treatment, history of thoracic spine fracture, and morbid obesity with a BMI of over 60, who reports  A several year history of numbness and pain in both hands and the wrist area.  She also has degenerative lumbar spine disease and has radiating pain to the legs, particularly on the left side.  She has seen orthopedics for her carpal tunnel in the past and reports having had EMG testing done.  She was told she had bilateral carpal tunnel and was supposed to have surgery.  She could not pursue surgery for this.  In fact, she also had pain in the left elbow and was told that she had to have surgery to the left elbow for nerve decompression as well as the left wrist.  She had received epidural steroid injections in the lower back which had not really helped but eventually may need surgery as she was told in the past.  She had seen Dr. Nelva Bush for her injection into the lower back.  She recently had a consultation with [facility] bariatrics for possible bariatric surgery, she would be a candidate for gastric bypass.  She has had weight fluctuation.  I reviewed your office note from 03/18/2021.  She had recent changes in her medication.  She had stopped Zoloft and started Cymbalta.  After seeing behavioral health, she was in the process of weaning off Cymbalta and trying a different medication.  She had blood work through your office at the time and I reviewed  the results: Glucose level was elevated at 174, BUN 13, creatinine 0.6, both unremarkable, chloride slightly Landry at 95.  Alkaline phosphatase was elevated at 139, AST mildly elevated at 46, ALT elevated at 61.  CBC showed WBC of 12.8, mildly elevated, MCH was borderline at 26, platelets normal at 238.  She had additional blood work recently through the [place] when see saw GI, and I was able to review results from 07/31/2021: She had a positive ANA with a titer of 1:40, anti-smooth muscle antibody screen was negative.  Immunoglobulin profile was unremarkable.  Alkaline phosphatase was elevated at 143, AST normal, ALT mildly elevated at 46.  I had evaluated her about a year and a half ago for sleep apnea.  She was on CPAP therapy at the time.  She saw Vaughan Browner, NP, on 02/12/2021 at which time she was compliant with CPAP.  She has since then established care for sleep apnea management with pulmonology. She recently saw Dr. Halford Chessman in pulmonology for sleep apnea FU and a new CPAP machine was ordered. She was advised to FU in 4 months; I reviewed the office note from 07/29/21. She reports doing well with her CPAP and she is waiting for a new machine.   Previously:   02/12/21: 35 year old right-handed woman with an underlying medical history of irritable bowel syndrome, hypertension, diabetes, chronic back pain, history of thoracic spine fracture, history of A. fib, anxiety, adrenal adenoma, edema, carpal tunnel  syndrome, varicose veins and morbid obesity with a BMI of over 50, who was diagnosed with obstructive sleep apnea in 2016.  She has been on CPAP therapy.  She brought her machine today but did not bring the power cord and we do not have a matching power cord unfortunately.  A CPAP compliance download was not possible.  Her sleep specialist, Dr. Sinda Du retired.  She had sleep study testing on 11/24/2015.  She was pregnant at the time and had difficulty tolerating CPAP.  She had a sleep study  on 12/28/2015 which showed a total AHI of 50.5/h.  Oxygen desaturation nadir was 74%.  Trial of AutoPap was recommended at the time.  She lives with her husband and 2 sons, ages 75 and 59. She currently does not work, she smokes 1 pack/day, she does not utilize any alcohol, drinks caffeine in the form of coffee, 1 to 3 cups daily and occasional diet soda. She reports compliance with her machine, she feels that she cannot sleep without it.  She had significant improvement of her sleep quality once she got on the machine.  She needs new supplies.  She particularly noticed an improvement in her nocturia and enuresis when she started using her machine. Her weight has been fluctuating, she has lost some weight over the past year.  She is motivated to continue to work on weight loss.  She has quit smoking in the past, she plans to quit smoking again. Bedtime is currently between 8 and 10 PM, rise time around 6 or earlier.   Her Past Medical History Is Significant For: Past Medical History:  Diagnosis Date   Adrenal adenoma    right   Anxiety    Atrial fibrillation (HCC)    Carpal tunnel syndrome    Carpal tunnel syndrome    Carpal tunnel syndrome, bilateral    Chest pain    Chronic back pain    Degenerative disc disease    Edema    Gestational diabetes mellitus, antepartum    Gestational diabetes mellitus, class A2/B 08/08/2015   Early 2hr: 84/186/117 @ 12wks   A1/B  Notified & dietician referral ordered 08/08/15    Hypertension    IBS (irritable bowel syndrome)    Panic attacks    Placental abruption in third trimester 12/07/2015   Post partum depression    Sleep apnea    Spondylosis    Tendonitis    Thoracic spine fracture ([place]) 04/13/2012   Varicose veins of left lower extremity with complications 92/33/0076    Her Past Surgical History Is Significant For: Past Surgical History:  Procedure Laterality Date   CESAREAN SECTION N/A 12/08/2015   Procedure: CESAREAN SECTION;  Surgeon: Jonnie Kind, MD;  Location: [facility];  Service: Obstetrics;  Laterality: N/A;   CHOLECYSTECTOMY     KNEE SURGERY Right 01/29/2019   at [facility] in [place], [place]; s/p fall and deel leg laceration    Her Family History Is Significant For: Family History  Problem Relation Age of Onset   Fibromyalgia Mother    Carpal tunnel syndrome Mother    Hernia Father    Lung cancer Father    Colon cancer Father 67       partial colectomy   Liver disease Father        Fatty liver; ? ETOH-related   Hypertension Father    Diabetes Maternal Grandmother    Hypertension Maternal Grandmother    Ovarian cancer Maternal Grandmother  Cirrhosis Maternal Grandmother        alcohol   Cancer Paternal Uncle    Brain cancer Paternal Uncle    Throat cancer Paternal Uncle    Heart attack Maternal Grandfather    Hyperlipidemia Paternal Grandmother    Hypertension Paternal Grandmother    Lung cancer Paternal Grandmother    Thyroid disease Paternal Aunt     Her Social History Is Significant For: Social History   Socioeconomic History   Marital status: Married    Spouse name: Not on file   Number of children: 1   Years of education: 12   Highest education level: Not on file  Occupational History   Occupation: disabled-[employer]    Employer: [employer]  Tobacco Use   Smoking status: Every Day    Packs/day: 0.50    Years: 9.00    Pack years: 4.50    Types: Cigarettes    Start date: 05/25/2006   Smokeless tobacco: Never   Tobacco comments:    Currently smokes a pack to a pack and a half a day [facility] 07/29/21  Vaping Use   Vaping Use: Never used  Substance and Sexual Activity   Alcohol use: Not Currently    Alcohol/week: 0.0 standard drinks    Comment: rare   Drug use: No   Sexual activity: Yes    Birth control/protection: Pill  Other Topics Concern   Not on file  Social History Narrative   Not on file   Social Determinants of Health   Financial Resource Strain: Not  on file  Food Insecurity: Not on file  Transportation Needs: Not on file  Physical Activity: Not on file  Stress: Not on file  Social Connections: Not on file    Her Allergies Are:  No Known Allergies:   Her Current Medications Are:  Outpatient Encounter Medications as of 08/14/2021  Medication Sig   Accu-Chek Softclix Lancets lancets Test BS up to 4 times daily as directed Dx E11.9   albuterol (VENTOLIN HFA) 108 (90 Base) MCG/ACT inhaler Inhale 2 puffs into the lungs every 6 (six) hours as needed for wheezing or shortness of breath.   atorvastatin (LIPITOR) 20 MG tablet Take 1 tablet (20 mg total) by mouth daily.   blood glucose meter kit and supplies Dispense based on patient and insurance preference. Use up to four times daily as directed. (FOR ICD-10 E10.9, E11.9).   buPROPion (WELLBUTRIN XL) 300 MG 24 hr tablet Take 300 mg by mouth every morning.   cyclobenzaprine (FLEXERIL) 10 MG tablet Take 1 tablet by mouth three times daily as needed for muscle spasm   diclofenac (VOLTAREN) 75 MG EC tablet Take 1 tablet (75 mg total) by mouth 2 (two) times daily.   Dulaglutide (TRULICITY) 1.5 PV/3.7SM SOPN Inject 1.5 mg into the skin once a week.   empagliflozin (JARDIANCE) 10 MG TABS tablet Take 1 tablet (10 mg total) by mouth daily.   fexofenadine (ALLEGRA) 180 MG tablet Take 1 tablet by mouth once daily   fluticasone (FLONASE) 50 MCG/ACT nasal spray Place 2 sprays into both nostrils daily.   gabapentin (NEURONTIN) 100 MG capsule TAKE 1 CAPSULE BY MOUTH THREE TIMES DAILY   glucose blood (ACCU-CHEK GUIDE) test strip Test BS up to 4 times daily as directed Dx E11.9   hydrochlorothiazide (HYDRODIURIL) 12.5 MG tablet Take 1 tablet (12.5 mg total) by mouth daily.   metFORMIN (GLUCOPHAGE-XR) 500 MG 24 hr tablet Take 2 tablets (1,000 mg total) by mouth daily with  breakfast.   metoprolol succinate (TOPROL-XL) 25 MG 24 hr tablet TAKE 1 TABLET BY MOUTH ONCE DAILY WITH MEALS OR  IMMEDIATELY  FOLLOWING    Multiple Vitamin (MULTIVITAMIN WITH MINERALS) TABS tablet Take 1 tablet by mouth daily. womens   norethindrone (MICRONOR) 0.35 MG tablet Take 1 tablet by mouth once daily   nystatin (MYCOSTATIN/NYSTOP) powder Apply 1 application topically 2 (two) times daily.   omeprazole (PRILOSEC) 40 MG capsule Take 1 capsule (40 mg total) by mouth daily.   oxyCODONE (ROXICODONE) 5 MG immediate release tablet Take 1 tablet (5 mg total) by mouth every 12 (twelve) hours as needed for severe pain.   podofilox (CONDYLOX) 0.5 % gel Apply topically 2 (two) times daily.   Probiotic Product (PROBIOTIC DAILY PO) Take 1 capsule by mouth daily. Women's care   silver sulfADIAZINE (SILVADENE) 1 % cream Apply 1 application topically 2 (two) times daily.   spironolactone (ALDACTONE) 100 MG tablet Take 1 tablet (100 mg total) by mouth daily.   ursodiol (ACTIGALL) 500 MG tablet TAKE 2 TABLETS BY MOUTH TWICE DAILY WITH A MEAL   venlafaxine XR (EFFEXOR-XR) 75 MG 24 hr capsule Take 75 mg by mouth daily.   [DISCONTINUED] cephALEXin (KEFLEX) 500 MG capsule Take 1 capsule (500 mg total) by mouth 2 (two) times daily.   No facility-administered encounter medications on file as of 08/14/2021.  :  Review of Systems:  Out of a complete 14 point review of systems, all are reviewed and negative with the exception of these symptoms as listed below:   Review of Systems  Neurological:        Pt is here today for nerve pain in legs and back. Carpal tunnel in both hands. Pt. Stated that hands are burning and numb. Unable to hold or lift certain things    Objective:  Neurological Exam  Physical Exam Physical Examination:   Vitals:   08/14/21 0948  BP: 112/73  Pulse: 87    General Examination: The patient is a very pleasant 35 y.o. female in no acute distress. She appears well-developed and well-nourished and well groomed.   HEENT: Normocephalic, atraumatic, pupils are equal, round and reactive to light, extraocular tracking is  good without limitation to gaze excursion or nystagmus noted. Hearing is grossly intact. Face is symmetric with normal facial animation and normal sensation. Speech is clear with no dysarthria noted. There is no hypophonia. There is no lip, neck/head, jaw or voice tremor. Neck is supple with full range of passive and active motion. There are no carotid bruits on auscultation. Oropharynx exam reveals: moderate mouth dryness, adequate dental hygiene and moderate airway crowding.  Tongue protrudes centrally and palate elevates symmetrically.   Chest: Clear to auscultation without wheezing, rhonchi or crackles noted.   Heart: S1+S2+0, regular and normal without murmurs, rubs or gallops noted.    Abdomen: Soft, non-tender and non-distended.   Extremities: There is no pitting edema in the distal lower extremities bilaterally.    Skin: Warm and dry without trophic changes noted.    Musculoskeletal: exam reveals a large scar across knee area on the right and upwards.   Neurologically:  Mental status: The patient is awake, alert and oriented in all 4 spheres. Her immediate and remote memory, attention, language skills and fund of knowledge are appropriate. There is no evidence of aphasia, agnosia, apraxia or anomia. Speech is clear with normal prosody and enunciation. Thought process is linear. Mood is normal and affect is normal.  Cranial nerves II -  XII are as described above under HEENT exam.  Motor exam: Normal bulk, strength and tone is noted with the exception of mild grip strength weakness on the right and mild effacement of the right thenar eminence, no fasciculations, no other atrophy noted.  Positive Tinel's bilaterally, positive Phalen's bilaterally.   There is no tremor. Fine motor skills and coordination: grossly intact.   Cerebellar testing: No dysmetria or intention tremor. There is no truncal or gait ataxia.  Sensory exam: intact to light touch, temperature, pinprick, and vibration in the  upper and lower extremities.  Gait, station and balance: She stands easily. No veering to one side is noted. No leaning to one side is noted. Posture is age-appropriate and stance is narrow based. Gait shows normal stride length and normal pace. No problems turning are noted.  Assessment and Plan:  In summary, Eveline Sauve is a very pleasant 34 year old female with an underlying medical history of irritable bowel syndrome, hypertension, diabetes, chronic back pain, history of thoracic spine fracture, history of A. fib, anxiety, adrenal adenoma, edema, carpal tunnel syndrome, varicose veins and morbid obesity with a BMI of over 60, who presents for evaluation of her numbness and pain affecting both hands as well as the left elbow area and lower back pain.  She has been diagnosed with degenerative lumbar spine disease.  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  Symptoms are in keeping with this, findings on exam would also support bilateral carpal tunnel.  She is advised to make an appointment with her orthopedic specialist.  Ultimately, she will probably need surgery.  She is advised to start using a wrist splint overnight.  She sometimes uses a splint during the day but cannot keep it on because it affects her fine motor abilities of course.  She is advised to proceed with an EMG nerve conduction velocity test which we will arrange through our office and we will call her with the results.  She is advised to go ahead and pursue appointment with her prior orthopedic specialist and pain management specialist.  She is encouraged to continue to strive for weight loss.  She had a recent consultation for bariatrics.  She is compliant with her CPAP and followed by pulmonology, she will need no further follow-up for sleep apnea through our office.  She is advised that for now we will call her with her EMG and nerve conduction velocity test results.  I  answered all her questions today and she was in agreement.  Thank you very much for allowing me to participate in the care of this nice patient. If I can be of any further assistance to you please do not hesitate to call me at [phone].   Sincerely,     Star Age, MD, PhD

## 2021-08-14 NOTE — Patient Instructions (Signed)
It was nice to see you again today.  I am sorry you have had ongoing issues with your carpal tunnel and elbow pain on the left as well as low back pain.  Ultimately, I do believe you will benefit from seeing your orthopedic specialist and Dr. Nelva Bush again.  Please make an appointment for discussion of carpal tunnel surgery.  As discussed, we will go ahead and schedule you for an EMG and nerve conduction velocity test, which is an electrical nerve and muscle test to evaluate for carpal tunnel and nerve compression at the elbow on the left, as well as to look for signs of nerve root irritation in the lower back.  Unfortunately, there is not a whole lot I can offer you other than advised to see orthopedic specialist again.  Please continue to work on weight loss.  We will cancel your appointment with [facility] for next year as you have seen Dr. Halford Chessman and pulmonology for your sleep apnea.

## 2021-08-15 ENCOUNTER — Other Ambulatory Visit: Payer: Self-pay | Admitting: Adult Health

## 2021-08-21 ENCOUNTER — Encounter: Payer: Self-pay | Admitting: Family

## 2021-08-21 LAB — HM DIABETES EYE EXAM

## 2021-08-22 ENCOUNTER — Ambulatory Visit: Payer: Self-pay | Admitting: Pharmacist

## 2021-08-22 DIAGNOSIS — G4733 Obstructive sleep apnea (adult) (pediatric): Secondary | ICD-10-CM | POA: Diagnosis not present

## 2021-08-29 ENCOUNTER — Other Ambulatory Visit: Payer: Self-pay | Admitting: Adult Health

## 2021-08-29 ENCOUNTER — Other Ambulatory Visit: Payer: Self-pay | Admitting: Family

## 2021-08-29 DIAGNOSIS — G8929 Other chronic pain: Secondary | ICD-10-CM

## 2021-08-29 DIAGNOSIS — M5442 Lumbago with sciatica, left side: Secondary | ICD-10-CM

## 2021-09-12 ENCOUNTER — Other Ambulatory Visit: Payer: Self-pay | Admitting: Family

## 2021-09-12 DIAGNOSIS — M5442 Lumbago with sciatica, left side: Secondary | ICD-10-CM

## 2021-09-12 DIAGNOSIS — G8929 Other chronic pain: Secondary | ICD-10-CM

## 2021-09-23 ENCOUNTER — Encounter: Payer: Self-pay | Admitting: Family

## 2021-09-23 ENCOUNTER — Ambulatory Visit: Payer: Medicaid Other | Admitting: Family

## 2021-09-23 ENCOUNTER — Other Ambulatory Visit: Payer: Self-pay

## 2021-09-23 VITALS — BP 132/84 | HR 93 | Temp 98.0°F | Ht 61.0 in | Wt 315.2 lb

## 2021-09-23 DIAGNOSIS — K7581 Nonalcoholic steatohepatitis (NASH): Secondary | ICD-10-CM

## 2021-09-23 DIAGNOSIS — G4733 Obstructive sleep apnea (adult) (pediatric): Secondary | ICD-10-CM

## 2021-09-23 DIAGNOSIS — I152 Hypertension secondary to endocrine disorders: Secondary | ICD-10-CM

## 2021-09-23 DIAGNOSIS — E1159 Type 2 diabetes mellitus with other circulatory complications: Secondary | ICD-10-CM

## 2021-09-23 DIAGNOSIS — Z23 Encounter for immunization: Secondary | ICD-10-CM

## 2021-09-23 DIAGNOSIS — F411 Generalized anxiety disorder: Secondary | ICD-10-CM

## 2021-09-23 DIAGNOSIS — E1169 Type 2 diabetes mellitus with other specified complication: Secondary | ICD-10-CM

## 2021-09-23 DIAGNOSIS — E781 Pure hyperglyceridemia: Secondary | ICD-10-CM | POA: Diagnosis not present

## 2021-09-23 DIAGNOSIS — K760 Fatty (change of) liver, not elsewhere classified: Secondary | ICD-10-CM

## 2021-09-23 DIAGNOSIS — K219 Gastro-esophageal reflux disease without esophagitis: Secondary | ICD-10-CM

## 2021-09-23 DIAGNOSIS — G8929 Other chronic pain: Secondary | ICD-10-CM

## 2021-09-23 DIAGNOSIS — F339 Major depressive disorder, recurrent, unspecified: Secondary | ICD-10-CM

## 2021-09-23 DIAGNOSIS — Z6841 Body Mass Index (BMI) 40.0 and over, adult: Secondary | ICD-10-CM

## 2021-09-23 DIAGNOSIS — M5440 Lumbago with sciatica, unspecified side: Secondary | ICD-10-CM

## 2021-09-23 DIAGNOSIS — E785 Hyperlipidemia, unspecified: Secondary | ICD-10-CM

## 2021-09-23 DIAGNOSIS — F17200 Nicotine dependence, unspecified, uncomplicated: Secondary | ICD-10-CM

## 2021-09-23 LAB — BAYER DCA HB A1C WAIVED: HB A1C (BAYER DCA - WAIVED): 6.2 % — ABNORMAL HIGH (ref 4.8–5.6)

## 2021-09-23 NOTE — Addendum Note (Signed)
Addended by: Brynda Peon F on: 09/23/2021 12:20 PM   Modules accepted: Orders

## 2021-09-23 NOTE — Progress Notes (Signed)
Subjective:    Patient ID: Heidi Landry, female    DOB: 1986/10/30, 35 y.o.   MRN: [id number]  Chief Complaint  Patient presents with   Follow-up   PT presents to the office today for chronic follow up. She is followed by GI every 3 months for NASH. She is followed by [facility] for GAD and Depression.   She has a bariatric weight loss appointment in a few months.   She had Leukocytosis and we did a referral to Hematologists. The was thought to be related to steroid and recent COVID.    She is also complaining of generalized nerve pain in feet, legs, buttocks, chest, and arms of a 6 out 10. She reports she has seen a neurologists in the past and has a follow up scheduled.    Hypertension This is a chronic problem. The current episode started more than 1 year ago. The problem has been resolved since onset. The problem is controlled. Pertinent negatives include no blurred vision, malaise/fatigue, peripheral edema or shortness of breath. Risk factors for coronary artery disease include dyslipidemia, diabetes mellitus, obesity, smoking/tobacco exposure and sedentary lifestyle. The current treatment provides moderate improvement.  Diabetes She presents for her follow-up diabetic visit. She has type 2 diabetes mellitus. Hypoglycemia symptoms include nervousness/anxiousness. Associated symptoms include foot paresthesias. Pertinent negatives for diabetes include no blurred vision. Symptoms are stable. Risk factors for coronary artery disease include dyslipidemia, diabetes mellitus, hypertension, sedentary lifestyle and post-menopausal. She is following a generally unhealthy diet. Her overall blood glucose range is 110-130 mg/dl. An ACE inhibitor/angiotensin II receptor blocker is being taken. Eye exam is current.  Hyperlipidemia This is a chronic problem. The current episode started more than 1 year ago. The problem is controlled. Recent lipid tests were reviewed and are normal. Exacerbating  diseases include obesity. Pertinent negatives include no shortness of breath. Current antihyperlipidemic treatment includes statins. The current treatment provides moderate improvement of lipids. Risk factors for coronary artery disease include dyslipidemia, diabetes mellitus, hypertension, a sedentary lifestyle and post-menopausal.  Gastroesophageal Reflux She complains of belching and heartburn. This is a chronic problem. The current episode started more than 1 year ago. The problem occurs occasionally. Risk factors include obesity. She has tried a PPI for the symptoms. The treatment provided moderate relief.  Back Pain This is a chronic problem. The current episode started more than 1 year ago. The problem occurs intermittently. The problem has been waxing and waning since onset. The pain is present in the lumbar spine. The pain is at a severity of 8/10. The pain is moderate.  Nicotine Dependence Presents for follow-up visit. Symptoms include irritability. Her urge triggers include company of smokers. The symptoms have been stable. She smokes 1 pack of cigarettes per day.  Anxiety Presents for follow-up visit. Symptoms include depressed mood, excessive worry, irritability, nervous/anxious behavior and restlessness. Patient reports no shortness of breath. Symptoms occur occasionally. The severity of symptoms is moderate.    Depression        This is a chronic problem.  The current episode started more than 1 year ago.   The problem occurs intermittently.  Associated symptoms include irritable, restlessness and sad.  Associated symptoms include no helplessness and no hopelessness.    Review of Systems  Constitutional:  Positive for irritability. Negative for malaise/fatigue.  Eyes:  Negative for blurred vision.  Respiratory:  Negative for shortness of breath.   Gastrointestinal:  Positive for heartburn.  Musculoskeletal:  Positive for back pain.  Psychiatric/Behavioral:  Positive for  depression. The patient is nervous/anxious.   All other systems reviewed and are negative.     Objective:   Physical Exam Vitals reviewed.  Constitutional:      General: She is irritable. She is not in acute distress.    Appearance: She is well-developed. She is obese.  HENT:     Head: Normocephalic and atraumatic.     Right Ear: Tympanic membrane normal.     Left Ear: Tympanic membrane normal.  Eyes:     Pupils: Pupils are equal, round, and reactive to light.  Neck:     Thyroid: No thyromegaly.  Cardiovascular:     Rate and Rhythm: Normal rate and regular rhythm.     Heart sounds: Normal heart sounds. No murmur heard. Pulmonary:     Effort: Pulmonary effort is normal. No respiratory distress.     Breath sounds: Normal breath sounds. No wheezing.  Abdominal:     General: Bowel sounds are normal. There is no distension.     Palpations: Abdomen is soft.     Tenderness: There is no abdominal tenderness.  Musculoskeletal:        General: No tenderness. Normal range of motion.     Cervical back: Normal range of motion and neck supple.  Skin:    General: Skin is warm and dry.  Neurological:     Mental Status: She is alert and oriented to person, place, and time.     Cranial Nerves: No cranial nerve deficit.     Deep Tendon Reflexes: Reflexes are normal and symmetric.  Psychiatric:        Behavior: Behavior normal.        Thought Content: Thought content normal.        Judgment: Judgment normal.    Diabetic Foot Exam - Simple   Simple Foot Form Diabetic Foot exam was performed with the following findings: Yes 09/23/2021 11:47 AM  Visual Inspection No deformities, no ulcerations, no other skin breakdown bilaterally: Yes Sensation Testing Intact to touch and monofilament testing bilaterally: Yes Pulse Check Posterior Tibialis and Dorsalis pulse intact bilaterally: Yes Comments     BP 132/84   Pulse 93   Temp 98 F (36.7 C) (Temporal)   Ht 5' 1"  (1.549 m)   Wt (!)  315 lb 3.2 oz (143 kg)   BMI 59.56 kg/m      Assessment & Plan:  Brantley Naser comes in today with chief complaint of Follow-up   Diagnosis and orders addressed:  1. Hypertension associated with type 2 diabetes mellitus ([place])  2. OSA (obstructive sleep apnea)  3. NASH (nonalcoholic steatohepatitis)  4. NAFLD (nonalcoholic fatty liver disease)  5. GERD without esophagitis  6. Type 2 diabetes mellitus with hypertriglyceridemia (HCC)  - Bayer DCA Hb A1c Waived  7. Hyperlipidemia associated with type 2 diabetes mellitus ([place])  8. Chronic left-sided low back pain with sciatica, sciatica laterality unspecified   9. Depression, recurrent ([place])   10. Morbid obesity with BMI of 60.0-69.9, adult ([place])   11. Smoker   12. Generalized anxiety disorder   Labs reviewed from 07/31/21 Health Maintenance reviewed Diet and exercise encouraged  Follow up plan: 3 month   Evelina Dun, FNP

## 2021-09-23 NOTE — Patient Instructions (Signed)
Diabetes Mellitus and Nutrition, Adult When you have diabetes, or diabetes mellitus, it is very important to have healthy eating habits because your blood sugar (glucose) levels are greatly affected by what you eat and drink. Eating healthy foods in the right amounts, at about the same times every day, can help you:  Control your blood glucose.  Lower your risk of heart disease.  Improve your blood pressure.  Reach or maintain a healthy weight. What can affect my meal plan? Every person with diabetes is different, and each person has different needs for a meal plan. Your health care provider may recommend that you work with a dietitian to make a meal plan that is best for you. Your meal plan may vary depending on factors such as:  The calories you need.  The medicines you take.  Your weight.  Your blood glucose, blood pressure, and cholesterol levels.  Your activity level.  Other health conditions you have, such as heart or kidney disease. How do carbohydrates affect me? Carbohydrates, also called carbs, affect your blood glucose level more than any other type of food. Eating carbs naturally raises the amount of glucose in your blood. Carb counting is a method for keeping track of how many carbs you eat. Counting carbs is important to keep your blood glucose at a healthy level, especially if you use insulin or take certain oral diabetes medicines. It is important to know how many carbs you can safely have in each meal. This is different for every person. Your dietitian can help you calculate how many carbs you should have at each meal and for each snack. How does alcohol affect me? Alcohol can cause a sudden decrease in blood glucose (hypoglycemia), especially if you use insulin or take certain oral diabetes medicines. Hypoglycemia can be a life-threatening condition. Symptoms of hypoglycemia, such as sleepiness, dizziness, and confusion, are similar to symptoms of having too much  alcohol.  Do not drink alcohol if: ? Your health care provider tells you not to drink. ? You are pregnant, may be pregnant, or are planning to become pregnant.  If you drink alcohol: ? Do not drink on an empty stomach. ? Limit how much you use to:  0-1 drink a day for women.  0-2 drinks a day for men. ? Be aware of how much alcohol is in your drink. In the [place] one drink equals one 12 oz bottle of beer (355 mL), one 5 oz glass of wine (148 mL), or one 1 oz glass of hard liquor (44 mL). ? Keep yourself hydrated with water, diet soda, or unsweetened iced tea.  Keep in mind that regular soda, juice, and other mixers may contain a lot of sugar and must be counted as carbs. What are tips for following this plan? Reading food labels  Start by checking the serving size on the "Nutrition Facts" label of packaged foods and drinks. The amount of calories, carbs, fats, and other nutrients listed on the label is based on one serving of the item. Many items contain more than one serving per package.  Check the total grams (g) of carbs in one serving. You can calculate the number of servings of carbs in one serving by dividing the total carbs by 15. For example, if a food has 30 g of total carbs per serving, it would be equal to 2 servings of carbs.  Check the number of grams (g) of saturated fats and trans fats in one serving. Choose foods that have   a low amount or none of these fats.  Check the number of milligrams (mg) of salt (sodium) in one serving. Most people should limit total sodium intake to less than 2,300 mg per day.  Always check the nutrition information of foods labeled as "low-fat" or "nonfat." These foods may be higher in added sugar or refined carbs and should be avoided.  Talk to your dietitian to identify your daily goals for nutrients listed on the label. Shopping  Avoid buying canned, pre-made, or processed foods. These foods tend to be high in fat, sodium, and added  sugar.  Shop around the outside edge of the grocery store. This is where you will most often find fresh fruits and vegetables, bulk grains, fresh meats, and fresh dairy. Cooking  Use low-heat cooking methods, such as baking, instead of high-heat cooking methods like deep frying.  Cook using healthy oils, such as olive, canola, or sunflower oil.  Avoid cooking with butter, cream, or high-fat meats. Meal planning  Eat meals and snacks regularly, preferably at the same times every day. Avoid going long periods of time without eating.  Eat foods that are high in fiber, such as fresh fruits, vegetables, beans, and whole grains. Talk with your dietitian about how many servings of carbs you can eat at each meal.  Eat 4-6 oz (112-168 g) of lean protein each day, such as lean meat, chicken, fish, eggs, or tofu. One ounce (oz) of lean protein is equal to: ? 1 oz (28 g) of meat, chicken, or fish. ? 1 egg. ?  cup (62 g) of tofu.  Eat some foods each day that contain healthy fats, such as avocado, nuts, seeds, and fish.   What foods should I eat? Fruits Berries. Apples. Oranges. Peaches. Apricots. Plums. Grapes. Mango. Papaya. Pomegranate. Kiwi. Cherries. Vegetables Lettuce. Spinach. Leafy greens, including kale, chard, collard greens, and mustard greens. Beets. Cauliflower. Cabbage. Broccoli. Carrots. Green beans. Tomatoes. Peppers. Onions. Cucumbers. Brussels sprouts. Grains Whole grains, such as whole-wheat or whole-grain bread, crackers, tortillas, cereal, and pasta. Unsweetened oatmeal. Quinoa. Brown or wild rice. Meats and other proteins Seafood. Poultry without skin. Lean cuts of poultry and beef. Tofu. Nuts. Seeds. Dairy Low-fat or fat-free dairy products such as milk, yogurt, and cheese. The items listed above may not be a complete list of foods and beverages you can eat. Contact a dietitian for more information. What foods should I avoid? Fruits Fruits canned with  syrup. Vegetables Canned vegetables. Frozen vegetables with butter or cream sauce. Grains Refined white flour and flour products such as bread, pasta, snack foods, and cereals. Avoid all processed foods. Meats and other proteins Fatty cuts of meat. Poultry with skin. Breaded or fried meats. Processed meat. Avoid saturated fats. Dairy Full-fat yogurt, cheese, or milk. Beverages Sweetened drinks, such as soda or iced tea. The items listed above may not be a complete list of foods and beverages you should avoid. Contact a dietitian for more information. Questions to ask a health care provider  Do I need to meet with a diabetes educator?  Do I need to meet with a dietitian?  What number can I call if I have questions?  When are the best times to check my blood glucose? Where to find more information:  American Diabetes Association: diabetes.org  Academy of Nutrition and Dietetics: www.eatright.org  National Institute of Diabetes and Digestive and Kidney Diseases: www.niddk.nih.gov  Association of Diabetes Care and Education Specialists: www.diabeteseducator.org Summary  It is important to have healthy eating   habits because your blood sugar (glucose) levels are greatly affected by what you eat and drink.  A healthy meal plan will help you control your blood glucose and maintain a healthy lifestyle.  Your health care provider may recommend that you work with a dietitian to make a meal plan that is best for you.  Keep in mind that carbohydrates (carbs) and alcohol have immediate effects on your blood glucose levels. It is important to count carbs and to use alcohol carefully. This information is not intended to replace advice given to you by your health care provider. Make sure you discuss any questions you have with your health care provider. Document Revised: 11/22/2019 Document Reviewed: 11/22/2019 Elsevier Patient Education  2021 Elsevier Inc.  

## 2021-09-28 ENCOUNTER — Other Ambulatory Visit: Payer: Self-pay | Admitting: Family

## 2021-09-28 ENCOUNTER — Other Ambulatory Visit: Payer: Self-pay | Admitting: Adult Health

## 2021-09-28 DIAGNOSIS — G8929 Other chronic pain: Secondary | ICD-10-CM

## 2021-09-28 DIAGNOSIS — E1159 Type 2 diabetes mellitus with other circulatory complications: Secondary | ICD-10-CM

## 2021-09-28 DIAGNOSIS — M5441 Lumbago with sciatica, right side: Secondary | ICD-10-CM

## 2021-09-28 DIAGNOSIS — I10 Essential (primary) hypertension: Secondary | ICD-10-CM

## 2021-10-09 ENCOUNTER — Ambulatory Visit (INDEPENDENT_AMBULATORY_CARE_PROVIDER_SITE_OTHER): Payer: Medicaid Other | Admitting: Neurology

## 2021-10-09 ENCOUNTER — Encounter: Payer: Medicaid Other | Admitting: Neurology

## 2021-10-09 DIAGNOSIS — G5603 Carpal tunnel syndrome, bilateral upper limbs: Secondary | ICD-10-CM | POA: Diagnosis not present

## 2021-10-09 DIAGNOSIS — Z0289 Encounter for other administrative examinations: Secondary | ICD-10-CM

## 2021-10-09 DIAGNOSIS — M5416 Radiculopathy, lumbar region: Secondary | ICD-10-CM

## 2021-10-09 DIAGNOSIS — G5622 Lesion of ulnar nerve, left upper limb: Secondary | ICD-10-CM

## 2021-10-09 NOTE — Procedures (Signed)
Full Name: Heidi Landry Gender: Female MRN #: [id number] Date of Birth: 1986-08-02    Visit Date: 10/09/2021 10:51 Age: 35 Years female,  History: 36 year old right-handed presented with few years history of progressive worsening bilateral hands paresthesia, subjective weakness, she denies neck pain,  Summary of the test: Nerve conduction study: Bilateral median, left ulnar sensory responses were absent.  Right ulnar sensory response was within normal limit.  Left sural, superficial peroneal sensory responses were normal.  Left peroneal to EDB, tibial motor responses were normal.  Right ulnar motor responses were normal.  Left ulnar motor responses showed mildly prolonged distal latency, with mildly decreased CMAP amplitude, moderately slowed conduction velocity.  Right median motor response was absent.  Left median motor response showed severely prolonged distal latency, with normal CMAP amplitude.  Electromyography. Selected needle examinations were performed at bilateral upper extremity muscles.  There was evidence of increased insertional activity, 1-2+ spontaneous activities at right abductor pollicis brevis, prolonged enlarged complex motor unit with decreased recruitment patterns.  There is also evidence of mildly decreased recruitment patterns at left first dorsal interossei, left abductor pollicis brevis, there was no spontaneous activities.    Conclusion: This is an abnormal study.  There is electrodiagnostic evidence of bilateral median neuropathy across the wrist, consistent with bilateral carpal tunnel syndromes.  Right side is severe, with evidence of axonal loss; left side is moderately severe, no evidence of axonal loss.  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    ------------------------------- Marcial Pacas, M.D. PhD  [facility] [address] [place], [place] [address] Tel: [phone] Fax: [phone]  Verbal informed consent was obtained from the patient, patient was informed of potential risk of procedure, including bruising, bleeding, hematoma formation, infection, muscle weakness, muscle pain, numbness, among others.  [place]    Nerve / Sites Muscle Latency Ref. Amplitude Ref. Rel Amp Segments Distance Velocity Ref. Area    ms ms mV mV %  cm m/s m/s mVms  L Median - APB     Wrist APB 9.5 ?4.4 8.4 ?4.0 100 Wrist - APB 7   23.1     Upper arm APB 13.5  7.7  91.7 Upper arm - Wrist 20 51 ?49 22.8  R Median - APB     Wrist APB NR ?4.4 NR ?4.0 NR Wrist - APB 7   NR     Upper arm APB NR  NR  NR Upper arm - Wrist 20 NR ?49 NR  L Ulnar - ADM     Wrist ADM 3.9 ?3.3 4.8 ?6.0 100 Wrist - ADM 7   12.3     B.Elbow ADM 9.5  2.3  48.6 B.Elbow - Wrist 17 31 ?49 8.3     A.Elbow ADM 12.0  2.6  113 A.Elbow - B.Elbow 8 31 ?49 11.3  R Ulnar - ADM     Wrist ADM 2.1 ?3.3 6.6 ?6.0 100 Wrist - ADM 7   18.7     B.Elbow ADM 5.2  3.7  55.5 B.Elbow - Wrist 18 58 ?49 8.9     A.Elbow ADM 7.5  3.7  101 A.Elbow - B.Elbow 12 52 ?49 8.2  L Peroneal - EDB     Ankle EDB 4.6 ?6.5 5.6 ?2.0 100 Ankle - EDB 9   19.1  Fib head EDB 9.9  5.1  90.7 Fib head - Ankle 24 45 ?44 18.5     Pop fossa EDB 12.2  4.9  95.3 Pop fossa - Fib head 10 44 ?44 17.8         Pop fossa - Ankle      L Tibial - AH     Ankle AH 5.5 ?5.8 6.7 ?4.0 100 Ankle - AH 9   12.8     Pop fossa AH 13.9  5.6  84 Pop fossa - Ankle 36 43 ?41 13.5                  SNC    Nerve / Sites Rec. Site Peak Lat Ref.  Amp Ref. Segments Distance    ms ms V V  cm  L Sural - Ankle (Calf)     Calf Ankle 3.8 ?4.4 13 ?6 Calf - Ankle 14  L Superficial peroneal - Ankle     Lat leg Ankle 3.4 ?4.4 12 ?6 Lat leg - Ankle 14  L Median - Orthodromic (Dig II, Mid palm)     Dig II Wrist NR ?3.4 NR ?10 Dig II - Wrist 13  R Median - Orthodromic  (Dig II, Mid palm)     Dig II Wrist NR ?3.4 NR ?10 Dig II - Wrist 13  L Ulnar - Orthodromic, (Dig V, Mid palm)     Dig V Wrist NR ?3.1 NR ?5 Dig V - Wrist 11  R Ulnar - Orthodromic, (Dig V, Mid palm)     Dig V Wrist 2.4 ?3.1 5 ?5 Dig V - Wrist 88                 F  Wave    Nerve F Lat Ref.   ms ms  L Tibial - AH 54.5 ?56.0  L Ulnar - ADM 34.7 ?32.0  R Ulnar - ADM 25.8 ?32.0           EMG Summary Table    Spontaneous MUAP Recruitment  Muscle IA Fib PSW Fasc Other Amp Dur. Poly Pattern  R. First dorsal interosseous Increased None None None _______ Normal Normal Normal Reduced  R. Abductor pollicis brevis Increased 2+ None None _______ Increased Increased 2+ Reduced  R. Triceps brachii Normal None None None _______ Normal Normal Normal Normal  R. Deltoid Normal None None None _______ Normal Normal Normal Normal  R. Biceps brachii Normal None None None _______ Normal Normal Normal Normal  R. Pronator teres Normal None None None _______ Normal Normal Normal Normal  R. Abductor digiti minimi (manus) Increased None None None _______ Normal Normal Normal Normal  R. Flexor digitorum profundus (Ulnar) Normal None None None _______ Normal Normal Normal Normal  L. Abductor pollicis brevis Increased None None None _______ Increased Increased 1+ Reduced  L. Triceps brachii Normal None None None _______ Normal Normal Normal Normal  L. Deltoid Normal None None None _______ Normal Normal Normal Normal  L. Biceps brachii Normal None None None _______ Normal Normal Normal Normal  L. First dorsal interosseous Normal None None None _______ Normal Normal Normal Reduced  L Flexor digitorum profundus (Ulnar) Normal None None None ______ Normal Normal Normal Reduced

## 2021-10-12 ENCOUNTER — Other Ambulatory Visit: Payer: Self-pay | Admitting: Adult Health

## 2021-10-14 ENCOUNTER — Telehealth: Payer: Self-pay

## 2021-10-14 DIAGNOSIS — G5603 Carpal tunnel syndrome, bilateral upper limbs: Secondary | ICD-10-CM

## 2021-10-14 NOTE — Telephone Encounter (Signed)
-----   Message from Star Age, MD sent at 10/09/2021  4:47 PM EDT ----- Please call patient and advise her that she does have evidence of carpal tunnel syndrome on both sides, right side severe, left side moderate.  We did the EMG nerve conduction velocity test in our office today.  She also has evidence of nerve compression in the left elbow which is something she was told before.  I would recommend that she make a follow-up appointment with her hand surgeon.  If she needs a referral, she can let us know and I would be happy to facilitate a new referral.

## 2021-10-14 NOTE — Telephone Encounter (Signed)
I called patient. I discussed her NCV/EMG results and recommendations. She has seen a Copy at [facility] before but needs another referral. I will place this referral. Patient verbalized understanding of results and recommendations. Pt had no questions at this time but was encouraged to call back if questions arise.

## 2021-10-15 NOTE — Telephone Encounter (Signed)
Patient returned call, she states she has see Dr. Amedeo Plenty @ [facility] in the past. I will send the referral there. Phone: [phone].

## 2021-10-15 NOTE — Telephone Encounter (Signed)
I called patient to clarify. There are a few different places that could be referred to as [facility]. LVM asking her to return my call.

## 2021-10-28 ENCOUNTER — Telehealth: Payer: Self-pay | Admitting: Family

## 2021-10-28 NOTE — Telephone Encounter (Signed)
Called - appt made for tomorrow

## 2021-10-28 NOTE — Telephone Encounter (Signed)
Pt called stating that she is out of both of her depression meds (Bupropion and Venlafaxin) that were last prescribed by outside provider.  Pt aware that she needs an appt but is requesting one ASAP so she can get refills because she says she feels like she is going crazy.  Please advise and call patient.

## 2021-10-29 ENCOUNTER — Encounter: Payer: Self-pay | Admitting: Family

## 2021-10-29 ENCOUNTER — Other Ambulatory Visit: Payer: Self-pay

## 2021-10-29 ENCOUNTER — Ambulatory Visit: Payer: Medicaid Other | Admitting: Family

## 2021-10-29 VITALS — BP 129/81 | HR 97 | Temp 98.0°F | Ht 61.0 in | Wt 318.0 lb

## 2021-10-29 DIAGNOSIS — E1159 Type 2 diabetes mellitus with other circulatory complications: Secondary | ICD-10-CM

## 2021-10-29 DIAGNOSIS — K7581 Nonalcoholic steatohepatitis (NASH): Secondary | ICD-10-CM

## 2021-10-29 DIAGNOSIS — J208 Acute bronchitis due to other specified organisms: Secondary | ICD-10-CM

## 2021-10-29 DIAGNOSIS — I152 Hypertension secondary to endocrine disorders: Secondary | ICD-10-CM

## 2021-10-29 DIAGNOSIS — Z6841 Body Mass Index (BMI) 40.0 and over, adult: Secondary | ICD-10-CM

## 2021-10-29 DIAGNOSIS — K219 Gastro-esophageal reflux disease without esophagitis: Secondary | ICD-10-CM

## 2021-10-29 DIAGNOSIS — E785 Hyperlipidemia, unspecified: Secondary | ICD-10-CM

## 2021-10-29 DIAGNOSIS — F339 Major depressive disorder, recurrent, unspecified: Secondary | ICD-10-CM

## 2021-10-29 DIAGNOSIS — F17200 Nicotine dependence, unspecified, uncomplicated: Secondary | ICD-10-CM

## 2021-10-29 DIAGNOSIS — E1169 Type 2 diabetes mellitus with other specified complication: Secondary | ICD-10-CM

## 2021-10-29 DIAGNOSIS — E781 Pure hyperglyceridemia: Secondary | ICD-10-CM

## 2021-10-29 DIAGNOSIS — F411 Generalized anxiety disorder: Secondary | ICD-10-CM

## 2021-10-29 DIAGNOSIS — B9689 Other specified bacterial agents as the cause of diseases classified elsewhere: Secondary | ICD-10-CM

## 2021-10-29 MED ORDER — VENLAFAXINE HCL ER 75 MG PO CP24: 75.0000 mg | ORAL_CAPSULE | Freq: Every day | ORAL | 2 refills | Status: DC

## 2021-10-29 MED ORDER — DOXYCYCLINE HYCLATE 100 MG PO TABS: 100.0000 mg | ORAL_TABLET | Freq: Two times a day (BID) | ORAL | 0 refills | Status: DC

## 2021-10-29 MED ORDER — BUPROPION HCL ER (XL) 300 MG PO TB24: 300.0000 mg | ORAL_TABLET | Freq: Every morning | ORAL | 1 refills | Status: DC

## 2021-10-29 NOTE — Patient Instructions (Signed)
Major Depressive Disorder, Adult Major depressive disorder (MDD) is a mental health condition. It may also be called clinical depression or unipolar depression. MDD causes symptoms of sadness, hopelessness, and loss of interest in things. These symptoms last most of the day, almost every day, for 2 weeks. MDD can also cause physical symptoms. It can interfere with relationships and with everyday activities,such as work, school, and activities that are usually pleasant. MDD may be mild, moderate, or severe. It may be single-episode MDD, whichhappens once, or recurrent MDD, which may occur multiple times. What are the causes? The exact cause of this condition is not known. MDD is most likely caused by a combination of things, which may include: Your personality traits. Learned or conditioned behaviors or thoughts or feelings that reinforce negativity. Any alcohol or substance misuse. Long-term (chronic) physical or mental health illness. Going through a traumatic experience or major life changes. What increases the risk? The following factors may make someone more likely to develop MDD: A family history of depression. Being a woman. Troubled family relationships. Abnormally low levels of certain brain chemicals. Traumatic or painful events in childhood, especially abuse or loss of a parent. A lot of stress from life experiences, such as poor living conditions or discrimination. Chronic physical illness or other mental health disorders. What are the signs or symptoms? The main symptoms of MDD usually include: Constant depressed or irritable mood. A loss of interest in things and activities. Other symptoms include: Sleeping or eating too much or too little. Unexplained weight gain or weight loss. Tiredness or low energy. Being agitated, restless, or weak. Feeling hopeless, worthless, or guilty. Trouble thinking clearly or making decisions. Thoughts of suicide or thoughts of harming  others. Isolating oneself or avoiding other people or activities. Trouble completing tasks, work, or any normal obligations. Severe symptoms of this condition may include: Psychotic depression.This may include false beliefs, or delusions. It may also include seeing, hearing, tasting, smelling, or feeling things that are not real (hallucinations). Chronic depression or persistent depressive disorder. This is low-level depression that lasts for at least 2 years. Melancholic depression, or feeling extremely sad and hopeless. Catatonic depression, which includes trouble speaking and trouble moving. How is this diagnosed? This condition may be diagnosed based on: Your symptoms. Your medical and mental health history. You may be asked questions about your lifestyle, including any drug and alcohol use. A physical exam. Blood tests to rule out other conditions. MDD is confirmed if you have the following symptoms most of the day, nearly every day, in a 2-week period: Either a depressed mood or loss of interest. At least four other MDD symptoms. How is this treated? This condition is usually treated by mental health professionals, such as psychologists, psychiatrists, and clinical social workers. You may need more than one type of treatment. Treatment may include: Psychotherapy, also called talk therapy or counseling. Types of psychotherapy include: Cognitive behavioral therapy (CBT). This teaches you to recognize unhealthy feelings, thoughts, and behaviors, and replace them with positive thoughts and actions. Interpersonal therapy (IPT). This helps you to improve the way you communicate with others or relate to them. Family therapy. This treatment includes members of your family. Medicines to treat anxiety and depression. These medicines help to balance the brain chemicals that affect your emotions. Lifestyle changes. You may be asked to: Limit alcohol use and avoid drug use. Get regular  exercise. Get plenty of sleep. Make healthy eating choices. Spend more time outdoors. Brain stimulation. This may be done   if symptoms are very severe and other treatments have not worked. Examples of this treatment are electroconvulsive therapy and transcranial magnetic stimulation. Follow these instructions at home: Activity Exercise regularly and spend time outdoors. Find activities that you enjoy doing, and make time to do them. Find healthy ways to manage stress, such as: Meditation or deep breathing. Spending time in nature. Journaling. Return to your normal activities as told by your health care provider. Ask your health care provider what activities are safe for you. Alcohol and drug use If you drink alcohol: Limit how much you use to: 0-1 drink a day for women who are not pregnant. 0-2 drinks a day for men. Be aware of how much alcohol is in your drink. In the [place] one drink equals one 12 oz bottle of beer (355 mL), one 5 oz glass of wine (148 mL), or one 1 oz glass of hard liquor (44 mL). Discuss your alcohol use with your health care provider. Alcohol can affect any antidepressant medicines you are taking. Discuss any drug use with your health care provider. General instructions  Take over-the-counter and prescription medicines only as told by your health care provider. Eat a healthy diet and get plenty of sleep. Consider joining a support group. Your health care provider may be able to recommend one. Keep all follow-up visits as told by your health care provider. This is important.  Where to find more information National Alliance on Mental Illness: www.nami.org [place] National Institute of Mental Health: www.nimh.nih.gov Contact a health care provider if: Your symptoms get worse. You develop new symptoms. Get help right away if: You self-harm. You have serious thoughts about hurting yourself or others. You hallucinate. If you ever feel like you may hurt yourself or  others, or have thoughts about taking your own life, get help right away. Go to your nearest emergency department or: Call your local emergency services (911 in the [place] Call a suicide crisis helpline, such as the National Suicide Prevention Lifeline at 1-800-273-8255. This is open 24 hours a day in the [place] Text the Crisis Text Line at 741741 (in the [place] Summary Major depressive disorder (MDD) is a mental health condition. MDD causes symptoms of sadness, hopelessness, and loss of interest in things. These symptoms last most of the day, almost every day, for 2 weeks. The symptoms of MDD can interfere with relationships and with everyday activities. Treatments and support are available for people who develop MDD. You may need more than one type of treatment. Get help right away if you have serious thoughts about hurting yourself or others. This information is not intended to replace advice given to you by your health care provider. Make sure you discuss any questions you have with your healthcare provider. Document Revised: 11/26/2019 Document Reviewed: 11/26/2019 Elsevier Patient Education  2022 Elsevier Inc.  

## 2021-10-29 NOTE — Progress Notes (Signed)
Subjective:    Patient ID: Heidi Landry, female    DOB: [birth year], 35 y.o.   MRN: [id number]  Chief Complaint  Patient presents with   Depression    Patient is crying a lot    Cough    Runny nose  x 2 weeks patient states she is weezing    PT presents to the office today for chronic follow up. She is followed by GI every 3 months for NASH. She is followed by [facility] for GAD and Depression.   She has a bariatric weight loss appointment.    She is also complaining of generalized nerve pain in feet, legs, buttocks, chest, and arms of a 8 out 10. She reports she has seen a neurologists in the past and has a follow up scheduled.    Depression        This is a chronic problem.  The current episode started more than 1 year ago.   The onset quality is gradual.   The problem occurs rarely.  Associated symptoms include fatigue, helplessness, hopelessness, restlessness, myalgias, headaches and sad.  Treatments tried: has been off her medications.  Past medical history includes anxiety.   Cough This is a new problem. The current episode started 1 to 4 weeks ago. The problem has been gradually worsening. The problem occurs every few minutes. The cough is Productive of sputum. Associated symptoms include ear pain, headaches, heartburn, myalgias, nasal congestion, a sore throat, shortness of breath and wheezing. Pertinent negatives include no chills, ear congestion or fever. The symptoms are aggravated by lying down. Risk factors for lung disease include smoking/tobacco exposure. She has tried rest and OTC cough suppressant for the symptoms.  Hypertension This is a chronic problem. The current episode started more than 1 year ago. The problem has been resolved since onset. The problem is controlled. Associated symptoms include anxiety, headaches, malaise/fatigue and shortness of breath. Pertinent negatives include no blurred vision or peripheral edema. Risk factors for coronary artery disease  include dyslipidemia, obesity and sedentary lifestyle. The current treatment provides moderate improvement.  Gastroesophageal Reflux She complains of belching, coughing, heartburn, a sore throat and wheezing. This is a chronic problem. The current episode started more than 1 year ago. The problem occurs occasionally. Associated symptoms include fatigue. Risk factors include obesity. She has tried a PPI for the symptoms.  Diabetes She presents for her follow-up diabetic visit. She has type 2 diabetes mellitus. Hypoglycemia symptoms include headaches and nervousness/anxiousness. Associated symptoms include fatigue. Pertinent negatives for diabetes include no blurred vision and no foot paresthesias. Symptoms are stable. Diabetic complications include heart disease. Risk factors for coronary artery disease include dyslipidemia, diabetes mellitus, hypertension, sedentary lifestyle and post-menopausal. (Does not check BS at home) Eye exam is current.  Hyperlipidemia This is a chronic problem. The current episode started more than 1 year ago. Associated symptoms include myalgias and shortness of breath. Current antihyperlipidemic treatment includes statins. The current treatment provides moderate improvement of lipids.  Anxiety Presents for follow-up visit. Symptoms include excessive worry, nervous/anxious behavior, restlessness and shortness of breath.    Nicotine Dependence Presents for follow-up visit. Symptoms include fatigue and sore throat. Her urge triggers include company of smokers. The symptoms have been stable. She smokes 1 pack of cigarettes per day.  OSA Using CPAP at night. Stable.    Review of Systems  Constitutional:  Positive for fatigue and malaise/fatigue. Negative for chills and fever.  HENT:  Positive for ear pain and sore throat.  Eyes:  Negative for blurred vision.  Respiratory:  Positive for cough, shortness of breath and wheezing.   Gastrointestinal:  Positive for heartburn.   Musculoskeletal:  Positive for myalgias.  Neurological:  Positive for headaches.  Psychiatric/Behavioral:  Positive for depression. The patient is nervous/anxious.   All other systems reviewed and are negative.     Objective:   Physical Exam Vitals reviewed.  Constitutional:      General: She is not in acute distress.    Appearance: She is well-developed. She is obese.  HENT:     Head: Normocephalic and atraumatic.     Right Ear: Tympanic membrane normal.     Left Ear: Tympanic membrane normal.  Eyes:     Pupils: Pupils are equal, round, and reactive to light.  Neck:     Thyroid: No thyromegaly.  Cardiovascular:     Rate and Rhythm: Normal rate and regular rhythm.     Heart sounds: Normal heart sounds. No murmur heard. Pulmonary:     Effort: Pulmonary effort is normal. No respiratory distress.     Breath sounds: Normal breath sounds. No wheezing.  Abdominal:     General: Bowel sounds are normal. There is no distension.     Palpations: Abdomen is soft.     Tenderness: There is no abdominal tenderness.  Musculoskeletal:        General: No tenderness. Normal range of motion.     Cervical back: Normal range of motion and neck supple.  Skin:    General: Skin is warm and dry.  Neurological:     Mental Status: She is alert and oriented to person, place, and time.     Cranial Nerves: No cranial nerve deficit.     Deep Tendon Reflexes: Reflexes are normal and symmetric.  Psychiatric:        Behavior: Behavior normal.        Thought Content: Thought content normal.        Judgment: Judgment normal.      BP 129/81   Pulse 97   Temp 98 F (36.7 C) (Temporal)   Ht _0  (1.549 m)   Wt (!) 318 lb (144.2 kg)   BMI 60.09 kg/m      Assessment & Plan:  Heidi Landry comes in today with chief complaint of Depression (Patient is crying a lot ) and Cough (Runny nose  x 2 weeks patient states she is weezing )   Diagnosis and orders addressed:  1. Hypertension associated  with type 2 diabetes mellitus ([place]) - CMP14+EGFR - CBC with Differential/Platelet  2. NASH (nonalcoholic steatohepatitis) - CMP14+EGFR - CBC with Differential/Platelet  3. 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 Hyperlipidemia associated with type 2 diabetes mellitus (HCC) - CMP14+EGFR - CBC with Differential/Platelet  5. GERD without esophagitis - CMP14+EGFR - CBC with Differential/Platelet  6. Depression, recurrent (HCC) Restart Effexor and Wellbutrin - venlafaxine XR (EFFEXOR-XR) 75 MG 24 hr capsule; Take 1 capsule (75 mg total) by mouth daily.  Dispense: 90 capsule; Refill: 2 - buPROPion (WELLBUTRIN XL) 300 MG 24 hr tablet; Take 1 tablet (300 mg total) by mouth every morning.  Dispense: 90 tablet; Refill: 1 - CMP14+EGFR - CBC with Differential/Platelet  7. Morbid obesity with BMI of 60.0-69.9, adult (HCC) - CMP14+EGFR - CBC with Differential/Platelet  8. Smoker - CMP14+EGFR - CBC with Differential/Platelet  9. GAD (generalized anxiety disorder) - venlafaxine XR (EFFEXOR-XR) 75 MG 24 hr capsule; Take 1  capsule (75 mg total) by mouth daily.  Dispense: 90 capsule; Refill: 2 - buPROPion (WELLBUTRIN XL) 300 MG 24 hr tablet; Take 1 tablet (300 mg total) by mouth every morning.  Dispense: 90 tablet; Refill: 1  10. Acute bacterial bronchitis - Take meds as prescribed - Use a cool mist humidifier  -Use saline nose sprays frequently -Force fluids -For any cough or congestion  Use plain Mucinex- regular strength or max strength is fine -For fever or aces or pains- take tylenol or ibuprofen. -Throat lozenges if help -RTO if symptoms worsen or do not improve - doxycycline (VIBRA-TABS) 100 MG tablet; Take 1 tablet (100 mg total) by mouth 2 (two) times daily.  Dispense: 20 tablet; Refill: 0   Labs pending Health Maintenance reviewed Diet and exercise encouraged  Follow up plan: 3 months    Evelina Dun, FNP

## 2021-10-30 LAB — CBC WITH DIFFERENTIAL/PLATELET
Basophils Absolute: 0.1 10*3/uL (ref 0.0–0.2)
Basos: 0 %
EOS (ABSOLUTE): 0.2 10*3/uL (ref 0.0–0.4)
Eos: 1 %
Hematocrit: 45.8 % (ref 34.0–46.6)
Hemoglobin: 14 g/dL (ref 11.1–15.9)
Immature Grans (Abs): 0.1 10*3/uL (ref 0.0–0.1)
Immature Granulocytes: 1 %
Lymphocytes Absolute: 3.4 10*3/uL — ABNORMAL HIGH (ref 0.7–3.1)
Lymphs: 25 %
MCH: 25.2 pg — ABNORMAL LOW (ref 26.6–33.0)
MCHC: 30.6 g/dL — ABNORMAL LOW (ref 31.5–35.7)
MCV: 83 fL (ref 79–97)
Monocytes Absolute: 0.6 10*3/uL (ref 0.1–0.9)
Monocytes: 5 %
Neutrophils Absolute: 9.3 10*3/uL — ABNORMAL HIGH (ref 1.4–7.0)
Neutrophils: 68 %
Platelets: 226 10*3/uL (ref 150–450)
RBC: 5.55 x10E6/uL — ABNORMAL HIGH (ref 3.77–5.28)
RDW: 16 % — ABNORMAL HIGH (ref 11.7–15.4)
WBC: 13.6 10*3/uL — ABNORMAL HIGH (ref 3.4–10.8)

## 2021-10-30 LAB — CMP14+EGFR
ALT: 30 IU/L (ref 0–32)
AST: 25 IU/L (ref 0–40)
Albumin/Globulin Ratio: 1.5 (ref 1.2–2.2)
Albumin: 4.4 g/dL (ref 3.8–4.8)
Alkaline Phosphatase: 133 IU/L — ABNORMAL HIGH (ref 44–121)
BUN/Creatinine Ratio: 18 (ref 9–23)
BUN: 13 mg/dL (ref 6–20)
Bilirubin Total: 0.4 mg/dL (ref 0.0–1.2)
CO2: 27 mmol/L (ref 20–29)
Calcium: 9.4 mg/dL (ref 8.7–10.2)
Chloride: 99 mmol/L (ref 96–106)
Creatinine, Ser: 0.72 mg/dL (ref 0.57–1.00)
Globulin, Total: 3 g/dL (ref 1.5–4.5)
Glucose: 127 mg/dL — ABNORMAL HIGH (ref 70–99)
Potassium: 4.4 mmol/L (ref 3.5–5.2)
Sodium: 143 mmol/L (ref 134–144)
Total Protein: 7.4 g/dL (ref 6.0–8.5)
eGFR: 111 mL/min/{1.73_m2} (ref >59)

## 2021-11-02 ENCOUNTER — Other Ambulatory Visit: Payer: Self-pay | Admitting: Family

## 2021-11-09 ENCOUNTER — Other Ambulatory Visit: Payer: Self-pay | Admitting: Family

## 2021-11-09 DIAGNOSIS — E119 Type 2 diabetes mellitus without complications: Secondary | ICD-10-CM

## 2021-11-09 DIAGNOSIS — E1169 Type 2 diabetes mellitus with other specified complication: Secondary | ICD-10-CM

## 2021-11-09 DIAGNOSIS — E781 Pure hyperglyceridemia: Secondary | ICD-10-CM

## 2021-11-09 DIAGNOSIS — M5442 Lumbago with sciatica, left side: Secondary | ICD-10-CM

## 2021-11-09 DIAGNOSIS — G8929 Other chronic pain: Secondary | ICD-10-CM

## 2021-11-13 ENCOUNTER — Other Ambulatory Visit: Payer: Self-pay | Admitting: Adult Health

## 2021-11-30 ENCOUNTER — Other Ambulatory Visit: Payer: Self-pay | Admitting: Family

## 2021-12-14 ENCOUNTER — Other Ambulatory Visit: Payer: Self-pay | Admitting: Family

## 2021-12-16 ENCOUNTER — Ambulatory Visit: Payer: Medicaid Other | Admitting: Family

## 2021-12-16 ENCOUNTER — Encounter: Payer: Self-pay | Admitting: Family

## 2021-12-16 VITALS — BP 126/75 | HR 92 | Temp 98.7°F | Ht 61.0 in | Wt 320.8 lb

## 2021-12-16 DIAGNOSIS — F17200 Nicotine dependence, unspecified, uncomplicated: Secondary | ICD-10-CM | POA: Diagnosis not present

## 2021-12-16 DIAGNOSIS — J209 Acute bronchitis, unspecified: Secondary | ICD-10-CM | POA: Diagnosis not present

## 2021-12-16 MED ORDER — PREDNISONE 10 MG (21) PO TBPK: ORAL_TABLET | ORAL | 0 refills | Status: DC

## 2021-12-16 MED ORDER — AMOXICILLIN-POT CLAVULANATE 875-125 MG PO TABS: 1.0000 | ORAL_TABLET | Freq: Two times a day (BID) | ORAL | 0 refills | Status: DC

## 2021-12-16 MED ORDER — GABAPENTIN 300 MG PO CAPS: 300.0000 mg | ORAL_CAPSULE | Freq: Three times a day (TID) | ORAL | 3 refills | Status: DC

## 2021-12-16 NOTE — Patient Instructions (Signed)
Acute Bronchitis, Adult °Acute bronchitis is sudden inflammation of the main airways (bronchi) that come off the windpipe (trachea) in the lungs. The swelling causes the airways to get smaller and make more mucus than normal. This can make it hard to breathe and can cause coughing or noisy breathing (wheezing). °Acute bronchitis may last several weeks. The cough may last longer. Allergies, asthma, and exposure to smoke may make the condition worse. °What are the causes? °This condition can be caused by germs and by substances that irritate the lungs, including: °Cold and flu viruses. The most common cause of this condition is the virus that causes the common cold. °Bacteria. This is less common. °Breathing in substances that irritate the lungs, including: °Smoke from cigarettes and other forms of tobacco. °Dust and pollen. °Fumes from household cleaning products, gases, or burned fuel. °Indoor or outdoor air pollution. °What increases the risk? °The following factors may make you more likely to develop this condition: °A weak body's defense system, also called the immune system. °A condition that affects your lungs and breathing, such as asthma. °What are the signs or symptoms? °Common symptoms of this condition include: °Coughing. This may bring up clear, yellow, or green mucus from your lungs (sputum). °Wheezing. °Runny or stuffy nose. °Having too much mucus in your lungs (chest congestion). °Shortness of breath. °Aches and pains, including sore throat or chest. °How is this diagnosed? °This condition is usually diagnosed based on: °Your symptoms and medical history. °A physical exam. °You may also have other tests, including tests to rule out other conditions, such as pneumonia. These tests include: °A test of lung function. °Test of a mucus sample to look for the presence of bacteria. °Tests to check the oxygen level in your blood. °Blood tests. °Chest X-ray. °How is this treated? °Most cases of acute bronchitis  clear up over time without treatment. Your health care provider may recommend: °Drinking more fluids to help thin your mucus so it is easier to cough up. °Taking inhaled medicine (inhaler) to improve air flow in and out of your lungs. °Using a vaporizer or a humidifier. These are machines that add water to the air to help you breathe better. °Taking a medicine that thins mucus and clears congestion (expectorant). °Taking a medicine that prevents or stops coughing (cough suppressant). °It is notcommon to take an antibiotic medicine for this condition. °Follow these instructions at home: ° °Take over-the-counter and prescription medicines only as told by your health care provider. °Use an inhaler, vaporizer, or humidifier as told by your health care provider. °Take two teaspoons (10 mL) of honey at bedtime to lessen coughing at night. °Drink enough fluid to keep your urine pale yellow. °Do not use any products that contain nicotine or tobacco. These products include cigarettes, chewing tobacco, and vaping devices, such as e-cigarettes. If you need help quitting, ask your health care provider. °Get plenty of rest. °Return to your normal activities as told by your health care provider. Ask your health care provider what activities are safe for you. °Keep all follow-up visits. This is important. °How is this prevented? °To lower your risk of getting this condition again: °Wash your hands often with soap and water for at least 20 seconds. If soap and water are not available, use hand sanitizer. °Avoid contact with people who have cold symptoms. °Try not to touch your mouth, nose, or eyes with your hands. °Avoid breathing in smoke or chemical fumes. Breathing smoke or chemical fumes will make your condition   worse. °Get the flu shot every year. °Contact a health care provider if: °Your symptoms do not improve after 2 weeks. °You have trouble coughing up the mucus. °Your cough keeps you awake at night. °You have a  fever. °Get help right away if you: °Cough up blood. °Feel pain in your chest. °Have severe shortness of breath. °Faint or keep feeling like you are going to faint. °Have a severe headache. °Have a fever or chills that get worse. °These symptoms may represent a serious problem that is an emergency. Do not wait to see if the symptoms will go away. Get medical help right away. Call your local emergency services (911 in the [place] Do not drive yourself to the hospital. °Summary °Acute bronchitis is inflammation of the main airways (bronchi) that come off the windpipe (trachea) in the lungs. The swelling causes the airways to get smaller and make more mucus than normal. °Drinking more fluids can help thin your mucus so it is easier to cough up. °Take over-the-counter and prescription medicines only as told by your health care provider. °Do not use any products that contain nicotine or tobacco. These products include cigarettes, chewing tobacco, and vaping devices, such as e-cigarettes. If you need help quitting, ask your health care provider. °Contact a health care provider if your symptoms do not improve after 2 weeks. °This information is not intended to replace advice given to you by your health care provider. Make sure you discuss any questions you have with your health care provider. °Document Revised: 04/17/2021 Document Reviewed: 04/17/2021 °Elsevier Patient Education © 2022 [employer] ° °

## 2021-12-16 NOTE — Addendum Note (Signed)
Addended by: Evelina Dun A on: 12/16/2021 02:53 PM   Modules accepted: Orders

## 2021-12-16 NOTE — Progress Notes (Signed)
Subjective:    Patient ID: Heidi Landry, female    DOB: [birth year], 35 y.o.   MRN: [id number]  Chief Complaint  Patient presents with   Cough   Nasal Congestion    Since last Friday. Its thick and yellow mucus    Pt presents to the office today with cough. She has taken home test for COVID that was negative.  Cough This is a new problem. The current episode started in the past 7 days. The problem has been waxing and waning. The problem occurs every few minutes. The cough is Non-productive. Associated symptoms include ear congestion, ear pain, headaches, nasal congestion, postnasal drip, a sore throat and wheezing. Pertinent negatives include no chills, fever, myalgias or shortness of breath. Risk factors for lung disease include smoking/tobacco exposure. She has tried rest and OTC cough suppressant for the symptoms. The treatment provided mild relief.     Review of Systems  Constitutional:  Negative for chills and fever.  HENT:  Positive for ear pain, postnasal drip and sore throat.   Respiratory:  Positive for cough and wheezing. Negative for shortness of breath.   Musculoskeletal:  Negative for myalgias.  Neurological:  Positive for headaches.  All other systems reviewed and are negative.     Objective:   Physical Exam Vitals reviewed.  Constitutional:      General: She is not in acute distress.    Appearance: She is well-developed. She is obese.  HENT:     Head: Normocephalic and atraumatic.     Right Ear: Tympanic membrane normal.     Left Ear: Tympanic membrane normal.     Nose:     Right Turbinates: Swollen.     Left Turbinates: Swollen.     Mouth/Throat:     Pharynx: Posterior oropharyngeal erythema present.  Eyes:     Pupils: Pupils are equal, round, and reactive to light.  Neck:     Thyroid: No thyromegaly.  Cardiovascular:     Rate and Rhythm: Normal rate and regular rhythm.     Heart sounds: Normal heart sounds. No murmur heard. Pulmonary:     Effort:  Pulmonary effort is normal. No respiratory distress.     Breath sounds: Normal breath sounds. No wheezing.  Abdominal:     General: Bowel sounds are normal. There is no distension.     Palpations: Abdomen is soft.     Tenderness: There is no abdominal tenderness.  Musculoskeletal:        General: No tenderness. Normal range of motion.     Cervical back: Normal range of motion and neck supple.  Skin:    General: Skin is warm and dry.  Neurological:     Mental Status: She is alert and oriented to person, place, and time.     Cranial Nerves: No cranial nerve deficit.     Deep Tendon Reflexes: Reflexes are normal and symmetric.  Psychiatric:        Behavior: Behavior normal.        Thought Content: Thought content normal.        Judgment: Judgment normal.         BP 126/75    Pulse 92    Temp 98.7 F (37.1 C) (Temporal)    Ht 5' 1"  (1.549 m)    Wt (!) 320 lb 12.8 oz (145.5 kg)    BMI 60.61 kg/m   Assessment & Plan:  Heidi Landry comes in today with chief complaint of Cough and Nasal Congestion (  Since last Friday. Its thick and yellow mucus )   Diagnosis and orders addressed:  1. Acute bronchitis, unspecified organism - Take meds as prescribed - Use a cool mist humidifier  -Use saline nose sprays frequently -Force fluids -For any cough or congestion  Use plain Mucinex- regular strength or max strength is fine -For fever or aces or pains- take tylenol or ibuprofen. -Throat lozenges if help -Follow up if symptoms worsen or do not improve  - predniSONE (STERAPRED UNI-PAK 21 TAB) 10 MG (21) TBPK tablet; Use as directed  Dispense: 21 tablet; Refill: 0  2. Current smoker - predniSONE (STERAPRED UNI-PAK 21 TAB) 10 MG (21) TBPK tablet; Use as directed  Dispense: 21 tablet; Refill: 0    Evelina Dun, FNP

## 2021-12-17 ENCOUNTER — Ambulatory Visit: Payer: Medicaid Other | Admitting: Pulmonary Disease

## 2021-12-17 ENCOUNTER — Other Ambulatory Visit: Payer: Self-pay

## 2021-12-17 ENCOUNTER — Telehealth: Payer: Self-pay

## 2021-12-17 ENCOUNTER — Encounter: Payer: Self-pay | Admitting: Pulmonary Disease

## 2021-12-17 VITALS — BP 136/84 | HR 74 | Temp 98.2°F | Ht 62.0 in | Wt 323.0 lb

## 2021-12-17 DIAGNOSIS — Z720 Tobacco use: Secondary | ICD-10-CM

## 2021-12-17 DIAGNOSIS — G4730 Sleep apnea, unspecified: Secondary | ICD-10-CM

## 2021-12-17 DIAGNOSIS — J9801 Acute bronchospasm: Secondary | ICD-10-CM

## 2021-12-17 DIAGNOSIS — E669 Obesity, unspecified: Secondary | ICD-10-CM

## 2021-12-17 DIAGNOSIS — F411 Generalized anxiety disorder: Secondary | ICD-10-CM

## 2021-12-17 DIAGNOSIS — F339 Major depressive disorder, recurrent, unspecified: Secondary | ICD-10-CM

## 2021-12-17 MED ORDER — ALBUTEROL SULFATE (2.5 MG/3ML) 0.083% IN NEBU: 2.5000 mg | INHALATION_SOLUTION | Freq: Four times a day (QID) | RESPIRATORY_TRACT | 5 refills | Status: AC | PRN

## 2021-12-17 NOTE — Patient Instructions (Signed)
Will call with results of CPAP download and make adjustments to set up as needed  Will arrange for home nebulizer machine and you can use albuterol every 6 hours as needed for cough, wheeze, chest congestion, or shortness of breath  Follow up in 1 year

## 2021-12-17 NOTE — Telephone Encounter (Signed)
Heidi Landry Key: [id number] help? Call us at [phone] Outcome Additional Information Required Prior Authorization is not required at this time. Pharmacy needs to submit override codes for Drug Utilization Review. Drug Venlafaxine HCl ER 75MG er capsules Form [facility] Electronic [place] Form [phone] NCPDP)

## 2021-12-17 NOTE — Telephone Encounter (Signed)
Called [facility] in [place]. Spoke with Nicole Kindred. He was unable to pull a compliance report on pt.

## 2021-12-17 NOTE — Telephone Encounter (Signed)
Heidi Landry Key: [id number] help? Call us at [phone] Outcome Additional Information Required Prior Authorization is not required at this time. Pharmacy needs to submit override codes for Drug Utilization Review. Drug Venlafaxine HCl ER 75MG er capsules Form [facility] Electronic [place] Form [phone] NCPDP)

## 2021-12-17 NOTE — Progress Notes (Signed)
[facility]  Chief Complaint  Patient presents with   Follow-up    Feels CPAP is working well but hasnt been able to use machine while shes been sick over the  last few days.   Pt forgot her SD Card.     Constitutional:  BP 136/84    Pulse 74    Temp 98.2 F (36.8 C)    Ht 5' 2" (1.575 m)    Wt (!) 323 lb 0.6 oz (146.5 kg)    SpO2 98%    BMI 59.08 kg/m   Past Medical History:  DM type 2, HTN, Fatty liver, GERD, Adrenal adenoma, Anxiety, A fib, Carpal tunnel, Back pain, IBS, Depression  Past Surgical History:  She  has a past surgical history that includes Cholecystectomy; Cesarean section (N/A, 12/08/2015); and Knee surgery (Right, 01/29/2019).  Brief Summary:  Heidi Landry is a 35 y.o. female smoker with obstructive sleep apnea.        Subjective:   She has  been using CPAP nightly until she got a sinus infection a few days ago.  This is better and she started using CPAP again last night.  Has nasal mask.  Feels like pressure might be running too low at times.   She is still smoking.  Hasn't tried nicotine patch yet.  She gets episodes of cough and wheeze, especially when she has a cold.  She has used a nebulizer before and this helped.  Physical Exam:   Appearance - well kempt   ENMT - no sinus tenderness, no oral exudate, no LAN, Mallampati 3 airway, no stridor, clear nasal drainage  Respiratory - equal breath sounds bilaterally, no wheezing or rales  CV - s1s2 regular rate and rhythm, no murmurs  Ext - no clubbing, no edema  Skin - no rashes  Psych - normal mood and affect    Sleep Tests:  PSG 12/14/15 >> AHI 50.5, SpO2 low 74% Auto CPAP 05/07/20 to 06/05/20 >> used on 30 of 30 nights with average 8 hrs 19 min.  Average AHI 4.6 with median CPAP 13 and 95 th percentile CPAP 16 cm H2O.  Some air leak.  Cardiac Tests:  Echo 03/27/16 >> EF 60 to 65%, mild LVH  Social History:  She  reports that she has been  smoking cigarettes. She started smoking about 15 years ago. She has a 4.50 pack-year smoking history. She has never used smokeless tobacco. She reports that she does not currently use alcohol. She reports that she does not use drugs.  Family History:  Her family history includes Brain cancer in her paternal uncle; Cancer in her paternal uncle; Carpal tunnel syndrome in her mother; Cirrhosis in her maternal grandmother; Colon cancer (age of onset: 2) in her father; Diabetes in her maternal grandmother; Fibromyalgia in her mother; Heart attack in her maternal grandfather; Hernia in her father; Hyperlipidemia in her paternal grandmother; Hypertension in her father, maternal grandmother, and paternal grandmother; Liver disease in her father; Lung cancer in her father and paternal grandmother; Ovarian cancer in her maternal grandmother; Throat cancer in her paternal uncle; Thyroid disease in her paternal aunt.     Assessment/Plan:   Obstructive sleep apnea. - she is compliant with CPAP and reports benefit from therapy - she uses [employer] for her DME - will get a copy of her CPAP download and then determine if her settings needed to be increased   Obesity. - she is aware of how  her weight can impact her health   Tobacco abuse. - she is reluctant to try chantix - she remains on bupropion for depression - she will consider trying nicotine patch  Cough with intermittent bronchospasm. 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 - followed by [facility]  Time Spent Involved in Patient Care on Day of Examination:  32 minutes  Follow up:   Patient Instructions  Will call with results of CPAP download and make adjustments to set up as needed  Will arrange for home nebulizer machine and you can use albuterol every 6 hours as needed for cough, wheeze, chest  congestion, or shortness of breath  Follow up in 1 year  Medication List:   Allergies as of 12/17/2021   No Known Allergies      Medication List        Accurate as of December 17, 2021 11:04 AM. If you have any questions, ask your nurse or doctor.          Accu-Chek Guide test strip Generic drug: glucose blood Test BS up to 4 times daily as directed Dx E11.9   Accu-Chek Softclix Lancets lancets Test BS up to 4 times daily as directed Dx E11.9   albuterol 108 (90 Base) MCG/ACT inhaler Commonly known as: VENTOLIN HFA Inhale 2 puffs into the lungs every 6 (six) hours as needed for wheezing or shortness of breath. What changed: Another medication with the same name was added. Make sure you understand how and when to take each. Changed by: Chesley Mires, MD   albuterol (2.5 MG/3ML) 0.083% nebulizer solution Commonly known as: PROVENTIL Take 3 mLs (2.5 mg total) by nebulization every 6 (six) hours as needed for wheezing or shortness of breath. What changed: You were already taking a medication with the same name, and this prescription was added. Make sure you understand how and when to take each. Changed by: Chesley Mires, MD   amoxicillin-clavulanate 875-125 MG tablet Commonly known as: AUGMENTIN Take 1 tablet by mouth 2 (two) times daily.   atorvastatin 20 MG tablet Commonly known as: LIPITOR Take 1 tablet (20 mg total) by mouth daily.   blood glucose meter kit and supplies Dispense based on patient and insurance preference. Use up to four times daily as directed. (FOR ICD-10 E10.9, E11.9).   buPROPion 300 MG 24 hr tablet Commonly known as: WELLBUTRIN XL Take 1 tablet (300 mg total) by mouth every morning.   cyclobenzaprine 10 MG tablet Commonly known as: FLEXERIL Take 1 tablet by mouth three times daily as needed for muscle spasm   diclofenac 75 MG EC tablet Commonly known as: VOLTAREN Take 1 tablet by mouth twice daily   fexofenadine 180 MG tablet Commonly  known as: ALLEGRA Take 1 tablet by mouth once daily   fluticasone 50 MCG/ACT nasal spray Commonly known as: FLONASE Place 2 sprays into both nostrils daily.   gabapentin 300 MG capsule Commonly known as: NEURONTIN Take 1 capsule (300 mg total) by mouth 3 (three) times daily.   hydrochlorothiazide 12.5 MG tablet Commonly known as: HYDRODIURIL Take 1 tablet (12.5 mg total) by mouth daily.   Jardiance 10 MG Tabs tablet Generic drug: empagliflozin Take 1 tablet by mouth once daily   metFORMIN 500 MG 24 hr tablet Commonly known as: GLUCOPHAGE-XR TAKE 2 TABLETS BY MOUTH ONCE DAILY WITH BREAKFAST   metoprolol succinate 25 MG 24 hr  tablet Commonly known as: TOPROL-XL TAKE 1 TABLET BY MOUTH ONCE DAILY WITH MEALS OR  IMMEDIATELY  FOLLOWING   multivitamin with minerals Tabs tablet Take 1 tablet by mouth daily. womens   norethindrone 0.35 MG tablet Commonly known as: MICRONOR Take 1 tablet by mouth once daily   nystatin powder Commonly known as: MYCOSTATIN/NYSTOP Apply 1 application topically 2 (two) times daily.   omeprazole 40 MG capsule Commonly known as: PRILOSEC Take 1 capsule (40 mg total) by mouth daily.   oxyCODONE 5 MG immediate release tablet Commonly known as: Roxicodone Take 1 tablet (5 mg total) by mouth every 12 (twelve) hours as needed for severe pain.   podofilox 0.5 % gel Commonly known as: CONDYLOX Apply topically 2 (two) times daily.   predniSONE 10 MG (21) Tbpk tablet Commonly known as: STERAPRED UNI-PAK 21 TAB Use as directed   PROBIOTIC DAILY PO Take 1 capsule by mouth daily. Women's care   silver sulfADIAZINE 1 % cream Commonly known as: Silvadene Apply 1 application topically 2 (two) times daily.   spironolactone 100 MG tablet Commonly known as: ALDACTONE Take 1 tablet by mouth once daily   Trulicity 1.5 [id number]/0.9WJ Sopn Generic drug: Dulaglutide INJECT 1 DOSE SUBCUTANEOUSLY ONCE A WEEK   ursodiol 500 MG tablet Commonly known as:  ACTIGALL TAKE 2 TABLETS BY MOUTH TWICE DAILY WITH A MEAL   venlafaxine XR 75 MG 24 hr capsule Commonly known as: EFFEXOR-XR Take 1 capsule (75 mg total) by mouth daily.        Signature:  Chesley Mires, MD [place] Pager - [phone] 12/17/2021, 11:04 AM

## 2021-12-18 DIAGNOSIS — G4733 Obstructive sleep apnea (adult) (pediatric): Secondary | ICD-10-CM | POA: Diagnosis not present

## 2021-12-18 MED ORDER — VENLAFAXINE HCL ER 75 MG PO CP24: 75.0000 mg | ORAL_CAPSULE | Freq: Every day | ORAL | 2 refills | Status: DC

## 2021-12-18 NOTE — Addendum Note (Signed)
Addended by: Ladean Raya on: 12/18/2021 08:19 AM   Modules accepted: Orders

## 2021-12-24 ENCOUNTER — Ambulatory Visit: Payer: Medicaid Other | Admitting: Family

## 2021-12-27 ENCOUNTER — Other Ambulatory Visit: Payer: Self-pay | Admitting: Family

## 2021-12-27 DIAGNOSIS — I10 Essential (primary) hypertension: Secondary | ICD-10-CM

## 2021-12-27 DIAGNOSIS — G8929 Other chronic pain: Secondary | ICD-10-CM

## 2021-12-27 DIAGNOSIS — E1159 Type 2 diabetes mellitus with other circulatory complications: Secondary | ICD-10-CM

## 2022-01-02 DIAGNOSIS — M79642 Pain in left hand: Secondary | ICD-10-CM | POA: Diagnosis not present

## 2022-01-02 DIAGNOSIS — G5603 Carpal tunnel syndrome, bilateral upper limbs: Secondary | ICD-10-CM | POA: Diagnosis not present

## 2022-01-02 DIAGNOSIS — M79641 Pain in right hand: Secondary | ICD-10-CM | POA: Diagnosis not present

## 2022-01-02 DIAGNOSIS — G5622 Lesion of ulnar nerve, left upper limb: Secondary | ICD-10-CM | POA: Diagnosis not present

## 2022-01-14 ENCOUNTER — Other Ambulatory Visit: Payer: Self-pay | Admitting: Family Medicine

## 2022-01-14 DIAGNOSIS — J069 Acute upper respiratory infection, unspecified: Secondary | ICD-10-CM

## 2022-01-14 MED ORDER — AMOXICILLIN-POT CLAVULANATE 875-125 MG PO TABS: 1.0000 | ORAL_TABLET | Freq: Two times a day (BID) | ORAL | 0 refills | Status: AC

## 2022-01-15 ENCOUNTER — Other Ambulatory Visit: Payer: Self-pay | Admitting: Family

## 2022-01-16 ENCOUNTER — Ambulatory Visit: Payer: Medicaid Other | Admitting: Podiatry

## 2022-01-24 ENCOUNTER — Other Ambulatory Visit: Payer: Self-pay | Admitting: Family

## 2022-01-24 DIAGNOSIS — E781 Pure hyperglyceridemia: Secondary | ICD-10-CM

## 2022-01-24 DIAGNOSIS — E1169 Type 2 diabetes mellitus with other specified complication: Secondary | ICD-10-CM

## 2022-01-30 ENCOUNTER — Ambulatory Visit: Payer: Medicaid Other | Admitting: Family

## 2022-02-07 ENCOUNTER — Other Ambulatory Visit: Payer: Self-pay | Admitting: Family

## 2022-02-07 ENCOUNTER — Other Ambulatory Visit: Payer: Self-pay | Admitting: Gastroenterology

## 2022-02-07 DIAGNOSIS — G8929 Other chronic pain: Secondary | ICD-10-CM

## 2022-02-07 DIAGNOSIS — E1169 Type 2 diabetes mellitus with other specified complication: Secondary | ICD-10-CM

## 2022-02-07 DIAGNOSIS — E119 Type 2 diabetes mellitus without complications: Secondary | ICD-10-CM

## 2022-02-08 ENCOUNTER — Other Ambulatory Visit: Payer: Self-pay | Admitting: Family

## 2022-02-08 DIAGNOSIS — G4733 Obstructive sleep apnea (adult) (pediatric): Secondary | ICD-10-CM | POA: Diagnosis not present

## 2022-02-12 NOTE — Telephone Encounter (Signed)
I called pt & made a video appt w/[facility] on this Friday since she has moved to another [place].

## 2022-02-14 ENCOUNTER — Encounter: Payer: Self-pay | Admitting: Family

## 2022-02-14 ENCOUNTER — Telehealth: Payer: Medicaid Other | Admitting: Family

## 2022-02-14 DIAGNOSIS — E781 Pure hyperglyceridemia: Secondary | ICD-10-CM

## 2022-02-14 DIAGNOSIS — G8929 Other chronic pain: Secondary | ICD-10-CM

## 2022-02-14 DIAGNOSIS — F339 Major depressive disorder, recurrent, unspecified: Secondary | ICD-10-CM

## 2022-02-14 DIAGNOSIS — J301 Allergic rhinitis due to pollen: Secondary | ICD-10-CM

## 2022-02-14 DIAGNOSIS — I10 Essential (primary) hypertension: Secondary | ICD-10-CM

## 2022-02-14 DIAGNOSIS — K219 Gastro-esophageal reflux disease without esophagitis: Secondary | ICD-10-CM | POA: Diagnosis not present

## 2022-02-14 DIAGNOSIS — E1169 Type 2 diabetes mellitus with other specified complication: Secondary | ICD-10-CM

## 2022-02-14 DIAGNOSIS — E119 Type 2 diabetes mellitus without complications: Secondary | ICD-10-CM

## 2022-02-14 DIAGNOSIS — G4733 Obstructive sleep apnea (adult) (pediatric): Secondary | ICD-10-CM

## 2022-02-14 DIAGNOSIS — E785 Hyperlipidemia, unspecified: Secondary | ICD-10-CM

## 2022-02-14 DIAGNOSIS — K760 Fatty (change of) liver, not elsewhere classified: Secondary | ICD-10-CM

## 2022-02-14 DIAGNOSIS — F17200 Nicotine dependence, unspecified, uncomplicated: Secondary | ICD-10-CM

## 2022-02-14 DIAGNOSIS — E1159 Type 2 diabetes mellitus with other circulatory complications: Secondary | ICD-10-CM | POA: Diagnosis not present

## 2022-02-14 DIAGNOSIS — M5442 Lumbago with sciatica, left side: Secondary | ICD-10-CM

## 2022-02-14 DIAGNOSIS — F411 Generalized anxiety disorder: Secondary | ICD-10-CM

## 2022-02-14 DIAGNOSIS — M5441 Lumbago with sciatica, right side: Secondary | ICD-10-CM

## 2022-02-14 DIAGNOSIS — I152 Hypertension secondary to endocrine disorders: Secondary | ICD-10-CM

## 2022-02-14 DIAGNOSIS — G6289 Other specified polyneuropathies: Secondary | ICD-10-CM

## 2022-02-14 DIAGNOSIS — Z6841 Body Mass Index (BMI) 40.0 and over, adult: Secondary | ICD-10-CM

## 2022-02-14 DIAGNOSIS — U071 COVID-19: Secondary | ICD-10-CM

## 2022-02-14 MED ORDER — FLUTICASONE PROPIONATE 50 MCG/ACT NA SUSP
2.0000 | Freq: Every day | NASAL | 6 refills | Status: AC
Start: 2022-02-14 — End: ?

## 2022-02-14 MED ORDER — NYSTATIN 100000 UNIT/GM EX POWD
1.0000 | Freq: Two times a day (BID) | CUTANEOUS | 3 refills | Status: AC
Start: 2022-02-14 — End: ?

## 2022-02-14 MED ORDER — FEXOFENADINE HCL 180 MG PO TABS: 180.0000 mg | ORAL_TABLET | Freq: Every day | ORAL | 3 refills | Status: AC

## 2022-02-14 MED ORDER — OXYCODONE HCL 5 MG PO TABS: 5.0000 mg | ORAL_TABLET | Freq: Two times a day (BID) | ORAL | 0 refills | Status: AC | PRN

## 2022-02-14 MED ORDER — METOPROLOL SUCCINATE ER 25 MG PO TB24: ORAL_TABLET | ORAL | 1 refills | Status: AC

## 2022-02-14 MED ORDER — NORETHINDRONE 0.35 MG PO TABS
1.0000 | ORAL_TABLET | Freq: Every day | ORAL | 2 refills | Status: AC
Start: 2022-02-14 — End: ?

## 2022-02-14 MED ORDER — URSODIOL 500 MG PO TABS: ORAL_TABLET | ORAL | 5 refills | Status: AC

## 2022-02-14 MED ORDER — EMPAGLIFLOZIN 10 MG PO TABS: 10.0000 mg | ORAL_TABLET | Freq: Every day | ORAL | 1 refills | Status: AC

## 2022-02-14 MED ORDER — CYCLOBENZAPRINE HCL 10 MG PO TABS: 10.0000 mg | ORAL_TABLET | Freq: Three times a day (TID) | ORAL | 3 refills | Status: AC | PRN

## 2022-02-14 MED ORDER — HYDROCHLOROTHIAZIDE 12.5 MG PO TABS: 12.5000 mg | ORAL_TABLET | Freq: Every day | ORAL | 2 refills | Status: AC

## 2022-02-14 MED ORDER — METFORMIN HCL ER 500 MG PO TB24: 1000.0000 mg | ORAL_TABLET | Freq: Every day | ORAL | 0 refills | Status: AC

## 2022-02-14 MED ORDER — DICLOFENAC SODIUM 75 MG PO TBEC: 75.0000 mg | DELAYED_RELEASE_TABLET | Freq: Two times a day (BID) | ORAL | 1 refills | Status: AC

## 2022-02-14 MED ORDER — VENLAFAXINE HCL ER 75 MG PO CP24: 75.0000 mg | ORAL_CAPSULE | Freq: Every day | ORAL | 2 refills | Status: AC

## 2022-02-14 MED ORDER — SPIRONOLACTONE 100 MG PO TABS: 100.0000 mg | ORAL_TABLET | Freq: Every day | ORAL | 1 refills | Status: AC

## 2022-02-14 MED ORDER — GABAPENTIN 300 MG PO CAPS
300.0000 mg | ORAL_CAPSULE | Freq: Three times a day (TID) | ORAL | 3 refills | Status: AC
Start: 2022-02-14 — End: ?

## 2022-02-14 MED ORDER — ATORVASTATIN CALCIUM 20 MG PO TABS: 20.0000 mg | ORAL_TABLET | Freq: Every day | ORAL | 2 refills | Status: AC

## 2022-02-14 MED ORDER — OMEPRAZOLE 40 MG PO CPDR
40.0000 mg | DELAYED_RELEASE_CAPSULE | Freq: Every day | ORAL | 4 refills | Status: AC
Start: 2022-02-14 — End: ?

## 2022-02-14 MED ORDER — TRULICITY 1.5 MG/0.5ML ~~LOC~~ SOAJ: 1.5000 mg | SUBCUTANEOUS | 2 refills | Status: AC

## 2022-02-14 MED ORDER — BUPROPION HCL ER (XL) 300 MG PO TB24: 300.0000 mg | ORAL_TABLET | Freq: Every morning | ORAL | 1 refills | Status: AC

## 2022-02-14 NOTE — Progress Notes (Signed)
Virtual Visit Consent   Heidi Landry, you are scheduled for a virtual visit with a [place] provider today.     Just as with appointments in the office, your consent must be obtained to participate.  Your consent will be active for this visit and any virtual visit you may have with one of our providers in the next 365 days.     If you have a MyChart account, a copy of this consent can be sent to you electronically.  All virtual visits are billed to your insurance company just like a traditional visit in the office.    As this is a virtual visit, video technology does not allow for your provider to perform a traditional examination.  This may limit your provider's ability to fully assess your condition.  If your provider identifies any concerns that need to be evaluated in person or the need to arrange testing (such as labs, EKG, etc.), we will make arrangements to do so.     Although advances in technology are sophisticated, we cannot ensure that it will always work on either your end or our end.  If the connection with a video visit is poor, the visit may have to be switched to a telephone visit.  With either a video or telephone visit, we are not always able to ensure that we have a secure connection.     I need to obtain your verbal consent now.   Are you willing to proceed with your visit today?    [name] has provided verbal consent on 02/14/2022 for a virtual visit (video or telephone).   Evelina Dun, FNP   Date: 02/14/2022 9:16 AM   Virtual Visit via Video Note   I, Evelina Dun, connected with  Heidi Landry  ([id number], [birth year]) on 02/14/22 at  9:10 AM EST by a video-enabled telemedicine application and verified that I am speaking with the correct person using two identifiers.  Location: Patient: Virtual Visit Location Patient: Home Provider: Virtual Visit Location Provider: Office/Clinic   I discussed the limitations of evaluation and management by  telemedicine and the availability of in person appointments. The patient expressed understanding and agreed to proceed.    History of Present Illness: Heidi Landry is a 36 y.o. who identifies as a female who was assigned female at birth, and is being seen today for  for chronic follow up. She has moved to [place] and needs her medication refilled. She is followed by GI every 3 months for NASH. She is followed by [facility] for GAD and Depression   She is also complaining of generalized nerve pain in feet, legs, buttocks, chest, and arms of a 7 out 10. She reports she has seen a neurologists in the past and has a follow up scheduled.   She has OSA and uses a CPAP nightly.    HPI: Hypertension This is a chronic problem. The current episode started more than 1 year ago. The problem has been resolved since onset. The problem is controlled. Associated symptoms include anxiety and malaise/fatigue. Pertinent negatives include no blurred vision, peripheral edema or shortness of breath. Risk factors for coronary artery disease include dyslipidemia, diabetes mellitus, obesity and sedentary lifestyle. The current treatment provides moderate improvement.  Diabetes She presents for her follow-up diabetic visit. She has type 2 diabetes mellitus. Hypoglycemia symptoms include nervousness/anxiousness. Associated symptoms include foot paresthesias. Pertinent negatives for diabetes include no blurred vision. Symptoms are stable. Diabetic complications include heart disease and peripheral neuropathy. Risk  factors for coronary artery disease include dyslipidemia, diabetes mellitus, hypertension and sedentary lifestyle. She is following a generally unhealthy diet. (Does not check BS at home )  Hyperlipidemia This is a chronic problem. The current episode started more than 1 year ago. Exacerbating diseases include obesity. Pertinent negatives include no shortness of breath. Current antihyperlipidemic treatment  includes statins. The current treatment provides moderate improvement of lipids. Risk factors for coronary artery disease include dyslipidemia, diabetes mellitus, hypertension, a sedentary lifestyle and post-menopausal.  Back Pain This is a chronic problem. The current episode started more than 1 year ago. The problem is unchanged. The pain is present in the lumbar spine. The quality of the pain is described as aching. The pain is at a severity of 7/10. The pain is moderate. The symptoms are aggravated by bending. Risk factors include obesity. She has tried bed rest and muscle relaxant for the symptoms. The treatment provided mild relief.  Gastroesophageal Reflux She complains of belching and heartburn. This is a chronic problem. The current episode started more than 1 year ago. The problem occurs occasionally. The treatment provided moderate relief.  Anxiety Presents for follow-up visit. Symptoms include depressed mood, excessive worry, irritability, nervous/anxious behavior and restlessness. Patient reports no shortness of breath. Symptoms occur most days. The severity of symptoms is moderate.    Depression        This is a chronic problem.  The current episode started more than 1 year ago.   The onset quality is gradual.   The problem occurs intermittently.  Associated symptoms include helplessness, hopelessness and restlessness.  Past medical history includes anxiety.    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   Carpal tunnel syndrome    Hyperlipidemia associated with type 2 diabetes mellitus ([place]) 01/30/2020   Tension headache 10/28/2019   Depression, recurrent ([place]) 08/11/2019   Type 2 diabetes mellitus with hypertriglyceridemia ([place]) 03/08/2019   Leukocytosis 03/08/2019   NAFLD (nonalcoholic fatty liver disease) 12/09/2018    Transaminitis 12/09/2018   OSA (obstructive sleep apnea) 10/27/2018   Degeneration of lumbar intervertebral disc 03/13/2018   Chronic left-sided low back pain with sciatica 03/13/2018   Allergic rhinitis 04/23/2017   IBS (irritable bowel syndrome) 10/09/2016   Generalized anxiety disorder 07/14/2016   GERD without esophagitis 03/12/2016   Hypertension associated with type 2 diabetes mellitus ([place]) 03/12/2016   Rubella non-immune status, antepartum 07/18/2015   Smoker 07/17/2015   Morbid obesity with BMI of 60.0-69.9, adult ([place]) 12/25/2014    Allergies: No Known Allergies Medications:  Current Outpatient Medications:    Accu-Chek Softclix Lancets lancets, Test BS up to 4 times daily as directed Dx E11.9, Disp: 400 each, Rfl: 3   albuterol (PROVENTIL) (2.5 MG/3ML) 0.083% nebulizer solution, Take 3 mLs (2.5 mg total) by nebulization every 6 (six) hours as needed for wheezing or shortness of breath., Disp: 360 mL, Rfl: 5   albuterol (VENTOLIN HFA) 108 (90 Base) MCG/ACT inhaler, Inhale 2 puffs into the lungs every 6 (six) hours as needed for wheezing or shortness of breath., Disp: 8 g, Rfl: 0   atorvastatin (LIPITOR) 20 MG tablet, Take 1 tablet (20 mg total) by mouth daily., Disp: 90 tablet, Rfl: 2   blood glucose meter kit and supplies, Dispense based on patient and insurance preference. Use up to four times daily as directed. (FOR ICD-10 E10.9, E11.9)., Disp: 1 each,  Rfl: 0   buPROPion (WELLBUTRIN XL) 300 MG 24 hr tablet, Take 1 tablet (300 mg total) by mouth every morning., Disp: 90 tablet, Rfl: 1   cyclobenzaprine (FLEXERIL) 10 MG tablet, Take 1 tablet (10 mg total) by mouth 3 (three) times daily as needed. for muscle spams, Disp: 60 tablet, Rfl: 3   diclofenac (VOLTAREN) 75 MG EC tablet, Take 1 tablet (75 mg total) by mouth 2 (two) times daily., Disp: 180 tablet, Rfl: 1   Dulaglutide (TRULICITY) 1.5 ZL/9.3TT SOPN, Inject 1.5 mg into the skin once a week. (NEEDS TO BE SEEN BEFORE NEXT  REFILL), Disp: 6 mL, Rfl: 2   empagliflozin (JARDIANCE) 10 MG TABS tablet, Take 1 tablet (10 mg total) by mouth daily., Disp: 90 tablet, Rfl: 1   fexofenadine (ALLEGRA) 180 MG tablet, Take 1 tablet (180 mg total) by mouth daily., Disp: 90 tablet, Rfl: 3   fluticasone (FLONASE) 50 MCG/ACT nasal spray, Place 2 sprays into both nostrils daily., Disp: 16 g, Rfl: 6   gabapentin (NEURONTIN) 300 MG capsule, Take 1 capsule (300 mg total) by mouth 3 (three) times daily., Disp: 270 capsule, Rfl: 3   glucose blood (ACCU-CHEK GUIDE) test strip, Test BS up to 4 times daily as directed Dx E11.9, Disp: 400 each, Rfl: 3   hydrochlorothiazide (HYDRODIURIL) 12.5 MG tablet, Take 1 tablet (12.5 mg total) by mouth daily., Disp: 90 tablet, Rfl: 2   metFORMIN (GLUCOPHAGE-XR) 500 MG 24 hr tablet, Take 2 tablets (1,000 mg total) by mouth daily with breakfast. (NEEDS TO BE SEEN BEFORE NEXT REFILL), Disp: 60 tablet, Rfl: 0   metoprolol succinate (TOPROL-XL) 25 MG 24 hr tablet, TAKE 1 TABLET BY MOUTH ONCE DAILY WITH MEALS OR  IMMEDIATELY  FOLLOWING, Disp: 90 tablet, Rfl: 1   Multiple Vitamin (MULTIVITAMIN WITH MINERALS) TABS tablet, Take 1 tablet by mouth daily. womens, Disp: , Rfl:    norethindrone (MICRONOR) 0.35 MG tablet, Take 1 tablet (0.35 mg total) by mouth daily., Disp: 90 tablet, Rfl: 2   nystatin (MYCOSTATIN/NYSTOP) powder, Apply 1 application topically 2 (two) times daily., Disp: 60 g, Rfl: 3   omeprazole (PRILOSEC) 40 MG capsule, Take 1 capsule (40 mg total) by mouth daily., Disp: 90 capsule, Rfl: 4   oxyCODONE (ROXICODONE) 5 MG immediate release tablet, Take 1 tablet (5 mg total) by mouth every 12 (twelve) hours as needed for severe pain., Disp: 60 tablet, Rfl: 0   Probiotic Product (PROBIOTIC DAILY PO), Take 1 capsule by mouth daily. Women's care, Disp: , Rfl:    silver sulfADIAZINE (SILVADENE) 1 % cream, Apply 1 application topically 2 (two) times daily., Disp: 50 g, Rfl: 0   spironolactone (ALDACTONE) 100 MG  tablet, Take 1 tablet (100 mg total) by mouth daily., Disp: 90 tablet, Rfl: 1   ursodiol (ACTIGALL) 500 MG tablet, TAKE 2 TABLETS BY MOUTH TWICE DAILY WITH A MEAL, Disp: 120 tablet, Rfl: 5   venlafaxine XR (EFFEXOR-XR) 75 MG 24 hr capsule, Take 1 capsule (75 mg total) by mouth daily., Disp: 90 capsule, Rfl: 2  Observations/Objective: Patient is well-developed, well-nourished in no acute distress.  Resting comfortably  at home.  Head is normocephalic, atraumatic.  No labored breathing.  Speech is clear and coherent with logical content.  Patient is alert and oriented at baseline.  No pain, and doing well  Assessment and Plan: 1. Hypertension associated with type 2 diabetes mellitus (HCC) - hydrochlorothiazide (HYDRODIURIL) 12.5 MG tablet; Take 1 tablet (12.5 mg total) by mouth daily.  Dispense:  90 tablet; Refill: 2 - metoprolol succinate (TOPROL-XL) 25 MG 24 hr tablet; TAKE 1 TABLET BY MOUTH ONCE DAILY WITH MEALS OR  IMMEDIATELY  FOLLOWING  Dispense: 90 tablet; Refill: 1 - spironolactone (ALDACTONE) 100 MG tablet; Take 1 tablet (100 mg total) by mouth daily.  Dispense: 90 tablet; Refill: 1  2. OSA (obstructive sleep apnea)  3. GERD without esophagitis - omeprazole (PRILOSEC) 40 MG capsule; Take 1 capsule (40 mg total) by mouth daily.  Dispense: 90 capsule; Refill: 4  4. NAFLD (nonalcoholic fatty liver disease)  5. 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  Dispense: 90 tablet; Refill: 2 - metFORMIN (GLUCOPHAGE-XR) 500 MG 24 hr tablet; Take 2 tablets (1,000 mg total) by mouth daily with breakfast. (NEEDS TO BE SEEN BEFORE NEXT REFILL)  Dispense: 60 tablet; Refill: 0  6. Hyperlipidemia associated with type 2 diabetes mellitus (HCC)  7. Other polyneuropathy  8. Depression, recurrent (HCC) - buPROPion (WELLBUTRIN XL) 300 MG 24 hr tablet; Take 1 tablet (300 mg total) by mouth every morning.   Dispense: 90 tablet; Refill: 1 - venlafaxine XR (EFFEXOR-XR) 75 MG 24 hr capsule; Take 1 capsule (75 mg total) by mouth daily.  Dispense: 90 capsule; Refill: 2  9. Generalized anxiety disorder  10. Morbid obesity with BMI of 60.0-69.9, adult ([place])  11. Smoker  12. GAD (generalized anxiety disorder) - buPROPion (WELLBUTRIN XL) 300 MG 24 hr tablet; Take 1 tablet (300 mg total) by mouth every morning.  Dispense: 90 tablet; Refill: 1 - venlafaxine XR (EFFEXOR-XR) 75 MG 24 hr capsule; Take 1 capsule (75 mg total) by mouth daily.  Dispense: 90 capsule; Refill: 2  13. Chronic bilateral low back pain with bilateral sciatica - diclofenac (VOLTAREN) 75 MG EC tablet; Take 1 tablet (75 mg total) by mouth 2 (two) times daily.  Dispense: 180 tablet; Refill: 1  14. Allergic rhinitis due to pollen, unspecified seasonality - fexofenadine (ALLEGRA) 180 MG tablet; Take 1 tablet (180 mg total) by mouth daily.  Dispense: 90 tablet; Refill: 3  15. Essential hypertension - hydrochlorothiazide (HYDRODIURIL) 12.5 MG tablet; Take 1 tablet (12.5 mg total) by mouth daily.  Dispense: 90 tablet; Refill: 2 - metoprolol succinate (TOPROL-XL) 25 MG 24 hr tablet; TAKE 1 TABLET BY MOUTH ONCE DAILY WITH MEALS OR  IMMEDIATELY  FOLLOWING  Dispense: 90 tablet; Refill: 1 - spironolactone (ALDACTONE) 100 MG tablet; Take 1 tablet (100 mg total) by mouth daily.  Dispense: 90 tablet; Refill: 1  16. COVID-19 virus detected - fluticasone (FLONASE) 50 MCG/ACT nasal spray; Place 2 sprays into both nostrils daily.  Dispense: 16 g; Refill: 6  17. Type 2 diabetes mellitus without complication, without long-term current use of insulin (HCC) - metFORMIN (GLUCOPHAGE-XR) 500 MG 24 hr tablet; Take 2 tablets (1,000 mg total) by mouth daily with breakfast. (NEEDS TO BE SEEN BEFORE NEXT REFILL)  Dispense: 60 tablet; Refill: 0  Recommend calling and finding a new PCP as soon as possible given wait. She also needs to see get specialists in  [place].  Continue medications Encourage healthy diet and exercise Encourage weight loss  Follow Up Instructions: I discussed the assessment and treatment plan with the patient. The patient was provided an opportunity to ask questions and all were answered. The patient agreed with the plan and demonstrated an understanding of the instructions.  A copy of instructions were sent to the patient via MyChart unless otherwise noted below.  The patient was advised to call back or seek an in-person evaluation if the symptoms worsen or if the condition fails to improve as anticipated.  Time:  I spent 24 minutes with the patient via telehealth technology discussing the above problems/concerns.    Evelina Dun, FNP

## 2022-02-18 ENCOUNTER — Telehealth: Payer: Medicaid Other | Admitting: Adult Health

## 2022-02-18 ENCOUNTER — Telehealth: Payer: Self-pay

## 2022-02-18 NOTE — Telephone Encounter (Signed)
Sherilyn Banker Key: [id number] - PA Case ID: [id number] - Rx #: [id number] Need help? Call us at [phone] Status Sent to [facility] oxyCODONE HCl 5MG tablets Form [facility] [place] Form [phone] NCPDP)

## 2022-02-19 NOTE — Telephone Encounter (Signed)
[facility] PA Case: [id number], Status: Denied. Notification: Completed.

## 2022-03-13 ENCOUNTER — Other Ambulatory Visit: Payer: Self-pay | Admitting: Family
